# Patient Record
Sex: Female | Born: 1950 | Race: White | Hispanic: No | Smoking: Never smoker
Health system: Southern US, Community
[De-identification: ages and names within clinical notes are randomized; demographics above are authoritative.]

## PROBLEM LIST (undated history)

## (undated) ENCOUNTER — Emergency Department: Payer: Self-pay | Source: Ambulatory Visit | Admitting: Emergency Medicine

## (undated) DIAGNOSIS — E559 Vitamin D deficiency, unspecified: Secondary | ICD-10-CM

## (undated) DIAGNOSIS — F419 Anxiety disorder, unspecified: Secondary | ICD-10-CM

## (undated) DIAGNOSIS — G4730 Sleep apnea, unspecified: Secondary | ICD-10-CM

## (undated) DIAGNOSIS — S82892A Other fracture of left lower leg, initial encounter for closed fracture: Secondary | ICD-10-CM

## (undated) DIAGNOSIS — R7989 Other specified abnormal findings of blood chemistry: Secondary | ICD-10-CM

## (undated) DIAGNOSIS — G43909 Migraine, unspecified, not intractable, without status migrainosus: Secondary | ICD-10-CM

## (undated) DIAGNOSIS — Z8719 Personal history of other diseases of the digestive system: Secondary | ICD-10-CM

## (undated) DIAGNOSIS — I499 Cardiac arrhythmia, unspecified: Secondary | ICD-10-CM

## (undated) DIAGNOSIS — N390 Urinary tract infection, site not specified: Secondary | ICD-10-CM

## (undated) DIAGNOSIS — I10 Essential (primary) hypertension: Secondary | ICD-10-CM

## (undated) DIAGNOSIS — K760 Fatty (change of) liver, not elsewhere classified: Secondary | ICD-10-CM

## (undated) DIAGNOSIS — F329 Major depressive disorder, single episode, unspecified: Secondary | ICD-10-CM

## (undated) DIAGNOSIS — D649 Anemia, unspecified: Secondary | ICD-10-CM

## (undated) DIAGNOSIS — Z5189 Encounter for other specified aftercare: Secondary | ICD-10-CM

## (undated) DIAGNOSIS — I341 Nonrheumatic mitral (valve) prolapse: Secondary | ICD-10-CM

## (undated) DIAGNOSIS — J45909 Unspecified asthma, uncomplicated: Secondary | ICD-10-CM

## (undated) DIAGNOSIS — F32A Depression, unspecified: Secondary | ICD-10-CM

## (undated) DIAGNOSIS — R079 Chest pain, unspecified: Secondary | ICD-10-CM

## (undated) DIAGNOSIS — M1990 Unspecified osteoarthritis, unspecified site: Secondary | ICD-10-CM

## (undated) DIAGNOSIS — Z973 Presence of spectacles and contact lenses: Secondary | ICD-10-CM

## (undated) DIAGNOSIS — T8092XA Unspecified transfusion reaction, initial encounter: Secondary | ICD-10-CM

## (undated) DIAGNOSIS — M5126 Other intervertebral disc displacement, lumbar region: Secondary | ICD-10-CM

## (undated) DIAGNOSIS — R112 Nausea with vomiting, unspecified: Secondary | ICD-10-CM

## (undated) DIAGNOSIS — T7840XA Allergy, unspecified, initial encounter: Secondary | ICD-10-CM

## (undated) DIAGNOSIS — R002 Palpitations: Secondary | ICD-10-CM

## (undated) DIAGNOSIS — M2550 Pain in unspecified joint: Secondary | ICD-10-CM

## (undated) DIAGNOSIS — R0602 Shortness of breath: Secondary | ICD-10-CM

## (undated) DIAGNOSIS — J449 Chronic obstructive pulmonary disease, unspecified: Secondary | ICD-10-CM

## (undated) DIAGNOSIS — I671 Cerebral aneurysm, nonruptured: Secondary | ICD-10-CM

## (undated) DIAGNOSIS — Z9889 Other specified postprocedural states: Secondary | ICD-10-CM

## (undated) DIAGNOSIS — E538 Deficiency of other specified B group vitamins: Secondary | ICD-10-CM

## (undated) DIAGNOSIS — G4733 Obstructive sleep apnea (adult) (pediatric): Secondary | ICD-10-CM

## (undated) DIAGNOSIS — E785 Hyperlipidemia, unspecified: Secondary | ICD-10-CM

## (undated) DIAGNOSIS — R51 Headache: Secondary | ICD-10-CM

## (undated) DIAGNOSIS — R42 Dizziness and giddiness: Secondary | ICD-10-CM

## (undated) DIAGNOSIS — K219 Gastro-esophageal reflux disease without esophagitis: Secondary | ICD-10-CM

## (undated) DIAGNOSIS — K8020 Calculus of gallbladder without cholecystitis without obstruction: Secondary | ICD-10-CM

## (undated) DIAGNOSIS — I2510 Atherosclerotic heart disease of native coronary artery without angina pectoris: Secondary | ICD-10-CM

## (undated) DIAGNOSIS — M549 Dorsalgia, unspecified: Secondary | ICD-10-CM

## (undated) HISTORY — DX: Dorsalgia, unspecified: M54.9

## (undated) HISTORY — DX: Urinary tract infection, site not specified: N39.0

## (undated) HISTORY — DX: Gastro-esophageal reflux disease without esophagitis: K21.9

## (undated) HISTORY — DX: Unspecified osteoarthritis, unspecified site: M19.90

## (undated) HISTORY — DX: Chronic obstructive pulmonary disease, unspecified: J44.9

## (undated) HISTORY — DX: Anemia, unspecified: D64.9

## (undated) HISTORY — DX: Cerebral aneurysm, nonruptured: I67.1

## (undated) HISTORY — DX: Palpitations: R00.2

## (undated) HISTORY — DX: Anxiety disorder, unspecified: F41.9

## (undated) HISTORY — DX: Headache: R51

## (undated) HISTORY — DX: Allergy, unspecified, initial encounter: T78.40XA

## (undated) HISTORY — DX: Pain in unspecified joint: M25.50

## (undated) HISTORY — PX: JOINT REPLACEMENT: SHX530

## (undated) HISTORY — DX: Obstructive sleep apnea (adult) (pediatric): G47.33

## (undated) HISTORY — PX: TONSILLECTOMY AND ADENOIDECTOMY: SUR1326

## (undated) HISTORY — DX: Sleep apnea, unspecified: G47.30

## (undated) HISTORY — DX: Hyperlipidemia, unspecified: E78.5

## (undated) HISTORY — DX: Atherosclerotic heart disease of native coronary artery without angina pectoris: I25.10

## (undated) HISTORY — PX: TOTAL KNEE ARTHROPLASTY: SHX125

## (undated) HISTORY — DX: Essential (primary) hypertension: I10

## (undated) HISTORY — DX: Unspecified transfusion reaction, initial encounter: T80.92XA

## (undated) HISTORY — PX: POLYPECTOMY: SHX149

## (undated) HISTORY — DX: Nonrheumatic mitral (valve) prolapse: I34.1

## (undated) HISTORY — DX: Other specified abnormal findings of blood chemistry: R79.89

## (undated) HISTORY — DX: Encounter for other specified aftercare: Z51.89

## (undated) HISTORY — DX: Shortness of breath: R06.02

## (undated) HISTORY — DX: Unspecified asthma, uncomplicated: J45.909

## (undated) HISTORY — DX: Vitamin D deficiency, unspecified: E55.9

## (undated) HISTORY — DX: Depression, unspecified: F32.A

## (undated) HISTORY — DX: Deficiency of other specified B group vitamins: E53.8

## (undated) HISTORY — PX: CHOLECYSTECTOMY: SHX55

## (undated) HISTORY — DX: Chest pain, unspecified: R07.9

## (undated) HISTORY — DX: Migraine, unspecified, not intractable, without status migrainosus: G43.909

## (undated) HISTORY — PX: COLONOSCOPY: SHX174

---

## 1975-12-31 HISTORY — PX: APPENDECTOMY: SHX54

## 1976-05-30 HISTORY — PX: TUBAL LIGATION: SHX77

## 1978-12-30 HISTORY — PX: PAROTIDECTOMY: SUR1003

## 1998-04-04 ENCOUNTER — Other Ambulatory Visit: Admission: RE | Admit: 1998-04-04 | Discharge: 1998-04-04 | Payer: Self-pay | Admitting: *Deleted

## 1999-04-12 ENCOUNTER — Other Ambulatory Visit: Admission: RE | Admit: 1999-04-12 | Discharge: 1999-04-12 | Payer: Self-pay | Admitting: *Deleted

## 2000-04-23 ENCOUNTER — Other Ambulatory Visit: Admission: RE | Admit: 2000-04-23 | Discharge: 2000-04-23 | Payer: Self-pay | Admitting: *Deleted

## 2001-05-11 ENCOUNTER — Other Ambulatory Visit: Admission: RE | Admit: 2001-05-11 | Discharge: 2001-05-11 | Payer: Self-pay | Admitting: *Deleted

## 2002-07-12 ENCOUNTER — Other Ambulatory Visit: Admission: RE | Admit: 2002-07-12 | Discharge: 2002-07-12 | Payer: Self-pay | Admitting: *Deleted

## 2003-07-20 ENCOUNTER — Other Ambulatory Visit: Admission: RE | Admit: 2003-07-20 | Discharge: 2003-07-20 | Payer: Self-pay | Admitting: *Deleted

## 2004-08-06 ENCOUNTER — Other Ambulatory Visit: Admission: RE | Admit: 2004-08-06 | Discharge: 2004-08-06 | Payer: Self-pay | Admitting: *Deleted

## 2005-11-27 ENCOUNTER — Other Ambulatory Visit: Admission: RE | Admit: 2005-11-27 | Discharge: 2005-11-27 | Payer: Self-pay | Admitting: *Deleted

## 2007-10-07 ENCOUNTER — Other Ambulatory Visit: Admission: RE | Admit: 2007-10-07 | Discharge: 2007-10-07 | Payer: Self-pay | Admitting: *Deleted

## 2008-05-26 ENCOUNTER — Ambulatory Visit: Payer: Self-pay

## 2008-06-14 ENCOUNTER — Ambulatory Visit: Payer: Self-pay | Admitting: *Deleted

## 2008-06-22 ENCOUNTER — Ambulatory Visit: Payer: Self-pay | Admitting: Gynecology

## 2009-06-12 ENCOUNTER — Ambulatory Visit: Payer: Self-pay | Admitting: Specialist

## 2009-10-17 ENCOUNTER — Ambulatory Visit: Payer: Self-pay | Admitting: Gynecology

## 2009-11-22 ENCOUNTER — Ambulatory Visit: Payer: Self-pay | Admitting: General Practice

## 2010-02-09 ENCOUNTER — Ambulatory Visit: Payer: Self-pay | Admitting: Unknown Physician Specialty

## 2010-07-09 LAB — HM COLONOSCOPY

## 2010-11-07 ENCOUNTER — Ambulatory Visit: Payer: Self-pay | Admitting: Gynecology

## 2011-01-22 ENCOUNTER — Ambulatory Visit: Payer: Self-pay | Admitting: Urology

## 2011-04-26 ENCOUNTER — Ambulatory Visit: Payer: Self-pay | Admitting: Unknown Physician Specialty

## 2011-08-26 ENCOUNTER — Ambulatory Visit: Payer: Self-pay | Admitting: General Practice

## 2011-08-31 ENCOUNTER — Ambulatory Visit: Payer: Self-pay | Admitting: Internal Medicine

## 2011-09-19 ENCOUNTER — Ambulatory Visit: Payer: Self-pay | Admitting: Internal Medicine

## 2011-09-30 ENCOUNTER — Ambulatory Visit: Payer: Self-pay | Admitting: Internal Medicine

## 2011-09-30 HISTORY — PX: TOTAL KNEE ARTHROPLASTY: SHX125

## 2011-10-09 ENCOUNTER — Inpatient Hospital Stay: Payer: Self-pay | Admitting: General Practice

## 2011-10-25 ENCOUNTER — Encounter: Payer: Self-pay | Admitting: General Practice

## 2011-10-30 ENCOUNTER — Other Ambulatory Visit: Payer: Self-pay | Admitting: Unknown Physician Specialty

## 2011-10-31 ENCOUNTER — Encounter: Payer: Self-pay | Admitting: General Practice

## 2011-12-06 ENCOUNTER — Ambulatory Visit: Payer: Self-pay | Admitting: Unknown Physician Specialty

## 2011-12-18 ENCOUNTER — Other Ambulatory Visit: Payer: Self-pay | Admitting: Unknown Physician Specialty

## 2012-01-21 ENCOUNTER — Ambulatory Visit: Payer: Self-pay | Admitting: Internal Medicine

## 2012-01-31 ENCOUNTER — Ambulatory Visit: Payer: Self-pay | Admitting: Internal Medicine

## 2012-02-10 ENCOUNTER — Ambulatory Visit: Payer: Self-pay | Admitting: Gynecology

## 2012-02-10 LAB — HM MAMMOGRAPHY: HM Mammogram: NORMAL

## 2012-02-28 ENCOUNTER — Other Ambulatory Visit: Payer: Self-pay | Admitting: Internal Medicine

## 2012-02-28 LAB — BASIC METABOLIC PANEL
Anion Gap: 8 (ref 7–16)
BUN: 16 mg/dL (ref 7–18)
Calcium, Total: 9.4 mg/dL (ref 8.5–10.1)
Chloride: 101 mmol/L (ref 98–107)
Co2: 30 mmol/L (ref 21–32)
Creatinine: 0.7 mg/dL (ref 0.60–1.30)
EGFR (African American): >60
EGFR (Non-African Amer.): >60
Glucose: 103 mg/dL — ABNORMAL HIGH (ref 65–99)
Osmolality: 279 (ref 275–301)
Potassium: 4 mmol/L (ref 3.5–5.1)
Sodium: 139 mmol/L (ref 136–145)

## 2012-02-28 LAB — RENAL FUNCTION PANEL
Albumin: 3.6 g/dL (ref 3.4–5.0)
Phosphorus: 3.5 mg/dL (ref 2.5–4.9)

## 2012-06-15 ENCOUNTER — Emergency Department: Payer: Self-pay | Admitting: *Deleted

## 2012-06-15 LAB — COMPREHENSIVE METABOLIC PANEL
Albumin: 4 g/dL (ref 3.4–5.0)
Alkaline Phosphatase: 56 U/L (ref 50–136)
Anion Gap: 9 (ref 7–16)
BUN: 21 mg/dL — ABNORMAL HIGH (ref 7–18)
Bilirubin,Total: 0.8 mg/dL (ref 0.2–1.0)
Calcium, Total: 9.7 mg/dL (ref 8.5–10.1)
Chloride: 102 mmol/L (ref 98–107)
Co2: 27 mmol/L (ref 21–32)
Creatinine: 0.66 mg/dL (ref 0.60–1.30)
EGFR (African American): >60
EGFR (Non-African Amer.): >60
Glucose: 142 mg/dL — ABNORMAL HIGH (ref 65–99)
Osmolality: 281 (ref 275–301)
Potassium: 4.1 mmol/L (ref 3.5–5.1)
SGOT(AST): 38 U/L — ABNORMAL HIGH (ref 15–37)
SGPT (ALT): 40 U/L
Sodium: 138 mmol/L (ref 136–145)
Total Protein: 7.7 g/dL (ref 6.4–8.2)

## 2012-06-15 LAB — URINALYSIS, COMPLETE
Bilirubin,UR: NEGATIVE
Glucose,UR: NEGATIVE mg/dL (ref 0–75)
Ketone: NEGATIVE
Nitrite: POSITIVE
Ph: 6 (ref 4.5–8.0)
Protein: 30
RBC,UR: 15 /HPF (ref 0–5)
Specific Gravity: 1.021 (ref 1.003–1.030)
Squamous Epithelial: 1
WBC UR: 50 /HPF (ref 0–5)

## 2012-06-15 LAB — CBC
HCT: 36.2 % (ref 35.0–47.0)
HGB: 11.6 g/dL — ABNORMAL LOW (ref 12.0–16.0)
MCH: 29.2 pg (ref 26.0–34.0)
MCHC: 31.9 g/dL — ABNORMAL LOW (ref 32.0–36.0)
MCV: 92 fL (ref 80–100)
Platelet: 275 10*3/uL (ref 150–440)
RBC: 3.96 10*6/uL (ref 3.80–5.20)
RDW: 15.2 % — ABNORMAL HIGH (ref 11.5–14.5)
WBC: 9.1 10*3/uL (ref 3.6–11.0)

## 2012-06-15 LAB — HEMOGLOBIN A1C: Hemoglobin A1C: 7 % — ABNORMAL HIGH (ref 4.2–6.3)

## 2012-07-09 ENCOUNTER — Encounter: Payer: Self-pay | Admitting: Internal Medicine

## 2012-07-09 ENCOUNTER — Ambulatory Visit (INDEPENDENT_AMBULATORY_CARE_PROVIDER_SITE_OTHER): Payer: PRIVATE HEALTH INSURANCE | Admitting: Internal Medicine

## 2012-07-09 VITALS — BP 140/72 | HR 81 | Temp 98.1°F | Ht 65.0 in | Wt 222.2 lb

## 2012-07-09 DIAGNOSIS — E785 Hyperlipidemia, unspecified: Secondary | ICD-10-CM

## 2012-07-09 DIAGNOSIS — E6609 Other obesity due to excess calories: Secondary | ICD-10-CM | POA: Insufficient documentation

## 2012-07-09 DIAGNOSIS — I10 Essential (primary) hypertension: Secondary | ICD-10-CM

## 2012-07-09 DIAGNOSIS — J45909 Unspecified asthma, uncomplicated: Secondary | ICD-10-CM

## 2012-07-09 DIAGNOSIS — E119 Type 2 diabetes mellitus without complications: Secondary | ICD-10-CM

## 2012-07-09 DIAGNOSIS — E669 Obesity, unspecified: Secondary | ICD-10-CM

## 2012-07-09 LAB — HM PAP SMEAR

## 2012-07-09 NOTE — Assessment & Plan Note (Signed)
Blood sugars well-controlled per patient. Will continue current medications. Will get records from endocrinologist. Encouraged her to continue efforts at healthy diet and regular physical activity.

## 2012-07-09 NOTE — Assessment & Plan Note (Signed)
Symptomatically well-controlled. Will continue Advair and albuterol when necessary.

## 2012-07-09 NOTE — Progress Notes (Signed)
Subjective:    Patient ID: Vanessa Higgins, female    DOB: Jan 05, 1951, 61 y.o.   MRN: [id number]  HPI 61 year old female with history of hypertension, diabetes, and hyperlipidemia presents to establish care. She reports that she is generally feeling well. In regards to her diabetes, she reports typical fasting blood sugars are near 120. She never has blood sugars greater than 200. Her last hemoglobin A1c was 6.9%. She is followed by endocrinology. In regards to hypertension, she reports full compliance with her medications. She denies any headache, chest pain, palpitations.  She does have a history of asthma for which she is followed by pulmonary medicine. She reports that her asthma symptoms have been very well-controlled with the use of Advair. She has not had any recent flares.  Outpatient Encounter Prescriptions as of 07/09/2012  Medication Sig Dispense Refill  . aspirin 81 MG tablet Take 81 mg by mouth daily.      Marland Kitchen b complex vitamins tablet Take 1 tablet by mouth daily.      . Exenatide (BYDUREON) 2 MG SUSR Inject 2 mg into the skin once a week.      . ezetimibe-simvastatin (VYTORIN) 10-20 MG per tablet Take 1 tablet by mouth at bedtime.      . Fluticasone-Salmeterol (ADVAIR) 250-50 MCG/DOSE AEPB Inhale 1 puff into the lungs 2 (two) times daily.      Marland Kitchen loratadine (CLARITIN) 10 MG tablet Take 10 mg by mouth daily.      . metFORMIN (GLUCOPHAGE) 1000 MG tablet Take 1,000 mg by mouth 2 (two) times daily with a meal.      . montelukast (SINGULAIR) 10 MG tablet Take 10 mg by mouth daily.      . Multiple Vitamins-Minerals (CENTRUM SILVER PO) Take 1 tablet by mouth daily.      . Omega-3 Fatty Acids (FISH OIL) 1200 MG CAPS Take 1 capsule by mouth daily.      Marland Kitchen omeprazole (PRILOSEC) 20 MG capsule Take 20 mg by mouth daily.      Marland Kitchen telmisartan-hydrochlorothiazide (MICARDIS HCT) 80-12.5 MG per tablet Take 1 tablet by mouth daily.      Marland Kitchen venlafaxine XR (EFFEXOR-XR) 150 MG 24 hr capsule Take 150 mg by mouth  daily.      . Verapamil HCl CR 300 MG CP24 Take 1 capsule by mouth at bedtime.        Review of Systems  Constitutional: Negative for fever, chills, appetite change, fatigue and unexpected weight change.  HENT: Negative for ear pain, congestion, sore throat, trouble swallowing, neck pain, voice change and sinus pressure.   Eyes: Negative for visual disturbance.  Respiratory: Negative for cough, shortness of breath, wheezing and stridor.   Cardiovascular: Negative for chest pain, palpitations and leg swelling.  Gastrointestinal: Negative for nausea, vomiting, abdominal pain, diarrhea, constipation, blood in stool, abdominal distention and anal bleeding.  Genitourinary: Negative for dysuria and flank pain.  Musculoskeletal: Negative for myalgias, arthralgias and gait problem.  Skin: Negative for color change and rash.  Neurological: Negative for dizziness and headaches.  Hematological: Negative for adenopathy. Does not bruise/bleed easily.  Psychiatric/Behavioral: Negative for suicidal ideas, disturbed wake/sleep cycle and dysphoric mood. The patient is not nervous/anxious.    BP 140/72  Pulse 81  Temp 98.1 F (36.7 C) (Oral)  Ht 5\' 5"  (1.651 m)  Wt 222 lb 4 oz (100.812 kg)  BMI 36.98 kg/m2  SpO2 96%     Objective:   Physical Exam  Constitutional: She is oriented to person,  place, and time. She appears well-developed and well-nourished. No distress.  HENT:  Head: Normocephalic and atraumatic.  Right Ear: External ear normal.  Left Ear: External ear normal.  Nose: Nose normal.  Mouth/Throat: Oropharynx is clear and moist. No oropharyngeal exudate.  Eyes: Conjunctivae are normal. Pupils are equal, round, and reactive to light. Right eye exhibits no discharge. Left eye exhibits no discharge. No scleral icterus.  Neck: Normal range of motion. Neck supple. No tracheal deviation present. No thyromegaly present.  Cardiovascular: Normal rate, regular rhythm, normal heart sounds and  intact distal pulses.  Exam reveals no gallop and no friction rub.   No murmur heard. Pulmonary/Chest: Effort normal and breath sounds normal. No respiratory distress. She has no wheezes. She has no rales. She exhibits no tenderness.  Abdominal: Soft. Bowel sounds are normal. She exhibits no distension and no mass. There is no tenderness. There is no guarding.  Musculoskeletal: Normal range of motion. She exhibits no edema and no tenderness.  Lymphadenopathy:    She has no cervical adenopathy.  Neurological: She is alert and oriented to person, place, and time. No cranial nerve deficit. She exhibits normal muscle tone. Coordination normal.  Skin: Skin is warm and dry. No rash noted. She is not diaphoretic. No erythema. No pallor.  Psychiatric: She has a normal mood and affect. Her behavior is normal. Judgment and thought content normal.          Assessment & Plan:

## 2012-07-09 NOTE — Assessment & Plan Note (Signed)
Will get records on recent lipids performed by her endocrinologist.

## 2012-07-09 NOTE — Assessment & Plan Note (Signed)
BMI 36. Encourage patient to continue efforts at healthy diet and regular physical activity. She is currently working with a nutritionist. Followup here in 6 months.

## 2012-07-09 NOTE — Assessment & Plan Note (Signed)
Blood pressure well-controlled on current medications. Will continue current medicines. Will get records on recent blood work including renal function.

## 2012-10-28 ENCOUNTER — Other Ambulatory Visit: Payer: Self-pay | Admitting: Internal Medicine

## 2012-10-28 MED ORDER — VENLAFAXINE HCL ER 150 MG PO CP24: 150.0000 mg | ORAL_CAPSULE | Freq: Every day | ORAL | Status: DC

## 2012-10-28 MED ORDER — MONTELUKAST SODIUM 10 MG PO TABS: 10.0000 mg | ORAL_TABLET | Freq: Every day | ORAL | Status: DC

## 2012-10-28 NOTE — Telephone Encounter (Signed)
Refill request for montelukast 10 mg tab Qty: 90 Sig: take 1 tablet by mouth once a day   Venlafaxine hcl er 150 mg Qty 90 Sig: take 1 capsule by mouth once a day

## 2012-12-28 ENCOUNTER — Other Ambulatory Visit: Payer: Self-pay | Admitting: Internal Medicine

## 2012-12-28 LAB — LIPID PANEL
Cholesterol: 108 mg/dL (ref 0–200)
HDL Cholesterol: 44 mg/dL (ref 40–60)
Ldl Cholesterol, Calc: 47 mg/dL (ref 0–100)
Triglycerides: 85 mg/dL (ref 0–200)
VLDL Cholesterol, Calc: 17 mg/dL (ref 5–40)

## 2012-12-28 LAB — BASIC METABOLIC PANEL
Anion Gap: 9 (ref 7–16)
BUN: 16 mg/dL (ref 7–18)
Calcium, Total: 9.1 mg/dL (ref 8.5–10.1)
Chloride: 102 mmol/L (ref 98–107)
Co2: 27 mmol/L (ref 21–32)
Creatinine: 0.64 mg/dL (ref 0.60–1.30)
EGFR (African American): >60
EGFR (Non-African Amer.): >60
Glucose: 128 mg/dL — ABNORMAL HIGH (ref 65–99)
Osmolality: 279 (ref 275–301)
Potassium: 4.2 mmol/L (ref 3.5–5.1)
Sodium: 138 mmol/L (ref 136–145)

## 2013-02-02 LAB — HM MAMMOGRAPHY: HM Mammogram: NORMAL

## 2013-02-13 ENCOUNTER — Other Ambulatory Visit: Payer: Self-pay

## 2013-03-08 ENCOUNTER — Ambulatory Visit: Payer: Self-pay | Admitting: Gynecology

## 2013-03-08 LAB — HM MAMMOGRAPHY: HM Mammogram: NORMAL

## 2013-04-08 ENCOUNTER — Telehealth: Payer: Self-pay | Admitting: Internal Medicine

## 2013-04-08 NOTE — Telephone Encounter (Signed)
Dr. Tilman Neat patient.  Her A1c has risen slightly from 6.9 to 7.2   She is long overdue for appt to follow up on DM with Dr Dan Humphreys. Make appt.

## 2013-04-09 NOTE — Telephone Encounter (Signed)
Detailed message left on voicemail and advised patient to call office for an appointment.

## 2013-05-03 ENCOUNTER — Ambulatory Visit (INDEPENDENT_AMBULATORY_CARE_PROVIDER_SITE_OTHER): Payer: 59 | Admitting: Internal Medicine

## 2013-05-03 ENCOUNTER — Encounter: Payer: Self-pay | Admitting: Internal Medicine

## 2013-05-03 VITALS — BP 110/70 | HR 80 | Temp 98.3°F | Ht 63.5 in | Wt 222.0 lb

## 2013-05-03 DIAGNOSIS — E785 Hyperlipidemia, unspecified: Secondary | ICD-10-CM

## 2013-05-03 DIAGNOSIS — E669 Obesity, unspecified: Secondary | ICD-10-CM

## 2013-05-03 DIAGNOSIS — L237 Allergic contact dermatitis due to plants, except food: Secondary | ICD-10-CM

## 2013-05-03 DIAGNOSIS — Z0000 Encounter for general adult medical examination without abnormal findings: Secondary | ICD-10-CM

## 2013-05-03 DIAGNOSIS — I10 Essential (primary) hypertension: Secondary | ICD-10-CM

## 2013-05-03 DIAGNOSIS — L255 Unspecified contact dermatitis due to plants, except food: Secondary | ICD-10-CM

## 2013-05-03 DIAGNOSIS — E119 Type 2 diabetes mellitus without complications: Secondary | ICD-10-CM

## 2013-05-03 MED ORDER — ZOSTER VACCINE LIVE 19400 UNT/0.65ML ~~LOC~~ SOLR: 0.6500 mL | Freq: Once | SUBCUTANEOUS | Status: DC

## 2013-05-03 NOTE — Assessment & Plan Note (Signed)
Will check lipids and LFTs with labs. 

## 2013-05-03 NOTE — Assessment & Plan Note (Signed)
Exam is consistent with dermatitis secondary to poison oak exposure over her chest and face. We discussed potentially using oral prednisone, however because she has diabetes we'll hold off and continue topical hydrocortisone. Patient will call if symptoms are not improving.

## 2013-05-03 NOTE — Assessment & Plan Note (Signed)
Reviewed recent hemoglobin A1c which was 7.2%. Encourage continued efforts at healthy diet and regular physical activity. Continue current medication. Followup with endocrinology as scheduled.

## 2013-05-03 NOTE — Progress Notes (Signed)
Subjective:    Patient ID: Vanessa Higgins, female    DOB: [birth year], 62 y.o.   MRN: [id number]  HPI 62 year old female with history of diabetes, hypertension, hyperlipidemia, obesity, asthma presents for annual exam. She has been lost to followup since July 2013. She reports she is generally feeling well. She brings record on hemoglobin A1c from her endocrinologist which was 7.1% in March 2014. She reports compliance with her medication. She denies any low blood sugar or consistent blood sugars greater than 200.  She also brings record of recent mammogram report from March 2014 which was normal. She reports she recently underwent pelvic and Pap smear with her gynecologist which was normal. She is up-to-date on immunizations. She is up-to-date on colonoscopy. She is trying to follow a healthy diet and regular physical activity. She continues to work full-time.  She was recently working in her yard and was exposed to poison oak. She has a red, itchy rash over her anterior chest and face. She has been applying topical hydrocortisone with some improvement.  Outpatient Encounter Prescriptions as of 05/03/2013  Medication Sig Dispense Refill  . aspirin 81 MG tablet Take 81 mg by mouth daily.      Marland Kitchen b complex vitamins tablet Take 1 tablet by mouth daily.      . Exenatide (BYDUREON) 2 MG SUSR Inject 2 mg into the skin once a week.      . ezetimibe-simvastatin (VYTORIN) 10-20 MG per tablet Take 1 tablet by mouth at bedtime.      . Fluticasone-Salmeterol (ADVAIR) 250-50 MCG/DOSE AEPB Inhale 1 puff into the lungs 2 (two) times daily.      Marland Kitchen loratadine (CLARITIN) 10 MG tablet Take 10 mg by mouth daily.      . metFORMIN (GLUCOPHAGE) 1000 MG tablet Take 1,000 mg by mouth 2 (two) times daily with a meal.      . montelukast (SINGULAIR) 10 MG tablet Take 1 tablet (10 mg total) by mouth daily.  30 tablet  11  . Multiple Vitamins-Minerals (CENTRUM SILVER PO) Take 1 tablet by mouth daily.      Marland Kitchen omeprazole  (PRILOSEC) 20 MG capsule Take 20 mg by mouth daily.      Marland Kitchen telmisartan-hydrochlorothiazide (MICARDIS HCT) 80-12.5 MG per tablet Take 1 tablet by mouth daily.      Marland Kitchen venlafaxine XR (EFFEXOR-XR) 150 MG 24 hr capsule Take 1 capsule (150 mg total) by mouth daily.  30 capsule  6  . Verapamil HCl CR 300 MG CP24 Take 1 capsule by mouth at bedtime.      . Omega-3 Fatty Acids (FISH OIL) 1200 MG CAPS Take 1 capsule by mouth daily.      Marland Kitchen zoster vaccine live, PF, (ZOSTAVAX) 14782 UNT/0.65ML injection Inject 19,400 Units into the skin once.  1 each  0   No facility-administered encounter medications on file as of 05/03/2013.   BP 110/70  Pulse 80  Temp(Src) 98.3 F (36.8 C) (Oral)  Ht 5' 3.5" (1.613 m)  Wt 222 lb (100.699 kg)  BMI 38.7 kg/m2  SpO2 94%  Review of Systems  Constitutional: Negative for fever, chills, appetite change, fatigue and unexpected weight change.  HENT: Negative for ear pain, congestion, sore throat, trouble swallowing, neck pain, voice change and sinus pressure.   Eyes: Negative for visual disturbance.  Respiratory: Negative for cough, shortness of breath, wheezing and stridor.   Cardiovascular: Negative for chest pain, palpitations and leg swelling.  Gastrointestinal: Negative for nausea, vomiting, abdominal pain, diarrhea,  constipation, blood in stool, abdominal distention and anal bleeding.  Genitourinary: Negative for dysuria and flank pain.  Musculoskeletal: Negative for myalgias, arthralgias and gait problem.  Skin: Positive for color change and rash.  Neurological: Negative for dizziness and headaches.  Hematological: Negative for adenopathy. Does not bruise/bleed easily.  Psychiatric/Behavioral: Negative for suicidal ideas, sleep disturbance and dysphoric mood. The patient is not nervous/anxious.        Objective:   Physical Exam  Constitutional: She is oriented to person, place, and time. She appears well-developed and well-nourished. No distress.  HENT:  Head:  Normocephalic and atraumatic.  Right Ear: External ear normal.  Left Ear: External ear normal.  Nose: Nose normal.  Mouth/Throat: Oropharynx is clear and moist. No oropharyngeal exudate.  Eyes: Conjunctivae are normal. Pupils are equal, round, and reactive to light. Right eye exhibits no discharge. Left eye exhibits no discharge. No scleral icterus.  Neck: Normal range of motion. Neck supple. No tracheal deviation present. No thyromegaly present.  Cardiovascular: Normal rate, regular rhythm, normal heart sounds and intact distal pulses.  Exam reveals no gallop and no friction rub.   No murmur heard. Pulmonary/Chest: Effort normal and breath sounds normal. No accessory muscle usage. Not tachypneic. No respiratory distress. She has no decreased breath sounds. She has no wheezes. She has no rhonchi. She has no rales. She exhibits no tenderness.  Abdominal: Soft. Bowel sounds are normal. She exhibits no distension. There is no tenderness.  Musculoskeletal: Normal range of motion. She exhibits no edema and no tenderness.  Lymphadenopathy:    She has no cervical adenopathy.  Neurological: She is alert and oriented to person, place, and time. No cranial nerve deficit. She exhibits normal muscle tone. Coordination normal.  Skin: Skin is warm and dry. Rash noted. She is not diaphoretic. There is erythema (erythematous vesicular rash over face and anterior chest). No pallor.  Psychiatric: She has a normal mood and affect. Her behavior is normal. Judgment and thought content normal.          Assessment & Plan:

## 2013-05-03 NOTE — Assessment & Plan Note (Signed)
Wt Readings from Last 3 Encounters:  05/03/13 222 lb (100.699 kg)  07/09/12 222 lb 4 oz (100.812 kg)   Encouraged compliance with healthy diet and regular physical activity.

## 2013-05-03 NOTE — Assessment & Plan Note (Signed)
General medical exam normal today. Breast exam and pelvic exam deferred as recently completed by her OB/GYN. Will request records on Pap smear. Encouraged healthy diet and regular physical activity. Reviewed recent mammogram which was normal. Immunizations are up to date. Colonoscopy is up-to-date. Followup in 6 months or sooner as needed.

## 2013-05-03 NOTE — Assessment & Plan Note (Signed)
BP Readings from Last 3 Encounters:  05/03/13 110/70  07/09/12 140/72   Blood pressure well-controlled on current medication. Will check renal function with labs.

## 2013-05-20 ENCOUNTER — Other Ambulatory Visit: Payer: Self-pay | Admitting: *Deleted

## 2013-05-21 MED ORDER — EXENATIDE ER 2 MG ~~LOC~~ SUSR: 2.0000 mg | SUBCUTANEOUS | Status: DC

## 2013-05-21 MED ORDER — FLUTICASONE-SALMETEROL 250-50 MCG/DOSE IN AEPB: 1.0000 | INHALATION_SPRAY | Freq: Two times a day (BID) | RESPIRATORY_TRACT | Status: DC

## 2013-05-21 MED ORDER — OMEPRAZOLE 20 MG PO CPDR: 20.0000 mg | DELAYED_RELEASE_CAPSULE | Freq: Every day | ORAL | Status: DC

## 2013-05-21 NOTE — Telephone Encounter (Signed)
Eprescribed.

## 2013-05-21 NOTE — Telephone Encounter (Signed)
Recent script 

## 2013-05-21 NOTE — Addendum Note (Signed)
Addended by: Dennie Bible on: 05/21/2013 09:40 AM   Modules accepted: Orders

## 2013-05-31 ENCOUNTER — Other Ambulatory Visit: Payer: Self-pay | Admitting: *Deleted

## 2013-05-31 MED ORDER — VENLAFAXINE HCL ER 150 MG PO CP24: 150.0000 mg | ORAL_CAPSULE | Freq: Every day | ORAL | Status: DC

## 2013-06-28 ENCOUNTER — Other Ambulatory Visit: Payer: Self-pay | Admitting: Internal Medicine

## 2013-06-28 LAB — CBC WITH DIFFERENTIAL/PLATELET
Basophil #: 0 10*3/uL (ref 0.0–0.1)
Basophil %: 0.5 %
Eosinophil #: 0.6 10*3/uL (ref 0.0–0.7)
Eosinophil %: 7.8 %
HCT: 37.7 % (ref 35.0–47.0)
HGB: 12.2 g/dL (ref 12.0–16.0)
Lymphocyte #: 2.6 10*3/uL (ref 1.0–3.6)
Lymphocyte %: 35.3 %
MCH: 28.8 pg (ref 26.0–34.0)
MCHC: 32.3 g/dL (ref 32.0–36.0)
MCV: 89 fL (ref 80–100)
Monocyte #: 0.7 x10 3/mm (ref 0.2–0.9)
Monocyte %: 9 %
Neutrophil #: 3.5 10*3/uL (ref 1.4–6.5)
Neutrophil %: 47.4 %
Platelet: 342 10*3/uL (ref 150–440)
RBC: 4.23 10*6/uL (ref 3.80–5.20)
RDW: 15.4 % — ABNORMAL HIGH (ref 11.5–14.5)
WBC: 7.4 10*3/uL (ref 3.6–11.0)

## 2013-06-28 LAB — LIPID PANEL
Cholesterol: 102 mg/dL (ref 0–200)
HDL Cholesterol: 44 mg/dL (ref 40–60)
Ldl Cholesterol, Calc: 42 mg/dL (ref 0–100)
Triglycerides: 81 mg/dL (ref 0–200)
VLDL Cholesterol, Calc: 16 mg/dL (ref 5–40)

## 2013-06-28 LAB — HEMOGLOBIN A1C: Hemoglobin A1C: 7.2 % — ABNORMAL HIGH (ref 4.2–6.3)

## 2013-06-28 LAB — TSH: Thyroid Stimulating Horm: 1.59 u[IU]/mL

## 2013-06-29 ENCOUNTER — Telehealth: Payer: Self-pay | Admitting: Internal Medicine

## 2013-06-29 NOTE — Telephone Encounter (Signed)
Labs from [facility] show stable blood sugars with A1c 7.2%. I would recommend follow up in 3 months.

## 2013-06-30 NOTE — Telephone Encounter (Signed)
Left message to call back  

## 2013-06-30 NOTE — Telephone Encounter (Signed)
Patient informed and verbally agreed understanding. Has an appointment in November.

## 2013-07-22 ENCOUNTER — Encounter: Payer: Self-pay | Admitting: Internal Medicine

## 2013-08-02 ENCOUNTER — Other Ambulatory Visit: Payer: Self-pay | Admitting: *Deleted

## 2013-08-02 MED ORDER — FLUTICASONE-SALMETEROL 250-50 MCG/DOSE IN AEPB: 1.0000 | INHALATION_SPRAY | Freq: Two times a day (BID) | RESPIRATORY_TRACT | Status: DC

## 2013-08-02 MED ORDER — EZETIMIBE-SIMVASTATIN 10-20 MG PO TABS: 1.0000 | ORAL_TABLET | Freq: Every day | ORAL | Status: DC

## 2013-08-02 NOTE — Telephone Encounter (Signed)
Eprescribed.

## 2013-08-19 ENCOUNTER — Other Ambulatory Visit: Payer: Self-pay | Admitting: *Deleted

## 2013-08-19 MED ORDER — VENLAFAXINE HCL ER 150 MG PO CP24: 150.0000 mg | ORAL_CAPSULE | Freq: Every day | ORAL | Status: DC

## 2013-08-19 MED ORDER — METFORMIN HCL 1000 MG PO TABS: 1000.0000 mg | ORAL_TABLET | Freq: Two times a day (BID) | ORAL | Status: DC

## 2013-08-19 MED ORDER — FLUTICASONE-SALMETEROL 250-50 MCG/DOSE IN AEPB: 1.0000 | INHALATION_SPRAY | Freq: Two times a day (BID) | RESPIRATORY_TRACT | Status: DC

## 2013-08-19 NOTE — Telephone Encounter (Signed)
Eprescribed.

## 2013-09-22 ENCOUNTER — Other Ambulatory Visit: Payer: Self-pay | Admitting: *Deleted

## 2013-09-22 MED ORDER — MONTELUKAST SODIUM 10 MG PO TABS: 10.0000 mg | ORAL_TABLET | Freq: Every day | ORAL | Status: DC

## 2013-11-03 ENCOUNTER — Ambulatory Visit: Payer: 59 | Admitting: Internal Medicine

## 2013-11-04 ENCOUNTER — Other Ambulatory Visit: Payer: Self-pay

## 2013-11-09 ENCOUNTER — Ambulatory Visit: Payer: 59 | Admitting: Internal Medicine

## 2013-11-15 ENCOUNTER — Other Ambulatory Visit: Payer: Self-pay | Admitting: *Deleted

## 2013-11-15 MED ORDER — FLUTICASONE-SALMETEROL 250-50 MCG/DOSE IN AEPB: 1.0000 | INHALATION_SPRAY | Freq: Two times a day (BID) | RESPIRATORY_TRACT | Status: DC

## 2013-11-24 ENCOUNTER — Other Ambulatory Visit: Payer: Self-pay | Admitting: *Deleted

## 2013-11-24 MED ORDER — TELMISARTAN-HCTZ 80-12.5 MG PO TABS: 1.0000 | ORAL_TABLET | Freq: Every day | ORAL | Status: DC

## 2013-11-24 MED ORDER — VERAPAMIL HCL ER 300 MG PO CP24: 1.0000 | ORAL_CAPSULE | Freq: Every day | ORAL | Status: DC

## 2013-12-03 ENCOUNTER — Ambulatory Visit: Payer: 59 | Admitting: Internal Medicine

## 2013-12-16 ENCOUNTER — Ambulatory Visit: Payer: 59 | Admitting: Internal Medicine

## 2013-12-20 ENCOUNTER — Other Ambulatory Visit: Payer: Self-pay | Admitting: Internal Medicine

## 2013-12-27 ENCOUNTER — Other Ambulatory Visit: Payer: Self-pay | Admitting: Internal Medicine

## 2014-01-02 LAB — HM PAP SMEAR

## 2014-01-26 ENCOUNTER — Other Ambulatory Visit: Payer: Self-pay | Admitting: Internal Medicine

## 2014-02-08 ENCOUNTER — Ambulatory Visit: Payer: 59 | Admitting: Internal Medicine

## 2014-02-17 ENCOUNTER — Ambulatory Visit: Payer: 59 | Admitting: Internal Medicine

## 2014-02-21 ENCOUNTER — Other Ambulatory Visit: Payer: Self-pay | Admitting: Internal Medicine

## 2014-03-09 ENCOUNTER — Ambulatory Visit: Payer: Self-pay | Admitting: Gynecology

## 2014-03-17 ENCOUNTER — Ambulatory Visit: Payer: 59 | Admitting: Internal Medicine

## 2014-03-18 ENCOUNTER — Ambulatory Visit: Payer: 59 | Admitting: Internal Medicine

## 2014-03-24 ENCOUNTER — Other Ambulatory Visit: Payer: Self-pay | Admitting: Internal Medicine

## 2014-04-04 ENCOUNTER — Encounter (HOSPITAL_BASED_OUTPATIENT_CLINIC_OR_DEPARTMENT_OTHER): Payer: Self-pay | Admitting: *Deleted

## 2014-04-04 NOTE — Progress Notes (Signed)
Pt had ekg-5/14-will need istat-not driving

## 2014-04-08 ENCOUNTER — Encounter (HOSPITAL_BASED_OUTPATIENT_CLINIC_OR_DEPARTMENT_OTHER): Payer: 59 | Admitting: Anesthesiology

## 2014-04-08 ENCOUNTER — Ambulatory Visit (HOSPITAL_BASED_OUTPATIENT_CLINIC_OR_DEPARTMENT_OTHER): Payer: 59 | Admitting: Anesthesiology

## 2014-04-08 ENCOUNTER — Encounter (HOSPITAL_BASED_OUTPATIENT_CLINIC_OR_DEPARTMENT_OTHER)
Admission: RE | Disposition: A | Discharge status: Home / Self Care | Payer: Self-pay | Source: Ambulatory Visit | Attending: Orthopedic Surgery

## 2014-04-08 ENCOUNTER — Encounter (HOSPITAL_BASED_OUTPATIENT_CLINIC_OR_DEPARTMENT_OTHER): Payer: Self-pay | Admitting: Anesthesiology

## 2014-04-08 ENCOUNTER — Ambulatory Visit (HOSPITAL_BASED_OUTPATIENT_CLINIC_OR_DEPARTMENT_OTHER)
Admission: RE | Admit: 2014-04-08 | Discharge: 2014-04-08 | Disposition: A | Discharge status: Home / Self Care | Payer: 59 | Source: Ambulatory Visit | Attending: Orthopedic Surgery | Admitting: Orthopedic Surgery

## 2014-04-08 DIAGNOSIS — M129 Arthropathy, unspecified: Secondary | ICD-10-CM | POA: Insufficient documentation

## 2014-04-08 DIAGNOSIS — W19XXXA Unspecified fall, initial encounter: Secondary | ICD-10-CM | POA: Insufficient documentation

## 2014-04-08 DIAGNOSIS — Z7982 Long term (current) use of aspirin: Secondary | ICD-10-CM | POA: Insufficient documentation

## 2014-04-08 DIAGNOSIS — Y92009 Unspecified place in unspecified non-institutional (private) residence as the place of occurrence of the external cause: Secondary | ICD-10-CM | POA: Insufficient documentation

## 2014-04-08 DIAGNOSIS — Z96659 Presence of unspecified artificial knee joint: Secondary | ICD-10-CM | POA: Insufficient documentation

## 2014-04-08 DIAGNOSIS — I10 Essential (primary) hypertension: Secondary | ICD-10-CM | POA: Insufficient documentation

## 2014-04-08 DIAGNOSIS — S82853A Displaced trimalleolar fracture of unspecified lower leg, initial encounter for closed fracture: Secondary | ICD-10-CM | POA: Insufficient documentation

## 2014-04-08 DIAGNOSIS — K219 Gastro-esophageal reflux disease without esophagitis: Secondary | ICD-10-CM | POA: Insufficient documentation

## 2014-04-08 DIAGNOSIS — E119 Type 2 diabetes mellitus without complications: Secondary | ICD-10-CM | POA: Insufficient documentation

## 2014-04-08 DIAGNOSIS — S82892A Other fracture of left lower leg, initial encounter for closed fracture: Secondary | ICD-10-CM | POA: Diagnosis present

## 2014-04-08 DIAGNOSIS — G43909 Migraine, unspecified, not intractable, without status migrainosus: Secondary | ICD-10-CM | POA: Insufficient documentation

## 2014-04-08 DIAGNOSIS — J45909 Unspecified asthma, uncomplicated: Secondary | ICD-10-CM | POA: Insufficient documentation

## 2014-04-08 DIAGNOSIS — E785 Hyperlipidemia, unspecified: Secondary | ICD-10-CM | POA: Insufficient documentation

## 2014-04-08 DIAGNOSIS — M2629 Other anomalies of dental arch relationship: Secondary | ICD-10-CM | POA: Insufficient documentation

## 2014-04-08 HISTORY — DX: Other fracture of left lower leg, initial encounter for closed fracture: S82.892A

## 2014-04-08 HISTORY — PX: ORIF ANKLE FRACTURE: SHX5408

## 2014-04-08 HISTORY — DX: Presence of spectacles and contact lenses: Z97.3

## 2014-04-08 LAB — GLUCOSE, CAPILLARY: Glucose-Capillary: 147 mg/dL — ABNORMAL HIGH (ref 70–99)

## 2014-04-08 LAB — POCT I-STAT, CHEM 8
BUN: 22 mg/dL (ref 6–23)
Calcium, Ion: 1.24 mmol/L (ref 1.13–1.30)
Chloride: 104 meq/L (ref 96–112)
Creatinine, Ser: 0.7 mg/dL (ref 0.50–1.10)
Glucose, Bld: 121 mg/dL — ABNORMAL HIGH (ref 70–99)
HCT: 44 % (ref 36.0–46.0)
Hemoglobin: 15 g/dL (ref 12.0–15.0)
Potassium: 4.2 meq/L (ref 3.7–5.3)
Sodium: 143 meq/L (ref 137–147)
TCO2: 24 mmol/L (ref 0–100)

## 2014-04-08 SURGERY — OPEN REDUCTION INTERNAL FIXATION (ORIF) ANKLE FRACTURE
Anesthesia: General | Site: Ankle | Laterality: Left

## 2014-04-08 MED ORDER — SUCCINYLCHOLINE CHLORIDE 20 MG/ML IJ SOLN
INTRAMUSCULAR | Status: DC | PRN
  Administered 2014-04-08: 100 mg via INTRAVENOUS

## 2014-04-08 MED ORDER — MIDAZOLAM HCL 2 MG/2ML IJ SOLN
1.0000 mg | INTRAMUSCULAR | Status: DC | PRN
  Administered 2014-04-08: 2 mg via INTRAVENOUS

## 2014-04-08 MED ORDER — PROPOFOL 10 MG/ML IV BOLUS
INTRAVENOUS | Status: DC | PRN
  Administered 2014-04-08: 150 mg via INTRAVENOUS

## 2014-04-08 MED ORDER — HYDROMORPHONE HCL PF 1 MG/ML IJ SOLN
0.2500 mg | INTRAMUSCULAR | Status: DC | PRN
  Administered 2014-04-08: 0.5 mg via INTRAVENOUS

## 2014-04-08 MED ORDER — FENTANYL CITRATE 0.05 MG/ML IJ SOLN
50.0000 ug | INTRAMUSCULAR | Status: DC | PRN
  Administered 2014-04-08: 100 ug via INTRAVENOUS

## 2014-04-08 MED ORDER — CEFAZOLIN SODIUM-DEXTROSE 2-3 GM-% IV SOLR
INTRAVENOUS | Status: AC
  Filled 2014-04-08: qty 50

## 2014-04-08 MED ORDER — BUPIVACAINE-EPINEPHRINE PF 0.5-1:200000 % IJ SOLN
INTRAMUSCULAR | Status: DC | PRN
  Administered 2014-04-08: 20 mL via PERINEURAL

## 2014-04-08 MED ORDER — OXYCODONE HCL 5 MG PO TABS: 5.0000 mg | ORAL_TABLET | Freq: Once | ORAL | Status: DC | PRN

## 2014-04-08 MED ORDER — FENTANYL CITRATE 0.05 MG/ML IJ SOLN
INTRAMUSCULAR | Status: AC
  Filled 2014-04-08: qty 2

## 2014-04-08 MED ORDER — FENTANYL CITRATE 0.05 MG/ML IJ SOLN
INTRAMUSCULAR | Status: DC | PRN
  Administered 2014-04-08: 50 ug via INTRAVENOUS

## 2014-04-08 MED ORDER — ONDANSETRON HCL 4 MG/2ML IJ SOLN
INTRAMUSCULAR | Status: DC | PRN
  Administered 2014-04-08: 4 mg via INTRAVENOUS

## 2014-04-08 MED ORDER — OXYCODONE-ACETAMINOPHEN 5-325 MG PO TABS: 1.0000 | ORAL_TABLET | Freq: Four times a day (QID) | ORAL | Status: DC | PRN

## 2014-04-08 MED ORDER — SENNA-DOCUSATE SODIUM 8.6-50 MG PO TABS: 2.0000 | ORAL_TABLET | Freq: Every day | ORAL | Status: DC

## 2014-04-08 MED ORDER — ROPIVACAINE HCL 5 MG/ML IJ SOLN
INTRAMUSCULAR | Status: DC | PRN
  Administered 2014-04-08: 20 mL via PERINEURAL

## 2014-04-08 MED ORDER — EPHEDRINE SULFATE 50 MG/ML IJ SOLN
INTRAMUSCULAR | Status: DC | PRN
  Administered 2014-04-08: 15 mg via INTRAVENOUS
  Administered 2014-04-08: 10 mg via INTRAVENOUS

## 2014-04-08 MED ORDER — LACTATED RINGERS IV SOLN
INTRAVENOUS | Status: DC
  Administered 2014-04-08 (×2): via INTRAVENOUS

## 2014-04-08 MED ORDER — OXYCODONE HCL 5 MG/5ML PO SOLN: 5.0000 mg | Freq: Once | ORAL | Status: DC | PRN

## 2014-04-08 MED ORDER — MIDAZOLAM HCL 2 MG/2ML IJ SOLN
INTRAMUSCULAR | Status: AC
  Filled 2014-04-08: qty 2

## 2014-04-08 MED ORDER — HYDROMORPHONE HCL PF 1 MG/ML IJ SOLN
INTRAMUSCULAR | Status: AC
  Filled 2014-04-08: qty 1

## 2014-04-08 MED ORDER — ONDANSETRON HCL 4 MG/2ML IJ SOLN: 4.0000 mg | Freq: Once | INTRAMUSCULAR | Status: DC | PRN

## 2014-04-08 MED ORDER — DEXAMETHASONE SODIUM PHOSPHATE 4 MG/ML IJ SOLN
INTRAMUSCULAR | Status: DC | PRN
  Administered 2014-04-08: 4 mg via INTRAVENOUS

## 2014-04-08 MED ORDER — ONDANSETRON HCL 4 MG PO TABS: 4.0000 mg | ORAL_TABLET | Freq: Three times a day (TID) | ORAL | Status: DC | PRN

## 2014-04-08 SURGICAL SUPPLY — 81 items
APL SKNCLS STERI-STRIP NONHPOA (GAUZE/BANDAGES/DRESSINGS) ×1
BANDAGE ELASTIC 4 VELCRO ST LF (GAUZE/BANDAGES/DRESSINGS) ×2 IMPLANT
BANDAGE ELASTIC 6 VELCRO ST LF (GAUZE/BANDAGES/DRESSINGS) ×2 IMPLANT
BANDAGE ESMARK 6X9 LF (GAUZE/BANDAGES/DRESSINGS) ×1 IMPLANT
BENZOIN TINCTURE PRP APPL 2/3 (GAUZE/BANDAGES/DRESSINGS) ×1 IMPLANT
BIT DRILL SOLID 2.5X110 (BIT) ×1 IMPLANT
BIT DRILL SOLID 3.5X110 (BIT) ×1 IMPLANT
BLADE SURG 15 STRL LF DISP TIS (BLADE) ×3 IMPLANT
BLADE SURG 15 STRL SS (BLADE) ×4
BNDG CMPR 9X6 STRL LF SNTH (GAUZE/BANDAGES/DRESSINGS) ×1
BNDG COHESIVE 4X5 TAN STRL (GAUZE/BANDAGES/DRESSINGS) ×2 IMPLANT
BNDG ESMARK 6X9 LF (GAUZE/BANDAGES/DRESSINGS) ×2
CANISTER SUCT 1200ML W/VALVE (MISCELLANEOUS) ×2 IMPLANT
COVER TABLE BACK 60X90 (DRAPES) ×2 IMPLANT
CUFF TOURNIQUET SINGLE 34IN LL (TOURNIQUET CUFF) ×1 IMPLANT
DECANTER SPIKE VIAL GLASS SM (MISCELLANEOUS) IMPLANT
DRAPE C-ARM 42X72 X-RAY (DRAPES) IMPLANT
DRAPE C-ARMOR (DRAPES) IMPLANT
DRAPE EXTREMITY T 121X128X90 (DRAPE) ×2 IMPLANT
DRAPE INCISE IOBAN 66X45 STRL (DRAPES) ×2 IMPLANT
DRAPE OEC MINIVIEW 54X84 (DRAPES) IMPLANT
DRAPE U 20/CS (DRAPES) ×2 IMPLANT
DRAPE U-SHAPE 47X51 STRL (DRAPES) ×2 IMPLANT
DRSG PAD ABDOMINAL 8X10 ST (GAUZE/BANDAGES/DRESSINGS) ×2 IMPLANT
DURAPREP 26ML APPLICATOR (WOUND CARE) ×2 IMPLANT
ELECT REM PT RETURN 9FT ADLT (ELECTROSURGICAL) ×2
ELECTRODE REM PT RTRN 9FT ADLT (ELECTROSURGICAL) ×1 IMPLANT
GLOVE BIO SURGEON STRL SZ7.5 (GLOVE) ×1 IMPLANT
GLOVE BIO SURGEON STRL SZ8 (GLOVE) ×2 IMPLANT
GLOVE BIOGEL PI IND STRL 7.0 (GLOVE) IMPLANT
GLOVE BIOGEL PI IND STRL 8 (GLOVE) ×2 IMPLANT
GLOVE BIOGEL PI INDICATOR 7.0 (GLOVE) ×2
GLOVE BIOGEL PI INDICATOR 8 (GLOVE) ×2
GLOVE ECLIPSE 6.5 STRL STRAW (GLOVE) ×2 IMPLANT
GLOVE ORTHO TXT STRL SZ7.5 (GLOVE) ×2 IMPLANT
GOWN STRL REUS W/ TWL LRG LVL3 (GOWN DISPOSABLE) ×1 IMPLANT
GOWN STRL REUS W/ TWL XL LVL3 (GOWN DISPOSABLE) ×2 IMPLANT
GOWN STRL REUS W/TWL LRG LVL3 (GOWN DISPOSABLE) ×2
GOWN STRL REUS W/TWL XL LVL3 (GOWN DISPOSABLE) ×4
KIT 1/3 TUB PL 5H 61M (Orthopedic Implant) IMPLANT
NDL HYPO 25X1 1.5 SAFETY (NEEDLE) IMPLANT
NEEDLE HYPO 25X1 1.5 SAFETY (NEEDLE) IMPLANT
NS IRRIG 1000ML POUR BTL (IV SOLUTION) ×2 IMPLANT
PACK BASIN DAY SURGERY FS (CUSTOM PROCEDURE TRAY) ×2 IMPLANT
PAD CAST 4YDX4 CTTN HI CHSV (CAST SUPPLIES) ×2 IMPLANT
PADDING CAST ABS 6INX4YD NS (CAST SUPPLIES) ×1
PADDING CAST ABS COTTON 6X4 NS (CAST SUPPLIES) IMPLANT
PADDING CAST COTTON 4X4 STRL (CAST SUPPLIES) ×4
PENCIL BUTTON HOLSTER BLD 10FT (ELECTRODE) ×2 IMPLANT
PROS 1/3 TUB PL 5H 61M (Orthopedic Implant) ×2 IMPLANT
SCREW CANC FT ST SFS 4X14 (Screw) ×1 IMPLANT
SCREW CANC FT ST SFS 4X16 (Screw) ×1 IMPLANT
SCREW CORTEX 3.5 12MM (Screw) ×1 IMPLANT
SCREW CORTEX 3.5 14MM (Screw) ×1 IMPLANT
SCREW CORTEX 3.5 22MM (Screw) ×1 IMPLANT
SCREW LOCK CORT ST 3.5X12 (Screw) IMPLANT
SCREW LOCK CORT ST 3.5X14 (Screw) IMPLANT
SCREW LOCK CORT ST 3.5X22 (Screw) IMPLANT
SHEET MEDIUM DRAPE 40X70 STRL (DRAPES) IMPLANT
SLEEVE SCD COMPRESS KNEE MED (MISCELLANEOUS) ×2 IMPLANT
SPLINT FAST PLASTER 5X30 (CAST SUPPLIES) ×20
SPLINT PLASTER CAST FAST 5X30 (CAST SUPPLIES) IMPLANT
SPONGE GAUZE 4X4 12PLY (GAUZE/BANDAGES/DRESSINGS) ×2 IMPLANT
SPONGE LAP 4X18 X RAY DECT (DISPOSABLE) ×2 IMPLANT
STAPLER VISISTAT 35W (STAPLE) IMPLANT
STRIP CLOSURE SKIN 1/2X4 (GAUZE/BANDAGES/DRESSINGS) ×1 IMPLANT
SUCTION FRAZIER TIP 10 FR DISP (SUCTIONS) ×2 IMPLANT
SUT ETHILON 3 0 PS 1 (SUTURE) IMPLANT
SUT ETHILON 4 0 PS 2 18 (SUTURE) IMPLANT
SUT MNCRL AB 4-0 PS2 18 (SUTURE) ×1 IMPLANT
SUT VIC AB 0 CT1 27 (SUTURE)
SUT VIC AB 0 CT1 27XBRD ANBCTR (SUTURE) IMPLANT
SUT VIC AB 2-0 SH 18 (SUTURE) IMPLANT
SUT VIC AB 3-0 SH 27 (SUTURE)
SUT VIC AB 3-0 SH 27X BRD (SUTURE) IMPLANT
SUT VICRYL 3-0 CR8 SH (SUTURE) ×1 IMPLANT
SYR BULB 3OZ (MISCELLANEOUS) ×2 IMPLANT
SYR CONTROL 10ML LL (SYRINGE) IMPLANT
TUBE CONNECTING 20X1/4 (TUBING) ×2 IMPLANT
UNDERPAD 30X30 INCONTINENT (UNDERPADS AND DIAPERS) ×2 IMPLANT
YANKAUER SUCT BULB TIP NO VENT (SUCTIONS) ×2 IMPLANT

## 2014-04-08 NOTE — Anesthesia Postprocedure Evaluation (Signed)
  Anesthesia Post-op Note  Patient: Vanessa Higgins  Procedure(s) Performed: Procedure(s): OPEN REDUCTION INTERNAL FIXATION (ORIF) ANKLE FRACTURE LEFT ANKLE FRACTURE OPEN TREATMENT BIMALLEOLAR ANKLE INCLUDES INTERNAL FIXATION (Left)  Patient Location: PACU  Anesthesia Type:GA combined with regional for post-op pain  Level of Consciousness: awake, alert , oriented and patient cooperative  Airway and Oxygen Therapy: Patient Spontanous Breathing  Post-op Pain: none  Post-op Assessment: Post-op Vital signs reviewed, Patient's Cardiovascular Status Stable, Respiratory Function Stable, Patent Airway, No signs of Nausea or vomiting and Pain level controlled  Post-op Vital Signs: Reviewed and stable  Last Vitals:  Filed Vitals:   04/08/14 1615  BP: 152/68  Pulse: 89  Temp:   Resp: 18    Complications: No apparent anesthesia complications

## 2014-04-08 NOTE — Discharge Instructions (Signed)
Diet: As you were doing prior to hospitalization   Shower:  May shower but keep the wounds dry, use an occlusive plastic wrap, NO SOAKING IN TUB.  If the bandage gets wet, change with a clean dry gauze.  Dressing:  You may change your dressing 3-5 days after surgery.  Then change the dressing daily with sterile gauze dressing.    There are sticky tapes (steri-strips) on your wounds and all the stitches are absorbable.  Leave the steri-strips in place when changing your dressings, they will peel off with time, usually 2-3 weeks.  Activity:  Increase activity slowly as tolerated, but follow the weight bearing instructions below.  No lifting or driving for 6 weeks.  Weight Bearing:   NON WEIGHT BEARING.    To prevent constipation: you may use a stool softener such as -  Colace (over the counter) 100 mg by mouth twice a day  Drink plenty of fluids (prune juice may be helpful) and high fiber foods Miralax (over the counter) for constipation as needed.    Itching:  If you experience itching with your medications, try taking only a single pain pill, or even half a pain pill at a time.  You may take up to 10 pain pills per day, and you can also use benadryl over the counter for itching or also to help with sleep.   Precautions:  If you experience chest pain or shortness of breath - call 911 immediately for transfer to the hospital emergency department!!  If you develop a fever greater that 101 F, purulent drainage from wound, increased redness or drainage from wound, or calf pain -- Call the office at [phone]                                                Follow- Up Appointment:  Please call for an appointment to be seen in 2 weeks [place] - [phone]  Regional Anesthesia Blocks  1. Numbness or the inability to move the "blocked" extremity may last from 3-48 hours after placement. The length of time depends on the medication injected and your individual response to the medication. If  the numbness is not going away after 48 hours, call your surgeon.  2. The extremity that is blocked will need to be protected until the numbness is gone and the  Strength has returned. Because you cannot feel it, you will need to take extra care to avoid injury. Because it may be weak, you may have difficulty moving it or using it. You may not know what position it is in without looking at it while the block is in effect.  3. For blocks in the legs and feet, returning to weight bearing and walking needs to be done carefully. You will need to wait until the numbness is entirely gone and the strength has returned. You should be able to move your leg and foot normally before you try and bear weight or walk. You will need someone to be with you when you first try to ensure you do not fall and possibly risk injury.  4. Bruising and tenderness at the needle site are common side effects and will resolve in a few days.  5. Persistent numbness or new problems with movement should be communicated to the surgeon or the [place] [phone] [place] [phone]).  Post Anesthesia Home Care Instructions  Activity: Get plenty of rest for the remainder of the day. A responsible adult should stay with you for 24 hours following the procedure.  For the next 24 hours, DO NOT: -Drive a car -Paediatric nurse -Drink alcoholic beverages -Take any medication unless instructed by your physician -Make any legal decisions or sign important papers.  Meals: Start with liquid foods such as gelatin or soup. Progress to regular foods as tolerated. Avoid greasy, spicy, heavy foods. If nausea and/or vomiting occur, drink only clear liquids until the nausea and/or vomiting subsides. Call your physician if vomiting continues.  Special Instructions/Symptoms: Your throat may feel dry or sore from the anesthesia or the breathing tube placed in your throat during surgery. If this causes  discomfort, gargle with warm salt water. The discomfort should disappear within 24 hours.

## 2014-04-08 NOTE — Anesthesia Procedure Notes (Addendum)
Anesthesia Regional Block:  Popliteal block  Pre-Anesthetic Checklist: ,, timeout performed, Correct Patient, Correct Site, Correct Laterality, Correct Procedure, Correct Position, site marked, Risks and benefits discussed,  Surgical consent,  Pre-op evaluation,  At surgeon's request and post-op pain management  Laterality: Left and Lower  Prep: chloraprep       Needles:  Injection technique: Single-shot  Needle Type: Echogenic Needle     Needle Length: 9cm 9 cm Needle Gauge: 21 and 21 G    Additional Needles:  Procedures: ultrasound guided (picture in chart) Popliteal block Narrative:  Start time: 04/08/2014 11:28 AM End time: 04/08/2014 11:33 AM Injection made incrementally with aspirations every 5 mL.  Performed by: Personally  Anesthesiologist: Lorrene Reid, MD   Anesthesia Regional Block:  Adductor canal block  Pre-Anesthetic Checklist: ,, timeout performed, Correct Patient, Correct Site, Correct Laterality, Correct Procedure, Correct Position, site marked, Risks and benefits discussed,  Surgical consent,  Pre-op evaluation,  At surgeon's request and post-op pain management  Laterality: Left and Lower  Prep: chloraprep       Needles:  Injection technique: Single-shot  Needle Type: Echogenic Needle     Needle Length: 9cm 9 cm Needle Gauge: 21 and 21 G    Additional Needles:  Procedures: ultrasound guided (picture in chart) Adductor canal block Narrative:  Start time: 04/08/2014 11:35 AM End time: 04/08/2014 11:39 AM Injection made incrementally with aspirations every 5 mL.  Performed by: Personally  Anesthesiologist: Lorrene Reid, MD   Procedure Name: Intubation Date/Time: 04/08/2014 1:40 PM Performed by: Melynda Ripple D Pre-anesthesia Checklist: Patient identified, Emergency Drugs available, Suction available and Patient being monitored Patient Re-evaluated:Patient Re-evaluated prior to inductionOxygen Delivery Method: Circle System  Utilized Preoxygenation: Pre-oxygenation with 100% oxygen Intubation Type: IV induction Ventilation: Mask ventilation without difficulty Laryngoscope Size: 3 and Mac Grade View: Grade I Tube type: Oral Tube size: 7.0 mm Number of attempts: 1 Airway Equipment and Method: stylet and oral airway Placement Confirmation: ETT inserted through vocal cords under direct vision,  positive ETCO2 and breath sounds checked- equal and bilateral Secured at: 22 cm Tube secured with: Tape Dental Injury: Teeth and Oropharynx as per pre-operative assessment     Anesthesia Procedure Note

## 2014-04-08 NOTE — H&P (Signed)
PREOPERATIVE H&P  Chief Complaint: LEFT ANKLE FRACTURE BIMALLEOLAR  HPI: Vanessa Higgins is a 63 y.o. female who presents for preoperative history and physical with a diagnosis of LEFT ANKLE FRACTURE BIMALLEOLAR. Symptoms are rated as moderate to severe, and have been worsening.  This is significantly impairing activities of daily living.  She has elected for surgical management. The injury occurred April 4, she has moderate to severe pain, seen in urgent care and then the office. This is after a fall in the yard.  Past Medical History  Diagnosis Date  . Asthma   . Arthritis   . Diabetes mellitus   . Headache(784.0)   . GERD (gastroesophageal reflux disease)   . Allergy     hay fever  . Hypertension   . Hyperlipidemia   . Migraine   . Blood transfusion abn reaction or complication, no procedure mishap   . UTI (lower urinary tract infection)   . Wears glasses    Past Surgical History  Procedure Laterality Date  . Appendectomy  April 25, 1976  . Tonsillectomy and adenoidectomy      as a child  . Tubal ligation  05/1976  . 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 Parotidectomy  1980    left  . Colonoscopy     History   Social History  . Marital Status: Widowed    Spouse Name: N/A    Number of Children: N/A  . Years of Education: N/A   Social History Main Topics  . Smoking status: Never Smoker   . Smokeless tobacco: None  . Alcohol Use: No  . Drug Use: No  . Sexual Activity: None   Other Topics Concern  . None   Social History Narrative   Lives in [place] with daughter and twin grandchildren 9YO. Husband deceased 2008-04-25.      Work - [employer]   Diet - healthy   Exercise - gym 2 x per week   Family History  Problem Relation Age of Onset  . Heart disease Mother   . Sudden death Mother   . Cancer Mother 15    pancreatic  . Heart disease Father 51  . Sudden death Father   . Cancer Brother     colon  . Stroke Maternal Grandmother   . Heart disease Maternal  Grandfather   . Cancer Sister     brain   Allergies  Allergen Reactions  . Sulfa Antibiotics Hives and Shortness Of Breath   Prior to Admission medications   Medication Sig Start Date End Date Taking? Authorizing Provider  ramipril (ALTACE) 10 MG capsule Take 10 mg by mouth daily.   Yes Historical Provider, MD  aspirin 81 MG tablet Take 81 mg by mouth daily.    Historical Provider, MD  b complex vitamins tablet Take 1 tablet by mouth daily.    Historical Provider, MD  Exenatide ER (BYDUREON) 2 MG SUSR Inject 2 mg into the skin once a week. 05/20/13   Jackolyn Confer, MD  Fluticasone-Salmeterol (ADVAIR) 250-50 MCG/DOSE AEPB Inhale 1 puff into the lungs 2 (two) times daily. 11/15/13   Jackolyn Confer, MD  loratadine (CLARITIN) 10 MG tablet Take 10 mg by mouth daily.    Historical Provider, MD  metFORMIN (GLUCOPHAGE) 1000 MG tablet Take 1 tablet (1,000 mg total) by mouth 2 (two) times daily with a meal. 08/19/13   Jackolyn Confer, MD  montelukast (SINGULAIR) 10 MG tablet Take 10 mg by mouth at  bedtime. 09/22/13   Jackolyn Confer, MD  Multiple Vitamins-Minerals (CENTRUM SILVER PO) Take 1 tablet by mouth daily.    Historical Provider, MD  Omega-3 Fatty Acids (FISH OIL) 1200 MG CAPS Take 1 capsule by mouth daily.    Historical Provider, MD  omeprazole (PRILOSEC) 20 MG capsule Take 1 capsule (20 mg total) by mouth daily. 01/26/14   Jackolyn Confer, MD  venlafaxine XR (EFFEXOR-XR) 150 MG 24 hr capsule Take 1 capsule by mouth daily. 03/24/14   Jackolyn Confer, MD  Verapamil HCl CR 300 MG CP24 Take 1 capsule (300 mg total) by mouth at bedtime. 11/24/13   Jackolyn Confer, MD  VYTORIN 10-20 MG per tablet Take 1 tablet by mouth at bedtime. 03/24/14   Jackolyn Confer, MD  zoster vaccine live, PF, (ZOSTAVAX) 32549 UNT/0.65ML injection Inject 19,400 Units into the skin once. 05/03/13   Jackolyn Confer, MD     Positive ROS: All other systems have been reviewed and were otherwise negative with  the exception of those mentioned in the HPI and as above.  Physical Exam: General: Alert, no acute distress Cardiovascular: No pedal edema Respiratory: No cyanosis, no use of accessory musculature GI: No organomegaly, abdomen is soft and non-tender Skin: No lesions in the area of chief complaint Neurologic: Sensation intact distally Psychiatric: Patient is competent for consent with normal mood and affect Lymphatic: No axillary or cervical lymphadenopathy  MUSCULOSKELETAL: Left ankle has positive pain to palpation diffusely, along with some swelling and pain to palpation both medially and laterally.  Assessment: LEFT ANKLE FRACTURE BIMALLEOLAR  Plan: Plan for Procedure(s): OPEN REDUCTION INTERNAL FIXATION (ORIF) ANKLE FRACTURE LEFT ANKLE FRACTURE OPEN TREATMENT BIMALLEOLAR ANKLE INCLUDES INTERNAL FIXATION  The risks benefits and alternatives were discussed with the patient including but not limited to the risks of nonoperative treatment, versus surgical intervention including infection, bleeding, nerve injury, malunion, nonunion, the need for revision surgery, hardware prominence, hardware failure, the need for hardware removal, blood clots, cardiopulmonary complications, morbidity, mortality, among others, and they were willing to proceed.     Johnny Bridge, MD Cell [phone]   04/08/2014 7:22 AM

## 2014-04-08 NOTE — Anesthesia Preprocedure Evaluation (Addendum)
Anesthesia Evaluation  Patient identified by MRN, date of birth, ID band Patient awake    Reviewed: Allergy & Precautions, H&P , NPO status , Patient's Chart, lab work & pertinent test results  Airway Mallampati: I TM Distance: >3 FB     Dental  (+) Teeth Intact, Dental Advisory Given   Pulmonary  breath sounds clear to auscultation        Cardiovascular hypertension, Pt. on medications Rhythm:Regular     Neuro/Psych    GI/Hepatic GERD-  Medicated and Controlled,  Endo/Other  diabetes, Well Controlled, Type 2, Oral Hypoglycemic AgentsMorbid obesity  Renal/GU      Musculoskeletal   Abdominal   Peds  Hematology   Anesthesia Other Findings Large overbite  Reproductive/Obstetrics                          Anesthesia Physical Anesthesia Plan  ASA: III  Anesthesia Plan: General   Post-op Pain Management:    Induction: Intravenous  Airway Management Planned: LMA  Additional Equipment:   Intra-op Plan:   Post-operative Plan: Extubation in OR  Informed Consent: I have reviewed the patients History and Physical, chart, labs and discussed the procedure including the risks, benefits and alternatives for the proposed anesthesia with the patient or authorized representative who has indicated his/her understanding and acceptance.   Dental advisory given  Plan Discussed with: CRNA, Anesthesiologist and Surgeon  Anesthesia Plan Comments:         Anesthesia Quick Evaluation

## 2014-04-08 NOTE — Op Note (Signed)
04/08/2014  PATIENT:  Vanessa Higgins    PRE-OPERATIVE DIAGNOSIS:  LEFT ANKLE FRACTURE TRIMALLEOLAR  POST-OPERATIVE DIAGNOSIS:  Same  PROCEDURE:  Open reduction internal fixation left distal fibula with exploration and soft tissue repair of medial structures without fixation of posterior lip  SURGEON:  Johnny Bridge, MD  PHYSICIAN ASSISTANT: Joya Gaskins, OPA-C, present and scrubbed throughout the case, critical for completion in a timely fashion, and for retraction, instrumentation, and closure.  ANESTHESIA:   General  PREOPERATIVE INDICATIONS:  Vanessa Higgins is a  62 y.o. female with a diagnosis of LEFT ANKLE FRACTURE TRIMALLEOLAR who elected for surgical management to minimize the risk for malunion and nonunion and post-traumatic arthritis.    The risks benefits and alternatives were discussed with the patient preoperatively including but not limited to the risks of infection, bleeding, nerve injury, cardiopulmonary complications, the need for revision surgery, the need for hardware removal, among others, and the patient was willing to proceed.  OPERATIVE IMPLANTS: Synthes 1/3 tubular plate, with a single interfragmentary lag screw.  OPERATIVE PROCEDURE: The patient was brought to the operating room and placed in the supine position. All bony prominences were padded. General anesthesia was administered. The left lower extremity was prepped and draped in the usual sterile fashion. The leg was elevated and exsanguinated and the tourniquet was inflated. Time out was performed.   Incision was made over the distal fibula and the fracture was exposed and reduced anatomically with a clamp. A lag screw was placed. I then applied a 1/3 tubular plate and secured it proximally and distally non-locking screws. Bone quality was mediocre to poor. I used c-arm to confirm satisfactory reduction and fixation. I then went to the medial side, and based on the x-ray it looked like there was a very small  medial malleolar piece. I exposed the medial malleolus, and made a longitudinal incision through the superficial deltoid, and found a piece posteriorly, but it was too comminuted and too small to hold a screw. In fact suture fixation was the best form of fixation, so the periosteum and fragment was sutured back to the main segment of the medial bone, and diabetes live fluoroscopy to confirm satisfactory reduction on AP, oblique, and lateral views, as well as varus and valgus stress testing, and the syndesmosis was tested and was intact.  The wounds were irrigated, and closed with vicryl with routine closure for the skin. The wounds were injected with local anesthetic. Sterile gauze was applied followed by a posterior splint. She was awakened and returned to the PACU in stable and satisfactory condition. There were no complications.

## 2014-04-08 NOTE — Progress Notes (Signed)
Assisted Dr. Crews with left, ultrasound guided, popliteal/saphenous block. Side rails up, monitors on throughout procedure. See vital signs in flow sheet. Tolerated Procedure well. 

## 2014-04-08 NOTE — Transfer of Care (Signed)
Immediate Anesthesia Transfer of Care Note  Patient: Vanessa Higgins  Procedure(s) Performed: Procedure(s): OPEN REDUCTION INTERNAL FIXATION (ORIF) ANKLE FRACTURE LEFT ANKLE FRACTURE OPEN TREATMENT BIMALLEOLAR ANKLE INCLUDES INTERNAL FIXATION (Left)  Patient Location: PACU  Anesthesia Type:General and Regional  Level of Consciousness: awake, alert  and oriented  Airway & Oxygen Therapy: Patient Spontanous Breathing and Patient connected to face mask oxygen  Post-op Assessment: Report given to PACU RN and Post -op Vital signs reviewed and stable  Post vital signs: Reviewed and stable  Complications: No apparent anesthesia complications

## 2014-04-12 ENCOUNTER — Encounter (HOSPITAL_BASED_OUTPATIENT_CLINIC_OR_DEPARTMENT_OTHER): Payer: Self-pay | Admitting: Orthopedic Surgery

## 2014-04-12 NOTE — Addendum Note (Signed)
Addendum created 04/12/14 0753 by Tawni Millers, CRNA   Modules edited: Charges VN

## 2014-04-15 ENCOUNTER — Ambulatory Visit: Payer: 59 | Admitting: Internal Medicine

## 2014-05-09 ENCOUNTER — Ambulatory Visit: Payer: 59 | Admitting: Internal Medicine

## 2014-05-24 ENCOUNTER — Other Ambulatory Visit: Payer: Self-pay | Admitting: Internal Medicine

## 2014-05-25 NOTE — Telephone Encounter (Signed)
Appt sch 05/27/14

## 2014-05-27 ENCOUNTER — Encounter: Payer: Self-pay | Admitting: Internal Medicine

## 2014-05-27 ENCOUNTER — Ambulatory Visit (INDEPENDENT_AMBULATORY_CARE_PROVIDER_SITE_OTHER): Payer: 59 | Admitting: Internal Medicine

## 2014-05-27 VITALS — BP 120/72 | HR 87 | Temp 98.1°F | Ht 63.5 in | Wt 206.5 lb

## 2014-05-27 DIAGNOSIS — I10 Essential (primary) hypertension: Secondary | ICD-10-CM

## 2014-05-27 DIAGNOSIS — E119 Type 2 diabetes mellitus without complications: Secondary | ICD-10-CM

## 2014-05-27 DIAGNOSIS — E785 Hyperlipidemia, unspecified: Secondary | ICD-10-CM

## 2014-05-27 LAB — COMPREHENSIVE METABOLIC PANEL
ALT: 41 U/L — ABNORMAL HIGH (ref 0–35)
AST: 45 U/L — ABNORMAL HIGH (ref 0–37)
Albumin: 3.9 g/dL (ref 3.5–5.2)
Alkaline Phosphatase: 39 U/L (ref 39–117)
BUN: 17 mg/dL (ref 6–23)
CO2: 29 mEq/L (ref 19–32)
Calcium: 9.7 mg/dL (ref 8.4–10.5)
Chloride: 102 mEq/L (ref 96–112)
Creatinine, Ser: 0.7 mg/dL (ref 0.4–1.2)
GFR: 85.09 mL/min (ref >60.00)
Glucose, Bld: 108 mg/dL — ABNORMAL HIGH (ref 70–99)
Potassium: 4.4 mEq/L (ref 3.5–5.1)
Sodium: 139 mEq/L (ref 135–145)
Total Bilirubin: 0.7 mg/dL (ref 0.2–1.2)
Total Protein: 7.4 g/dL (ref 6.0–8.3)

## 2014-05-27 LAB — LIPID PANEL
Cholesterol: 178 mg/dL (ref 0–200)
HDL: 46.2 mg/dL (ref >39.00)
LDL Cholesterol: 105 mg/dL — ABNORMAL HIGH (ref 0–99)
Total CHOL/HDL Ratio: 4
Triglycerides: 136 mg/dL (ref 0.0–149.0)
VLDL: 27.2 mg/dL (ref 0.0–40.0)

## 2014-05-27 LAB — MICROALBUMIN / CREATININE URINE RATIO
Creatinine,U: 121.5 mg/dL
Microalb Creat Ratio: 3.2 mg/g (ref 0.0–30.0)
Microalb, Ur: 3.9 mg/dL — ABNORMAL HIGH (ref 0.0–1.9)

## 2014-05-27 LAB — HEMOGLOBIN A1C: Hgb A1c MFr Bld: 6.6 % — ABNORMAL HIGH (ref 4.6–6.5)

## 2014-05-27 MED ORDER — VERAPAMIL HCL ER 300 MG PO CP24: 300.0000 mg | ORAL_CAPSULE | Freq: Every day | ORAL | Status: DC

## 2014-05-27 MED ORDER — EZETIMIBE-SIMVASTATIN 10-20 MG PO TABS
1.0000 | ORAL_TABLET | Freq: Every day | ORAL | Status: DC
Start: 2014-05-27 — End: 2015-05-08

## 2014-05-27 MED ORDER — VENLAFAXINE HCL ER 150 MG PO CP24: 150.0000 mg | ORAL_CAPSULE | Freq: Every day | ORAL | Status: DC

## 2014-05-27 MED ORDER — RAMIPRIL 10 MG PO CAPS: 10.0000 mg | ORAL_CAPSULE | Freq: Every day | ORAL | Status: DC

## 2014-05-27 MED ORDER — MONTELUKAST SODIUM 10 MG PO TABS: 10.0000 mg | ORAL_TABLET | Freq: Every day | ORAL | Status: DC

## 2014-05-27 MED ORDER — OMEPRAZOLE 20 MG PO CPDR: 20.0000 mg | DELAYED_RELEASE_CAPSULE | Freq: Every day | ORAL | Status: DC

## 2014-05-27 MED ORDER — FLUTICASONE-SALMETEROL 250-50 MCG/DOSE IN AEPB: 1.0000 | INHALATION_SPRAY | Freq: Two times a day (BID) | RESPIRATORY_TRACT | Status: DC

## 2014-05-27 NOTE — Assessment & Plan Note (Signed)
Will check lipids and LFTs with labs today. Continue Vytorin.

## 2014-05-27 NOTE — Assessment & Plan Note (Signed)
  BP Readings from Last 3 Encounters:  05/27/14 120/72  04/08/14 146/76  04/08/14 146/76   BP well controlled on current medications. Will continue. Will check renal function with labs.

## 2014-05-27 NOTE — Progress Notes (Signed)
Pre visit review using our clinic review tool, if applicable. No additional management support is needed unless otherwise documented below in the visit note. 

## 2014-05-27 NOTE — Progress Notes (Signed)
Subjective:    Patient ID: Vanessa Higgins, female    DOB: [birth year], 63 y.o.   MRN: [id number]  HPI 63YO female presents for follow up.  Left ankle fracture - Occurred in 03/2014. S/p ORIF. Some pain with walking. Not taking anything for this. Limiting weight bearing 50%. Generally feels that things are improving.  DM - BG well controlled. Did not bring record of BG. Compliant with medications.   No new concerns today.   Review of Systems  Constitutional: Negative for fever, chills, appetite change, fatigue and unexpected weight change.  HENT: Negative for congestion, ear pain, sinus pressure, sore throat, trouble swallowing and voice change.   Eyes: Negative for visual disturbance.  Respiratory: Negative for cough, shortness of breath, wheezing and stridor.   Cardiovascular: Negative for chest pain, palpitations and leg swelling.  Gastrointestinal: Negative for nausea, vomiting, abdominal pain, diarrhea, constipation, blood in stool, abdominal distention and anal bleeding.  Genitourinary: Negative for dysuria and flank pain.  Musculoskeletal: Positive for arthralgias. Negative for gait problem, myalgias and neck pain.  Skin: Negative for color change and rash.  Neurological: Negative for dizziness and headaches.  Hematological: Negative for adenopathy. Does not bruise/bleed easily.  Psychiatric/Behavioral: Negative for suicidal ideas, sleep disturbance and dysphoric mood. The patient is not nervous/anxious.        Objective:    BP 120/72  Pulse 87  Temp(Src) 98.1 F (36.7 C) (Oral)  Ht 5' 3.5" (1.613 m)  Wt 206 lb 8 oz (93.668 kg)  BMI 36.00 kg/m2  SpO2 92% Physical Exam  Constitutional: She is oriented to person, place, and time. She appears well-developed and well-nourished. No distress.  HENT:  Head: Normocephalic and atraumatic.  Right Ear: External ear normal.  Left Ear: External ear normal.  Nose: Nose normal.  Mouth/Throat: Oropharynx is clear and moist. No  oropharyngeal exudate.  Eyes: Conjunctivae are normal. Pupils are equal, round, and reactive to light. Right eye exhibits no discharge. Left eye exhibits no discharge. No scleral icterus.  Neck: Normal range of motion. Neck supple. No tracheal deviation present. No thyromegaly present.  Cardiovascular: Normal rate, regular rhythm, normal heart sounds and intact distal pulses.  Exam reveals no gallop and no friction rub.   No murmur heard. Pulmonary/Chest: Effort normal and breath sounds normal. No accessory muscle usage. Not tachypneic. No respiratory distress. She has no decreased breath sounds. She has no wheezes. She has no rhonchi. She has no rales. She exhibits no tenderness.  Musculoskeletal: She exhibits no edema and no tenderness.       Left foot: She exhibits decreased range of motion (Left ankle in boot).  Lymphadenopathy:    She has no cervical adenopathy.  Neurological: She is alert and oriented to person, place, and time. No cranial nerve deficit. She exhibits normal muscle tone. Coordination normal.  Skin: Skin is warm and dry. No rash noted. She is not diaphoretic. No erythema. No pallor.  Psychiatric: She has a normal mood and affect. Her behavior is normal. Judgment and thought content normal.          Assessment & Plan:   Problem List Items Addressed This Visit     Unprioritized   Diabetes mellitus type 2, controlled - Primary     Will check A1c with labs today. Continue Metformin and Bydureon. Continue follow up as scheduled with Dr. Eddie Dibbles.    Relevant Medications      ramipril (ALTACE) capsule   Other Relevant Orders  Comprehensive metabolic panel      Hemoglobin A1c      Lipid panel      Microalbumin / creatinine urine ratio   Hyperlipidemia LDL goal < 70     Will check lipids and LFTs with labs today. Continue Vytorin.    Relevant Medications      ezetimibe-simvastatin (VYTORIN) 10-20 MG per tablet      Verapamil HCl CR 300 MG CP24      ramipril  (ALTACE) capsule   Hypertension       BP Readings from Last 3 Encounters:  05/27/14 120/72  04/08/14 146/76  04/08/14 146/76   BP well controlled on current medications. Will continue. Will check renal function with labs.    Relevant Medications      ezetimibe-simvastatin (VYTORIN) 10-20 MG per tablet      Verapamil HCl CR 300 MG CP24      ramipril (ALTACE) capsule       Return in about 6 months (around 11/27/2014) for Wellness Visit.

## 2014-05-27 NOTE — Assessment & Plan Note (Signed)
Will check A1c with labs today. Continue Metformin and Bydureon. Continue follow up as scheduled with Dr. Eddie Dibbles.

## 2014-05-28 ENCOUNTER — Telehealth: Payer: Self-pay | Admitting: Internal Medicine

## 2014-05-28 NOTE — Telephone Encounter (Signed)
Relevant patient education mailed to patient.  

## 2014-06-06 ENCOUNTER — Ambulatory Visit: Payer: 59

## 2014-06-14 ENCOUNTER — Ambulatory Visit: Payer: 59 | Attending: Orthopedic Surgery

## 2014-06-14 DIAGNOSIS — M25579 Pain in unspecified ankle and joints of unspecified foot: Secondary | ICD-10-CM | POA: Insufficient documentation

## 2014-06-14 DIAGNOSIS — M25676 Stiffness of unspecified foot, not elsewhere classified: Secondary | ICD-10-CM | POA: Insufficient documentation

## 2014-06-14 DIAGNOSIS — IMO0001 Reserved for inherently not codable concepts without codable children: Secondary | ICD-10-CM | POA: Insufficient documentation

## 2014-06-14 DIAGNOSIS — R262 Difficulty in walking, not elsewhere classified: Secondary | ICD-10-CM | POA: Insufficient documentation

## 2014-06-14 DIAGNOSIS — M25673 Stiffness of unspecified ankle, not elsewhere classified: Secondary | ICD-10-CM | POA: Insufficient documentation

## 2014-06-22 ENCOUNTER — Ambulatory Visit: Payer: 59 | Admitting: Physical Therapy

## 2014-06-27 ENCOUNTER — Ambulatory Visit: Payer: 59 | Admitting: Physical Therapy

## 2014-06-29 ENCOUNTER — Ambulatory Visit: Payer: 59 | Attending: Orthopedic Surgery | Admitting: Physical Therapy

## 2014-06-29 DIAGNOSIS — R262 Difficulty in walking, not elsewhere classified: Secondary | ICD-10-CM | POA: Insufficient documentation

## 2014-06-29 DIAGNOSIS — M25579 Pain in unspecified ankle and joints of unspecified foot: Secondary | ICD-10-CM | POA: Insufficient documentation

## 2014-06-29 DIAGNOSIS — IMO0001 Reserved for inherently not codable concepts without codable children: Secondary | ICD-10-CM | POA: Insufficient documentation

## 2014-06-29 DIAGNOSIS — M25676 Stiffness of unspecified foot, not elsewhere classified: Secondary | ICD-10-CM | POA: Insufficient documentation

## 2014-06-29 DIAGNOSIS — M25673 Stiffness of unspecified ankle, not elsewhere classified: Secondary | ICD-10-CM | POA: Insufficient documentation

## 2014-07-05 ENCOUNTER — Ambulatory Visit: Payer: 59 | Admitting: Physical Therapy

## 2014-07-07 ENCOUNTER — Encounter: Payer: 59 | Admitting: Physical Therapy

## 2014-07-12 ENCOUNTER — Ambulatory Visit: Payer: 59 | Admitting: Physical Therapy

## 2014-07-14 ENCOUNTER — Ambulatory Visit: Payer: 59

## 2014-08-15 ENCOUNTER — Telehealth: Payer: Self-pay | Admitting: *Deleted

## 2014-08-15 ENCOUNTER — Other Ambulatory Visit: Payer: Self-pay | Admitting: *Deleted

## 2014-08-15 DIAGNOSIS — R7402 Elevation of levels of lactic acid dehydrogenase (LDH): Secondary | ICD-10-CM

## 2014-08-15 DIAGNOSIS — R7401 Elevation of levels of liver transaminase levels: Secondary | ICD-10-CM

## 2014-08-15 DIAGNOSIS — R740 Nonspecific elevation of levels of transaminase and lactic acid dehydrogenase [LDH]: Principal | ICD-10-CM

## 2014-08-15 NOTE — Telephone Encounter (Signed)
CMP 790.4 

## 2014-08-15 NOTE — Telephone Encounter (Signed)
Pt is coming in tomorrow what labs and dx?  

## 2014-08-16 ENCOUNTER — Other Ambulatory Visit (INDEPENDENT_AMBULATORY_CARE_PROVIDER_SITE_OTHER): Payer: 59

## 2014-08-16 DIAGNOSIS — R740 Nonspecific elevation of levels of transaminase and lactic acid dehydrogenase [LDH]: Principal | ICD-10-CM

## 2014-08-16 DIAGNOSIS — R7401 Elevation of levels of liver transaminase levels: Secondary | ICD-10-CM

## 2014-08-16 DIAGNOSIS — R7402 Elevation of levels of lactic acid dehydrogenase (LDH): Secondary | ICD-10-CM

## 2014-08-17 ENCOUNTER — Other Ambulatory Visit: Payer: Self-pay | Admitting: Internal Medicine

## 2014-08-17 DIAGNOSIS — R7989 Other specified abnormal findings of blood chemistry: Secondary | ICD-10-CM

## 2014-08-17 DIAGNOSIS — R945 Abnormal results of liver function studies: Principal | ICD-10-CM

## 2014-08-17 LAB — COMPREHENSIVE METABOLIC PANEL
ALT: 37 U/L — ABNORMAL HIGH (ref 0–35)
AST: 38 U/L — ABNORMAL HIGH (ref 0–37)
Albumin: 4 g/dL (ref 3.5–5.2)
Alkaline Phosphatase: 53 U/L (ref 39–117)
BUN: 17 mg/dL (ref 6–23)
CO2: 24 mEq/L (ref 19–32)
Calcium: 9.8 mg/dL (ref 8.4–10.5)
Chloride: 100 mEq/L (ref 96–112)
Creatinine, Ser: 0.7 mg/dL (ref 0.4–1.2)
GFR: 90.74 mL/min (ref >60.00)
Glucose, Bld: 122 mg/dL — ABNORMAL HIGH (ref 70–99)
Potassium: 4.4 mEq/L (ref 3.5–5.1)
Sodium: 136 mEq/L (ref 135–145)
Total Bilirubin: 0.9 mg/dL (ref 0.2–1.2)
Total Protein: 8 g/dL (ref 6.0–8.3)

## 2014-08-24 ENCOUNTER — Encounter: Payer: Self-pay | Admitting: *Deleted

## 2014-08-24 ENCOUNTER — Ambulatory Visit: Payer: Self-pay | Admitting: Internal Medicine

## 2014-08-24 ENCOUNTER — Telehealth: Payer: Self-pay | Admitting: Internal Medicine

## 2014-08-24 DIAGNOSIS — K7581 Nonalcoholic steatohepatitis (NASH): Secondary | ICD-10-CM

## 2014-08-24 NOTE — Telephone Encounter (Signed)
Liver ultrasound shows diffuse fatty infiltration in the liver. I would recommend that we set up an evaluation with GI for further management. I will place order for referral.

## 2014-08-24 NOTE — Telephone Encounter (Signed)
Sent my chart with results. 

## 2014-08-26 NOTE — Telephone Encounter (Signed)
Mailed unread message to pt  

## 2014-09-19 ENCOUNTER — Encounter: Payer: Self-pay | Admitting: Internal Medicine

## 2014-10-05 ENCOUNTER — Ambulatory Visit: Payer: Self-pay | Admitting: Unknown Physician Specialty

## 2014-10-20 DIAGNOSIS — D509 Iron deficiency anemia, unspecified: Secondary | ICD-10-CM | POA: Insufficient documentation

## 2014-11-17 ENCOUNTER — Ambulatory Visit: Payer: Self-pay | Admitting: Unknown Physician Specialty

## 2014-11-17 LAB — HM COLONOSCOPY

## 2014-12-01 ENCOUNTER — Ambulatory Visit: Payer: 59 | Admitting: Internal Medicine

## 2014-12-02 ENCOUNTER — Ambulatory Visit (INDEPENDENT_AMBULATORY_CARE_PROVIDER_SITE_OTHER): Payer: 59 | Admitting: Internal Medicine

## 2014-12-02 ENCOUNTER — Encounter: Payer: Self-pay | Admitting: Internal Medicine

## 2014-12-02 VITALS — BP 114/75 | HR 82 | Temp 98.1°F | Ht 64.75 in | Wt 203.5 lb

## 2014-12-02 DIAGNOSIS — I10 Essential (primary) hypertension: Secondary | ICD-10-CM

## 2014-12-02 DIAGNOSIS — E785 Hyperlipidemia, unspecified: Secondary | ICD-10-CM

## 2014-12-02 DIAGNOSIS — E119 Type 2 diabetes mellitus without complications: Secondary | ICD-10-CM

## 2014-12-02 DIAGNOSIS — Z0000 Encounter for general adult medical examination without abnormal findings: Secondary | ICD-10-CM

## 2014-12-02 DIAGNOSIS — E559 Vitamin D deficiency, unspecified: Secondary | ICD-10-CM | POA: Insufficient documentation

## 2014-12-02 DIAGNOSIS — Z23 Encounter for immunization: Secondary | ICD-10-CM

## 2014-12-02 NOTE — Assessment & Plan Note (Signed)
Will check lipids with labs today.  

## 2014-12-02 NOTE — Patient Instructions (Signed)

## 2014-12-02 NOTE — Assessment & Plan Note (Signed)
Will check A1c with labs today. Follow up with Dr. Gabriel Carina as scheduled.

## 2014-12-02 NOTE — Progress Notes (Signed)
Pre visit review using our clinic review tool, if applicable. No additional management support is needed unless otherwise documented below in the visit note. 

## 2014-12-02 NOTE — Assessment & Plan Note (Signed)
General medical exam normal today. Breast exam and pelvic exam deferred as recently completed by her OB/GYN. Will request records on Pap smear. Encouraged healthy diet and regular physical activity. Mammogram ordered for 01/2015. Colonoscopy is up-to-date. Followup in 6 months or sooner as needed.

## 2014-12-02 NOTE — Progress Notes (Signed)
Subjective:    Patient ID: Vanessa Higgins, female    DOB: 1951-09-24, 63 y.o.   MRN: [id number]  HPI 63YO female presents for annual exam.  Wt Readings from Last 3 Encounters:  12/02/14 203 lb 8 oz (92.307 kg)  05/27/14 206 lb 8 oz (93.668 kg)  04/04/14 204 lb (92.534 kg)   Feeling well. No concerns today. Trying to follow healthier diet and stay active.  PAP and pelvic completed this year by GYN normal per pt. Mammogram due in 01/2015. Recent colonoscopy was normal.  Past medical, surgical, family and social history per today's encounter.  Review of Systems  Constitutional: Negative for fever, chills, appetite change, fatigue and unexpected weight change.  Eyes: Negative for visual disturbance.  Respiratory: Negative for shortness of breath.   Cardiovascular: Negative for chest pain and leg swelling.  Gastrointestinal: Negative for nausea, vomiting, abdominal pain, diarrhea and constipation.  Musculoskeletal: Negative for myalgias and arthralgias.  Skin: Negative for color change and rash.  Hematological: Negative for adenopathy. Does not bruise/bleed easily.  Psychiatric/Behavioral: Negative for sleep disturbance and dysphoric mood. The patient is not nervous/anxious.        Objective:    BP 114/75 mmHg  Pulse 82  Temp(Src) 98.1 F (36.7 C) (Oral)  Ht 5' 4.75" (1.645 m)  Wt 203 lb 8 oz (92.307 kg)  BMI 34.11 kg/m2  SpO2 92% Physical Exam  Constitutional: She is oriented to person, place, and time. She appears well-developed and well-nourished. No distress.  HENT:  Head: Normocephalic and atraumatic.  Right Ear: External ear normal.  Left Ear: External ear normal.  Nose: Nose normal.  Mouth/Throat: Oropharynx is clear and moist. No oropharyngeal exudate.  Eyes: Conjunctivae and EOM are normal. Pupils are equal, round, and reactive to light. Right eye exhibits no discharge.  Neck: Normal range of motion. Neck supple. No thyromegaly present.  Cardiovascular: Normal  rate, regular rhythm, normal heart sounds and intact distal pulses.  Exam reveals no gallop and no friction rub.   No murmur heard. Pulmonary/Chest: Effort normal. No respiratory distress. She has no wheezes. She has no rales.  Abdominal: Soft. Bowel sounds are normal. She exhibits no distension and no mass. There is no tenderness. There is no rebound and no guarding.  Musculoskeletal: Normal range of motion. She exhibits no edema or tenderness.  Lymphadenopathy:    She has no cervical adenopathy.  Neurological: She is alert and oriented to person, place, and time. No cranial nerve deficit. Coordination normal.  Skin: Skin is warm and dry. No rash noted. She is not diaphoretic. No erythema. No pallor.  Psychiatric: She has a normal mood and affect. Her behavior is normal. Judgment and thought content normal.          Assessment & Plan:   Problem List Items Addressed This Visit      Unprioritized   Diabetes mellitus type 2, controlled    Will check A1c with labs today. Follow up with Dr. Gabriel Carina as scheduled.    Relevant Orders      Comprehensive metabolic panel      Hemoglobin A1c   Hyperlipidemia    Will check lipids with labs today.    Relevant Orders      Lipid panel   Hypertension    BP Readings from Last 3 Encounters:  12/02/14 114/75  05/27/14 120/72  04/08/14 146/76   BP well controlled. Continue current medications.    Relevant Orders      Microalbumin / creatinine  urine ratio   Routine general medical examination at a health care facility - Primary    General medical exam normal today. Breast exam and pelvic exam deferred as recently completed by her OB/GYN. Will request records on Pap smear. Encouraged healthy diet and regular physical activity. Mammogram ordered for 01/2015. Colonoscopy is up-to-date. Followup in 6 months or sooner as needed.      Relevant Orders      MM Digital Screening   Vitamin D deficiency   Relevant Orders      Vit D  25 hydroxy (rtn  osteoporosis monitoring)    Other Visit Diagnoses    Need for vaccination with 13-polyvalent pneumococcal conjugate vaccine        Relevant Orders       Pneumococcal conjugate vaccine 13-valent (Completed)        Return in about 6 months (around 06/03/2015) for Recheck of Diabetes.

## 2014-12-02 NOTE — Assessment & Plan Note (Signed)
BP Readings from Last 3 Encounters:  12/02/14 114/75  05/27/14 120/72  04/08/14 146/76   BP well controlled. Continue current medications.

## 2014-12-03 LAB — MICROALBUMIN / CREATININE URINE RATIO
Creatinine, Urine: 129 mg/dL
Microalb Creat Ratio: 7.8 mg/g (ref 0.0–30.0)
Microalb, Ur: 1 mg/dL (ref <2.0)

## 2014-12-03 LAB — COMPREHENSIVE METABOLIC PANEL WITH GFR
ALT: 31 U/L (ref 0–35)
AST: 30 U/L (ref 0–37)
Albumin: 4.1 g/dL (ref 3.5–5.2)
Alkaline Phosphatase: 47 U/L (ref 39–117)
BUN: 15 mg/dL (ref 6–23)
CO2: 25 meq/L (ref 19–32)
Calcium: 9.2 mg/dL (ref 8.4–10.5)
Chloride: 100 meq/L (ref 96–112)
Creat: 0.57 mg/dL (ref 0.50–1.10)
Glucose, Bld: 89 mg/dL (ref 70–99)
Potassium: 5.1 meq/L (ref 3.5–5.3)
Sodium: 138 meq/L (ref 135–145)
Total Bilirubin: 0.7 mg/dL (ref 0.2–1.2)
Total Protein: 7.2 g/dL (ref 6.0–8.3)

## 2014-12-03 LAB — LIPID PANEL
Cholesterol: 122 mg/dL (ref 0–200)
HDL: 39 mg/dL — ABNORMAL LOW (ref >39)
LDL Cholesterol: 66 mg/dL (ref 0–99)
Total CHOL/HDL Ratio: 3.1 ratio
Triglycerides: 87 mg/dL (ref <150)
VLDL: 17 mg/dL (ref 0–40)

## 2014-12-03 LAB — VITAMIN D 25 HYDROXY (VIT D DEFICIENCY, FRACTURES): Vit D, 25-Hydroxy: 31 ng/mL (ref 30–100)

## 2014-12-03 LAB — HEMOGLOBIN A1C
Hgb A1c MFr Bld: 6.5 % — ABNORMAL HIGH (ref <5.7)
Mean Plasma Glucose: 140 mg/dL — ABNORMAL HIGH (ref <117)

## 2014-12-05 ENCOUNTER — Telehealth: Payer: Self-pay | Admitting: Internal Medicine

## 2014-12-05 NOTE — Telephone Encounter (Signed)
emmi emailed °

## 2014-12-22 ENCOUNTER — Encounter: Payer: Self-pay | Admitting: Internal Medicine

## 2015-01-04 ENCOUNTER — Ambulatory Visit: Payer: Self-pay | Admitting: General Practice

## 2015-01-05 LAB — HM PAP SMEAR: HM Pap smear: NORMAL

## 2015-01-06 LAB — HM DIABETES EYE EXAM

## 2015-01-17 ENCOUNTER — Ambulatory Visit: Payer: Self-pay | Admitting: Specialist

## 2015-01-17 LAB — D-DIMER(ARMC): D-Dimer: 429 ng/ml

## 2015-01-25 ENCOUNTER — Other Ambulatory Visit: Payer: Self-pay | Admitting: Internal Medicine

## 2015-01-27 ENCOUNTER — Encounter: Payer: Self-pay | Admitting: Internal Medicine

## 2015-03-29 ENCOUNTER — Ambulatory Visit: Payer: Self-pay | Admitting: Internal Medicine

## 2015-03-29 LAB — HM MAMMOGRAPHY

## 2015-04-18 ENCOUNTER — Encounter: Payer: Self-pay | Admitting: Internal Medicine

## 2015-04-24 LAB — SURGICAL PATHOLOGY

## 2015-04-29 ENCOUNTER — Emergency Department
Admission: EM | Admit: 2015-04-29 | Discharge: 2015-04-30 | Disposition: A | Discharge status: Home / Self Care | Payer: 59 | Attending: Emergency Medicine | Admitting: Emergency Medicine

## 2015-04-29 DIAGNOSIS — Z79899 Other long term (current) drug therapy: Secondary | ICD-10-CM | POA: Insufficient documentation

## 2015-04-29 DIAGNOSIS — Y998 Other external cause status: Secondary | ICD-10-CM | POA: Diagnosis not present

## 2015-04-29 DIAGNOSIS — W19XXXA Unspecified fall, initial encounter: Secondary | ICD-10-CM

## 2015-04-29 DIAGNOSIS — Y9289 Other specified places as the place of occurrence of the external cause: Secondary | ICD-10-CM | POA: Insufficient documentation

## 2015-04-29 DIAGNOSIS — W01198A Fall on same level from slipping, tripping and stumbling with subsequent striking against other object, initial encounter: Secondary | ICD-10-CM | POA: Diagnosis not present

## 2015-04-29 DIAGNOSIS — S0993XA Unspecified injury of face, initial encounter: Secondary | ICD-10-CM | POA: Diagnosis present

## 2015-04-29 DIAGNOSIS — S0083XA Contusion of other part of head, initial encounter: Secondary | ICD-10-CM | POA: Insufficient documentation

## 2015-04-29 DIAGNOSIS — Y9389 Activity, other specified: Secondary | ICD-10-CM | POA: Diagnosis not present

## 2015-04-29 DIAGNOSIS — Z7982 Long term (current) use of aspirin: Secondary | ICD-10-CM | POA: Diagnosis not present

## 2015-04-29 DIAGNOSIS — Z7951 Long term (current) use of inhaled steroids: Secondary | ICD-10-CM | POA: Insufficient documentation

## 2015-04-29 DIAGNOSIS — T148XXA Other injury of unspecified body region, initial encounter: Secondary | ICD-10-CM

## 2015-04-29 DIAGNOSIS — I10 Essential (primary) hypertension: Secondary | ICD-10-CM | POA: Insufficient documentation

## 2015-04-29 DIAGNOSIS — E119 Type 2 diabetes mellitus without complications: Secondary | ICD-10-CM | POA: Diagnosis not present

## 2015-04-30 MED ORDER — HYDROCODONE-ACETAMINOPHEN 5-325 MG PO TABS: 1.0000 | ORAL_TABLET | ORAL | Status: DC | PRN

## 2015-04-30 MED ORDER — ONDANSETRON HCL 4 MG PO TABS: 4.0000 mg | ORAL_TABLET | Freq: Every day | ORAL | Status: AC | PRN

## 2015-04-30 NOTE — ED Provider Notes (Signed)
[facility] Emergency Department Provider Note ?  ? ____________________________________________ ? Time seen: 0100 ? I have reviewed the triage vital signs and the nursing notes.  ________ HISTORY ? Chief Complaint Fall   HPI  Vanessa Higgins is a 64 y.o. female who presents status post mechanical fall approximately 8PM. Patient states she tripped along the sidewalk and struck her left cheek along the railing. Patient denies loss of consciousness. Rates pain 5 out of 10. Denies nausea, vomiting, dizziness. Denies jaw pain. Denies visual changes. ? ? ? Past Medical History  Diagnosis Date  . Asthma   . Arthritis   . Diabetes mellitus   . Headache(784.0)   . GERD (gastroesophageal reflux disease)   . Allergy     hay fever  . Hypertension   . Hyperlipidemia   . Migraine   . Blood transfusion abn reaction or complication, no procedure mishap   . UTI (lower urinary tract infection)   . Wears glasses   . Ankle fracture, left 04/08/2014    Patient Active Problem List   Diagnosis Date Noted  . Vitamin D deficiency 12/02/2014  . Ankle fracture, left 04/08/2014  . Routine general medical examination at a health care facility 05/03/2013  . Diabetes mellitus type 2, controlled 07/09/2012  . Hypertension 07/09/2012  . Hyperlipidemia 07/09/2012  . Asthma 07/09/2012  . Obesity 07/09/2012   ? Past Surgical History  Procedure Laterality Date  . Appendectomy  1977  . Tonsillectomy and adenoidectomy      as a child  . Tubal ligation  05/1976  . 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 Parotidectomy  1980    left  . Colonoscopy    . Orif ankle fracture Left 04/08/2014    Procedure: OPEN REDUCTION INTERNAL FIXATION (ORIF) ANKLE FRACTURE LEFT ANKLE FRACTURE OPEN TREATMENT BIMALLEOLAR ANKLE INCLUDES INTERNAL FIXATION;  Surgeon: Johnny Bridge, MD;  Location: [place];  Service: Orthopedics;  Laterality: Left;   ? Current Outpatient  Rx  Name  Route  Sig  Dispense  Refill  . ADVAIR DISKUS 250-50 MCG/DOSE AEPB      INHALE 1 PUFF INTO THE LUNGS 2 TIMES DAILY.   60 each   5   . Exenatide ER (BYDUREON) 2 MG SUSR   Subcutaneous   Inject 2 mg into the skin once a week.   1 each   6   . ezetimibe-simvastatin (VYTORIN) 10-20 MG per tablet   Oral   Take 1 tablet by mouth daily at 6 PM.   90 tablet   3   . montelukast (SINGULAIR) 10 MG tablet   Oral   Take 1 tablet (10 mg total) by mouth at bedtime.   90 tablet   3   . omeprazole (PRILOSEC) 20 MG capsule   Oral   Take 1 capsule (20 mg total) by mouth daily.   90 capsule   3     Appt 02/17/14   . ramipril (ALTACE) 10 MG capsule   Oral   Take 1 capsule (10 mg total) by mouth daily.   90 capsule   3   . venlafaxine XR (EFFEXOR-XR) 150 MG 24 hr capsule   Oral   Take 1 capsule (150 mg total) by mouth daily with breakfast.   90 capsule   3   . Verapamil HCl CR 300 MG CP24   Oral   Take 1 capsule (300 mg total) by mouth daily.   [place]  capsule   3   . aspirin 81 MG tablet   Oral   Take 81 mg by mouth daily.         Marland Kitchen b complex vitamins tablet   Oral   Take 1 tablet by mouth daily.         Marland Kitchen loratadine (CLARITIN) 10 MG tablet   Oral   Take 10 mg by mouth daily.         . metFORMIN (GLUCOPHAGE) 1000 MG tablet   Oral   Take 1 tablet (1,000 mg total) by mouth 2 (two) times daily with a meal. Patient taking differently: Take 1,000 mg by mouth 2 (two) times daily with a meal. Takes 2 500 mg tabs by mouth twice a day   60 tablet   5   . Multiple Vitamins-Minerals (CENTRUM SILVER PO)   Oral   Take 1 tablet by mouth daily.         . Omega-3 Fatty Acids (FISH OIL) 1200 MG CAPS   Oral   Take 1 capsule by mouth daily.          ? Allergies Sulfa antibiotics ? Family History  Problem Relation Age of Onset  . Heart disease Mother   . Sudden death Mother   . Cancer Mother 27    pancreatic  . Heart disease Father 45  . Sudden death  Father   . Cancer Brother     colon  . Stroke Maternal Grandmother   . Heart disease Maternal Grandfather   . Cancer Sister     brain   ? Social History History  Substance Use Topics  . Smoking status: Never Smoker   . Smokeless tobacco: Not on file  . Alcohol Use: No   ? Review of Systems  Constitutional: Negative for fever. Eyes: Negative for visual changes.  ENT: Positive for left cheek swelling and abrasion. Negative for dental injury or malocclusion. Cardiovascular: Negative for chest pain. Respiratory: Negative for shortness of breath. Gastrointestinal: Negative for abdominal pain, vomiting and diarrhea. Genitourinary: Negative for dysuria. Musculoskeletal: Negative for back pain. Skin: Negative for rash. Neurological: Negative for headaches, focal weakness or numbness.  10-point ROS otherwise negative.  _______________ PHYSICAL EXAM: ? VITAL SIGNS:   ED Triage Vitals  Enc Vitals Group     BP 04/30/15 0020 123/55 mmHg     Pulse Rate 04/30/15 0020 81     Resp --      Temp --      Temp src --      SpO2 04/30/15 0020 92 %     Weight 04/30/15 0020 207 lb (93.895 kg)     Height 04/30/15 0020 5\' 5"  (1.651 m)     Head Cir --      Peak Flow --      Pain Score 04/30/15 0022 2     Pain Loc --      Pain Edu? --      Excl. in [place]? --    ?  Constitutional: Alert and oriented. Well appearing and in no distress. Eyes: Conjunctivae are normal. PERRL. Normal extraocular movements. ENT left cheek hematoma with minor abrasions      Head: Normocephalic and atraumatic.      Nose: No congestion/rhinnorhea.      Mouth/Throat: Mucous membranes are moist. No dental injury or malocclusion.      Neck: No stridor. No cervical spine tenderness. Hematological/Lymphatic/Immunilogical: No cervical lymphadenopathy. Cardiovascular: Normal rate, regular rhythm. Normal and symmetric distal pulses  are present in all extremities. No murmurs, rubs, or gallops. Respiratory: Normal  respiratory effort without tachypnea nor retractions. Breath sounds are clear and equal bilaterally. No wheezes/rales/rhonchi. Gastrointestinal: Soft and nontender. No distention. No abdominal bruits. There is no CVA tenderness. Genitourinary: Deferred Musculoskeletal: Nontender with normal range of motion in all extremities. No joint effusions. Minor abrasion over left lateral clavicle. No tenderness or deformity noted.       Right lower leg:  No tenderness or edema.      Left lower leg:  No tenderness or edema. Neurologic:  Normal speech and language. No gross focal neurologic deficits are appreciated. Speech is normal. No gait instability. Skin:  Skin is warm, dry and intact. No rash noted. Psychiatric: Mood and affect are normal. Speech and behavior are normal. Patient exhibits appropriate insight and judgment.  ____ EKG  None   ___________ RADIOLOGY  CT maxillofacial per Dr. Jobe Igo: Left-sided facial soft tissue swelling; no acute fractures.  _____________ PROCEDURES ? Procedure(s) performed: None  Critical Care performed: None   ______________________________________________________ INITIAL IMPRESSION / ASSESSMENT AND PLAN / ED COURSE ? Pertinent labs & imaging results that were available during my care of the patient were reviewed by me and considered in my medical decision making (see chart for details).   64 year old female status post mechanical fall with left cheek hematoma and minor abrasions.No LOC; no focal neurologic abnormalities. Will obtain CT maxillofacial to evaluate for facial injury; will administer analgesics and reassess.  0225 Patient improved. Discussed with patient and daughter CT results and given strict return precautions. Both verbalize understanding and agree with plan.   ____________________________________________ FINAL CLINICAL IMPRESSION(S) / ED DIAGNOSES?  Final diagnoses:  Fall, initial encounter  Facial contusion, initial encounter   Abrasion        Paulette Blanch, MD 04/30/15 [phone]

## 2015-04-30 NOTE — Discharge Instructions (Signed)
Abrasion   1. TAKE PAIN & NAUSEA MEDICINES AS NEEDED (NORCO/ZOFRAN #20). 2. APPLY ICE TO AFFECTED AREA SEVERAL TIMES DAILY. 3. RETURN TO THE ER FOR WORSENING SYMPTOMS, PERSISTENT VOMITING, LETHARGY OR OTHER CONCERNS.  An abrasion is a cut or scrape of the skin. Abrasions do not extend through all layers of the skin and most heal within 10 days. It is important to care for your abrasion properly to prevent infection. CAUSES  Most abrasions are caused by falling on, or gliding across, the ground or other surface. When your skin rubs on something, the outer and inner layer of skin rubs off, causing an abrasion. DIAGNOSIS  Your caregiver will be able to diagnose an abrasion during a physical exam.  TREATMENT  Your treatment depends on how large and deep the abrasion is. Generally, your abrasion will be cleaned with water and a mild soap to remove any dirt or debris. An antibiotic ointment may be put over the abrasion to prevent an infection. A bandage (dressing) may be wrapped around the abrasion to keep it from getting dirty.  You may need a tetanus shot if:  You cannot remember when you had your last tetanus shot.  You have never had a tetanus shot.  The injury broke your skin. If you get a tetanus shot, your arm may swell, get red, and feel warm to the touch. This is common and not a problem. If you need a tetanus shot and you choose not to have one, there is a rare chance of getting tetanus. Sickness from tetanus can be serious.  HOME CARE INSTRUCTIONS   If a dressing was applied, change it at least once a day or as directed by your caregiver. If the bandage sticks, soak it off with warm water.   Wash the area with water and a mild soap to remove all the ointment 2 times a day. Rinse off the soap and pat the area dry with a clean towel.   Reapply any ointment as directed by your caregiver. This will help prevent infection and keep the bandage from sticking. Use gauze over the wound and  under the dressing to help keep the bandage from sticking.   Change your dressing right away if it becomes wet or dirty.   Only take over-the-counter or prescription medicines for pain, discomfort, or fever as directed by your caregiver.   Follow up with your caregiver within 24-48 hours for a wound check, or as directed. If you were not given a wound-check appointment, look closely at your abrasion for redness, swelling, or pus. These are signs of infection. SEEK IMMEDIATE MEDICAL CARE IF:   You have increasing pain in the wound.   You have redness, swelling, or tenderness around the wound.   You have pus coming from the wound.   You have a fever or persistent symptoms for more than 2-3 days.  You have a fever and your symptoms suddenly get worse.  You have a bad smell coming from the wound or dressing.  MAKE SURE YOU:   Understand these instructions.  Will watch your condition.  Will get help right away if you are not doing well or get worse. Document Released: 09/25/2005 Document Revised: 12/02/2012 Document Reviewed: 11/19/2011 [facility] Patient Information 2015 [place], [place]. This information is not intended to replace advice given to you by your health care provider. Make sure you discuss any questions you have with your health care provider.  Facial or Scalp Contusion  A facial or scalp  contusion is a deep bruise on the face or head. Contusions happen when an injury causes bleeding under the skin. Signs of bruising include pain, puffiness (swelling), and discolored skin. The contusion may turn blue, purple, or yellow. HOME CARE  Only take medicines as told by your doctor.  Put ice on the injured area.  Put ice in a plastic bag.  Place a towel between your skin and the bag.  Leave the ice on for 20 minutes, 2-3 times a day. GET HELP IF:  You have bite problems.  You have pain when chewing.  You are worried about your face not healing normally. GET HELP  RIGHT AWAY IF:   You have severe pain or a headache and medicine does not help.  You are very tired or confused, or your personality changes.  You throw up (vomit).  You have a nosebleed that will not stop.  You see two of everything (double vision) or have blurry vision.  You have fluid coming from your nose or ear.  You have problems walking or using your arms or legs. MAKE SURE YOU:   Understand these instructions.  Will watch your condition.  Will get help right away if you are not doing well or get worse. Document Released: 12/05/2011 Document Revised: 10/06/2013 Document Reviewed: 07/29/2013 [facility] Patient Information 2015 [place], [place]. This information is not intended to replace advice given to you by your health care provider. Make sure you discuss any questions you have with your health care provider.  Hematoma A hematoma is a collection of blood. The collection of blood can turn into a hard, painful lump under the skin. Your skin may turn blue or yellow if the hematoma is close to the surface of the skin. Most hematomas get better in a few days to weeks. Some hematomas are serious and need medical care. Hematomas can be very small or very big. HOME CARE  Apply ice to the injured area:  Put ice in a plastic bag.  Place a towel between your skin and the bag.  Leave the ice on for 20 minutes, 2-3 times a day for the first 1 to 2 days.  After the first 2 days, switch to using warm packs on the injured area.  Raise (elevate) the injured area to lessen pain and puffiness (swelling). You may also wrap the area with an elastic bandage. Make sure the bandage is not wrapped too tight.  If you have a painful hematoma on your leg or foot, you may use crutches for a couple days.  Only take medicines as told by your doctor. GET HELP RIGHT AWAY IF:   Your pain gets worse.  Your pain is not controlled with medicine.  You have a fever.  Your puffiness gets  worse.  Your skin turns more blue or yellow.  Your skin over the hematoma breaks or starts bleeding.  Your hematoma is in your chest or belly (abdomen) and you are short of breath, feel weak, or have a change in consciousness.  Your hematoma is on your scalp and you have a headache that gets worse or a change in alertness or consciousness. MAKE SURE YOU:   Understand these instructions.  Will watch your condition.  Will get help right away if you are not doing well or get worse. Document Released: 01/23/2005 Document Revised: 08/18/2013 Document Reviewed: 05/26/2013 [facility] Patient Information 2015 [place], [place]. This information is not intended to replace advice given to you by your health care provider. Make  sure you discuss any questions you have with your health care provider.

## 2015-04-30 NOTE — ED Notes (Addendum)
RN applied Ice Pack to left side of face.

## 2015-04-30 NOTE — ED Notes (Addendum)
Apologized and explained reasons for delay.

## 2015-05-08 ENCOUNTER — Ambulatory Visit (INDEPENDENT_AMBULATORY_CARE_PROVIDER_SITE_OTHER): Payer: 59 | Admitting: Nurse Practitioner

## 2015-05-08 ENCOUNTER — Encounter: Payer: Self-pay | Admitting: Nurse Practitioner

## 2015-05-08 VITALS — BP 110/78 | HR 77 | Temp 97.6°F | Resp 14 | Ht 64.75 in | Wt 215.1 lb

## 2015-05-08 DIAGNOSIS — R58 Hemorrhage, not elsewhere classified: Secondary | ICD-10-CM | POA: Diagnosis not present

## 2015-05-08 NOTE — Progress Notes (Signed)
   Subjective:    Patient ID: Vanessa Higgins, female    DOB: 15-Nov-1951, 64 y.o.   MRN: [id number]  HPI  [name] is a 64 yo female following up after an ER visit after a fall.  1) [facility] ER visit on 04/29/15.   No Loss Of Conciousness and fall was due to tripping Right knee > left knee pain.  1 small spot on face is still painful left cheek Pt feels she has improved Does not pain meds or nausea meds   CT maxillofacial found soft tissue swelling and no fractures.   Answered memory questions correctly 3/3   Review of Systems  Constitutional: Negative for fever, chills, diaphoresis and fatigue.  Respiratory: Negative for chest tightness, shortness of breath and wheezing.   Cardiovascular: Negative for chest pain, palpitations and leg swelling.  Gastrointestinal: Negative for nausea, vomiting and diarrhea.  Musculoskeletal: Positive for arthralgias.       Knee pain after fall  Skin: Positive for color change. Negative for rash.       Bruising still evident on face and knees  Neurological: Negative for dizziness, weakness, numbness and headaches.  Psychiatric/Behavioral: The patient is not nervous/anxious.        Objective:   Physical Exam  Constitutional: She is oriented to person, place, and time. She appears well-developed and well-nourished. No distress.  BP 110/78 mmHg  Pulse 77  Temp(Src) 97.6 F (36.4 C) (Oral)  Resp 14  Ht 5' 4.75" (1.645 m)  Wt 215 lb 1.9 oz (97.578 kg)  BMI 36.06 kg/m2  SpO2 96%   HENT:  Head: Normocephalic and atraumatic.  Right Ear: External ear normal.  Left Ear: External ear normal.  Cardiovascular: Normal rate, regular rhythm and normal heart sounds.  Exam reveals no gallop and no friction rub.   No murmur heard. Pulmonary/Chest: Effort normal and breath sounds normal. No respiratory distress. She has no wheezes. She has no rales. She exhibits no tenderness.  Neurological: She is alert and oriented to person, place, and time. No cranial  nerve deficit. She exhibits normal muscle tone. Coordination normal.  Skin: Skin is warm and dry. No rash noted. She is not diaphoretic.  Hematoma on left cheek, pt reports it has improved, yellow/brown ecchymosis, bilateral knees scabbed, ecchymosis yellow/brown,   Psychiatric: She has a normal mood and affect. Her behavior is normal. Judgment and thought content normal.      Assessment & Plan:

## 2015-05-08 NOTE — Progress Notes (Signed)
Pre visit review using our clinic review tool, if applicable. No additional management support is needed unless otherwise documented below in the visit note. 

## 2015-05-14 DIAGNOSIS — R58 Hemorrhage, not elsewhere classified: Secondary | ICD-10-CM | POA: Insufficient documentation

## 2015-05-14 NOTE — Assessment & Plan Note (Signed)
Ecchymosis and hematoma on left cheek after falling. Pt reports improvement and nothing to do at this time except heal. 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 FU prn worsening/failure to improve.

## 2015-06-07 ENCOUNTER — Ambulatory Visit (INDEPENDENT_AMBULATORY_CARE_PROVIDER_SITE_OTHER): Payer: 59 | Admitting: Internal Medicine

## 2015-06-07 ENCOUNTER — Encounter: Payer: Self-pay | Admitting: Internal Medicine

## 2015-06-07 VITALS — BP 127/75 | HR 72 | Temp 98.1°F | Ht 64.75 in | Wt 213.5 lb

## 2015-06-07 DIAGNOSIS — E785 Hyperlipidemia, unspecified: Secondary | ICD-10-CM | POA: Diagnosis not present

## 2015-06-07 DIAGNOSIS — I10 Essential (primary) hypertension: Secondary | ICD-10-CM | POA: Diagnosis not present

## 2015-06-07 DIAGNOSIS — E669 Obesity, unspecified: Secondary | ICD-10-CM | POA: Diagnosis not present

## 2015-06-07 DIAGNOSIS — E119 Type 2 diabetes mellitus without complications: Secondary | ICD-10-CM | POA: Diagnosis not present

## 2015-06-07 LAB — COMPREHENSIVE METABOLIC PANEL
ALT: 30 U/L (ref 0–35)
AST: 29 U/L (ref 0–37)
Albumin: 4.1 g/dL (ref 3.5–5.2)
Alkaline Phosphatase: 43 U/L (ref 39–117)
BUN: 14 mg/dL (ref 6–23)
CO2: 29 mEq/L (ref 19–32)
Calcium: 9.7 mg/dL (ref 8.4–10.5)
Chloride: 102 mEq/L (ref 96–112)
Creatinine, Ser: 0.66 mg/dL (ref 0.40–1.20)
GFR: 95.28 mL/min (ref >60.00)
Glucose, Bld: 119 mg/dL — ABNORMAL HIGH (ref 70–99)
Potassium: 4.6 mEq/L (ref 3.5–5.1)
Sodium: 137 mEq/L (ref 135–145)
Total Bilirubin: 0.8 mg/dL (ref 0.2–1.2)
Total Protein: 7.2 g/dL (ref 6.0–8.3)

## 2015-06-07 LAB — LIPID PANEL
Cholesterol: 136 mg/dL (ref 0–200)
HDL: 42.1 mg/dL (ref >39.00)
LDL Cholesterol: 74 mg/dL (ref 0–99)
NonHDL: 93.9
Total CHOL/HDL Ratio: 3
Triglycerides: 101 mg/dL (ref 0.0–149.0)
VLDL: 20.2 mg/dL (ref 0.0–40.0)

## 2015-06-07 LAB — MICROALBUMIN / CREATININE URINE RATIO
Creatinine,U: 84.4 mg/dL
Microalb Creat Ratio: 0.8 mg/g (ref 0.0–30.0)
Microalb, Ur: <0.7 mg/dL (ref 0.0–1.9)

## 2015-06-07 LAB — HEMOGLOBIN A1C: Hgb A1c MFr Bld: 6.6 % — ABNORMAL HIGH (ref 4.6–6.5)

## 2015-06-07 LAB — HM DIABETES FOOT EXAM: HM Diabetic Foot Exam: NORMAL

## 2015-06-07 NOTE — Assessment & Plan Note (Signed)
Wt Readings from Last 3 Encounters:  06/07/15 213 lb 8 oz (96.843 kg)  05/08/15 215 lb 1.9 oz (97.578 kg)  04/30/15 207 lb (93.895 kg)   Body mass index is 35.79 kg/(m^2). Encouraged healthy diet and exercise.

## 2015-06-07 NOTE — Progress Notes (Signed)
Subjective:    Patient ID: Vanessa Higgins, female    DOB: Aug 17, 1951, 64 y.o.   MRN: [id number]  HPI  64YO female presents for follow up.  DM - BG mostly 120-160. Compliant with medication. No missed doses.  HTN - Compliant with medications. No CP, HA, palpitations.  Seen by urology for recurrent UTI. Started on nightly antibiotic to prevent infections. No infections after starting.  Past medical, surgical, family and social history per today's encounter.  Review of Systems  Constitutional: Negative for fever, chills, appetite change, fatigue and unexpected weight change.  Eyes: Negative for visual disturbance.  Respiratory: Negative for shortness of breath.   Cardiovascular: Negative for chest pain and leg swelling.  Gastrointestinal: Negative for nausea, vomiting, abdominal pain, diarrhea and constipation.  Musculoskeletal: Negative for myalgias and arthralgias.  Skin: Negative for color change and rash.  Hematological: Negative for adenopathy. Does not bruise/bleed easily.  Psychiatric/Behavioral: Negative for suicidal ideas, sleep disturbance and dysphoric mood. The patient is not nervous/anxious.        Objective:    BP 127/75 mmHg  Pulse 72  Temp(Src) 98.1 F (36.7 C) (Oral)  Ht 5' 4.75" (1.645 m)  Wt 213 lb 8 oz (96.843 kg)  BMI 35.79 kg/m2  SpO2 92% Physical Exam  Constitutional: She is oriented to person, place, and time. She appears well-developed and well-nourished. No distress.  HENT:  Head: Normocephalic and atraumatic.  Right Ear: External ear normal.  Left Ear: External ear normal.  Nose: Nose normal.  Mouth/Throat: Oropharynx is clear and moist. No oropharyngeal exudate.  Eyes: Conjunctivae are normal. Pupils are equal, round, and reactive to light. Right eye exhibits no discharge. Left eye exhibits no discharge. No scleral icterus.  Neck: Normal range of motion. Neck supple. No tracheal deviation present. No thyromegaly present.  Cardiovascular:  Normal rate, regular rhythm, normal heart sounds and intact distal pulses.  Exam reveals no gallop and no friction rub.   No murmur heard. Pulmonary/Chest: Effort normal and breath sounds normal. No respiratory distress. She has no wheezes. She has no rales. She exhibits no tenderness.  Musculoskeletal: Normal range of motion. She exhibits no edema or tenderness.  Lymphadenopathy:    She has no cervical adenopathy.  Neurological: She is alert and oriented to person, place, and time. No cranial nerve deficit. She exhibits normal muscle tone. Coordination normal.  Skin: Skin is warm and dry. No rash noted. She is not diaphoretic. No erythema. No pallor.  Psychiatric: She has a normal mood and affect. Her behavior is normal. Judgment and thought content normal.          Assessment & Plan:   Problem List Items Addressed This Visit      Unprioritized   Diabetes mellitus type 2, controlled - Primary    Will check A1c with labs. Continue Metformin, Invokana, and Bydureon.      Relevant Medications   canagliflozin (INVOKANA) 300 MG TABS tablet   Other Relevant Orders   Comprehensive metabolic panel   Hemoglobin A1c   Lipid panel   Microalbumin / creatinine urine ratio   Hyperlipidemia    Will check lipids and LFTs with labs. Continue Atorvastatin.      Hypertension    BP Readings from Last 3 Encounters:  06/07/15 127/75  05/08/15 110/78  04/30/15 128/70   BP well controlled. Continue current medications. Renal function with labs.      Obesity    Wt Readings from Last 3 Encounters:  06/07/15 213 lb  8 oz (96.843 kg)  05/08/15 215 lb 1.9 oz (97.578 kg)  04/30/15 207 lb (93.895 kg)   Body mass index is 35.79 kg/(m^2). Encouraged healthy diet and exercise.      Relevant Medications   canagliflozin (INVOKANA) 300 MG TABS tablet       Return in about 6 months (around 12/07/2015) for Wellness Visit.

## 2015-06-07 NOTE — Assessment & Plan Note (Signed)
Will check A1c with labs. Continue Metformin, Invokana, and Bydureon.

## 2015-06-07 NOTE — Progress Notes (Signed)
Pre visit review using our clinic review tool, if applicable. No additional management support is needed unless otherwise documented below in the visit note. 

## 2015-06-07 NOTE — Assessment & Plan Note (Signed)
Will check lipids and LFTs with labs. Continue Atorvastatin. 

## 2015-06-07 NOTE — Patient Instructions (Signed)
Labs today.   Follow up in 3 months.  

## 2015-06-07 NOTE — Assessment & Plan Note (Signed)
BP Readings from Last 3 Encounters:  06/07/15 127/75  05/08/15 110/78  04/30/15 128/70   BP well controlled. Continue current medications. Renal function with labs.

## 2015-06-08 ENCOUNTER — Other Ambulatory Visit: Payer: Self-pay | Admitting: Internal Medicine

## 2015-06-22 ENCOUNTER — Other Ambulatory Visit: Payer: Self-pay | Admitting: Internal Medicine

## 2015-07-13 ENCOUNTER — Other Ambulatory Visit: Payer: Self-pay | Admitting: Internal Medicine

## 2015-08-14 ENCOUNTER — Other Ambulatory Visit: Payer: Self-pay | Admitting: Internal Medicine

## 2015-10-16 ENCOUNTER — Ambulatory Visit: Payer: Self-pay | Admitting: Physician Assistant

## 2015-10-16 ENCOUNTER — Other Ambulatory Visit: Payer: Self-pay | Admitting: Physician Assistant

## 2015-10-16 VITALS — BP 130/80 | Temp 98.1°F

## 2015-10-16 DIAGNOSIS — R300 Dysuria: Secondary | ICD-10-CM

## 2015-10-16 LAB — POCT URINALYSIS DIPSTICK
Bilirubin, UA: NEGATIVE
Nitrite, UA: POSITIVE
Spec Grav, UA: 1.025
Urobilinogen, UA: 0.2
pH, UA: 5.5

## 2015-10-16 MED ORDER — PHENAZOPYRIDINE HCL 200 MG PO TABS: 200.0000 mg | ORAL_TABLET | Freq: Three times a day (TID) | ORAL | Status: DC | PRN

## 2015-10-16 MED ORDER — CIPROFLOXACIN HCL 500 MG PO TABS: 500.0000 mg | ORAL_TABLET | Freq: Two times a day (BID) | ORAL | Status: DC

## 2015-10-16 MED ORDER — CIPROFLOXACIN-DEXAMETHASONE 0.3-0.1 % OT SUSP: 4.0000 [drp] | Freq: Two times a day (BID) | OTIC | Status: DC

## 2015-10-16 NOTE — Progress Notes (Signed)
  Dysuria Subjective:    Patient ID: Vanessa Higgins, female    DOB: 05-19-1951, 64 y.o.   MRN: [id number]  HPI Patient c/o 3 days of dysuria, frequency, and urgency.  Denies Flank pain, fever, or vaginal discharge.Marland Kitchen No palliative measure for compliant.    Review of Systems Hypertension,Diabetes, hyperlipidema, and obesity.     Objective:   Physical Exam   Dip UA reveal 1+leukocytes and positive nitrate.      Assessment & Plan:  UTI. Cipro and Pyridium as dircted.  Follow up in 10 days.

## 2015-10-26 ENCOUNTER — Telehealth: Payer: Self-pay

## 2015-11-27 ENCOUNTER — Telehealth: Payer: Self-pay

## 2015-12-06 ENCOUNTER — Ambulatory Visit (INDEPENDENT_AMBULATORY_CARE_PROVIDER_SITE_OTHER): Payer: 59 | Admitting: Internal Medicine

## 2015-12-06 ENCOUNTER — Encounter: Payer: Self-pay | Admitting: Internal Medicine

## 2015-12-06 VITALS — BP 115/50 | HR 76 | Temp 99.2°F | Ht 64.75 in | Wt 215.5 lb

## 2015-12-06 DIAGNOSIS — E669 Obesity, unspecified: Secondary | ICD-10-CM | POA: Diagnosis not present

## 2015-12-06 DIAGNOSIS — I10 Essential (primary) hypertension: Secondary | ICD-10-CM | POA: Diagnosis not present

## 2015-12-06 DIAGNOSIS — E119 Type 2 diabetes mellitus without complications: Secondary | ICD-10-CM | POA: Diagnosis not present

## 2015-12-06 DIAGNOSIS — Z0000 Encounter for general adult medical examination without abnormal findings: Secondary | ICD-10-CM

## 2015-12-06 LAB — CBC WITH DIFFERENTIAL/PLATELET
Basophils Absolute: 0 10*3/uL (ref 0.0–0.1)
Basophils Relative: 0.6 % (ref 0.0–3.0)
Eosinophils Absolute: 0.5 10*3/uL (ref 0.0–0.7)
Eosinophils Relative: 7.2 % — ABNORMAL HIGH (ref 0.0–5.0)
HCT: 40.6 % (ref 36.0–46.0)
Hemoglobin: 13.1 g/dL (ref 12.0–15.0)
Lymphocytes Relative: 33.9 % (ref 12.0–46.0)
Lymphs Abs: 2.2 10*3/uL (ref 0.7–4.0)
MCHC: 32.2 g/dL (ref 30.0–36.0)
MCV: 88.6 fl (ref 78.0–100.0)
Monocytes Absolute: 0.5 10*3/uL (ref 0.1–1.0)
Monocytes Relative: 7.9 % (ref 3.0–12.0)
Neutro Abs: 3.3 10*3/uL (ref 1.4–7.7)
Neutrophils Relative %: 50.4 % (ref 43.0–77.0)
Platelets: 329 10*3/uL (ref 150.0–400.0)
RBC: 4.58 Mil/uL (ref 3.87–5.11)
RDW: 15.5 % (ref 11.5–15.5)
WBC: 6.6 10*3/uL (ref 4.0–10.5)

## 2015-12-06 LAB — LIPID PANEL
Cholesterol: 163 mg/dL (ref 0–200)
HDL: 41.5 mg/dL (ref >39.00)
LDL Cholesterol: 96 mg/dL (ref 0–99)
NonHDL: 121.69
Total CHOL/HDL Ratio: 4
Triglycerides: 128 mg/dL (ref 0.0–149.0)
VLDL: 25.6 mg/dL (ref 0.0–40.0)

## 2015-12-06 LAB — COMPREHENSIVE METABOLIC PANEL
ALT: 38 U/L — ABNORMAL HIGH (ref 0–35)
AST: 40 U/L — ABNORMAL HIGH (ref 0–37)
Albumin: 4.1 g/dL (ref 3.5–5.2)
Alkaline Phosphatase: 42 U/L (ref 39–117)
BUN: 17 mg/dL (ref 6–23)
CO2: 27 mEq/L (ref 19–32)
Calcium: 9.6 mg/dL (ref 8.4–10.5)
Chloride: 102 mEq/L (ref 96–112)
Creatinine, Ser: 0.62 mg/dL (ref 0.40–1.20)
GFR: 102.25 mL/min (ref >60.00)
Glucose, Bld: 108 mg/dL — ABNORMAL HIGH (ref 70–99)
Potassium: 4.4 mEq/L (ref 3.5–5.1)
Sodium: 139 mEq/L (ref 135–145)
Total Bilirubin: 1.2 mg/dL (ref 0.2–1.2)
Total Protein: 7.2 g/dL (ref 6.0–8.3)

## 2015-12-06 LAB — MICROALBUMIN / CREATININE URINE RATIO
Creatinine,U: 104.2 mg/dL
Microalb Creat Ratio: 0.8 mg/g (ref 0.0–30.0)
Microalb, Ur: 0.8 mg/dL (ref 0.0–1.9)

## 2015-12-06 LAB — TSH: TSH: 0.65 u[IU]/mL (ref 0.35–4.50)

## 2015-12-06 LAB — VITAMIN D 25 HYDROXY (VIT D DEFICIENCY, FRACTURES): VITD: 25.4 ng/mL — ABNORMAL LOW (ref 30.00–100.00)

## 2015-12-06 LAB — HEMOGLOBIN A1C: Hgb A1c MFr Bld: 7 % — ABNORMAL HIGH (ref 4.6–6.5)

## 2015-12-06 LAB — HM MAMMOGRAPHY: HM Mammogram: NORMAL

## 2015-12-06 NOTE — Patient Instructions (Signed)
Health Maintenance, Female Adopting a healthy lifestyle and getting preventive care can go a long way to promote health and wellness. Talk with your health care provider about what schedule of regular examinations is right for you. This is a good chance for you to check in with your provider about disease prevention and staying healthy. In between checkups, there are plenty of things you can do on your own. Experts have done a lot of research about which lifestyle changes and preventive measures are most likely to keep you healthy. Ask your health care provider for more information. WEIGHT AND DIET  Eat a healthy diet  Be sure to include plenty of vegetables, fruits, low-fat dairy products, and lean protein.  Do not eat a lot of foods high in solid fats, added sugars, or salt.  Get regular exercise. This is one of the most important things you can do for your health.  Most adults should exercise for at least 150 minutes each week. The exercise should increase your heart rate and make you sweat (moderate-intensity exercise).  Most adults should also do strengthening exercises at least twice a week. This is in addition to the moderate-intensity exercise.  Maintain a healthy weight  Body mass index (BMI) is a measurement that can be used to identify possible weight problems. It estimates body fat based on height and weight. Your health care provider can help determine your BMI and help you achieve or maintain a healthy weight.  For females 20 years of age and older:   A BMI below 18.5 is considered underweight.  A BMI of 18.5 to 24.9 is normal.  A BMI of 25 to 29.9 is considered overweight.  A BMI of 30 and above is considered obese.  Watch levels of cholesterol and blood lipids  You should start having your blood tested for lipids and cholesterol at 66 years of age, then have this test every 5 years.  You may need to have your cholesterol levels checked more often if:  Your lipid  or cholesterol levels are high.  You are older than 66 years of age.  You are at high risk for heart disease.  CANCER SCREENING   Lung Cancer  Lung cancer screening is recommended for adults 55-80 years old who are at high risk for lung cancer because of a history of smoking.  A yearly low-dose CT scan of the lungs is recommended for people who:  Currently smoke.  Have quit within the past 15 years.  Have at least a 30-pack-year history of smoking. A pack year is smoking an average of one pack of cigarettes a day for 1 year.  Yearly screening should continue until it has been 15 years since you quit.  Yearly screening should stop if you develop a health problem that would prevent you from having lung cancer treatment.  Breast Cancer  Practice breast self-awareness. This means understanding how your breasts normally appear and feel.  It also means doing regular breast self-exams. Let your health care provider know about any changes, no matter how small.  If you are in your 20s or 30s, you should have a clinical breast exam (CBE) by a health care provider every 1-3 years as part of a regular health exam.  If you are 40 or older, have a CBE every year. Also consider having a breast X-ray (mammogram) every year.  If you have a family history of breast cancer, talk to your health care provider about genetic screening.  If you   are at high risk for breast cancer, talk to your health care provider about having an MRI and a mammogram every year.  Breast cancer gene (BRCA) assessment is recommended for women who have family members with BRCA-related cancers. BRCA-related cancers include:  Breast.  Ovarian.  Tubal.  Peritoneal cancers.  Results of the assessment will determine the need for genetic counseling and BRCA1 and BRCA2 testing. Cervical Cancer Your health care provider may recommend that you be screened regularly for cancer of the pelvic organs (ovaries, uterus, and  vagina). This screening involves a pelvic examination, including checking for microscopic changes to the surface of your cervix (Pap test). You may be encouraged to have this screening done every 3 years, beginning at age 21.  For women ages 30-65, health care providers may recommend pelvic exams and Pap testing every 3 years, or they may recommend the Pap and pelvic exam, combined with testing for human papilloma virus (HPV), every 5 years. Some types of HPV increase your risk of cervical cancer. Testing for HPV may also be done on women of any age with unclear Pap test results.  Other health care providers may not recommend any screening for nonpregnant women who are considered low risk for pelvic cancer and who do not have symptoms. Ask your health care provider if a screening pelvic exam is right for you.  If you have had past treatment for cervical cancer or a condition that could lead to cancer, you need Pap tests and screening for cancer for at least 20 years after your treatment. If Pap tests have been discontinued, your risk factors (such as having a new sexual partner) need to be reassessed to determine if screening should resume. Some women have medical problems that increase the chance of getting cervical cancer. In these cases, your health care provider may recommend more frequent screening and Pap tests. Colorectal Cancer  This type of cancer can be detected and often prevented.  Routine colorectal cancer screening usually begins at 66 years of age and continues through 66 years of age.  Your health care provider may recommend screening at an earlier age if you have risk factors for colon cancer.  Your health care provider may also recommend using home test kits to check for hidden blood in the stool.  A small camera at the end of a tube can be used to examine your colon directly (sigmoidoscopy or colonoscopy). This is done to check for the earliest forms of colorectal  cancer.  Routine screening usually begins at age 50.  Direct examination of the colon should be repeated every 5-10 years through 66 years of age. However, you may need to be screened more often if early forms of precancerous polyps or small growths are found. Skin Cancer  Check your skin from head to toe regularly.  Tell your health care provider about any new moles or changes in moles, especially if there is a change in a mole's shape or color.  Also tell your health care provider if you have a mole that is larger than the size of a pencil eraser.  Always use sunscreen. Apply sunscreen liberally and repeatedly throughout the day.  Protect yourself by wearing long sleeves, pants, a wide-brimmed hat, and sunglasses whenever you are outside. HEART DISEASE, DIABETES, AND HIGH BLOOD PRESSURE   High blood pressure causes heart disease and increases the risk of stroke. High blood pressure is more likely to develop in:  People who have blood pressure in the high end   of the normal range (130-139/85-89 mm Hg).  People who are overweight or obese.  People who are African American.  If you are 38-23 years of age, have your blood pressure checked every 3-5 years. If you are 61 years of age or older, have your blood pressure checked every year. You should have your blood pressure measured twice--once when you are at a hospital or clinic, and once when you are not at a hospital or clinic. Record the average of the two measurements. To check your blood pressure when you are not at a hospital or clinic, you can use:  An automated blood pressure machine at a pharmacy.  A home blood pressure monitor.  If you are between 45 years and 39 years old, ask your health care provider if you should take aspirin to prevent strokes.  Have regular diabetes screenings. This involves taking a blood sample to check your fasting blood sugar level.  If you are at a normal weight and have a low risk for diabetes,  have this test once every three years after 66 years of age.  If you are overweight and have a high risk for diabetes, consider being tested at a younger age or more often. PREVENTING INFECTION  Hepatitis B  If you have a higher risk for hepatitis B, you should be screened for this virus. You are considered at high risk for hepatitis B if:  You were born in a country where hepatitis B is common. Ask your health care provider which countries are considered high risk.  Your parents were born in a high-risk country, and you have not been immunized against hepatitis B (hepatitis B vaccine).  You have HIV or AIDS.  You use needles to inject street drugs.  You live with someone who has hepatitis B.  You have had sex with someone who has hepatitis B.  You get hemodialysis treatment.  You take certain medicines for conditions, including cancer, organ transplantation, and autoimmune conditions. Hepatitis C  Blood testing is recommended for:  Everyone born from [birth year] through 1967.  Anyone with known risk factors for hepatitis C. Sexually transmitted infections (STIs)  You should be screened for sexually transmitted infections (STIs) including gonorrhea and chlamydia if:  You are sexually active and are younger than 66 years of age.  You are older than 66 years of age and your health care provider tells you that you are at risk for this type of infection.  Your sexual activity has changed since you were last screened and you are at an increased risk for chlamydia or gonorrhea. Ask your health care provider if you are at risk.  If you do not have HIV, but are at risk, it may be recommended that you take a prescription medicine daily to prevent HIV infection. This is called pre-exposure prophylaxis (PrEP). You are considered at risk if:  You are sexually active and do not regularly use condoms or know the HIV status of your partner(s).  You take drugs by injection.  You are sexually  active with a partner who has HIV. Talk with your health care provider about whether you are at high risk of being infected with HIV. If you choose to begin PrEP, you should first be tested for HIV. You should then be tested every 3 months for as long as you are taking PrEP.  PREGNANCY   If you are premenopausal and you may become pregnant, ask your health care provider about preconception counseling.  If you may  become pregnant, take 400 to 800 micrograms (mcg) of folic acid every day.  If you want to prevent pregnancy, talk to your health care provider about birth control (contraception). OSTEOPOROSIS AND MENOPAUSE   Osteoporosis is a disease in which the bones lose minerals and strength with aging. This can result in serious bone fractures. Your risk for osteoporosis can be identified using a bone density scan.  If you are 61 years of age or older, or if you are at risk for osteoporosis and fractures, ask your health care provider if you should be screened.  Ask your health care provider whether you should take a calcium or vitamin D supplement to lower your risk for osteoporosis.  Menopause may have certain physical symptoms and risks.  Hormone replacement therapy may reduce some of these symptoms and risks. Talk to your health care provider about whether hormone replacement therapy is right for you.  HOME CARE INSTRUCTIONS   Schedule regular health, dental, and eye exams.  Stay current with your immunizations.   Do not use any tobacco products including cigarettes, chewing tobacco, or electronic cigarettes.  If you are pregnant, do not drink alcohol.  If you are breastfeeding, limit how much and how often you drink alcohol.  Limit alcohol intake to no more than 1 drink per day for nonpregnant women. One drink equals 12 ounces of beer, 5 ounces of wine, or 1 ounces of hard liquor.  Do not use street drugs.  Do not share needles.  Ask your health care provider for help if  you need support or information about quitting drugs.  Tell your health care provider if you often feel depressed.  Tell your health care provider if you have ever been abused or do not feel safe at home.   This information is not intended to replace advice given to you by your health care provider. Make sure you discuss any questions you have with your health care provider.   Document Released: 07/01/2011 Document Revised: 01/06/2015 Document Reviewed: 11/17/2013 Elsevier Interactive Patient Education [employer].

## 2015-12-06 NOTE — Progress Notes (Signed)
Subjective:    Patient ID: Vanessa Higgins, female    DOB: 15-Mar-1951, 64 y.o.   MRN: [id number]  HPI  64YO female presents for physical.  Feeling well. Seen by Dr. Eddie Dibbles. Changed to Trulicity. BG have been well controlled. No concerns today.    Wt Readings from Last 3 Encounters:  12/06/15 215 lb 8 oz (97.75 kg)  06/07/15 213 lb 8 oz (96.843 kg)  05/08/15 215 lb 1.9 oz (97.578 kg)   BP Readings from Last 3 Encounters:  12/06/15 115/50  10/16/15 130/80  06/07/15 127/75    Past Medical History  Diagnosis Date  . Asthma   . Arthritis   . Diabetes mellitus   . Headache(784.0)   . GERD (gastroesophageal reflux disease)   . Allergy     hay fever  . Hypertension   . Hyperlipidemia   . Migraine   . Blood transfusion abn reaction or complication, no procedure mishap   . UTI (lower urinary tract infection)   . Wears glasses   . Ankle fracture, left 04/08/2014   Family History  Problem Relation Age of Onset  . Heart disease Mother   . Sudden death Mother   . Cancer Mother 52    pancreatic  . Heart disease Father 16  . Sudden death Father   . Cancer Brother     colon  . Stroke Maternal Grandmother   . Heart disease Maternal Grandfather   . Cancer Sister     brain   Past Surgical History  Procedure Laterality Date  . Appendectomy  04-26-1976  . Tonsillectomy and adenoidectomy      as a child  . Tubal ligation  05/1976  . 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 Parotidectomy  1980    left  . Colonoscopy    . Orif ankle fracture Left 04/08/2014    Procedure: OPEN REDUCTION INTERNAL FIXATION (ORIF) ANKLE FRACTURE LEFT ANKLE FRACTURE OPEN TREATMENT BIMALLEOLAR ANKLE INCLUDES INTERNAL FIXATION;  Surgeon: Johnny Bridge, MD;  Location: [facility];  Service: Orthopedics;  Laterality: Left;   Social History   Social History  . Marital Status: Widowed    Spouse Name: N/A  . Number of Children: N/A  . Years of Education: N/A   Social History Main  Topics  . Smoking status: Never Smoker   . Smokeless tobacco: None  . Alcohol Use: No  . Drug Use: No  . Sexual Activity: Not Asked   Other Topics Concern  . None   Social History Narrative   Lives in [place] with daughter and twin grandchildren 9YO. Husband deceased Apr 26, 2008.      Work - [employer]   Diet - healthy   Exercise - gym 2 x per week    Review of Systems  Constitutional: Negative for fever, chills, appetite change, fatigue and unexpected weight change.  Eyes: Negative for visual disturbance.  Respiratory: Negative for shortness of breath.   Cardiovascular: Negative for chest pain and leg swelling.  Gastrointestinal: Negative for nausea, vomiting, abdominal pain, diarrhea and constipation.  Genitourinary: Negative for dysuria, urgency, hematuria and dyspareunia.  Musculoskeletal: Negative for myalgias and arthralgias.  Skin: Negative for color change and rash.  Neurological: Negative for weakness.  Hematological: Negative for adenopathy. Does not bruise/bleed easily.  Psychiatric/Behavioral: Negative for sleep disturbance and dysphoric mood. The patient is not nervous/anxious.        Objective:    BP 115/50 mmHg  Pulse 76  Temp(Src) 99.2 F (37.3 C) (Oral)  Ht 5' 4.75" (1.645 m)  Wt 215 lb 8 oz (97.75 kg)  BMI 36.12 kg/m2  SpO2 93% Physical Exam  Constitutional: She is oriented to person, place, and time. She appears well-developed and well-nourished. No distress.  HENT:  Head: Normocephalic and atraumatic.  Right Ear: External ear normal.  Left Ear: External ear normal.  Nose: Nose normal.  Mouth/Throat: Oropharynx is clear and moist. No oropharyngeal exudate.  Eyes: Conjunctivae and EOM are normal. Pupils are equal, round, and reactive to light. Right eye exhibits no discharge.  Neck: Normal range of motion. Neck supple. No thyromegaly present.  Cardiovascular: Normal rate, regular rhythm, normal heart sounds and intact distal pulses.  Exam reveals no  gallop and no friction rub.   No murmur heard. Pulmonary/Chest: Effort normal. No respiratory distress. She has no wheezes. She has no rales.  Abdominal: Soft. Bowel sounds are normal. She exhibits no distension and no mass. There is no tenderness. There is no rebound and no guarding.  Musculoskeletal: Normal range of motion. She exhibits no edema or tenderness.  Lymphadenopathy:    She has no cervical adenopathy.  Neurological: She is alert and oriented to person, place, and time. No cranial nerve deficit. Coordination normal.  Skin: Skin is warm and dry. No rash noted. She is not diaphoretic. No erythema. No pallor.  Psychiatric: She has a normal mood and affect. Her behavior is normal. Judgment and thought content normal.          Assessment & Plan:   Problem List Items Addressed This Visit      Unprioritized   Diabetes mellitus type 2, controlled ([place])    Reviewed notes from Dr. Eddie Dibbles. A1c with labs today.      Relevant Medications   Dulaglutide (TRULICITY) 1.5 0000000 SOPN   Other Relevant Orders   Comprehensive metabolic panel   Lipid panel   Hemoglobin A1c   Ambulatory referral to diabetic education   Hypertension    BP Readings from Last 3 Encounters:  12/06/15 115/50  10/16/15 130/80  06/07/15 127/75   BP well controlled. Renal function with labs. Continue current medication.      Obesity    Wt Readings from Last 3 Encounters:  12/06/15 215 lb 8 oz (97.75 kg)  06/07/15 213 lb 8 oz (96.843 kg)  05/08/15 215 lb 1.9 oz (97.578 kg)   Body mass index is 36.12 kg/(m^2). The patient is asked to make an attempt to improve diet and exercise patterns to aid in medical management of this problem. Referral placed to [facility].      Relevant Medications   Dulaglutide (TRULICITY) 1.5 0000000 SOPN   Routine general medical examination at a health care facility - Primary    General medical exam normal today. Breast and pelvic exam deferred as completed by OB.  Mammogram UTD and reviewed. Colonoscopy UTD. Immunizations UTD. Labs today. Encouraged healthy diet and exercise. Referral placed to [facility] for nutrition counseling.      Relevant Orders   CBC with Differential/Platelet   VITAMIN D 25 Hydroxy (Vit-D Deficiency, Fractures)   Microalbumin / creatinine urine ratio   TSH       Return in about 6 months (around 06/05/2016) for Recheck of Diabetes.

## 2015-12-06 NOTE — Assessment & Plan Note (Signed)
General medical exam normal today. Breast and pelvic exam deferred as completed by OB. Mammogram UTD and reviewed. Colonoscopy UTD. Immunizations UTD. Labs today. Encouraged healthy diet and exercise. Referral placed to [facility] for nutrition counseling.

## 2015-12-06 NOTE — Progress Notes (Signed)
Pre visit review using our clinic review tool, if applicable. No additional management support is needed unless otherwise documented below in the visit note. 

## 2015-12-06 NOTE — Assessment & Plan Note (Signed)
BP Readings from Last 3 Encounters:  12/06/15 115/50  10/16/15 130/80  06/07/15 127/75   BP well controlled. Renal function with labs. Continue current medication.

## 2015-12-06 NOTE — Assessment & Plan Note (Signed)
Reviewed notes from Dr. Eddie Dibbles. A1c with labs today.

## 2015-12-06 NOTE — Assessment & Plan Note (Signed)
Wt Readings from Last 3 Encounters:  12/06/15 215 lb 8 oz (97.75 kg)  06/07/15 213 lb 8 oz (96.843 kg)  05/08/15 215 lb 1.9 oz (97.578 kg)   Body mass index is 36.12 kg/(m^2). The patient is asked to make an attempt to improve diet and exercise patterns to aid in medical management of this problem. Referral placed to [facility].

## 2016-01-09 ENCOUNTER — Other Ambulatory Visit: Payer: 59

## 2016-01-09 ENCOUNTER — Other Ambulatory Visit: Payer: Self-pay

## 2016-01-09 NOTE — Patient Outreach (Signed)
[facility]) Care Management  01/09/2016  CHARNELL BRIGHAM [birth year] [id number]  Called patient at home to encourage her to schedule with [facility]- she was not available and her grandson wanted me to contact her on her cell phone.   Gentry Fitz, RN, BA, [place], [place] Direct Dial:  [phone]  Fax:  [phone] E-mail: [email or web address] [address], [place], [place]  [address]

## 2016-01-09 NOTE — Patient Outreach (Signed)
[facility]) Care Management  01/09/2016  NINFA SEARCH August 16, 1951 [id number]   I have left a message with Ludwika to call [facility] to schedule a follow up visit. She has not been returning [facility] calls.    Gentry Fitz, RN, BA, [place], [place] Direct Dial:  [phone]  Fax:  [phone] E-mail: [email or web address] [address], [place], [place]  [address]

## 2016-01-17 ENCOUNTER — Telehealth: Payer: Self-pay | Admitting: Internal Medicine

## 2016-01-17 ENCOUNTER — Other Ambulatory Visit: Payer: Self-pay | Admitting: Pharmacist

## 2016-01-17 NOTE — Telephone Encounter (Signed)
I would recommend that we repeat CMP in 4 weeks for elevated LFTs.

## 2016-01-17 NOTE — Patient Instructions (Signed)

## 2016-01-17 NOTE — Telephone Encounter (Signed)
LMOM to call office back about specific labs she wants done

## 2016-01-17 NOTE — Telephone Encounter (Signed)
Pt needs repeat lab orders, Pt states she was told that she need to repeat in a few weeks. Please advise./tvw

## 2016-01-17 NOTE — Telephone Encounter (Signed)
Pt stated she needs lab orders for

## 2016-01-17 NOTE — Telephone Encounter (Signed)
Pt called needing lab work done. Need orders please and thank you. Call pt @ [phone].

## 2016-01-17 NOTE — Patient Outreach (Signed)
[facility]) Care Management  [place]   01/17/2016  Vanessa Higgins 1951/09/01 [id number]  Subjective:  Ms. Motamedi is a 65 year old female here today for an initial diabetes Link To Wellness visit with the clinical pharmacist. She is currently not able to exercise as she is experiencing pain in the right knee after a full day of work. She is scheduled for an evaluation of her knee in two weeks. She is up to date on her eye and dental exams. Her main concern today was that her A1c had increased from 6.6% in June to 7.0% in December of 2016.  I discussed the basics of carbohydrate counting and provided her a book to review for further details.  Objective:  A1c = 7% (12/06/15)  BP = 124/68 mmHg.  Weight = 213 lbs.  TC: 163 mg/dl. TG: 128 mg/dl. HDL: 41.5 mg/dl. LDL: 96 mg/dl. (12/06/15)  Current Medications: Current Outpatient Prescriptions  Medication Sig Dispense Refill  . ADVAIR DISKUS 250-50 MCG/DOSE AEPB INHALE 1 PUFF INTO THE LUNGS 2 TIMES DAILY. 60 each 5  . aspirin 81 MG tablet Take 81 mg by mouth daily.    Marland Kitchen atorvastatin (LIPITOR) 10 MG tablet Take 10 mg by mouth every evening.     Marland Kitchen b complex vitamins tablet Take 1 tablet by mouth daily.    . canagliflozin (INVOKANA) 300 MG TABS tablet Take 300 mg by mouth daily before breakfast.    . Dulaglutide (TRULICITY) 1.5 0000000 SOPN Inject into the skin.    Marland Kitchen glucose blood (BAYER CONTOUR NEXT TEST) test strip Use 2 (two) times daily.    Marland Kitchen loratadine (CLARITIN) 10 MG tablet Take 10 mg by mouth daily.    . metFORMIN (GLUCOPHAGE) 1000 MG tablet Take 1 tablet (1,000 mg total) by mouth 2 (two) times daily with a meal. (Patient taking differently: Take 1,000 mg by mouth 2 (two) times daily with a meal. Takes 2 500 mg tabs by mouth twice a day) 60 tablet 5  . montelukast (SINGULAIR) 10 MG tablet TAKE 1 TABLET (10 MG TOTAL) BY MOUTH AT BEDTIME. 90 tablet 3  . Multiple Vitamins-Minerals (CENTRUM SILVER PO) Take 1 tablet by  mouth daily.    . Omega-3 Fatty Acids (FISH OIL) 1200 MG CAPS Take 1 capsule by mouth daily.    Marland Kitchen omeprazole (PRILOSEC) 20 MG capsule TAKE 1 CAPSULE BY MOUTH DAILY. 90 capsule 2  . ondansetron (ZOFRAN) 4 MG tablet Take 1 tablet (4 mg total) by mouth daily as needed for nausea or vomiting. 20 tablet 1  . ramipril (ALTACE) 10 MG capsule TAKE 1 CAPSULE (10 MG TOTAL) BY MOUTH DAILY. 90 capsule 3  . venlafaxine XR (EFFEXOR-XR) 150 MG 24 hr capsule TAKE 1 CAPSULE BY MOUTH DAILY WITH BREAKFAST. 90 capsule 3  . Verapamil HCl CR 300 MG CP24 TAKE 1 CAPSULE BY MOUTH DAILY. 90 capsule 3  . phenazopyridine (PYRIDIUM) 200 MG tablet Take 1 tablet (200 mg total) by mouth 3 (three) times daily as needed for pain. (Patient not taking: Reported on 01/17/2016) 10 tablet 0   No current facility-administered medications for this visit.    Functional Status: In your present state of health, do you have any difficulty performing the following activities: 01/17/2016  Hearing? N  Vision? N  Difficulty concentrating or making decisions? N  Walking or climbing stairs? Y  Dressing or bathing? N  Doing errands, shopping? N    Fall/Depression Screening: PHQ 2/9 Scores 01/17/2016 12/02/2014  PHQ - 2 Score 0 0    THN CM Care Plan Problem One        Most Recent Value   Care Plan Problem One  Maintain A1c at goal    Role Documenting the Problem One  Clinical Pharmacist   Care Plan for Problem One  Active   THN Long Term Goal (31-90 days)  Patient will maintain A1c below 7% over the next 90 days,  evidenced by physician or patient report   [place] Term Goal Start Date  01/17/16   Interventions for Problem One Long Term Goal  Continue to attend physician and pharmacy appointments. Continue current medication regimen and adherence. Continue to watch serving/portion sizes.      Assessment: 1. Diabetes: at goal of less than 7%. Patient was started on Trulicity back in July of 2016. Not able to tolerate Victoza due to GI  side effects. Currently pays $25 for Invokana and 0000000 for Trulicity at the [facility]; no     concerns at this time. Ms. Stefanowicz will review the information provided on carbohydrate counting. 2. Hypertension: at goal of less than 140/90 mmHg. 3. Cholesterol: total cholesterol at goal of <200 mg/dl. Triglycerides at goal of <150 mg/dl. LDL at goal of <100 mg/dl. HDL not at goal of >50 mg/dl. 4. Depression screening completed. 5. Quality of Life screening completed.  Plan: 1. Ms. Vago will see her endocrinologist, Dr. Eddie Dibbles, in March 2017. 2. 3 month follow up Link To Wellness visit scheduled for 04/17/16 with pharmacist.   Cleopatra Cedar. Dicky Doe, PharmD [facility] [phone]

## 2016-01-17 NOTE — Telephone Encounter (Signed)
Called and notified pt of Dr.Walker's message and pt verbalized understanding./tvw

## 2016-01-19 ENCOUNTER — Encounter: Payer: Self-pay | Admitting: Pharmacist

## 2016-01-30 DIAGNOSIS — M1711 Unilateral primary osteoarthritis, right knee: Secondary | ICD-10-CM | POA: Diagnosis not present

## 2016-01-30 DIAGNOSIS — M25561 Pain in right knee: Secondary | ICD-10-CM | POA: Diagnosis not present

## 2016-02-13 DIAGNOSIS — M1711 Unilateral primary osteoarthritis, right knee: Secondary | ICD-10-CM | POA: Diagnosis not present

## 2016-02-20 DIAGNOSIS — M1711 Unilateral primary osteoarthritis, right knee: Secondary | ICD-10-CM | POA: Diagnosis not present

## 2016-02-28 DIAGNOSIS — Z0000 Encounter for general adult medical examination without abnormal findings: Secondary | ICD-10-CM | POA: Diagnosis not present

## 2016-02-28 DIAGNOSIS — N3020 Other chronic cystitis without hematuria: Secondary | ICD-10-CM | POA: Diagnosis not present

## 2016-02-29 DIAGNOSIS — M1711 Unilateral primary osteoarthritis, right knee: Secondary | ICD-10-CM | POA: Diagnosis not present

## 2016-03-07 ENCOUNTER — Other Ambulatory Visit: Payer: Self-pay | Admitting: Internal Medicine

## 2016-03-07 DIAGNOSIS — I10 Essential (primary) hypertension: Secondary | ICD-10-CM | POA: Diagnosis not present

## 2016-03-07 DIAGNOSIS — E785 Hyperlipidemia, unspecified: Secondary | ICD-10-CM | POA: Diagnosis not present

## 2016-03-07 DIAGNOSIS — E119 Type 2 diabetes mellitus without complications: Secondary | ICD-10-CM | POA: Diagnosis not present

## 2016-03-21 ENCOUNTER — Telehealth: Payer: Self-pay | Admitting: Internal Medicine

## 2016-03-21 DIAGNOSIS — E119 Type 2 diabetes mellitus without complications: Secondary | ICD-10-CM

## 2016-03-21 NOTE — Telephone Encounter (Signed)
Pt is scheduled °

## 2016-03-21 NOTE — Telephone Encounter (Signed)
Last Labs were 12/72016, please advise and order. thanks

## 2016-03-21 NOTE — Telephone Encounter (Signed)
Orders placed.

## 2016-03-21 NOTE — Telephone Encounter (Signed)
Pt called to have A1c lab work and all other lab work needed. Need orders please and thank yiu. Call pt @ [phone] or work [phone].

## 2016-03-21 NOTE — Telephone Encounter (Signed)
Please advise patient, and schedule. thanks

## 2016-03-27 ENCOUNTER — Other Ambulatory Visit: Payer: Self-pay

## 2016-03-28 ENCOUNTER — Other Ambulatory Visit: Payer: Self-pay | Admitting: Internal Medicine

## 2016-04-03 ENCOUNTER — Other Ambulatory Visit (INDEPENDENT_AMBULATORY_CARE_PROVIDER_SITE_OTHER): Payer: 59

## 2016-04-03 DIAGNOSIS — E119 Type 2 diabetes mellitus without complications: Secondary | ICD-10-CM

## 2016-04-03 LAB — COMPREHENSIVE METABOLIC PANEL
ALT: 38 U/L — ABNORMAL HIGH (ref 0–35)
AST: 36 U/L (ref 0–37)
Albumin: 4.2 g/dL (ref 3.5–5.2)
Alkaline Phosphatase: 42 U/L (ref 39–117)
BUN: 16 mg/dL (ref 6–23)
CO2: 30 mEq/L (ref 19–32)
Calcium: 9.6 mg/dL (ref 8.4–10.5)
Chloride: 99 mEq/L (ref 96–112)
Creatinine, Ser: 0.7 mg/dL (ref 0.40–1.20)
GFR: 88.8 mL/min (ref >60.00)
Glucose, Bld: 142 mg/dL — ABNORMAL HIGH (ref 70–99)
Potassium: 4.4 mEq/L (ref 3.5–5.1)
Sodium: 137 mEq/L (ref 135–145)
Total Bilirubin: 1.3 mg/dL — ABNORMAL HIGH (ref 0.2–1.2)
Total Protein: 7.5 g/dL (ref 6.0–8.3)

## 2016-04-03 LAB — HEMOGLOBIN A1C: Hgb A1c MFr Bld: 7.4 % — ABNORMAL HIGH (ref 4.6–6.5)

## 2016-04-08 ENCOUNTER — Other Ambulatory Visit: Payer: Self-pay | Admitting: Internal Medicine

## 2016-04-08 ENCOUNTER — Telehealth: Payer: Self-pay | Admitting: Internal Medicine

## 2016-04-08 DIAGNOSIS — Z1231 Encounter for screening mammogram for malignant neoplasm of breast: Secondary | ICD-10-CM

## 2016-04-08 NOTE — Telephone Encounter (Signed)
Pt called to get lab results from 04/05. Call pt @ [phone] this number until 4pm. Thank you!

## 2016-04-08 NOTE — Telephone Encounter (Signed)
Ok

## 2016-04-08 NOTE — Telephone Encounter (Signed)
Spoke with patient and reviewed lab results.  Verbalized understanding and confirmed follow up appt.

## 2016-04-17 ENCOUNTER — Other Ambulatory Visit: Payer: Self-pay | Admitting: Internal Medicine

## 2016-04-17 ENCOUNTER — Ambulatory Visit
Admission: RE | Admit: 2016-04-17 | Discharge: 2016-04-17 | Disposition: A | Discharge status: Home / Self Care | Payer: 59 | Source: Ambulatory Visit | Attending: Internal Medicine | Admitting: Internal Medicine

## 2016-04-17 ENCOUNTER — Other Ambulatory Visit: Payer: Self-pay | Admitting: Pharmacist

## 2016-04-17 DIAGNOSIS — Z1231 Encounter for screening mammogram for malignant neoplasm of breast: Secondary | ICD-10-CM | POA: Diagnosis not present

## 2016-04-18 NOTE — Patient Outreach (Signed)
[facility]) Care Management  [place]   04/18/2016  Vanessa Higgins [birth year] [id number]  Subjective:  Vanessa Higgins is a 65 year old female here today for a follow up Link To Wellness visit with the pharmacist. She complains of pain in the right knee which is prohibiting her from exercising. She works 30 hours per week, which contributes to her knee pain, as she is mostly on her feet. She has a dilated eye exam scheduled for next week and is current with her dental exam. Her A1c has increased to 7.4% over the last 3 months. Vanessa Higgins admits to experiencing more stress than usual over the last several months. However, she feels confident and motivated to lower her A1c. She saw Dr. Eddie Dibbles in March and has a follow up diabetes appointment in June. She saw Dr. Gilford Rile (primary care) 12/06/15.  Objective:  Filed Vitals:   04/17/16 1057  Height: 1.651 m (5\' 5" )  Weight: 215 lb (97.523 kg)   Filed Vitals:   04/17/16 1057  BP: 108/65  Pulse: 65   Lab Values A1c = 7.4% (04/03/16) Cholesterol: TC = 163 mg/dl, TG = 128 mg/dl, LDL = 96 mg/dl, HDL = 41.5mg /dl (12/06/15)  Encounter Medications: Outpatient Encounter Prescriptions as of 04/17/2016  Medication Sig Note  . ADVAIR DISKUS 250-50 MCG/DOSE AEPB INHALE 1 PUFF INTO THE LUNGS 2 TIMES DAILY.   Marland Kitchen aspirin 81 MG tablet Take 81 mg by mouth daily.   Marland Kitchen atorvastatin (LIPITOR) 10 MG tablet Take 10 mg by mouth every evening.    Marland Kitchen b complex vitamins tablet Take 1 tablet by mouth daily.   . canagliflozin (INVOKANA) 300 MG TABS tablet Take 300 mg by mouth daily before breakfast.   . Dulaglutide (TRULICITY) 1.5 0000000 SOPN Inject into the skin. 12/06/2015: Received from: [facility]  . glucose blood (BAYER CONTOUR NEXT TEST) test strip Use 2 (two) times daily. 12/06/2015: Received from: [facility]  . loratadine (CLARITIN) 10 MG tablet Take 10 mg by mouth daily.   . metFORMIN (GLUCOPHAGE) 500 MG  tablet Take 1,000 mg by mouth 2 (two) times daily with a meal.   . montelukast (SINGULAIR) 10 MG tablet TAKE 1 TABLET (10 MG TOTAL) BY MOUTH AT BEDTIME.   Marland Kitchen Omega-3 Fatty Acids (FISH OIL) 1200 MG CAPS Take 1 capsule by mouth daily.   Marland Kitchen omeprazole (PRILOSEC) 20 MG capsule TAKE 1 CAPSULE BY MOUTH DAILY.   . ramipril (ALTACE) 10 MG capsule TAKE 1 CAPSULE (10 MG TOTAL) BY MOUTH DAILY.   Marland Kitchen venlafaxine XR (EFFEXOR-XR) 150 MG 24 hr capsule TAKE 1 CAPSULE BY MOUTH DAILY WITH BREAKFAST.   Marland Kitchen Verapamil HCl CR 300 MG CP24 TAKE 1 CAPSULE BY MOUTH DAILY.   . metFORMIN (GLUCOPHAGE) 1000 MG tablet Take 1 tablet (1,000 mg total) by mouth 2 (two) times daily with a meal. (Patient not taking: Reported on 04/17/2016)   . Multiple Vitamins-Minerals (CENTRUM SILVER PO) Take 1 tablet by mouth daily. Reported on 04/17/2016   . ondansetron (ZOFRAN) 4 MG tablet Take 1 tablet (4 mg total) by mouth daily as needed for nausea or vomiting. (Patient not taking: Reported on 04/17/2016)   . phenazopyridine (PYRIDIUM) 200 MG tablet Take 1 tablet (200 mg total) by mouth 3 (three) times daily as needed for pain. (Patient not taking: Reported on 01/17/2016)    No facility-administered encounter medications on file as of 04/17/2016.    Functional Status: In your present state of  health, do you have any difficulty performing the following activities: 04/17/2016 01/17/2016  Hearing? N N  Vision? N N  Difficulty concentrating or making decisions? N N  Walking or climbing stairs? Y Y  Dressing or bathing? N N  Doing errands, shopping? N N    Fall/Depression Screening: PHQ 2/9 Scores 04/17/2016 01/17/2016 12/02/2014  PHQ - 2 Score 0 0 0    THN CM Care Plan Problem One        Most Recent Value   Care Plan Problem One  Maintain A1c at goal of less than 7%   Role Documenting the Problem One  Clinical Pharmacist   Care Plan for Problem One  Active   THN Long Term Goal (31-90 days)  Patient will maintain A1c below 7% over the next 90  days,  evidenced by physician or patient report [Patient did not meet this goal on return visit 04-17-16]   THN Long Term Goal Start Date  01/17/16   Interventions for Problem One Long Term Goal  Continue to attend physician and pharmacy appointments. Continue current medication regimen and adherence. Continue to watch serving/portion sizes.      Assessment: Diabetes: not at goal of less than 7%. Patient is more aware of her diet and portion sizes. Hypertension: at goal of less than 140/90 mmHg Cholesterol: HDL not at goal of greater than 50 mg/dl. All other values at goal. Exercise: patient is not able to exercise due to her increasing knee pain. Discussed the potential for trying water aerobics or a stationary bike.    Plan: Diabetes: Vanessa Higgins will see Dr. Eddie Dibbles in June for a follow up.  She will consider using the stationary bike or trying water aerobics. Vanessa Higgins will follow up in 3 months with the Link To Wellness pharmacist. Will reassess blood glucose values and A1c if available.     Seylah Wernert K. Dicky Doe, PharmD [facility] [phone]

## 2016-04-24 DIAGNOSIS — E119 Type 2 diabetes mellitus without complications: Secondary | ICD-10-CM | POA: Diagnosis not present

## 2016-05-20 ENCOUNTER — Other Ambulatory Visit: Payer: Self-pay | Admitting: Internal Medicine

## 2016-06-05 ENCOUNTER — Ambulatory Visit: Payer: Self-pay

## 2016-06-05 ENCOUNTER — Ambulatory Visit (INDEPENDENT_AMBULATORY_CARE_PROVIDER_SITE_OTHER): Payer: 59 | Admitting: Internal Medicine

## 2016-06-05 ENCOUNTER — Encounter: Payer: Self-pay | Admitting: Internal Medicine

## 2016-06-05 VITALS — BP 116/48 | HR 78 | Ht 65.0 in | Wt 218.0 lb

## 2016-06-05 DIAGNOSIS — J45909 Unspecified asthma, uncomplicated: Secondary | ICD-10-CM

## 2016-06-05 DIAGNOSIS — I499 Cardiac arrhythmia, unspecified: Secondary | ICD-10-CM | POA: Diagnosis not present

## 2016-06-05 DIAGNOSIS — I10 Essential (primary) hypertension: Secondary | ICD-10-CM | POA: Diagnosis not present

## 2016-06-05 DIAGNOSIS — R05 Cough: Secondary | ICD-10-CM

## 2016-06-05 DIAGNOSIS — E669 Obesity, unspecified: Secondary | ICD-10-CM

## 2016-06-05 DIAGNOSIS — E119 Type 2 diabetes mellitus without complications: Secondary | ICD-10-CM

## 2016-06-05 DIAGNOSIS — R059 Cough, unspecified: Secondary | ICD-10-CM

## 2016-06-05 NOTE — Addendum Note (Signed)
Addended by: Bevelyn Ngo on: 06/05/2016 09:52 AM   Modules accepted: Orders

## 2016-06-05 NOTE — Patient Instructions (Signed)
Chest xray today to evaluate cough.  Follow up in 4 weeks and sooner as needed.

## 2016-06-05 NOTE — Progress Notes (Signed)
Subjective:    Patient ID: Vanessa Higgins, female    DOB: June 19, 1951, 65 y.o.   MRN: [id number]  HPI  65YO female presents for follow up.  Cough - Started about 2 months ago. No fever, chills. No dyspnea. Sometimes wakes her up at night.  Generally non-productive. Non-smoker.   DM - Continues on Invokana and Metformin. BG mostly near 140-160.   Lab Results  Component Value Date   HGBA1C 7.4* 04/03/2016   Asthma - Takes Advair daily and Albuterol only rarely. Denies dyspnea, wheezing. Cough as noted above.  Notes occasional irregular heart beats. No chest pain. Occurs at rest.  Wt Readings from Last 3 Encounters:  06/05/16 218 lb (98.884 kg)  04/17/16 215 lb (97.523 kg)  01/17/16 213 lb (96.616 kg)   BP Readings from Last 3 Encounters:  06/05/16 116/48  04/17/16 108/65  01/17/16 124/68    Past Medical History  Diagnosis Date  . Asthma   . Arthritis   . Diabetes mellitus   . Headache(784.0)   . GERD (gastroesophageal reflux disease)   . Allergy     hay fever  . Hypertension   . Hyperlipidemia   . Migraine   . Blood transfusion abn reaction or complication, no procedure mishap   . UTI (lower urinary tract infection)   . Wears glasses   . Ankle fracture, left 04/08/2014   Family History  Problem Relation Age of Onset  . Heart disease Mother   . Sudden death Mother   . Cancer Mother 58    pancreatic  . Heart disease Father 59  . Sudden death Father   . Cancer Brother     colon  . Stroke Maternal Grandmother   . Heart disease Maternal Grandfather   . Cancer Sister     brain  . Breast cancer Neg Hx    Past Surgical History  Procedure Laterality Date  . Appendectomy  04/22/1976  . Tonsillectomy and adenoidectomy      as a child  . Tubal ligation  05/1976  . 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 Parotidectomy  1980    left  . Colonoscopy    . Orif ankle fracture Left 04/08/2014    Procedure: OPEN REDUCTION INTERNAL FIXATION (ORIF) ANKLE FRACTURE LEFT  ANKLE FRACTURE OPEN TREATMENT BIMALLEOLAR ANKLE INCLUDES INTERNAL FIXATION;  Surgeon: Johnny Bridge, MD;  Location: [facility];  Service: Orthopedics;  Laterality: Left;   Social History   Social History  . Marital Status: Widowed    Spouse Name: N/A  . Number of Children: N/A  . Years of Education: N/A   Social History Main Topics  . Smoking status: Never Smoker   . Smokeless tobacco: None  . Alcohol Use: No  . Drug Use: No  . Sexual Activity: Not Asked   Other Topics Concern  . None   Social History Narrative   Lives in [place] with daughter and twin grandchildren 9YO. Husband deceased 22-Apr-2008.      Work - [employer]   Diet - healthy   Exercise - gym 2 x per week    Review of Systems  Constitutional: Negative for fever, chills, appetite change, fatigue and unexpected weight change.  Eyes: Negative for visual disturbance.  Respiratory: Positive for cough. Negative for chest tightness, shortness of breath and wheezing.   Cardiovascular: Negative for chest pain, palpitations and leg swelling.  Gastrointestinal: Negative for nausea, vomiting, abdominal pain, diarrhea and constipation.  Musculoskeletal: Negative for arthralgias.  Skin: Negative for color change and rash.  Hematological: Negative for adenopathy. Does not bruise/bleed easily.  Psychiatric/Behavioral: Negative for dysphoric mood. The patient is not nervous/anxious.        Objective:    BP 116/48 mmHg  Pulse 78  Ht 5\' 5"  (1.651 m)  Wt 218 lb (98.884 kg)  BMI 36.28 kg/m2  SpO2 95% Physical Exam  Constitutional: She is oriented to person, place, and time. She appears well-developed and well-nourished. No distress.  HENT:  Head: Normocephalic and atraumatic.  Right Ear: External ear normal.  Left Ear: External ear normal.  Nose: Nose normal.  Mouth/Throat: Oropharynx is clear and moist. No oropharyngeal exudate.  Eyes: Conjunctivae are normal. Pupils are equal, round, and reactive to  light. Right eye exhibits no discharge. Left eye exhibits no discharge. No scleral icterus.  Neck: Normal range of motion. Neck supple. No tracheal deviation present. No thyromegaly present.  Cardiovascular: Normal rate, regular rhythm, normal heart sounds and intact distal pulses.   Extrasystoles are present. Exam reveals no gallop and no friction rub.   No murmur heard. Pulmonary/Chest: Effort normal and breath sounds normal. No respiratory distress. She has no wheezes. She has no rales. She exhibits no tenderness.  Musculoskeletal: Normal range of motion. She exhibits no edema or tenderness.  Lymphadenopathy:    She has no cervical adenopathy.  Neurological: She is alert and oriented to person, place, and time. No cranial nerve deficit. She exhibits normal muscle tone. Coordination normal.  Skin: Skin is warm and dry. No rash noted. She is not diaphoretic. No erythema. No pallor.  Psychiatric: She has a normal mood and affect. Her behavior is normal. Judgment and thought content normal.          Assessment & Plan:   Problem List Items Addressed This Visit      Unprioritized   Asthma    Symptoms have generally been well controlled however last 2 months having more cough. Will get CXR today. Continue Advair and Albuterol prn.      Relevant Medications   Albuterol Sulfate 108 (90 Base) MCG/ACT AEPB   Cough    Two months of cough. Exam is normal today. Discussed potential causes including PND related to allergies. Will get CXR evaluation. Continue Claritin, Flonase. Follow up in 4 weeks and sooner if symptoms not improving.      Relevant Orders   DG Chest 2 View   Diabetes mellitus type 2, controlled ([place]) - Primary    BG well controlled. Followed by Dr. Eddie Dibbles. Will continue current medications.      Hypertension    BP Readings from Last 3 Encounters:  06/05/16 116/48  04/17/16 108/65  01/17/16 124/68   BP well controlled. Renal function recently normal. Continue current  medications.      Irregular heart rate    Intermittent irregular heart rate and extrasystoles on exam. Will get EKG tomorrow (EKG system not working today). Follow up after EKG.      Relevant Orders   EKG 12-Lead   Obesity    Wt Readings from Last 3 Encounters:  06/05/16 218 lb (98.884 kg)  04/17/16 215 lb (97.523 kg)  01/17/16 213 lb (96.616 kg)   Encouraged healthy diet and exercise.          Return in about 4 weeks (around 07/03/2016) for Recheck.  Ronette Deter, MD Internal Medicine [facility]

## 2016-06-05 NOTE — Addendum Note (Signed)
Addended by: Frutoso Chase A on: 06/05/2016 11:45 AM   Modules accepted: Orders

## 2016-06-05 NOTE — Assessment & Plan Note (Signed)
BG well controlled. Followed by Dr. Eddie Dibbles. Will continue current medications.

## 2016-06-05 NOTE — Assessment & Plan Note (Signed)
Intermittent irregular heart rate and extrasystoles on exam. Will get EKG tomorrow (EKG system not working today). Follow up after EKG.

## 2016-06-05 NOTE — Assessment & Plan Note (Signed)
BP Readings from Last 3 Encounters:  06/05/16 116/48  04/17/16 108/65  01/17/16 124/68   BP well controlled. Renal function recently normal. Continue current medications.

## 2016-06-05 NOTE — Assessment & Plan Note (Signed)
Symptoms have generally been well controlled however last 2 months having more cough. Will get CXR today. Continue Advair and Albuterol prn.

## 2016-06-05 NOTE — Assessment & Plan Note (Signed)
Wt Readings from Last 3 Encounters:  06/05/16 218 lb (98.884 kg)  04/17/16 215 lb (97.523 kg)  01/17/16 213 lb (96.616 kg)   Encouraged healthy diet and exercise.

## 2016-06-05 NOTE — Progress Notes (Signed)
Pre visit review using our clinic review tool, if applicable. No additional management support is needed unless otherwise documented below in the visit note. 

## 2016-06-05 NOTE — Assessment & Plan Note (Signed)
Two months of cough. Exam is normal today. Discussed potential causes including PND related to allergies. Will get CXR evaluation. Continue Claritin, Flonase. Follow up in 4 weeks and sooner if symptoms not improving.

## 2016-06-13 ENCOUNTER — Ambulatory Visit: Payer: 59

## 2016-06-13 ENCOUNTER — Ambulatory Visit (INDEPENDENT_AMBULATORY_CARE_PROVIDER_SITE_OTHER): Payer: 59

## 2016-06-13 DIAGNOSIS — I499 Cardiac arrhythmia, unspecified: Secondary | ICD-10-CM

## 2016-06-13 DIAGNOSIS — R05 Cough: Secondary | ICD-10-CM

## 2016-06-13 NOTE — Progress Notes (Signed)
Pt came in today for ekg due to technical difficulties at her visit last week.  Dr. Gilford Rile approved EKG for nurse visit.

## 2016-06-17 ENCOUNTER — Other Ambulatory Visit: Payer: Self-pay | Admitting: Internal Medicine

## 2016-06-18 ENCOUNTER — Telehealth: Payer: Self-pay

## 2016-06-18 NOTE — Telephone Encounter (Signed)
Left message to return call for results

## 2016-06-19 NOTE — Telephone Encounter (Signed)
Pt called returning your call.   Call pt @ [phone]. Thank you!

## 2016-06-19 NOTE — Telephone Encounter (Signed)
Patient was informed of results.  Patient understood and had no questions, comments, or concerns at this time.

## 2016-06-20 ENCOUNTER — Other Ambulatory Visit: Payer: Self-pay | Admitting: Family Medicine

## 2016-06-20 DIAGNOSIS — I493 Ventricular premature depolarization: Secondary | ICD-10-CM

## 2016-07-17 ENCOUNTER — Ambulatory Visit: Payer: Self-pay | Admitting: Pharmacist

## 2016-07-17 ENCOUNTER — Other Ambulatory Visit: Payer: Self-pay | Admitting: Internal Medicine

## 2016-07-24 ENCOUNTER — Ambulatory Visit: Payer: 59 | Admitting: Internal Medicine

## 2016-07-25 ENCOUNTER — Encounter: Payer: Self-pay | Admitting: Internal Medicine

## 2016-07-25 ENCOUNTER — Ambulatory Visit (INDEPENDENT_AMBULATORY_CARE_PROVIDER_SITE_OTHER): Payer: 59 | Admitting: Internal Medicine

## 2016-07-25 VITALS — BP 116/64 | HR 78 | Temp 98.0°F | Resp 16 | Wt 219.0 lb

## 2016-07-25 DIAGNOSIS — I10 Essential (primary) hypertension: Secondary | ICD-10-CM | POA: Diagnosis not present

## 2016-07-25 DIAGNOSIS — R059 Cough, unspecified: Secondary | ICD-10-CM

## 2016-07-25 DIAGNOSIS — R05 Cough: Secondary | ICD-10-CM | POA: Diagnosis not present

## 2016-07-25 MED ORDER — LOSARTAN POTASSIUM 25 MG PO TABS
25.0000 mg | ORAL_TABLET | Freq: Every day | ORAL | 1 refills | Status: DC
Start: 2016-07-25 — End: 2017-01-17

## 2016-07-25 NOTE — Progress Notes (Signed)
Subjective:    Patient ID: Vanessa Higgins, female    DOB: [birth year], 65 y.o.   MRN: [id number]  HPI  65YO female presents for follow up.  Recently seen for cough.CXR showed left lower lobe scaring which was stable from 04-14-15.   Really no improvement in cough. Notes that cough is worse when drinking cold fluids. Cough is dry and sporadic. Has not followed up with Pulmonary No dyspnea. No chest pain. No fever,chills.   Wt Readings from Last 3 Encounters:  07/25/16 219 lb (99.3 kg)  06/05/16 218 lb (98.9 kg)  04/17/16 215 lb (97.5 kg)   BP Readings from Last 3 Encounters:  07/25/16 116/64  06/05/16 (!) 116/48  04/17/16 108/65    Past Medical History:  Diagnosis Date  . Allergy    hay fever  . Ankle fracture, left 04/08/2014  . Arthritis   . Asthma   . Blood transfusion abn reaction or complication, no procedure mishap   . Diabetes mellitus   . GERD (gastroesophageal reflux disease)   . Headache(784.0)   . Hyperlipidemia   . Hypertension   . Migraine   . UTI (lower urinary tract infection)   . Wears glasses    Family History  Problem Relation Age of Onset  . Heart disease Mother   . Sudden death Mother   . Cancer Mother 53    pancreatic  . Heart disease Father 20  . Sudden death Father   . Cancer Brother     colon  . Stroke Maternal Grandmother   . Heart disease Maternal Grandfather   . Cancer Sister     brain  . Breast cancer Neg Hx    Past Surgical History:  Procedure Laterality Date  . APPENDECTOMY  1977  . COLONOSCOPY    . ORIF ANKLE FRACTURE Left 04/08/2014   Procedure: OPEN REDUCTION INTERNAL FIXATION (ORIF) ANKLE FRACTURE LEFT ANKLE FRACTURE OPEN TREATMENT BIMALLEOLAR ANKLE INCLUDES INTERNAL FIXATION;  Surgeon: Johnny Bridge, MD;  Location: [facility];  Service: Orthopedics;  Laterality: Left;  . PAROTIDECTOMY  1980   left  . TONSILLECTOMY AND ADENOIDECTOMY     as a child  . 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 TUBAL  LIGATION  05/1976   Social History   Social History  . Marital status: Widowed    Spouse name: N/A  . Number of children: N/A  . Years of education: N/A   Social History Main Topics  . Smoking status: Never Smoker  . Smokeless tobacco: Never Used  . Alcohol use No  . Drug use: No  . Sexual activity: Not Asked   Other Topics Concern  . None   Social History Narrative   Lives in [place] with daughter and twin grandchildren 9YO. Husband deceased 13-Apr-2008.      Work - [employer]   Diet - healthy   Exercise - gym 2 x per week    Review of Systems  Constitutional: Negative for appetite change, chills, fatigue, fever and unexpected weight change.  HENT: Negative for congestion, postnasal drip, rhinorrhea, sinus pressure, sneezing, sore throat, trouble swallowing and voice change.   Eyes: Negative for visual disturbance.  Respiratory: Positive for cough. Negative for apnea, chest tightness, shortness of breath and wheezing.   Cardiovascular: Negative for chest pain and leg swelling.  Gastrointestinal: Negative for abdominal pain.  Skin: Negative for color change and rash.  Hematological: Negative for adenopathy. Does not bruise/bleed easily.  Psychiatric/Behavioral: Negative  for dysphoric mood. The patient is not nervous/anxious.        Objective:    BP 116/64 (BP Location: Left Arm, Patient Position: Sitting, Cuff Size: Large)   Pulse 78   Temp 98 F (36.7 C) (Oral)   Resp 16   Wt 219 lb (99.3 kg)   SpO2 93%   BMI 36.44 kg/m  Physical Exam  Constitutional: She is oriented to person, place, and time. She appears well-developed and well-nourished. No distress.  HENT:  Head: Normocephalic and atraumatic.  Right Ear: External ear normal.  Left Ear: External ear normal.  Nose: Nose normal.  Mouth/Throat: Oropharynx is clear and moist. No oropharyngeal exudate.  Eyes: Conjunctivae are normal. Pupils are equal, round, and reactive to light. Right eye exhibits no  discharge. Left eye exhibits no discharge. No scleral icterus.  Neck: Normal range of motion. Neck supple. No tracheal deviation present. No thyromegaly present.  Cardiovascular: Normal rate, regular rhythm, normal heart sounds and intact distal pulses.  Exam reveals no gallop and no friction rub.   No murmur heard. Pulmonary/Chest: Effort normal and breath sounds normal. No accessory muscle usage. No respiratory distress. She has no decreased breath sounds. She has no wheezes. She has no rhonchi. She has no rales. She exhibits no tenderness.  Musculoskeletal: Normal range of motion. She exhibits no edema or tenderness.  Lymphadenopathy:    She has no cervical adenopathy.  Neurological: She is alert and oriented to person, place, and time. No cranial nerve deficit. She exhibits normal muscle tone. Coordination normal.  Skin: Skin is warm and dry. No rash noted. She is not diaphoretic. No erythema. No pallor.  Psychiatric: She has a normal mood and affect. Her behavior is normal. Judgment and thought content normal.          Assessment & Plan:   Problem List Items Addressed This Visit      Unprioritized   Cough - Primary    No change in cough symptoms. CXR from 05/2016 showed no acute process. Will try change in Ramipril to Losartan to see if this may be cause of cough. Will set up follow up with pulmonary. Follow up here in 2-4 weeks.      Relevant Orders   Ambulatory referral to Pulmonology   Basic Metabolic Panel (BMET)   Hypertension    BP Readings from Last 3 Encounters:  07/25/16 116/64  06/05/16 (!) 116/48  04/17/16 108/65   BP well controlled. Will however change Ramipril to Losartan, to see if this may be cause of her cough. Follow up in 2 weeks.      Relevant Medications   losartan (COZAAR) 25 MG tablet    Other Visit Diagnoses   None.      Return in about 4 weeks (around 08/22/2016) for New Patient.  Ronette Deter, MD [facility]

## 2016-07-25 NOTE — Patient Instructions (Signed)
Stop Ramipril.  Start Losartan 25mg  daily.  Return to have potassium checked in 2 weeks.  We will set up evaluation with Dr. Raul Del.

## 2016-07-25 NOTE — Assessment & Plan Note (Signed)
No change in cough symptoms. CXR from 05/2016 showed no acute process. Will try change in Ramipril to Losartan to see if this may be cause of cough. Will set up follow up with pulmonary. Follow up here in 2-4 weeks.

## 2016-07-25 NOTE — Assessment & Plan Note (Signed)
BP Readings from Last 3 Encounters:  07/25/16 116/64  06/05/16 (!) 116/48  04/17/16 108/65   BP well controlled. Will however change Ramipril to Losartan, to see if this may be cause of her cough. Follow up in 2 weeks.

## 2016-08-06 ENCOUNTER — Ambulatory Visit: Payer: 59 | Admitting: Cardiology

## 2016-08-08 ENCOUNTER — Other Ambulatory Visit (INDEPENDENT_AMBULATORY_CARE_PROVIDER_SITE_OTHER): Payer: 59

## 2016-08-08 DIAGNOSIS — R059 Cough, unspecified: Secondary | ICD-10-CM

## 2016-08-08 DIAGNOSIS — R05 Cough: Secondary | ICD-10-CM

## 2016-08-09 LAB — BASIC METABOLIC PANEL
BUN: 16 mg/dL (ref 6–23)
CO2: 29 mEq/L (ref 19–32)
Calcium: 9.6 mg/dL (ref 8.4–10.5)
Chloride: 98 mEq/L (ref 96–112)
Creatinine, Ser: 0.78 mg/dL (ref 0.40–1.20)
GFR: 78.29 mL/min (ref >60.00)
Glucose, Bld: 120 mg/dL — ABNORMAL HIGH (ref 70–99)
Potassium: 4.7 mEq/L (ref 3.5–5.1)
Sodium: 135 mEq/L (ref 135–145)

## 2016-08-12 ENCOUNTER — Telehealth: Payer: Self-pay | Admitting: Family

## 2016-08-12 DIAGNOSIS — E875 Hyperkalemia: Secondary | ICD-10-CM

## 2016-08-12 NOTE — Telephone Encounter (Signed)
Left message for patient to return call back.  

## 2016-08-12 NOTE — Telephone Encounter (Signed)
Please call patient-  Since started on losartan, potassium appears to be increasing however it is still WNL.   Can we have her come in another time for BMP to ensure that it is not getting too high on this medication?   Lab appointment for Wednesday or Thursday is preferable.   BMP pended.

## 2016-08-14 ENCOUNTER — Ambulatory Visit: Payer: Self-pay | Admitting: Pharmacist

## 2016-08-14 NOTE — Telephone Encounter (Signed)
Patient was informed of results.  Patient understood and no questions, comments, or concerns at this time. Patient has scheduled lab appointment [phone]

## 2016-08-15 ENCOUNTER — Other Ambulatory Visit (INDEPENDENT_AMBULATORY_CARE_PROVIDER_SITE_OTHER): Payer: 59

## 2016-08-15 DIAGNOSIS — E875 Hyperkalemia: Secondary | ICD-10-CM | POA: Diagnosis not present

## 2016-08-15 LAB — BASIC METABOLIC PANEL
BUN: 15 mg/dL (ref 6–23)
CO2: 27 mEq/L (ref 19–32)
Calcium: 10 mg/dL (ref 8.4–10.5)
Chloride: 101 mEq/L (ref 96–112)
Creatinine, Ser: 0.98 mg/dL (ref 0.40–1.20)
GFR: 60.16 mL/min (ref >60.00)
Glucose, Bld: 172 mg/dL — ABNORMAL HIGH (ref 70–99)
Potassium: 4.7 mEq/L (ref 3.5–5.1)
Sodium: 138 mEq/L (ref 135–145)

## 2016-08-16 ENCOUNTER — Other Ambulatory Visit: Payer: Self-pay

## 2016-08-16 MED ORDER — VENLAFAXINE HCL ER 150 MG PO CP24: ORAL_CAPSULE | ORAL | 0 refills | Status: DC

## 2016-08-22 ENCOUNTER — Ambulatory Visit: Payer: 59 | Admitting: Family

## 2016-08-24 NOTE — Progress Notes (Signed)
Cardiology Office Note   Date:  08/28/2016   ID:  Vanessa Higgins, Vanessa Higgins May 09, 1951, MRN [id number]  Referring Doctor:  Mable Paris, FNP   Cardiologist:   Wende Bushy, MD   Reason for consultation:  Chief Complaint  Patient presents with  . Other    Irregular heart rate. Meds reviewed verbally with pt.      History of Present Illness: COSMA POLLINO is a 65 y.o. female who presents for Evaluation of irregular heartbeats. Back in June 2017, after a routine visit with PCP, pulse was noted to be irregular. Subsequent EKG (personally reviewed by me) showed sinus rhythm with frequent PVCs. This prompted cardiology evaluation.  Per patient, she has symptoms of irregular heartbeats or palpitations for probably over a year now. They're not very frequent and not bothersome at all. Symptoms mainly in the chest, nonradiating, mild in intensity, randomly occurring, not particularly related to exertion. No loss of consciousness. Next  Patient also reports shortness of breath with walking especially on an incline. At baseline, her mobility is limited due to hip and knee pains. She has not noted any progression of the shortness of breath. It is mild in intensity, occurs with exertion and immediately resolved with rest. She had also has a diagnosis of asthma. She has not really needed her albuterol when this happens.  Patient denies PND, orthopnea, edema, claudication. No abdominal pain, nausea, vomiting diaphoresis.   ROS:  Please see the history of present illness. Aside from mentioned under HPI, all other systems are reviewed and negative.     Past Medical History:  Diagnosis Date  . Allergy    hay fever  . Ankle fracture, left 04/08/2014  . Arthritis   . Asthma   . Blood transfusion abn reaction or complication, no procedure mishap   . Diabetes mellitus   . GERD (gastroesophageal reflux disease)   . Headache(784.0)   . Hyperlipidemia   . Hypertension   . Migraine   . UTI (lower  urinary tract infection)   . Wears glasses     Past Surgical History:  Procedure Laterality Date  . APPENDECTOMY  1977  . COLONOSCOPY    . ORIF ANKLE FRACTURE Left 04/08/2014   Procedure: OPEN REDUCTION INTERNAL FIXATION (ORIF) ANKLE FRACTURE LEFT ANKLE FRACTURE OPEN TREATMENT BIMALLEOLAR ANKLE INCLUDES INTERNAL FIXATION;  Surgeon: Johnny Bridge, MD;  Location: [facility];  Service: Orthopedics;  Laterality: Left;  . PAROTIDECTOMY  1980   left  . TONSILLECTOMY AND ADENOIDECTOMY     as a child  . 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 TUBAL LIGATION  05/1976     reports that she has never smoked. She has never used smokeless tobacco. She reports that she does not drink alcohol or use drugs.   family history includes Cancer in her brother and sister; Cancer (age of onset: 19) in her mother; Heart disease in her maternal grandfather; Heart disease (age of onset: 46) in her father; Stroke in her maternal grandmother; Sudden death in her father and mother. Father died suddenly at age 79  Outpatient Medications Prior to Visit  Medication Sig Dispense Refill  . ADVAIR DISKUS 250-50 MCG/DOSE AEPB INHALE 1 PUFF INTO THE LUNGS 2 TIMES DAILY. 60 each 6  . Albuterol Sulfate 108 (90 Base) MCG/ACT AEPB     . aspirin 81 MG tablet Take 81 mg by mouth daily.    Marland Kitchen atorvastatin (LIPITOR) 10 MG tablet Take 10 mg  by mouth every evening.     Marland Kitchen b complex vitamins tablet Take 1 tablet by mouth daily.    . canagliflozin (INVOKANA) 300 MG TABS tablet Take 300 mg by mouth daily before breakfast.    . glucose blood (BAYER CONTOUR NEXT TEST) test strip Use 2 (two) times daily.    Marland Kitchen loratadine (CLARITIN) 10 MG tablet Take 10 mg by mouth daily.    Marland Kitchen losartan (COZAAR) 25 MG tablet Take 1 tablet (25 mg total) by mouth daily. 90 tablet 1  . metFORMIN (GLUCOPHAGE) 1000 MG tablet Take 1 tablet (1,000 mg total) by mouth 2 (two) times daily with a meal. 60 tablet 5  . montelukast (SINGULAIR) 10 MG  tablet TAKE 1 TABLET BY MOUTH AT BEDTIME. 90 tablet 3  . Multiple Vitamins-Minerals (CENTRUM SILVER PO) Take 1 tablet by mouth daily. Reported on 04/17/2016    . Omega-3 Fatty Acids (FISH OIL) 1200 MG CAPS Take 1 capsule by mouth daily.    Marland Kitchen omeprazole (PRILOSEC) 20 MG capsule TAKE 1 CAPSULE BY MOUTH DAILY. 90 capsule 2  . venlafaxine XR (EFFEXOR-XR) 150 MG 24 hr capsule TAKE 1 CAPSULE BY MOUTH DAILY WITH BREAKFAST. Keep appointment on 8/24 for more rf 30 capsule 0  . Verapamil HCl CR 300 MG CP24 TAKE 1 CAPSULE BY MOUTH ONCE DAILY 90 capsule 3   No facility-administered medications prior to visit.      Allergies: Sulfa antibiotics    PHYSICAL EXAM: VS:  BP 112/64 (BP Location: Right Arm, Patient Position: Sitting, Cuff Size: Large)   Pulse 83   Ht 5\' 5"  (1.651 m)   Wt 215 lb (97.5 kg)   BMI 35.78 kg/m  , Body mass index is 35.78 kg/m. Wt Readings from Last 3 Encounters:  08/28/16 215 lb (97.5 kg)  07/25/16 219 lb (99.3 kg)  06/05/16 218 lb (98.9 kg)    GENERAL:  well developed, well nourished, obese, not in acute distress HEENT: normocephalic, pink conjunctivae, anicteric sclerae, no xanthelasma, normal dentition, oropharynx clear NECK:  no neck vein engorgement, JVP normal, no hepatojugular reflux, carotid upstroke brisk and symmetric, no bruit, no thyromegaly, no lymphadenopathy LUNGS:  good respiratory effort, clear to auscultation bilaterally CV:  PMI not displaced, no thrills, no lifts, S1 and S2 within normal limits, no palpable S3 or S4, no murmurs, no rubs, no gallops ABD:  Soft, nontender, nondistended, normoactive bowel sounds, no abdominal aortic bruit, no hepatomegaly, no splenomegaly MS: nontender back, no kyphosis, no scoliosis, no joint deformities EXT:  2+ DP/PT pulses, no edema, no varicosities, no cyanosis, no clubbing SKIN: warm, nondiaphoretic, normal turgor, no ulcers NEUROPSYCH: alert, oriented to person, place, and time, sensory/motor grossly intact, normal  mood, appropriate affect  Recent Labs: 12/06/2015: Hemoglobin 13.1; Platelets 329.0; TSH 0.65 04/03/2016: ALT 38 08/15/2016: BUN 15; Creatinine, Ser 0.98; Potassium 4.7; Sodium 138   Lipid Panel    Component Value Date/Time   CHOL 163 12/06/2015 0918   CHOL 102 06/28/2013 0711   TRIG 128.0 12/06/2015 0918   TRIG 81 06/28/2013 0711   HDL 41.50 12/06/2015 0918   HDL 44 06/28/2013 0711   CHOLHDL 4 12/06/2015 0918   VLDL 25.6 12/06/2015 0918   VLDL 16 06/28/2013 0711   LDLCALC 96 12/06/2015 0918   LDLCALC 42 06/28/2013 0711     Other studies Reviewed:  EKG:  The ekg from 08/28/2016 was personally reviewed by me and it revealed sinus rhythm, 83 BPM. Poor R-wave progression. Low voltages.  Additional studies/ records  that were reviewed personally reviewed by me today include: None available   ASSESSMENT AND PLAN: PVCs Palpitations Recommend to start off with a 24-hour Holter monitor to determine ectopy burdened. Recommend echocardiogram as well.  Shortness of breath Risk factors for CAD include diabetes, hypertension, hyperlipidemia, age, postmenopausal state Recommend to rule out ischemia with a pharmacologic nuclear stress test. Patient unable to walk on the treadmill for a long time.  Hypertension BP is well controlled. Continue monitoring BP. Continue current medical therapy and lifestyle changes.  Hyperlipidemia Patient on statin, being followed by PCP. Ideally, LDL goal is less than 70 due to diabetes.   Current medicines are reviewed at length with the patient today.  The patient does not have concerns regarding medicines.  Labs/ tests ordered today include:  Orders Placed This Encounter  Procedures  . NM Myocar Multi W/Spect W/Wall Motion / EF  . Holter monitor - 24 hour  . EKG 12-Lead  . ECHOCARDIOGRAM COMPLETE    I had a lengthy and detailed discussion with the patient regarding diagnoses, prognosis, diagnostic options, treatment options, and side effects  of medications.   I counseled the patient on importance of lifestyle modification including heart healthy diet, regular physical activity once cardiac workup is done.   Disposition:   FU with undersigned after tests    Signed, Wende Bushy, MD  08/28/2016 9:30 AM    [facility]  This note was generated in part with voice recognition software and I apologize for any typographical errors that were not detected and corrected.

## 2016-08-27 DIAGNOSIS — R05 Cough: Secondary | ICD-10-CM | POA: Diagnosis not present

## 2016-08-27 DIAGNOSIS — J4520 Mild intermittent asthma, uncomplicated: Secondary | ICD-10-CM | POA: Diagnosis not present

## 2016-08-28 ENCOUNTER — Encounter: Payer: Self-pay | Admitting: Cardiology

## 2016-08-28 ENCOUNTER — Ambulatory Visit (INDEPENDENT_AMBULATORY_CARE_PROVIDER_SITE_OTHER): Payer: 59 | Admitting: Cardiology

## 2016-08-28 ENCOUNTER — Other Ambulatory Visit: Payer: Self-pay | Admitting: Pharmacist

## 2016-08-28 VITALS — BP 112/64 | HR 83 | Ht 65.0 in | Wt 215.0 lb

## 2016-08-28 DIAGNOSIS — R0602 Shortness of breath: Secondary | ICD-10-CM

## 2016-08-28 DIAGNOSIS — R002 Palpitations: Secondary | ICD-10-CM | POA: Diagnosis not present

## 2016-08-28 DIAGNOSIS — I499 Cardiac arrhythmia, unspecified: Secondary | ICD-10-CM

## 2016-08-28 DIAGNOSIS — I10 Essential (primary) hypertension: Secondary | ICD-10-CM

## 2016-08-28 DIAGNOSIS — I493 Ventricular premature depolarization: Secondary | ICD-10-CM

## 2016-08-28 DIAGNOSIS — E785 Hyperlipidemia, unspecified: Secondary | ICD-10-CM

## 2016-08-28 NOTE — Patient Instructions (Addendum)
Testing/Procedures: Your physician has recommended that you wear a holter monitor. Holter monitors are medical devices that record the heart's electrical activity. Doctors most often use these monitors to diagnose arrhythmias. Arrhythmias are problems with the speed or rhythm of the heartbeat. The monitor is a small, portable device. You can wear one while you do your normal daily activities. This is usually used to diagnose what is causing palpitations/syncope (passing out).  Your physician has requested that you have an echocardiogram. Echocardiography is a painless test that uses sound waves to create images of your heart. It provides your doctor with information about the size and shape of your heart and how well your heart's chambers and valves are working. This procedure takes approximately one hour. There are no restrictions for this procedure.  [place]  Your caregiver has ordered a Stress Test with nuclear imaging. The purpose of this test is to evaluate the blood supply to your heart muscle. This procedure is referred to as a "Non-Invasive Stress Test." This is because other than having an IV started in your vein, nothing is inserted or "invades" your body. Cardiac stress tests are done to find areas of poor blood flow to the heart by determining the extent of coronary artery disease (CAD). Some patients exercise on a treadmill, which naturally increases the blood flow to your heart, while others who are  unable to walk on a treadmill due to physical limitations have a pharmacologic/chemical stress agent called Lexiscan . This medicine will mimic walking on a treadmill by temporarily increasing your coronary blood flow.   Please note: these test may take anywhere between 2-4 hours to complete  PLEASE REPORT TO [place] AT THE FIRST DESK WILL DIRECT YOU WHERE TO GO  Date of Procedure:__Friday September 06, 2016 at 79:30AM_  Arrival Time for  Procedure:__Arrive at 07:15AM to register______  Instructions regarding medication:   __X__ : Hold diabetes medication morning of procedure    PLEASE NOTIFY THE OFFICE AT LEAST 24 HOURS IN ADVANCE IF YOU ARE UNABLE TO KEEP YOUR APPOINTMENT.  [phone] AND  PLEASE NOTIFY NUCLEAR MEDICINE AT [facility] AT LEAST 24 HOURS IN ADVANCE IF YOU ARE UNABLE TO KEEP YOUR APPOINTMENT. [phone]  How to prepare for your Myoview test:  1. Do not eat or drink after midnight 2. No caffeine for 24 hours prior to test 3. No smoking 24 hours prior to test. 4. Your medication may be taken with water.  If your doctor stopped a medication because of this test, do not take that medication. 5. Ladies, please do not wear dresses.  Skirts or pants are appropriate. Please wear a short sleeve shirt. 6. No perfume, cologne or lotion. 7. Wear comfortable walking shoes. No heels!    Follow-Up: Your physician recommends that you schedule a follow-up appointment after testing with Dr. Yvone Neu.  It was a pleasure seeing you today here in the office. Please do not hesitate to give [name] a call back if you have any further questions. [place], BSN     Holter Monitoring A Holter monitor is a small device that is used to detect abnormal heart rhythms. It clips to your clothing and is connected by wires to flat, sticky disks (electrodes) that attach to your chest. It is worn continuously for 24-48 hours. HOME CARE INSTRUCTIONS  Wear your Holter monitor at all times, even while exercising and sleeping, for as long as directed by your health care provider.  Make sure that the Holter monitor is safely clipped to your clothing or close to your body as recommended by your health care provider.  Do not get the monitor or wires wet.  Do not put body lotion or moisturizer on your chest.  Keep your skin clean.  Keep a diary of your daily activities, such as walking and doing chores. If you feel that your  heartbeat is abnormal or that your heart is fluttering or skipping a beat:  Record what you are doing when it happens.  Record what time of day the symptoms occur.  Return your Holter monitor as directed by your health care provider.  Keep all follow-up visits as directed by your health care provider. This is important. SEEK IMMEDIATE MEDICAL CARE IF:  You feel lightheaded or you faint.  You have trouble breathing.  You feel pain in your chest, upper arm, or jaw.  You feel sick to your stomach and your skin is pale, cool, or damp.  You heartbeat feels unusual or abnormal.   This information is not intended to replace advice given to you by your health care provider. Make sure you discuss any questions you have with your health care provider.   Document Released: 09/13/2004 Document Revised: 01/06/2015 Document Reviewed: 07/25/2014 Elsevier Interactive Patient Education 2016 [employer].   Echocardiogram An echocardiogram, or echocardiography, uses sound waves (ultrasound) to produce an image of your heart. The echocardiogram is simple, painless, obtained within a short period of time, and offers valuable information to your health care provider. The images from an echocardiogram can provide information such as:  Evidence of coronary artery disease (CAD).  Heart size.  Heart muscle function.  Heart valve function.  Aneurysm detection.  Evidence of a past heart attack.  Fluid buildup around the heart.  Heart muscle thickening.  Assess heart valve function. LET [facility] CARE PROVIDER KNOW ABOUT:  Any allergies you have.  All medicines you are taking, including vitamins, herbs, eye drops, creams, and over-the-counter medicines.  Previous problems you or members of your family have had with the use of anesthetics.  Any blood disorders you have.  Previous surgeries you have had.  Medical conditions you have.  Possibility of pregnancy, if this  applies. BEFORE THE PROCEDURE  No special preparation is needed. Eat and drink normally.  PROCEDURE   In order to produce an image of your heart, gel will be applied to your chest and a wand-like tool (transducer) will be moved over your chest. The gel will help transmit the sound waves from the transducer. The sound waves will harmlessly bounce off your heart to allow the heart images to be captured in real-time motion. These images will then be recorded.  You may need an IV to receive a medicine that improves the quality of the pictures. AFTER THE PROCEDURE You may return to your normal schedule including diet, activities, and medicines, unless your health care provider tells you otherwise.   This information is not intended to replace advice given to you by your health care provider. Make sure you discuss any questions you have with your health care provider.   Document Released: 12/13/2000 Document Revised: 01/06/2015 Document Reviewed: 08/23/2013 Elsevier Interactive Patient Education 2016 [place].    Pharmacologic Stress Electrocardiogram A pharmacologic stress electrocardiogram is a heart (cardiac) test that uses nuclear imaging to evaluate the blood supply to your heart. This test may also be called a pharmacologic stress electrocardiography. Pharmacologic means that a medicine is used  to increase your heart rate and blood pressure.  This stress test is done to find areas of poor blood flow to the heart by determining the extent of coronary artery disease (CAD). Some people exercise on a treadmill, which naturally increases the blood flow to the heart. For those people unable to exercise on a treadmill, a medicine is used. This medicine stimulates your heart and will cause your heart to beat harder and more quickly, as if you were exercising.  Pharmacologic stress tests can help determine:  The adequacy of blood flow to your heart during increased levels of activity in order to  clear you for discharge home.  The extent of coronary artery blockage caused by CAD.  Your prognosis if you have suffered a heart attack.  The effectiveness of cardiac procedures done, such as an angioplasty, which can increase the circulation in your coronary arteries.  Causes of chest pain or pressure. LET [facility] CARE PROVIDER KNOW ABOUT:  Any allergies you have.  All medicines you are taking, including vitamins, herbs, eye drops, creams, and over-the-counter medicines.  Previous problems you or members of your family have had with the use of anesthetics.  Any blood disorders you have.  Previous surgeries you have had.  Medical conditions you have.  Possibility of pregnancy, if this applies.  If you are currently breastfeeding. RISKS AND COMPLICATIONS Generally, this is a safe procedure. However, as with any procedure, complications can occur. Possible complications include:  You develop pain or pressure in the following areas:  Chest.  Jaw or neck.  Between your shoulder blades.  Radiating down your left arm.  Headache.  Dizziness or light-headedness.  Shortness of breath.  Increased or irregular heartbeat.  Low blood pressure.  Nausea or vomiting.  Flushing.  Redness going up the arm and slight pain during injection of medicine.  Heart attack (rare). BEFORE THE PROCEDURE   Avoid all forms of caffeine for 24 hours before your test or as directed by your health care provider. This includes coffee, tea (even decaffeinated tea), caffeinated sodas, chocolate, cocoa, and certain pain medicines.  Follow your health care provider's instructions regarding eating and drinking before the test.  Take your medicines as directed at regular times with water unless instructed otherwise. Exceptions may include:  If you have diabetes, ask how you are to take your insulin or pills. It is common to adjust insulin dosing the morning of the test.  If you are  taking beta-blocker medicines, it is important to talk to your health care provider about these medicines well before the date of your test. Taking beta-blocker medicines may interfere with the test. In some cases, these medicines need to be changed or stopped 24 hours or more before the test.  If you wear a nitroglycerin patch, it may need to be removed prior to the test. Ask your health care provider if the patch should be removed before the test.  If you use an inhaler for any breathing condition, bring it with you to the test.  If you are an outpatient, bring a snack so you can eat right after the stress phase of the test.  Do not smoke for 4 hours prior to the test or as directed by your health care provider.  Do not apply lotions, powders, creams, or oils on your chest prior to the test.  Wear comfortable shoes and clothing. Let your health care provider know if you were unable to complete or follow the preparations for your  test. PROCEDURE   Multiple patches (electrodes) will be put on your chest. If needed, small areas of your chest may be shaved to get better contact with the electrodes. Once the electrodes are attached to your body, multiple wires will be attached to the electrodes, and your heart rate will be monitored.  An IV access will be started. A nuclear trace (isotope) is given. The isotope may be given intravenously, or it may be swallowed. Nuclear refers to several types of radioactive isotopes, and the nuclear isotope lights up the arteries so that the nuclear images are clear. The isotope is absorbed by your body. This results in low radiation exposure.  A resting nuclear image is taken to show how your heart functions at rest.  A medicine is given through the IV access.  A second scan is done about 1 hour after the medicine injection and determines how your heart functions under stress.  During this stress phase, you will be connected to an electrocardiogram machine.  Your blood pressure and oxygen levels will be monitored. AFTER THE PROCEDURE   Your heart rate and blood pressure will be monitored after the test.  You may return to your normal schedule, including diet,activities, and medicines, unless your health care provider tells you otherwise.   This information is not intended to replace advice given to you by your health care provider. Make sure you discuss any questions you have with your health care provider.   Document Released: 05/04/2009 Document Revised: 12/21/2013 Document Reviewed: 08/23/2013 Elsevier Interactive Patient Education [employer].

## 2016-08-28 NOTE — Patient Outreach (Signed)
[facility]) Care Management  [place]   08/28/2016  Vanessa Higgins [birth year] [id number]  Subjective:  Vanessa Higgins is here today for her 3 month Link To Wellness visit. She continues to have pain in her right knee, although the pain has improved since receiving the Synvisc injections. She is currently working about 30 hours per week and is on her feet a lot during the day caring for her grandchildren. She has been able to exercise 2-3 times per week for up to 30 minutes per session. She is current with her dental and dilated eye exams. She is scheduled for a follow up visit with Dr. Eddie Dibbles 09/05/16. She was recently changed from ramipril to losartan due to cough.  Objective:  Vitals:   08/28/16 1414  BP: 118/66  Weight: 215 lb (97.5 kg)  Height: 1.651 m (5\' 5" )   A1c = 7.4% 04/03/16 Lipid Panel: TC = 163 mg/dl; TG = 128 mg/dl; LDL = 96 mg/dl; HDL = 41.5 mg/dl 12/06/15   Encounter Medications: Outpatient Encounter Prescriptions as of 08/28/2016  Medication Sig Note  . ADVAIR DISKUS 250-50 MCG/DOSE AEPB INHALE 1 PUFF INTO THE LUNGS 2 TIMES DAILY.   Marland Kitchen Albuterol Sulfate 108 (90 Base) MCG/ACT AEPB  06/05/2016: Received from: [facility]  . aspirin 81 MG tablet Take 81 mg by mouth daily.   Marland Kitchen atorvastatin (LIPITOR) 10 MG tablet Take 10 mg by mouth every evening.    Marland Kitchen b complex vitamins tablet Take 1 tablet by mouth daily.   . canagliflozin (INVOKANA) 300 MG TABS tablet Take 300 mg by mouth daily before breakfast.   . Dulaglutide (TRULICITY) 1.5 0000000 SOPN Inject 1.5 mg into the skin once a week.   Marland Kitchen glucose blood (BAYER CONTOUR NEXT TEST) test strip Use 2 (two) times daily. 12/06/2015: Received from: [facility]  . loratadine (CLARITIN) 10 MG tablet Take 10 mg by mouth daily.   Marland Kitchen losartan (COZAAR) 25 MG tablet Take 1 tablet (25 mg total) by mouth daily.   . metFORMIN (GLUCOPHAGE) 1000 MG tablet Take 1 tablet (1,000 mg total) by mouth 2  (two) times daily with a meal.   . montelukast (SINGULAIR) 10 MG tablet TAKE 1 TABLET BY MOUTH AT BEDTIME.   . Multiple Vitamins-Minerals (CENTRUM SILVER PO) Take 1 tablet by mouth daily. Reported on 04/17/2016   . Omega-3 Fatty Acids (FISH OIL) 1200 MG CAPS Take 1 capsule by mouth daily.   Marland Kitchen omeprazole (PRILOSEC) 20 MG capsule TAKE 1 CAPSULE BY MOUTH DAILY.   Marland Kitchen venlafaxine XR (EFFEXOR-XR) 150 MG 24 hr capsule TAKE 1 CAPSULE BY MOUTH DAILY WITH BREAKFAST. Keep appointment on 8/24 for more rf   . Verapamil HCl CR 300 MG CP24 TAKE 1 CAPSULE BY MOUTH ONCE DAILY    No facility-administered encounter medications on file as of 08/28/2016.     Functional Status: In your present state of health, do you have any difficulty performing the following activities: 08/28/2016 08/28/2016  Hearing? - N  Vision? - N  Difficulty concentrating or making decisions? - N  Walking or climbing stairs? Y Y  Dressing or bathing? - N  Doing errands, shopping? - N  Some recent data might be hidden    Fall/Depression Screening: PHQ 2/9 Scores 08/28/2016 06/05/2016 04/17/2016 01/17/2016 12/02/2014  PHQ - 2 Score 0 0 0 0 0     [facility] CM Care Plan Problem One   Flowsheet Row Most Recent Value  Care Plan  Problem One  Maintain A1c at goal of less than 7%  Role Documenting the Problem One  Clinical Pharmacist  Care Plan for Problem One  Active  [facility] Long Term Goal (31-90 days)  Patient will maintain A1c below 7% over the next 90 days,  evidenced by physician or patient report  [place] Term Goal Start Date  08/28/16  Interventions for Problem One Long Term Goal  Continue exercising as tolerated 3 days per week. Continue to attend physician and pharmacy appointments. Continue current medication regimen and adherence. Continue to watch serving/portion sizes.      Assessment: 1. Diabetes: not at goal of less than 7%. Patient is watching her diet and following the diabetic diet.  2. Hypertension: at goal of less than 140/90  mmHg. Recently switched to losartan from ramipril due to cough. 3. Cholesterol: HDL not at goal of greater than 50 mg/dl. All other values within goal. 4. Exercise: not able to attend water aerobics due to schedule. She is now able to exercise post receiving the Synvisc injections.   Plan: Vanessa Higgins is scheduled for a follow up visit with Dr. Eddie Dibbles 09/05/16 and with NP, Arnett 09/19/16.  A1c and lipid panel are due in September. Patient will follow up in 3 months with the pharmacist for her Link To Wellness visit.   Vanessa Higgins K. Dicky Doe, PharmD [place] [facility] [phone]

## 2016-09-05 ENCOUNTER — Telehealth: Payer: Self-pay | Admitting: Cardiology

## 2016-09-05 ENCOUNTER — Telehealth: Payer: Self-pay | Admitting: *Deleted

## 2016-09-05 NOTE — Telephone Encounter (Signed)
Reviewed lexiscan instructions w/pt who verbalized understanding.

## 2016-09-05 NOTE — Telephone Encounter (Signed)
Patient questioned if she should have a blood work drawn, due to her blood pressure issues. Pt contact [phone]

## 2016-09-05 NOTE — Telephone Encounter (Signed)
Please advise 

## 2016-09-06 ENCOUNTER — Ambulatory Visit
Admission: RE | Admit: 2016-09-06 | Discharge: 2016-09-06 | Disposition: A | Discharge status: Home / Self Care | Payer: 59 | Source: Ambulatory Visit | Attending: Cardiology | Admitting: Cardiology

## 2016-09-06 DIAGNOSIS — I493 Ventricular premature depolarization: Secondary | ICD-10-CM | POA: Insufficient documentation

## 2016-09-06 DIAGNOSIS — R002 Palpitations: Secondary | ICD-10-CM | POA: Diagnosis not present

## 2016-09-06 DIAGNOSIS — R0602 Shortness of breath: Secondary | ICD-10-CM | POA: Insufficient documentation

## 2016-09-06 LAB — NM MYOCAR MULTI W/SPECT W/WALL MOTION / EF
LV dias vol: 101 mL (ref 46–106)
LV sys vol: 49 mL
Peak HR: 86 {beats}/min
Percent HR: 56 %
Rest HR: 70 {beats}/min
SDS: 0
SRS: 5
SSS: 0
TID: 1

## 2016-09-06 MED ORDER — TECHNETIUM TC 99M TETROFOSMIN IV KIT
33.0000 | PACK | Freq: Once | INTRAVENOUS | Status: AC | PRN
  Administered 2016-09-06: 31.45 via INTRAVENOUS

## 2016-09-06 MED ORDER — REGADENOSON 0.4 MG/5ML IV SOLN
0.4000 mg | Freq: Once | INTRAVENOUS | Status: AC
  Administered 2016-09-06: 0.4 mg via INTRAVENOUS

## 2016-09-06 MED ORDER — TECHNETIUM TC 99M TETROFOSMIN IV KIT
13.0000 | PACK | Freq: Once | INTRAVENOUS | Status: AC | PRN
  Administered 2016-09-06: 12.77 via INTRAVENOUS

## 2016-09-06 NOTE — Telephone Encounter (Signed)
Vanessa Higgins,  Would you call patient -  Her BP was well controlled at her last visit with Dr Farrel Conners ( cardiologist).  I am confused what blood work would she need?  Are her BP's running high at home?  If yes, she needs to have an appointment - and let her cardiologist know as well.   Please emphasize, if she is having any SOB, chest pain with elevated BP's she needs to go to the ED.

## 2016-09-06 NOTE — Telephone Encounter (Signed)
Left message for patient to call back  

## 2016-09-10 NOTE — Telephone Encounter (Signed)
Left message for patient to return call back.  

## 2016-09-13 ENCOUNTER — Other Ambulatory Visit: Payer: Self-pay | Admitting: Family

## 2016-09-13 NOTE — Telephone Encounter (Signed)
Left message for patient to return call back. I will be sending out MYchart message to patient.

## 2016-09-19 ENCOUNTER — Encounter: Payer: Self-pay | Admitting: Family

## 2016-09-19 ENCOUNTER — Ambulatory Visit (INDEPENDENT_AMBULATORY_CARE_PROVIDER_SITE_OTHER): Payer: 59

## 2016-09-19 ENCOUNTER — Ambulatory Visit (INDEPENDENT_AMBULATORY_CARE_PROVIDER_SITE_OTHER): Payer: 59 | Admitting: Family

## 2016-09-19 ENCOUNTER — Other Ambulatory Visit: Payer: Self-pay

## 2016-09-19 VITALS — BP 118/68 | HR 78 | Temp 97.9°F | Ht 65.0 in | Wt 217.2 lb

## 2016-09-19 DIAGNOSIS — R002 Palpitations: Secondary | ICD-10-CM | POA: Diagnosis not present

## 2016-09-19 DIAGNOSIS — I10 Essential (primary) hypertension: Secondary | ICD-10-CM | POA: Diagnosis not present

## 2016-09-19 DIAGNOSIS — Z7189 Other specified counseling: Secondary | ICD-10-CM | POA: Diagnosis not present

## 2016-09-19 DIAGNOSIS — E119 Type 2 diabetes mellitus without complications: Secondary | ICD-10-CM

## 2016-09-19 DIAGNOSIS — R0602 Shortness of breath: Secondary | ICD-10-CM | POA: Diagnosis not present

## 2016-09-19 DIAGNOSIS — I493 Ventricular premature depolarization: Secondary | ICD-10-CM

## 2016-09-19 DIAGNOSIS — J45909 Unspecified asthma, uncomplicated: Secondary | ICD-10-CM

## 2016-09-19 DIAGNOSIS — Z7689 Persons encountering health services in other specified circumstances: Secondary | ICD-10-CM | POA: Insufficient documentation

## 2016-09-19 DIAGNOSIS — I499 Cardiac arrhythmia, unspecified: Secondary | ICD-10-CM

## 2016-09-19 LAB — LIPID PANEL
Cholesterol: 134 mg/dL (ref 0–200)
HDL: 44 mg/dL (ref >39.00)
LDL Cholesterol: 69 mg/dL (ref 0–99)
NonHDL: 89.5
Total CHOL/HDL Ratio: 3
Triglycerides: 102 mg/dL (ref 0.0–149.0)
VLDL: 20.4 mg/dL (ref 0.0–40.0)

## 2016-09-19 LAB — COMPREHENSIVE METABOLIC PANEL
ALT: 42 U/L — ABNORMAL HIGH (ref 0–35)
AST: 40 U/L — ABNORMAL HIGH (ref 0–37)
Albumin: 4.2 g/dL (ref 3.5–5.2)
Alkaline Phosphatase: 41 U/L (ref 39–117)
BUN: 14 mg/dL (ref 6–23)
CO2: 30 mEq/L (ref 19–32)
Calcium: 9.3 mg/dL (ref 8.4–10.5)
Chloride: 102 mEq/L (ref 96–112)
Creatinine, Ser: 0.65 mg/dL (ref 0.40–1.20)
GFR: 96.59 mL/min (ref >60.00)
Glucose, Bld: 126 mg/dL — ABNORMAL HIGH (ref 70–99)
Potassium: 4.2 mEq/L (ref 3.5–5.1)
Sodium: 139 mEq/L (ref 135–145)
Total Bilirubin: 1 mg/dL (ref 0.2–1.2)
Total Protein: 7.7 g/dL (ref 6.0–8.3)

## 2016-09-19 LAB — CBC WITH DIFFERENTIAL/PLATELET
Basophils Absolute: 0 10*3/uL (ref 0.0–0.1)
Basophils Relative: 0.7 % (ref 0.0–3.0)
Eosinophils Absolute: 0.3 10*3/uL (ref 0.0–0.7)
Eosinophils Relative: 5.6 % — ABNORMAL HIGH (ref 0.0–5.0)
HCT: 38.4 % (ref 36.0–46.0)
Hemoglobin: 12.5 g/dL (ref 12.0–15.0)
Lymphocytes Relative: 36.2 % (ref 12.0–46.0)
Lymphs Abs: 2.1 10*3/uL (ref 0.7–4.0)
MCHC: 32.6 g/dL (ref 30.0–36.0)
MCV: 85.1 fl (ref 78.0–100.0)
Monocytes Absolute: 0.4 10*3/uL (ref 0.1–1.0)
Monocytes Relative: 7.2 % (ref 3.0–12.0)
Neutro Abs: 2.9 10*3/uL (ref 1.4–7.7)
Neutrophils Relative %: 50.3 % (ref 43.0–77.0)
Platelets: 333 10*3/uL (ref 150.0–400.0)
RBC: 4.51 Mil/uL (ref 3.87–5.11)
RDW: 16.9 % — ABNORMAL HIGH (ref 11.5–15.5)
WBC: 5.8 10*3/uL (ref 4.0–10.5)

## 2016-09-19 LAB — MICROALBUMIN / CREATININE URINE RATIO
Creatinine,U: 68.1 mg/dL
Microalb Creat Ratio: 1 mg/g (ref 0.0–30.0)
Microalb, Ur: 0.7 mg/dL (ref 0.0–1.9)

## 2016-09-19 LAB — TSH: TSH: 0.98 u[IU]/mL (ref 0.35–4.50)

## 2016-09-19 LAB — HEMOGLOBIN A1C: Hgb A1c MFr Bld: 7.6 % — ABNORMAL HIGH (ref 4.6–6.5)

## 2016-09-19 LAB — VITAMIN D 25 HYDROXY (VIT D DEFICIENCY, FRACTURES): VITD: 47.82 ng/mL (ref 30.00–100.00)

## 2016-09-19 LAB — ECHOCARDIOGRAM COMPLETE
Height: 65 in
Weight: 3475.2 oz

## 2016-09-19 NOTE — Assessment & Plan Note (Signed)
Intermittent. Patient follows Dr. Farrel Conners cardiology. Normal stress test. She is pending a Holter monitor.

## 2016-09-19 NOTE — Progress Notes (Signed)
Subjective:    Patient ID: Vanessa Higgins, female    DOB: 10-21-1951, 65 y.o.   MRN: [id number]  CC: Vanessa Higgins is a 65 y.o. female who presents today for follow up.   HPI: She is here for follow-up and establish care.  Depression- Has been on Effexor for 10 years which good control. Not tearful. Sleeping well. No anxiety.  HTN- On Cozaar. Checking BP at hospital and staying around 120/70. Denies exertional chest pain or pressure, numbness or tingling radiating to left arm or jaw, palpitations, dizziness, frequent headaches, changes in vision, or shortness of breath.   Migraine- very rarely. Started on verapamil many years ago for prophylaxis.  DM- On metformin, invokana, and trulity. Follow[facility] with Dr[facility] Eddie Dib[facility]. However we managed here as well. Due for endocrine 10/2016. Last A1C 7.4%. Fasting BS in the morning average 150-170. Afternoon BS 120-130. Knowledgeable about DM diet, has seen educator in the past.   Asthma- Controlled. 'Turned loose' from Dr. Raul Del.  Irregular heartbeat- occasional feels palpitations. Starting Holter monitor this afternoon. Denies exertional chest pain or pressure, numbness or tingling radiating to left arm or jaw, dizziness, frequent headaches, changes in vision, or shortness of breath.    GYN retired.    Pap done in 2016, normal per patient. Unable to see results in Epic.   HISTORY:  Past Medical History:  Diagnosis Date  . Allergy    hay fever  . Ankle fracture, left 04/08/2014  . Arthritis   . Asthma   . Blood transfusion abn reaction or complication, no procedure mishap   . Diabetes mellitus   . GERD (gastroesophageal reflux disease)   . Headache(784.0)   . Hyperlipidemia   . Hypertension   . Migraine   . UTI (lower urinary tract infection)   . Wears glasses    Past Surgical History:  Procedure Laterality Date  . APPENDECTOMY  1977  . COLONOSCOPY    . ORIF ANKLE FRACTURE Left 04/08/2014   Procedure: OPEN REDUCTION INTERNAL  FIXATION (ORIF) ANKLE FRACTURE LEFT ANKLE FRACTURE OPEN TREATMENT BIMALLEOLAR ANKLE INCLUDES INTERNAL FIXATION;  Surgeon: Johnny Bridge, MD;  Location: [facility];  Service: Orthopedics;  Laterality: Left;  . PAROTIDECTOMY  1980   left  . TONSILLECTOMY AND ADENOIDECTOMY     as a child  . 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 TUBAL LIGATION  05/1976   Family History  Problem Relation Age of Onset  . Sudden death Mother   . Cancer Mother 64    pancreatic  . Heart disease Father 67  . Sudden death Father   . Cancer Brother     colon  . Stroke Maternal Grandmother   . Heart disease Maternal Grandfather   . Cancer Sister     brain  . Breast cancer Neg Hx     Allergies: Sulfa antibiotics and Ramipril Current Outpatient Prescriptions on File Prior to Visit  Medication Sig Dispense Refill  . ADVAIR DISKUS 250-50 MCG/DOSE AEPB INHALE 1 PUFF INTO THE LUNGS 2 TIMES DAILY. 60 each 6  . Albuterol Sulfate 108 (90 Base) MCG/ACT AEPB     . aspirin 81 MG tablet Take 81 mg by mouth daily.    Marland Kitchen atorvastatin (LIPITOR) 10 MG tablet Take 10 mg by mouth every evening.     Marland Kitchen b complex vitamins tablet Take 1 tablet by mouth daily.    . canagliflozin (INVOKANA) 300 MG TABS tablet Take 300 mg by  mouth daily before breakfast.    . Dulaglutide (TRULICITY) 1.5 0000000 SOPN Inject 1.5 mg into the skin once a week.    Marland Kitchen glucose blood (BAYER CONTOUR NEXT TEST) test strip Use 2 (two) times daily.    Marland Kitchen loratadine (CLARITIN) 10 MG tablet Take 10 mg by mouth daily.    Marland Kitchen losartan (COZAAR) 25 MG tablet Take 1 tablet (25 mg total) by mouth daily. 90 tablet 1  . metFORMIN (GLUCOPHAGE) 1000 MG tablet Take 1 tablet (1,000 mg total) by mouth 2 (two) times daily with a meal. 60 tablet 5  . montelukast (SINGULAIR) 10 MG tablet TAKE 1 TABLET BY MOUTH AT BEDTIME. 90 tablet 3  . Multiple Vitamins-Minerals (CENTRUM SILVER PO) Take 1 tablet by mouth daily. Reported on 04/17/2016    . Omega-3 Fatty Acids  (FISH OIL) 1200 MG CAPS Take 1 capsule by mouth daily.    Marland Kitchen omeprazole (PRILOSEC) 20 MG capsule TAKE 1 CAPSULE BY MOUTH DAILY. 90 capsule 2  . venlafaxine XR (EFFEXOR-XR) 150 MG 24 hr capsule TAKE 1 CAPSULE BY MOUTH DAILY WITH BREAKFAST. Keep appointment on 8/24 for more rf 30 capsule 0  . Verapamil HCl CR 300 MG CP24 TAKE 1 CAPSULE BY MOUTH ONCE DAILY 90 capsule 3   No current facility-administered medications on file prior to visit.     Social History  Substance Use Topics  . Smoking status: Never Smoker  . Smokeless tobacco: Never Used  . Alcohol use No    Review of Systems  Constitutional: Negative for chills and fever.  Respiratory: Negative for cough.   Cardiovascular: Positive for palpitations (occasional). Negative for chest pain.  Gastrointestinal: Negative for nausea and vomiting.      Objective:    BP 118/68   Pulse 78   Temp 97.9 F (36.6 C) (Oral)   Ht 5\' 5"  (1.651 m)   Wt 217 lb 3.2 oz (98.5 kg)   SpO2 94%   BMI 36.14 kg/m  BP Readings from Last 3 Encounters:  09/19/16 118/68  08/28/16 118/66  08/28/16 112/64   Wt Readings from Last 3 Encounters:  09/19/16 217 lb 3.2 oz (98.5 kg)  08/28/16 215 lb (97.5 kg)  08/28/16 215 lb (97.5 kg)    Physical Exam  Constitutional: She appears well-developed and well-nourished.  Eyes: Conjunctivae are normal.  Cardiovascular: Normal rate, regular rhythm, normal heart sounds and normal pulses.   Pulmonary/Chest: Effort normal and breath sounds normal. She has no wheezes. She has no rhonchi. She has no rales.  Neurological: She is alert.  Skin: Skin is warm and dry.  Psychiatric: She has a normal mood and affect. Her speech is normal and behavior is normal. Thought content normal.  Vitals reviewed.      Assessment & Plan:   Problem List Items Addressed This Visit      Cardiovascular and Mediastinum   Hypertension    Stable. Well-controlled on current regimen. Pending CMP.      Relevant Orders    Comprehensive metabolic panel     Respiratory   Asthma    Stable. Well-controlled on current regimen.        Endocrine   Diabetes mellitus type 2, controlled ([place])    Pending A1c. Patient follows with endocrinology as well.        Other   Irregular heart rate    Intermittent. Patient follows Dr. Farrel Conners cardiology. Normal stress test. She is pending a Holter monitor.      Encounter to establish care -  Primary    Review of past medical, social, family history. Order screening labs and DEXA today the patient will return for CPE a couple of months. Since patient's GYN has retired, patient will resume women's care with me going forward.      Relevant Orders   DG Bone Density   CBC with Differential/Platelet   Comprehensive metabolic panel   Hemoglobin A1c   Lipid panel   Microalbumin / creatinine urine ratio   TSH   VITAMIN D 25 Hydroxy (Vit-D Deficiency, Fractures)   Hepatitis C antibody    Other Visit Diagnoses    Palpitations           I am having Ms. Stowell maintain her Multiple Vitamins-Minerals (CENTRUM SILVER PO), aspirin, Fish Oil, b complex vitamins, loratadine, metFORMIN, atorvastatin, canagliflozin, glucose blood, omeprazole, ADVAIR DISKUS, Albuterol Sulfate, Verapamil HCl CR, montelukast, losartan, venlafaxine XR, and Dulaglutide.   No orders of the defined types were placed in this encounter.   Return precautions given.   Risks, benefits, and alternatives of the medications and treatment plan prescribed today were discussed, and patient expressed understanding.   Education regarding symptom management and diagnosis given to patient on AVS.  Continue to follow with Mable Paris, FNP for routine health maintenance.   [name] and I agreed with plan.   Mable Paris, FNP

## 2016-09-19 NOTE — Assessment & Plan Note (Signed)
Review of past medical, social, family history. Order screening labs and DEXA today the patient will return for CPE a couple of months. Since patient's GYN has retired, patient will resume women's care with me going forward.

## 2016-09-19 NOTE — Progress Notes (Signed)
Pre visit review using our clinic review tool, if applicable. No additional management support is needed unless otherwise documented below in the visit note. 

## 2016-09-19 NOTE — Assessment & Plan Note (Signed)
Stable. Well-controlled on current regimen. Pending CMP.

## 2016-09-19 NOTE — Assessment & Plan Note (Signed)
Pending A1c. Patient follows with endocrinology as well.

## 2016-09-19 NOTE — Assessment & Plan Note (Signed)
Stable. Well-controlled on current regimen. 

## 2016-09-20 ENCOUNTER — Other Ambulatory Visit: Payer: Self-pay | Admitting: Family

## 2016-09-20 DIAGNOSIS — R748 Abnormal levels of other serum enzymes: Secondary | ICD-10-CM

## 2016-09-20 LAB — HEPATITIS C ANTIBODY: HCV Ab: NEGATIVE

## 2016-09-24 ENCOUNTER — Other Ambulatory Visit: Payer: Self-pay | Admitting: *Deleted

## 2016-09-24 MED ORDER — VENLAFAXINE HCL ER 150 MG PO CP24: ORAL_CAPSULE | ORAL | 3 refills | Status: DC

## 2016-09-24 NOTE — Progress Notes (Signed)
Cardiology Office Note   Date:  10/02/2016   ID:  Vanessa Higgins, DOB 07-Aug-1951, MRN [id number]  Referring Doctor:  Mable Paris, FNP   Cardiologist:   Wende Bushy, MD   Reason for consultation:  Chief Complaint  Patient presents with  . Follow-up    no cp, sob or swelling. no other complaints.      History of Present Illness: Vanessa Higgins is a 65 y.o. female who presents for Follow up after testing   Per patient, she has symptoms of irregular heartbeats or palpitations for probably over a year now. They're not very frequent and not bothersome at all. Symptoms mainly in the chest, nonradiating, mild in intensity, randomly occurring, not particularly related to exertion. No loss of consciousness. Her palpitations have remained about the same as before. She is otherwise not bothered by the fluttering sensation in her chest.  When asked about snoring, she is aware and has been told by relatives that she makes a lot of noise when asleep and is likely snoring heavily. She also admits to daytime fatigue and possible sleepiness during the day.  Patient denies PND, orthopnea, edema, claudication. No abdominal pain, nausea, vomiting diaphoresis.   ROS:  Please see the history of present illness. Aside from mentioned under HPI, all other systems are reviewed and negative.     Past Medical History:  Diagnosis Date  . Allergy    hay fever  . Ankle fracture, left 04/08/2014  . Arthritis   . Asthma   . Blood transfusion abn reaction or complication, no procedure mishap   . Diabetes mellitus   . GERD (gastroesophageal reflux disease)   . Headache(784.0)   . Hyperlipidemia   . Hypertension   . Migraine   . UTI (lower urinary tract infection)   . Wears glasses     Past Surgical History:  Procedure Laterality Date  . APPENDECTOMY  1977  . COLONOSCOPY    . ORIF ANKLE FRACTURE Left 04/08/2014   Procedure: OPEN REDUCTION INTERNAL FIXATION (ORIF) ANKLE FRACTURE LEFT ANKLE  FRACTURE OPEN TREATMENT BIMALLEOLAR ANKLE INCLUDES INTERNAL FIXATION;  Surgeon: Johnny Bridge, MD;  Location: [facility];  Service: Orthopedics;  Laterality: Left;  . PAROTIDECTOMY  1980   left  . TONSILLECTOMY AND ADENOIDECTOMY     as a child  . 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 TUBAL LIGATION  05/1976     reports that she has never smoked. She has never used smokeless tobacco. She reports that she does not drink alcohol or use drugs.   family history includes Cancer in her brother and sister; Cancer (age of onset: 65) in her mother; Heart disease in her maternal grandfather; Heart disease (age of onset: 34) in her father; Stroke in her maternal grandmother; Sudden death in her father and mother. Father died suddenly at age 17  Outpatient Medications Prior to Visit  Medication Sig Dispense Refill  . ADVAIR DISKUS 250-50 MCG/DOSE AEPB INHALE 1 PUFF INTO THE LUNGS 2 TIMES DAILY. 60 each 6  . Albuterol Sulfate 108 (90 Base) MCG/ACT AEPB     . aspirin 81 MG tablet Take 81 mg by mouth daily.    Marland Kitchen atorvastatin (LIPITOR) 10 MG tablet Take 10 mg by mouth every evening.     Marland Kitchen b complex vitamins tablet Take 1 tablet by mouth daily.    . canagliflozin (INVOKANA) 300 MG TABS tablet Take 300 mg by mouth daily before breakfast.    .  Dulaglutide (TRULICITY) 1.5 0000000 SOPN Inject 1.5 mg into the skin once a week.    Marland Kitchen glucose blood (BAYER CONTOUR NEXT TEST) test strip Use 2 (two) times daily.    Marland Kitchen loratadine (CLARITIN) 10 MG tablet Take 10 mg by mouth daily.    Marland Kitchen losartan (COZAAR) 25 MG tablet Take 1 tablet (25 mg total) by mouth daily. 90 tablet 1  . metFORMIN (GLUCOPHAGE) 1000 MG tablet Take 1 tablet (1,000 mg total) by mouth 2 (two) times daily with a meal. 60 tablet 5  . montelukast (SINGULAIR) 10 MG tablet TAKE 1 TABLET BY MOUTH AT BEDTIME. 90 tablet 3  . Multiple Vitamins-Minerals (CENTRUM SILVER PO) Take 1 tablet by mouth daily. Reported on 04/17/2016    . Omega-3  Fatty Acids (FISH OIL) 1200 MG CAPS Take 1 capsule by mouth daily.    Marland Kitchen omeprazole (PRILOSEC) 20 MG capsule TAKE 1 CAPSULE BY MOUTH DAILY. 90 capsule 2  . venlafaxine XR (EFFEXOR-XR) 150 MG 24 hr capsule TAKE 1 CAPSULE BY MOUTH DAILY WITH BREAKFAST. 90 capsule 3  . Verapamil HCl CR 300 MG CP24 TAKE 1 CAPSULE BY MOUTH ONCE DAILY 90 capsule 3   No facility-administered medications prior to visit.      Allergies: Sulfa antibiotics and Ramipril    PHYSICAL EXAM: VS:  BP 124/80 (BP Location: Left Arm, Patient Position: Sitting, Cuff Size: Normal)   Pulse 78   Ht 5\' 5"  (1.651 m)   Wt 218 lb 1.9 oz (98.9 kg)   BMI 36.30 kg/m  , Body mass index is 36.3 kg/m. Wt Readings from Last 3 Encounters:  10/02/16 218 lb 1.9 oz (98.9 kg)  09/19/16 217 lb 3.2 oz (98.5 kg)  08/28/16 215 lb (97.5 kg)    GENERAL:  well developed, well nourished, obese, not in acute distress HEENT: normocephalic, pink conjunctivae, anicteric sclerae, no xanthelasma, normal dentition, oropharynx clear NECK:  no neck vein engorgement, JVP normal, no hepatojugular reflux, carotid upstroke brisk and symmetric, no bruit, no thyromegaly, no lymphadenopathy LUNGS:  good respiratory effort, clear to auscultation bilaterally CV:  PMI not displaced, no thrills, no lifts, S1 and S2 within normal limits, no palpable S3 or S4, no murmurs, no rubs, no gallops ABD:  Soft, nontender, nondistended, normoactive bowel sounds, no abdominal aortic bruit, no hepatomegaly, no splenomegaly MS: nontender back, no kyphosis, no scoliosis, no joint deformities EXT:  2+ DP/PT pulses, no edema, no varicosities, no cyanosis, no clubbing SKIN: warm, nondiaphoretic, normal turgor, no ulcers NEUROPSYCH: alert, oriented to person, place, and time, sensory/motor grossly intact, normal mood, appropriate affect  Recent Labs: 09/19/2016: ALT 42; BUN 14; Creatinine, Ser 0.65; Hemoglobin 12.5; Platelets 333.0; Potassium 4.2; Sodium 139; TSH 0.98   Lipid  Panel    Component Value Date/Time   CHOL 134 09/19/2016 1023   CHOL 102 06/28/2013 0711   TRIG 102.0 09/19/2016 1023   TRIG 81 06/28/2013 0711   HDL 44.00 09/19/2016 1023   HDL 44 06/28/2013 0711   CHOLHDL 3 09/19/2016 1023   VLDL 20.4 09/19/2016 1023   VLDL 16 06/28/2013 0711   LDLCALC 69 09/19/2016 1023   LDLCALC 42 06/28/2013 0711     Other studies Reviewed:  EKG:  The ekg from 08/28/2016 was personally reviewed by me and it revealed sinus rhythm, 83 BPM. Poor R-wave progression. Low voltages.  Additional studies/ records that were reviewed personally reviewed by me today include:  Echo 09/19/2016: Left ventricle: The cavity size was normal. Systolic function was  normal. The estimated ejection fraction was in the range of 60%   to 65%. Wall motion was normal; there were no regional wall   motion abnormalities. Left ventricular diastolic function   parameters were normal. - Aortic root: The aortic root was mildly dilated 3.4 cm - Left atrium: The atrium was normal in size. - Right ventricle: Systolic function was normal. - Pulmonary arteries: Systolic pressure was within the normal   range.  Nuclear stress 09/06/2016: Pharmacological myocardial perfusion imaging study with no significant  ischemia Normal wall motion, EF estimated at 55% No EKG changes concerning for ischemia at peak stress or in recovery. Low risk scan  Holter 09/19/2016: 24 hour holter monitor  Overall rhythm - sinus, 65-111, ave 82 bpm  Supraventricular ectopy: 10 PACs, 1 couplet, one 5beat atrial run at 132bpm  Ventricular ectopy 11% of total beats: 12264 PVCs ,5 ventricular couplets, 1010 in bigeminy  ASSESSMENT AND PLAN: PVCsAnd PACs noted on Holter No ischemia on stress as. LVEF is within normal limits. Since patient is not truly bothered by the palpitations, she elects to continue same medications as before and monitor for symptoms. She was advised to Dr. PCP and also to her  pulmonary doctor about possibly proceeding with a sleep study to evaluate for sleep apnea.   Shortness of breath Risk factors for CAD include diabetes, hypertension, hyperlipidemia, age, postmenopausal state No evidence of ischemia in study. Likely her clinically significant CAD is low based on this stress test. Findings discussed with patient. Patient reassured.  Hypertension BP is well controlled. Continue monitoring BP. Continue current medical therapy and lifestyle changes.  Hyperlipidemia Patient on statin, being followed by PCP. Ideally, LDL goal is less than 70 due to diabetes.   Current medicines are reviewed at length with the patient today.  The patient does not have concerns regarding medicines.  Labs/ tests ordered today include:  No orders of the defined types were placed in this encounter.   I had a lengthy and detailed discussion with the patient regarding diagnoses, prognosis, diagnostic options, treatment options, and side effects of medications.   I counseled the patient on importance of lifestyle modification including heart healthy diet, regular physical activity    Disposition:   FU with undersigned in 6 months  Signed, Wende Bushy, MD  10/02/2016 2:17 PM    [place]  This note was generated in part with voice recognition software and I apologize for any typographical errors that were not detected and corrected.

## 2016-09-24 NOTE — Telephone Encounter (Signed)
Refilled 08/16/16. Pt last seen 09/19/16. Please advise?

## 2016-09-24 NOTE — Telephone Encounter (Signed)
Patient has requested a medication refill for venlafaxine XR  Pharmacy [facility] Pt requested lab results  She also requested to know why an ultrasound was ordered, this is a order that she can see in her MyChart  Pt contact [phone]

## 2016-09-27 ENCOUNTER — Ambulatory Visit: Payer: 59

## 2016-10-01 ENCOUNTER — Ambulatory Visit: Payer: 59 | Admitting: Cardiology

## 2016-10-02 ENCOUNTER — Other Ambulatory Visit (INDEPENDENT_AMBULATORY_CARE_PROVIDER_SITE_OTHER): Payer: 59

## 2016-10-02 ENCOUNTER — Encounter: Payer: Self-pay | Admitting: Cardiology

## 2016-10-02 ENCOUNTER — Ambulatory Visit (INDEPENDENT_AMBULATORY_CARE_PROVIDER_SITE_OTHER): Payer: 59 | Admitting: Cardiology

## 2016-10-02 VITALS — BP 124/80 | HR 78 | Ht 65.0 in | Wt 218.1 lb

## 2016-10-02 DIAGNOSIS — I10 Essential (primary) hypertension: Secondary | ICD-10-CM

## 2016-10-02 DIAGNOSIS — I493 Ventricular premature depolarization: Secondary | ICD-10-CM | POA: Diagnosis not present

## 2016-10-02 DIAGNOSIS — R748 Abnormal levels of other serum enzymes: Secondary | ICD-10-CM

## 2016-10-02 DIAGNOSIS — R002 Palpitations: Secondary | ICD-10-CM

## 2016-10-02 DIAGNOSIS — I491 Atrial premature depolarization: Secondary | ICD-10-CM | POA: Diagnosis not present

## 2016-10-02 DIAGNOSIS — E785 Hyperlipidemia, unspecified: Secondary | ICD-10-CM

## 2016-10-02 NOTE — Patient Instructions (Signed)
Please check with your pulmonary physician or primary care physician regarding possible sleep study testing.   Follow-Up: Your physician wants you to follow-up in: 6 months with Dr. Yvone Neu. You will receive a reminder letter in the mail two months in advance. If you don't receive a letter, please call our office to schedule the follow-up appointment.    It was a pleasure seeing you today here in the office. Please do not hesitate to give [name] a call back if you have any further questions. [place], BSN

## 2016-10-03 LAB — ACUTE HEP PANEL AND HEP B SURFACE AB
HCV Ab: NEGATIVE
Hep A IgM: NONREACTIVE
Hep B C IgM: NONREACTIVE
Hep B S Ab: POSITIVE — AB
Hepatitis B Surface Ag: NEGATIVE

## 2016-10-03 LAB — IRON,TIBC AND FERRITIN PANEL
%SAT: 15 % (ref 11–50)
Ferritin: 12 ng/mL — ABNORMAL LOW (ref 20–288)
Iron: 62 ug/dL (ref 45–160)
TIBC: 426 ug/dL (ref 250–450)

## 2016-10-03 LAB — ANA W/REFLEX IF POSITIVE: Anti Nuclear Antibody(ANA): NEGATIVE

## 2016-10-04 LAB — MITOCHONDRIAL ANTIBODIES: Mitochondrial M2 Ab, IgG: <=20 Units (ref <=20.0)

## 2016-10-04 LAB — CERULOPLASMIN: Ceruloplasmin: 31 mg/dL (ref 18–53)

## 2016-10-07 ENCOUNTER — Telehealth: Payer: Self-pay | Admitting: Family

## 2016-10-07 DIAGNOSIS — R0683 Snoring: Secondary | ICD-10-CM

## 2016-10-07 LAB — ANTI-SMOOTH MUSCLE ANTIBODY, IGG: Smooth Muscle Ab: <20 U (ref <20)

## 2016-10-07 NOTE — Telephone Encounter (Signed)
Please call patient and let her know I ordered a sleep study at rec of her cardiologist due to her snoring, fatigue.

## 2016-10-08 ENCOUNTER — Ambulatory Visit
Admission: RE | Admit: 2016-10-08 | Discharge: 2016-10-08 | Disposition: A | Discharge status: Home / Self Care | Payer: 59 | Source: Ambulatory Visit | Attending: Cardiology | Admitting: Cardiology

## 2016-10-08 DIAGNOSIS — I493 Ventricular premature depolarization: Secondary | ICD-10-CM | POA: Insufficient documentation

## 2016-10-08 DIAGNOSIS — R0602 Shortness of breath: Secondary | ICD-10-CM | POA: Diagnosis not present

## 2016-10-08 DIAGNOSIS — R002 Palpitations: Secondary | ICD-10-CM | POA: Diagnosis not present

## 2016-10-08 NOTE — Telephone Encounter (Signed)
Left message for patient to return call back.  

## 2016-10-17 DIAGNOSIS — E785 Hyperlipidemia, unspecified: Secondary | ICD-10-CM | POA: Diagnosis not present

## 2016-10-17 DIAGNOSIS — E1165 Type 2 diabetes mellitus with hyperglycemia: Secondary | ICD-10-CM | POA: Diagnosis not present

## 2016-10-21 ENCOUNTER — Other Ambulatory Visit: Payer: Self-pay | Admitting: *Deleted

## 2016-10-21 MED ORDER — OMEPRAZOLE 20 MG PO CPDR: 20.0000 mg | DELAYED_RELEASE_CAPSULE | Freq: Every day | ORAL | 3 refills | Status: DC

## 2016-10-25 ENCOUNTER — Telehealth: Payer: 59 | Admitting: Family

## 2016-10-25 DIAGNOSIS — N390 Urinary tract infection, site not specified: Secondary | ICD-10-CM | POA: Diagnosis not present

## 2016-10-25 MED ORDER — NITROFURANTOIN MONOHYD MACRO 100 MG PO CAPS: 100.0000 mg | ORAL_CAPSULE | Freq: Two times a day (BID) | ORAL | 0 refills | Status: DC

## 2016-10-25 NOTE — Progress Notes (Signed)
We are sorry that you are not feeling well.  Here is how we plan to help!  Based on what you shared with me it looks like you most likely have a simple urinary tract infection.  A UTI (Urinary Tract Infection) is a bacterial infection of the bladder.  Most cases of urinary tract infections are simple to treat but a key part of your care is to encourage you to drink plenty of fluids and watch your symptoms carefully.  I have prescribed MacroBid 100 mg twice a day for 5 days. (CBC/CMP/GFR/Cr/BUN is within acceptable limits regarding geri pt with this med and benefits outweigh risk for short-term tx given other pt allergies) Your symptoms should gradually improve. Call us if the burning in your urine worsens, you develop worsening fever, back pain or pelvic pain or if your symptoms do not resolve after completing the antibiotic.  Urinary tract infections can be prevented by drinking plenty of water to keep your body hydrated.  Also be sure when you wipe, wipe from front to back and don't hold it in!  If possible, empty your bladder every 4 hours.  Your e-visit answers were reviewed by a board certified advanced clinical practitioner to complete your personal care plan.  Depending on the condition, your plan could have included both over the counter or prescription medications.  If there is a problem please reply  once you have received a response from your provider.  Your safety is important to [name].  If you have drug allergies check your prescription carefully.    You can use MyChart to ask questions about today's visit, request a non-urgent call back, or ask for a work or school excuse for 24 hours related to this e-Visit. If it has been greater than 24 hours you will need to follow up with your provider, or enter a new e-Visit to address those concerns.   You will get an e-mail in the next two days asking about your experience.  I hope that your e-visit has been valuable and will speed your  recovery. Thank you for using e-visits.

## 2016-10-29 ENCOUNTER — Ambulatory Visit: Payer: 59

## 2016-10-31 DIAGNOSIS — M1711 Unilateral primary osteoarthritis, right knee: Secondary | ICD-10-CM | POA: Diagnosis not present

## 2016-11-07 DIAGNOSIS — M1711 Unilateral primary osteoarthritis, right knee: Secondary | ICD-10-CM | POA: Diagnosis not present

## 2016-11-14 DIAGNOSIS — M1711 Unilateral primary osteoarthritis, right knee: Secondary | ICD-10-CM | POA: Diagnosis not present

## 2016-11-29 ENCOUNTER — Telehealth: Payer: 59 | Admitting: Family

## 2016-11-29 DIAGNOSIS — R300 Dysuria: Secondary | ICD-10-CM | POA: Diagnosis not present

## 2016-11-29 MED ORDER — PHENAZOPYRIDINE HCL 100 MG PO TABS: 100.0000 mg | ORAL_TABLET | Freq: Three times a day (TID) | ORAL | 0 refills | Status: DC

## 2016-11-29 NOTE — Progress Notes (Signed)
We are sorry that you are not feeling well.  Here is how we plan to help!  Based on what you shared with me it looks like you most likely have a simple urinary tract infection.  A UTI (Urinary Tract Infection) is a bacterial infection of the bladder.  Most cases of urinary tract infections are simple to treat but a key part of your care is to encourage you to drink plenty of fluids and watch your symptoms carefully.  I have prescribed Pyridium 100mg  by mouth 3 times daily after meals (6 day supply). As per your request, this should help with your discomfort and should carry you through to your physical exam on Wednesday where they can do further testing and perhaps a curine culture as well. I agree with you on this; giving you more antibiotics at this time may not be ideal until we have more information from testing face-to-face.  Your symptoms should gradually improve. Call us if the burning in your urine worsens, you develop worsening fever, back pain or pelvic pain or if your symptoms do not resolve after completing the antibiotic.   Urinary tract infections can be prevented by drinking plenty of water to keep your body hydrated.  Also be sure when you wipe, wipe from front to back and don't hold it in!  If possible, empty your bladder every 4 hours.   Your e-visit answers were reviewed by a board certified advanced clinical practitioner to complete your personal care plan.  Depending on the condition, your plan could have included both over the counter or prescription medications.  If there is a problem please reply  once you have received a response from your provider.  Your safety is important to [name].  If you have drug allergies check your prescription carefully.    You can use MyChart to ask questions about today's visit, request a non-urgent call back, or ask for a work or school excuse for 24 hours related to this e-Visit. If it has been greater than 24 hours you will need to follow up with  your provider, or enter a new e-Visit to address those concerns.   You will get an e-mail in the next two days asking about your experience.  I hope that your e-visit has been valuable and will speed your recovery. Thank you for using e-visits.

## 2016-12-03 ENCOUNTER — Ambulatory Visit
Admission: RE | Admit: 2016-12-03 | Discharge: 2016-12-03 | Disposition: A | Discharge status: Home / Self Care | Payer: 59 | Source: Ambulatory Visit | Attending: Family | Admitting: Family

## 2016-12-03 ENCOUNTER — Encounter: Payer: Self-pay | Admitting: Family

## 2016-12-03 DIAGNOSIS — M85852 Other specified disorders of bone density and structure, left thigh: Secondary | ICD-10-CM | POA: Insufficient documentation

## 2016-12-03 DIAGNOSIS — Z7689 Persons encountering health services in other specified circumstances: Secondary | ICD-10-CM | POA: Diagnosis not present

## 2016-12-03 DIAGNOSIS — R748 Abnormal levels of other serum enzymes: Secondary | ICD-10-CM | POA: Insufficient documentation

## 2016-12-03 NOTE — Progress Notes (Signed)
Subjective:    Patient ID: Vanessa Higgins, female    DOB: [birth year], 65 y.o.   MRN: [id number]  CC: Vanessa Higgins is a 65 y.o. female who presents today for physical exam.    HPI: DM- A1c increased. Follows with Dr. Eddie Dibbles who recently added victoza and BS are much better.   HTN- Coimpliant with medication. Denies exertional chest pain or pressure, numbness or tingling radiating to left arm or jaw, palpitations, dizziness, frequent headaches, changes in vision, or shortness of breath. Averages 140-150/ 90.    Elevated liver enzymes- would like like to defer RUQ [name] at this time.     Colorectal Cancer Screening: UTD 2015 Dr Vira Agar. Repeat in 5 years due to surgical pathology and polpys. See report.  Breast Cancer Screening: Mammogram UTD Cervical Cancer Screening: UTD 2016, normal. No records of this. Done at [facility] in [place]. Had yearly pap's had been done regularly; no abnormal or h/o GYN cancer. Would like stop pap smears going forward.  Bone Health screening/DEXA for 65+: Osteopenia 12/12017 Lung Cancer Screening: Doesn't have 30 year pack year history and age > 34 years.  Immunizations       Tetanus - UTD        Pneumococcal - Candidate for pneumomax 23  Labs: Done Exercise: Gets regular exercise.  Alcohol use: None Smoking/tobacco use: Nonsmoker.  Wears seat belt: Yes.  HISTORY:  Past Medical History:  Diagnosis Date  . Allergy    hay fever  . Ankle fracture, left 04/08/2014  . Arthritis   . Asthma   . Blood transfusion abn reaction or complication, no procedure mishap   . Diabetes mellitus   . GERD (gastroesophageal reflux disease)   . Headache(784.0)   . Hyperlipidemia   . Hypertension   . Migraine   . UTI (lower urinary tract infection)   . Wears glasses     Past Surgical History:  Procedure Laterality Date  . APPENDECTOMY  1977  . COLONOSCOPY    . ORIF ANKLE FRACTURE Left 04/08/2014   Procedure: OPEN REDUCTION INTERNAL FIXATION (ORIF) ANKLE  FRACTURE LEFT ANKLE FRACTURE OPEN TREATMENT BIMALLEOLAR ANKLE INCLUDES INTERNAL FIXATION;  Surgeon: Johnny Bridge, MD;  Location: [facility];  Service: Orthopedics;  Laterality: Left;  . PAROTIDECTOMY  1980   left  . TONSILLECTOMY AND ADENOIDECTOMY     as a child  . 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 TUBAL LIGATION  05/1976   Family History  Problem Relation Age of Onset  . Sudden death Mother   . Cancer Mother 68    pancreatic  . Heart disease Father 41  . Sudden death Father   . Cancer Brother     colon  . Stroke Maternal Grandmother   . Heart disease Maternal Grandfather   . Cancer Sister     brain  . Breast cancer Neg Hx       ALLERGIES: Sulfa antibiotics and Ramipril  Current Outpatient Prescriptions on File Prior to Visit  Medication Sig Dispense Refill  . ADVAIR DISKUS 250-50 MCG/DOSE AEPB INHALE 1 PUFF INTO THE LUNGS 2 TIMES DAILY. 60 each 6  . Albuterol Sulfate 108 (90 Base) MCG/ACT AEPB Take 2 puffs by mouth as needed.     Marland Kitchen aspirin 81 MG tablet Take 81 mg by mouth daily.    Marland Kitchen atorvastatin (LIPITOR) 10 MG tablet Take 10 mg by mouth every evening.     Marland Kitchen b complex vitamins  tablet Take 1 tablet by mouth daily.    . canagliflozin (INVOKANA) 300 MG TABS tablet Take 300 mg by mouth daily before breakfast.    . glucose blood (BAYER CONTOUR NEXT TEST) test strip Use 2 (two) times daily.    Marland Kitchen loratadine (CLARITIN) 10 MG tablet Take 10 mg by mouth daily.    Marland Kitchen losartan (COZAAR) 25 MG tablet Take 1 tablet (25 mg total) by mouth daily. 90 tablet 1  . metFORMIN (GLUCOPHAGE) 1000 MG tablet Take 1 tablet (1,000 mg total) by mouth 2 (two) times daily with a meal. 60 tablet 5  . montelukast (SINGULAIR) 10 MG tablet TAKE 1 TABLET BY MOUTH AT BEDTIME. 90 tablet 3  . Multiple Vitamins-Minerals (CENTRUM SILVER PO) Take 1 tablet by mouth daily. Reported on 04/17/2016    . Omega-3 Fatty Acids (FISH OIL) 1200 MG CAPS Take 1 capsule by mouth daily.    Marland Kitchen omeprazole  (PRILOSEC) 20 MG capsule Take 1 capsule (20 mg total) by mouth daily. 90 capsule 3  . venlafaxine XR (EFFEXOR-XR) 150 MG 24 hr capsule TAKE 1 CAPSULE BY MOUTH DAILY WITH BREAKFAST. 90 capsule 3  . Verapamil HCl CR 300 MG CP24 TAKE 1 CAPSULE BY MOUTH ONCE DAILY 90 capsule 3   No current facility-administered medications on file prior to visit.     Social History  Substance Use Topics  . Smoking status: Never Smoker  . Smokeless tobacco: Never Used  . Alcohol use No    Review of Systems  Constitutional: Negative for chills, fever and unexpected weight change.  HENT: Negative for congestion.   Respiratory: Negative for cough.   Cardiovascular: Negative for chest pain, palpitations and leg swelling.  Gastrointestinal: Negative for nausea and vomiting.  Musculoskeletal: Negative for arthralgias and myalgias.  Skin: Negative for rash.  Neurological: Negative for headaches.  Hematological: Negative for adenopathy.  Psychiatric/Behavioral: Negative for confusion.      Objective:    BP (!) 150/80   Pulse 81   Temp 98.3 F (36.8 C) (Oral)   Ht 5\' 5"  (1.651 m)   Wt 211 lb 6.4 oz (95.9 kg)   SpO2 93%   BMI 35.18 kg/m   BP Readings from Last 3 Encounters:  12/04/16 (!) 150/80  10/02/16 124/80  09/19/16 118/68   Wt Readings from Last 3 Encounters:  12/04/16 209 lb (94.8 kg)  12/04/16 211 lb 6.4 oz (95.9 kg)  10/02/16 218 lb 1.9 oz (98.9 kg)    Physical Exam  Constitutional: She appears well-developed and well-nourished.  Eyes: Conjunctivae are normal.  Neck: No thyroid mass and no thyromegaly present.  Cardiovascular: Normal rate, regular rhythm, normal heart sounds and normal pulses.   Pulmonary/Chest: Effort normal and breath sounds normal. She has no wheezes. She has no rhonchi. She has no rales. Right breast exhibits no inverted nipple, no mass, no nipple discharge, no skin change and no tenderness. Left breast exhibits no inverted nipple, no mass, no nipple discharge, no  skin change and no tenderness. Breasts are symmetrical.  CBE performed.   Lymphadenopathy:       Head (right side): No submental, no submandibular, no tonsillar, no preauricular, no posterior auricular and no occipital adenopathy present.       Head (left side): No submental, no submandibular, no tonsillar, no preauricular, no posterior auricular and no occipital adenopathy present.    She has no cervical adenopathy.       Right cervical: No superficial cervical, no deep cervical and no posterior  cervical adenopathy present.      Left cervical: No superficial cervical, no deep cervical and no posterior cervical adenopathy present.    She has no axillary adenopathy.  Neurological: She is alert.  Skin: Skin is warm and dry.  Psychiatric: She has a normal mood and affect. Her speech is normal and behavior is normal. Thought content normal.  Vitals reviewed.      Assessment & Plan:   Problem List Items Addressed This Visit      Cardiovascular and Mediastinum   Hypertension    Slightly elevated today. Patient will keep log and notify if persistently elevated.         Endocrine   Diabetes mellitus type 2, controlled ([facility])    Pending a1c in one month. Suspect improvement after Dr Eddie Dibbles started victoza.       Relevant Medications   Liraglutide (VICTOZA [place])   Other Relevant Orders   Hemoglobin A1c     Other   Hyperlipidemia    On lipitor. LDL < 70. Continue.      Routine general medical examination at a health care facility - Primary    Up-to-date on colonoscopy, mammogram, Pap smear. She would like to stop doing Pap smears; she's had a history of regular Pap smears which have been normal and no history of GYN cancer. This is appropriate. We discussed her DEXA scan which showed osteopenia and jointly decided to treat with lifestyle modifications. Nonsmoker. Given pneumococcal vaccine today. Encouraged exercise.      Relevant Medications   pneumococcal 23 valent vaccine (PNEUMOVAX  23) 25 MCG/0.5ML injection   Other Relevant Orders   Hemoglobin A1c   POCT Urinalysis Dipstick (Completed)   Elevated liver enzymes    No focal infiltrate seen in 2015. Declines repeat RUQ [name]. Discussed fatty liver lifestyle modifications including low trans fat diet, maintaining a healthy weight. Emphasized importance of keeping cholesterol, BP, and A1c at goal. Will continue to follow.        Relevant Orders   Comprehensive metabolic panel       I have discontinued Ms. Kren's Dulaglutide and nitrofurantoin (macrocrystal-monohydrate). I am also having her start on pneumococcal 23 valent vaccine. Additionally, I am having her maintain her Multiple Vitamins-Minerals (CENTRUM SILVER PO), aspirin, Fish Oil, b complex vitamins, loratadine, metFORMIN, atorvastatin, canagliflozin, glucose blood, ADVAIR DISKUS, Albuterol Sulfate, Verapamil HCl CR, montelukast, losartan, venlafaxine XR, omeprazole, and Liraglutide (VICTOZA [place]).   Meds ordered this encounter  Medications  . pneumococcal 23 valent vaccine (PNEUMOVAX 23) 25 MCG/0.5ML injection    Sig: Inject 0.5 mLs into the muscle once.    Dispense:  0.5 mL    Refill:  0    Order Specific Question:   Supervising Provider    Answer:   Deborra Medina L [[id number]]  . Liraglutide (VICTOZA [place])    Sig: Inject 1.8 mg into the skin at bedtime.     Return precautions given.   Risks, benefits, and alternatives of the medications and treatment plan prescribed today were discussed, and patient expressed understanding.   Education regarding symptom management and diagnosis given to patient on AVS.   Continue to follow with Mable Paris, FNP for routine health maintenance.   [name] and I agreed with plan.   Mable Paris, FNP

## 2016-12-04 ENCOUNTER — Other Ambulatory Visit: Payer: Self-pay | Admitting: Family

## 2016-12-04 ENCOUNTER — Encounter: Payer: Self-pay | Admitting: Family

## 2016-12-04 ENCOUNTER — Ambulatory Visit: Payer: 59 | Admitting: Pharmacist

## 2016-12-04 ENCOUNTER — Other Ambulatory Visit: Payer: Self-pay | Admitting: Pharmacist

## 2016-12-04 ENCOUNTER — Ambulatory Visit (INDEPENDENT_AMBULATORY_CARE_PROVIDER_SITE_OTHER): Payer: 59 | Admitting: Family

## 2016-12-04 VITALS — BP 150/80 | HR 81 | Temp 98.3°F | Ht 65.0 in | Wt 211.4 lb

## 2016-12-04 DIAGNOSIS — Z23 Encounter for immunization: Secondary | ICD-10-CM

## 2016-12-04 DIAGNOSIS — Z0000 Encounter for general adult medical examination without abnormal findings: Secondary | ICD-10-CM

## 2016-12-04 DIAGNOSIS — E119 Type 2 diabetes mellitus without complications: Secondary | ICD-10-CM | POA: Diagnosis not present

## 2016-12-04 DIAGNOSIS — N3001 Acute cystitis with hematuria: Secondary | ICD-10-CM

## 2016-12-04 DIAGNOSIS — I10 Essential (primary) hypertension: Secondary | ICD-10-CM

## 2016-12-04 DIAGNOSIS — E785 Hyperlipidemia, unspecified: Secondary | ICD-10-CM | POA: Diagnosis not present

## 2016-12-04 DIAGNOSIS — R748 Abnormal levels of other serum enzymes: Secondary | ICD-10-CM

## 2016-12-04 LAB — POCT URINALYSIS DIPSTICK
Bilirubin, UA: NEGATIVE
Glucose, UA: 100
Ketones, UA: NEGATIVE
Nitrite, UA: POSITIVE
Protein, UA: NEGATIVE
Spec Grav, UA: <=1.005
Urobilinogen, UA: 0.2
pH, UA: 5.5

## 2016-12-04 MED ORDER — PNEUMOCOCCAL VAC POLYVALENT 25 MCG/0.5ML IJ INJ: 0.5000 mL | INJECTION | Freq: Once | INTRAMUSCULAR | 0 refills | Status: AC

## 2016-12-04 MED ORDER — CIPROFLOXACIN HCL 250 MG PO TABS: 250.0000 mg | ORAL_TABLET | Freq: Two times a day (BID) | ORAL | 0 refills | Status: DC

## 2016-12-04 NOTE — Patient Instructions (Signed)
BP log; call if values > 140/90.  Health Maintenance, Female Introduction Adopting a healthy lifestyle and getting preventive care can go a long way to promote health and wellness. Talk with your health care provider about what schedule of regular examinations is right for you. This is a good chance for you to check in with your provider about disease prevention and staying healthy. In between checkups, there are plenty of things you can do on your own. Experts have done a lot of research about which lifestyle changes and preventive measures are most likely to keep you healthy. Ask your health care provider for more information. Weight and diet Eat a healthy diet  Be sure to include plenty of vegetables, fruits, low-fat dairy products, and lean protein.  Do not eat a lot of foods high in solid fats, added sugars, or salt.  Get regular exercise. This is one of the most important things you can do for your health.  Most adults should exercise for at least 150 minutes each week. The exercise should increase your heart rate and make you sweat (moderate-intensity exercise).  Most adults should also do strengthening exercises at least twice a week. This is in addition to the moderate-intensity exercise. Maintain a healthy weight  Body mass index (BMI) is a measurement that can be used to identify possible weight problems. It estimates body fat based on height and weight. Your health care provider can help determine your BMI and help you achieve or maintain a healthy weight.  For females 73 years of age and older:  A BMI below 18.5 is considered underweight.  A BMI of 18.5 to 24.9 is normal.  A BMI of 25 to 29.9 is considered overweight.  A BMI of 30 and above is considered obese. Watch levels of cholesterol and blood lipids  You should start having your blood tested for lipids and cholesterol at 67 years of age, then have this test every 5 years.  You may need to have your cholesterol  levels checked more often if:  Your lipid or cholesterol levels are high.  You are older than 67 years of age.  You are at high risk for heart disease. Cancer screening Lung Cancer  Lung cancer screening is recommended for adults 8-21 years old who are at high risk for lung cancer because of a history of smoking.  A yearly low-dose CT scan of the lungs is recommended for people who:  Currently smoke.  Have quit within the past 15 years.  Have at least a 30-pack-year history of smoking. A pack year is smoking an average of one pack of cigarettes a day for 1 year.  Yearly screening should continue until it has been 15 years since you quit.  Yearly screening should stop if you develop a health problem that would prevent you from having lung cancer treatment. Breast Cancer  Practice breast self-awareness. This means understanding how your breasts normally appear and feel.  It also means doing regular breast self-exams. Let your health care provider know about any changes, no matter how small.  If you are in your 20s or 30s, you should have a clinical breast exam (CBE) by a health care provider every 1-3 years as part of a regular health exam.  If you are 69 or older, have a CBE every year. Also consider having a breast X-ray (mammogram) every year.  If you have a family history of breast cancer, talk to your health care provider about genetic screening.  If  you are at high risk for breast cancer, talk to your health care provider about having an MRI and a mammogram every year.  Breast cancer gene (BRCA) assessment is recommended for women who have family members with BRCA-related cancers. BRCA-related cancers include:  Breast.  Ovarian.  Tubal.  Peritoneal cancers.  Results of the assessment will determine the need for genetic counseling and BRCA1 and BRCA2 testing. Cervical Cancer  Your health care provider may recommend that you be screened regularly for cancer of the  pelvic organs (ovaries, uterus, and vagina). This screening involves a pelvic examination, including checking for microscopic changes to the surface of your cervix (Pap test). You may be encouraged to have this screening done every 3 years, beginning at age 23.  For women ages 13-65, health care providers may recommend pelvic exams and Pap testing every 3 years, or they may recommend the Pap and pelvic exam, combined with testing for human papilloma virus (HPV), every 5 years. Some types of HPV increase your risk of cervical cancer. Testing for HPV may also be done on women of any age with unclear Pap test results.  Other health care providers may not recommend any screening for nonpregnant women who are considered low risk for pelvic cancer and who do not have symptoms. Ask your health care provider if a screening pelvic exam is right for you.  If you have had past treatment for cervical cancer or a condition that could lead to cancer, you need Pap tests and screening for cancer for at least 20 years after your treatment. If Pap tests have been discontinued, your risk factors (such as having a new sexual partner) need to be reassessed to determine if screening should resume. Some women have medical problems that increase the chance of getting cervical cancer. In these cases, your health care provider may recommend more frequent screening and Pap tests. Colorectal Cancer  This type of cancer can be detected and often prevented.  Routine colorectal cancer screening usually begins at 67 years of age and continues through 67 years of age.  Your health care provider may recommend screening at an earlier age if you have risk factors for colon cancer.  Your health care provider may also recommend using home test kits to check for hidden blood in the stool.  A small camera at the end of a tube can be used to examine your colon directly (sigmoidoscopy or colonoscopy). This is done to check for the earliest  forms of colorectal cancer.  Routine screening usually begins at age 74.  Direct examination of the colon should be repeated every 5-10 years through 67 years of age. However, you may need to be screened more often if early forms of precancerous polyps or small growths are found. Skin Cancer  Check your skin from head to toe regularly.  Tell your health care provider about any new moles or changes in moles, especially if there is a change in a mole's shape or color.  Also tell your health care provider if you have a mole that is larger than the size of a pencil eraser.  Always use sunscreen. Apply sunscreen liberally and repeatedly throughout the day.  Protect yourself by wearing long sleeves, pants, a wide-brimmed hat, and sunglasses whenever you are outside. Heart disease, diabetes, and high blood pressure  High blood pressure causes heart disease and increases the risk of stroke. High blood pressure is more likely to develop in:  People who have blood pressure in the high  end of the normal range (130-139/85-89 mm Hg).  People who are overweight or obese.  People who are African American.  If you are 46-81 years of age, have your blood pressure checked every 3-5 years. If you are 35 years of age or older, have your blood pressure checked every year. You should have your blood pressure measured twice-once when you are at a hospital or clinic, and once when you are not at a hospital or clinic. Record the average of the two measurements. To check your blood pressure when you are not at a hospital or clinic, you can use:  An automated blood pressure machine at a pharmacy.  A home blood pressure monitor.  If you are between 33 years and 58 years old, ask your health care provider if you should take aspirin to prevent strokes.  Have regular diabetes screenings. This involves taking a blood sample to check your fasting blood sugar level.  If you are at a normal weight and have a low  risk for diabetes, have this test once every three years after 67 years of age.  If you are overweight and have a high risk for diabetes, consider being tested at a younger age or more often. Preventing infection Hepatitis B  If you have a higher risk for hepatitis B, you should be screened for this virus. You are considered at high risk for hepatitis B if:  You were born in a country where hepatitis B is common. Ask your health care provider which countries are considered high risk.  Your parents were born in a high-risk country, and you have not been immunized against hepatitis B (hepatitis B vaccine).  You have HIV or AIDS.  You use needles to inject street drugs.  You live with someone who has hepatitis B.  You have had sex with someone who has hepatitis B.  You get hemodialysis treatment.  You take certain medicines for conditions, including cancer, organ transplantation, and autoimmune conditions. Hepatitis C  Blood testing is recommended for:  Everyone born from [birth year] through 1967.  Anyone with known risk factors for hepatitis C. Sexually transmitted infections (STIs)  You should be screened for sexually transmitted infections (STIs) including gonorrhea and chlamydia if:  You are sexually active and are younger than 67 years of age.  You are older than 67 years of age and your health care provider tells you that you are at risk for this type of infection.  Your sexual activity has changed since you were last screened and you are at an increased risk for chlamydia or gonorrhea. Ask your health care provider if you are at risk.  If you do not have HIV, but are at risk, it may be recommended that you take a prescription medicine daily to prevent HIV infection. This is called pre-exposure prophylaxis (PrEP). You are considered at risk if:  You are sexually active and do not regularly use condoms or know the HIV status of your partner(s).  You take drugs by  injection.  You are sexually active with a partner who has HIV. Talk with your health care provider about whether you are at high risk of being infected with HIV. If you choose to begin PrEP, you should first be tested for HIV. You should then be tested every 3 months for as long as you are taking PrEP. Pregnancy  If you are premenopausal and you may become pregnant, ask your health care provider about preconception counseling.  If you may become pregnant,  take 400 to 800 micrograms (mcg) of folic acid every day.  If you want to prevent pregnancy, talk to your health care provider about birth control (contraception). Osteoporosis and menopause  Osteoporosis is a disease in which the bones lose minerals and strength with aging. This can result in serious bone fractures. Your risk for osteoporosis can be identified using a bone density scan.  If you are 46 years of age or older, or if you are at risk for osteoporosis and fractures, ask your health care provider if you should be screened.  Ask your health care provider whether you should take a calcium or vitamin D supplement to lower your risk for osteoporosis.  Menopause may have certain physical symptoms and risks.  Hormone replacement therapy may reduce some of these symptoms and risks. Talk to your health care provider about whether hormone replacement therapy is right for you. Follow these instructions at home:  Schedule regular health, dental, and eye exams.  Stay current with your immunizations.  Do not use any tobacco products including cigarettes, chewing tobacco, or electronic cigarettes.  If you are pregnant, do not drink alcohol.  If you are breastfeeding, limit how much and how often you drink alcohol.  Limit alcohol intake to no more than 1 drink per day for nonpregnant women. One drink equals 12 ounces of beer, 5 ounces of wine, or 1 ounces of hard liquor.  Do not use street drugs.  Do not share needles.  Ask  your health care provider for help if you need support or information about quitting drugs.  Tell your health care provider if you often feel depressed.  Tell your health care provider if you have ever been abused or do not feel safe at home. This information is not intended to replace advice given to you by your health care provider. Make sure you discuss any questions you have with your health care provider. Document Released: 07/01/2011 Document Revised: 05/23/2016 Document Reviewed: 09/19/2015  2017 [employer]vier

## 2016-12-04 NOTE — Assessment & Plan Note (Addendum)
On lipitor. LDL < 70. Continue.

## 2016-12-04 NOTE — Assessment & Plan Note (Signed)
Up-to-date on colonoscopy, mammogram, Pap smear. She would like to stop doing Pap smears; she's had a history of regular Pap smears which have been normal and no history of GYN cancer. This is appropriate. We discussed her DEXA scan which showed osteopenia and jointly decided to treat with lifestyle modifications. Nonsmoker. Given pneumococcal vaccine today. Encouraged exercise.

## 2016-12-04 NOTE — Assessment & Plan Note (Signed)
Slightly elevated today. Patient will keep log and notify if persistently elevated.

## 2016-12-04 NOTE — Assessment & Plan Note (Signed)
Pending a1c in one month. Suspect improvement after Dr Eddie Dibbles started victoza.

## 2016-12-04 NOTE — Patient Outreach (Signed)
[facility]) Care Management  [place]   12/04/2016  Vanessa Higgins 08-19-1951 [id number]  Subjective:  Patient presents today for 3 month diabetes follow-up as part of the employer-sponsored Link To Wellness program. Current diabetes regimen includes Victoza, and metformin. Patient continues on daily aspirin, ARB and a statin. Recently diagnosed with a UTI. Continues to have burning and pressure. The Invokana was stopped on Monday due to UTI symptoms. Patient completed her physical exam this morning. She is current with her dental and vision exams.  She recently received the third Synvisc injection on 11/14/16 to the right knee and states that she has been able to start exercising again.  Objective:  A1c = 7.6% (09/19/16) TC = 134 mg/dl; TG = 102 mg/dl; HDL = 44 mg/dl; LDL = 69 mg/dl (09/19/16)  Vitals:   12/04/16 1106  BP: (!) 150/80  Weight: 209 lb (94.8 kg)  Height: 1.638 m (5' 4.5")    Encounter Medications: Outpatient Encounter Prescriptions as of 12/04/2016  Medication Sig Note  . ADVAIR DISKUS 250-50 MCG/DOSE AEPB INHALE 1 PUFF INTO THE LUNGS 2 TIMES DAILY.   Marland Kitchen Albuterol Sulfate 108 (90 Base) MCG/ACT AEPB Take 2 puffs by mouth as needed.  06/05/2016: Received from: [place]  . aspirin 81 MG tablet Take 81 mg by mouth daily.   Marland Kitchen atorvastatin (LIPITOR) 10 MG tablet Take 10 mg by mouth every evening.    Marland Kitchen b complex vitamins tablet Take 1 tablet by mouth daily.   . ciprofloxacin (CIPRO) 250 MG tablet Take 1 tablet (250 mg total) by mouth 2 (two) times daily.   Marland Kitchen glucose blood (BAYER CONTOUR NEXT TEST) test strip Use 2 (two) times daily. 12/06/2015: Received from: [place]  . Liraglutide (VICTOZA [place]) Inject 1.8 mg into the skin at bedtime.    Marland Kitchen loratadine (CLARITIN) 10 MG tablet Take 10 mg by mouth daily.   Marland Kitchen losartan (COZAAR) 25 MG tablet Take 1 tablet (25 mg total) by mouth daily.   . metFORMIN (GLUCOPHAGE) 1000 MG  tablet Take 1 tablet (1,000 mg total) by mouth 2 (two) times daily with a meal.   . montelukast (SINGULAIR) 10 MG tablet TAKE 1 TABLET BY MOUTH AT BEDTIME.   . Multiple Vitamins-Minerals (CENTRUM SILVER PO) Take 1 tablet by mouth daily. Reported on 04/17/2016   . Omega-3 Fatty Acids (FISH OIL) 1200 MG CAPS Take 1 capsule by mouth daily.   Marland Kitchen omeprazole (PRILOSEC) 20 MG capsule Take 1 capsule (20 mg total) by mouth daily.   . pneumococcal 23 valent vaccine (PNEUMOVAX 23) 25 MCG/0.5ML injection Inject 0.5 mLs into the muscle once.   . venlafaxine XR (EFFEXOR-XR) 150 MG 24 hr capsule TAKE 1 CAPSULE BY MOUTH DAILY WITH BREAKFAST.   Marland Kitchen Verapamil HCl CR 300 MG CP24 TAKE 1 CAPSULE BY MOUTH ONCE DAILY   . canagliflozin (INVOKANA) 300 MG TABS tablet Take 300 mg by mouth daily before breakfast. 12/04/2016: Temporarily holding due to recent UTI  . [DISCONTINUED] phenazopyridine (PYRIDIUM) 100 MG tablet Take 1 tablet (100 mg total) by mouth 3 (three) times daily after meals. (Patient not taking: Reported on 12/04/2016)    No facility-administered encounter medications on file as of 12/04/2016.     Functional Status: In your present state of health, do you have any difficulty performing the following activities: 12/04/2016 08/28/2016  Hearing? N -  Vision? N -  Difficulty concentrating or making decisions? N -  Walking or climbing stairs?  Y Y  Dressing or bathing? N -  Doing errands, shopping? N -  Some recent data might be hidden    Fall/Depression Screening: PHQ 2/9 Scores 12/04/2016 09/19/2016 08/28/2016 06/05/2016 04/17/2016 01/17/2016 12/02/2014  PHQ - 2 Score 0 0 0 0 0 0 0   THN CM Care Plan Problem One   Flowsheet Row Most Recent Value  Care Plan Problem One  Maintain A1c at goal of less than 7%  Role Documenting the Problem One  Clinical Pharmacist  Care Plan for Problem One  Active  THN Long Term Goal (31-90 days)  Patient will maintain A1c below 7% over the next 90 days,  evidenced by physician or  patient report  [place] Goal Start Date  08/28/16  Interventions for Problem One Long Term Goal  Continue exercising as tolerated 3 days per week. Continue to attend physician and pharmacy appointments. Continue current medication regimen and adherence. Continue to watch serving/portion sizes.      Assessment: Diabetes: A1c not within goal of less than 7%. Patient was started back on Victoza; Trulicity was discontinued. She is doing well with this treatment. Fasting blood sugar values are typically less than 150 mg/dl. She stopped the Invokana this week due to continued UTI symptoms. Patient will discuss with Dr. Eddie Dibbles. Patient is already enrolled with the [facility] employer-sponsored program. Hypertension: Not within goal of less than 140/90 mmHg. Cholesterol: HDL not within goal of > 50 mg/dl; all other values within goal. Exercise: Able to exercise some post 3rd Synvisc injection. Walking 3 times per week, 15 minutes per session. States right knee ultimately needs to be replaced; not ready for the surgery at this time.    Plan: Follow-up appointment with Dr. Eddie Dibbles 01/16/17. Follow up Link To Wellness visit 03/12/16 unless active in the [place] [facility]. Dicky Doe, PharmD [facility] [phone]

## 2016-12-04 NOTE — Progress Notes (Signed)
Pre visit review using our clinic review tool, if applicable. No additional management support is needed unless otherwise documented below in the visit note. 

## 2016-12-04 NOTE — Assessment & Plan Note (Signed)
No focal infiltrate seen in 2015. Declines repeat RUQ [name]. Discussed fatty liver lifestyle modifications including low trans fat diet, maintaining a healthy weight. Emphasized importance of keeping cholesterol, BP, and A1c at goal. Will continue to follow.

## 2016-12-04 NOTE — Addendum Note (Signed)
Addended by: Frutoso Chase A on: 12/04/2016 01:04 PM   Modules accepted: Orders

## 2016-12-06 LAB — CULTURE, URINE COMPREHENSIVE: Colony Count: >=100000

## 2016-12-08 ENCOUNTER — Other Ambulatory Visit: Payer: Self-pay | Admitting: Family

## 2016-12-20 ENCOUNTER — Other Ambulatory Visit: Payer: Self-pay

## 2016-12-20 ENCOUNTER — Encounter: Payer: Self-pay | Admitting: Family

## 2016-12-20 MED ORDER — FLUTICASONE-SALMETEROL 250-50 MCG/DOSE IN AEPB: INHALATION_SPRAY | RESPIRATORY_TRACT | 6 refills | Status: DC

## 2016-12-20 NOTE — Telephone Encounter (Signed)
Medication has been refilled.

## 2016-12-31 ENCOUNTER — Other Ambulatory Visit: Payer: 59

## 2016-12-31 ENCOUNTER — Telehealth: Payer: Self-pay | Admitting: Family

## 2016-12-31 DIAGNOSIS — R300 Dysuria: Secondary | ICD-10-CM | POA: Diagnosis not present

## 2016-12-31 NOTE — Telephone Encounter (Signed)
Spoke with patient states she is having dysuria , pressure .  Per Mable Paris, FNP,verbal orders to do urine and urine culture.  Appointment scheduled for lab.

## 2016-12-31 NOTE — Telephone Encounter (Signed)
lmtc need symptoms

## 2016-12-31 NOTE — Telephone Encounter (Signed)
Pt called and stated that she has another UTI, was advised by M. Arnett the last time that she can leave a urine sample. Please advise, thank you!  Call pt @ [phone]

## 2017-01-03 LAB — URINE CULTURE

## 2017-01-03 MED ORDER — AMOXICILLIN-POT CLAVULANATE 875-125 MG PO TABS: 1.0000 | ORAL_TABLET | Freq: Two times a day (BID) | ORAL | 0 refills | Status: AC

## 2017-01-03 MED ORDER — AMOXICILLIN-POT CLAVULANATE ER 1000-62.5 MG PO TB12: 2.0000 | ORAL_TABLET | Freq: Two times a day (BID) | ORAL | 0 refills | Status: DC

## 2017-01-03 NOTE — Telephone Encounter (Signed)
Patient was informed of results.  Patient understood and no questions, comments, or concerns at this time.  

## 2017-01-03 NOTE — Telephone Encounter (Signed)
Call pt-  She has uti Sent in augmentin based on urine culture  Let uis know if not better   Encourage probiotics.

## 2017-01-08 ENCOUNTER — Other Ambulatory Visit (INDEPENDENT_AMBULATORY_CARE_PROVIDER_SITE_OTHER): Payer: 59

## 2017-01-08 DIAGNOSIS — Z0000 Encounter for general adult medical examination without abnormal findings: Secondary | ICD-10-CM | POA: Diagnosis not present

## 2017-01-08 DIAGNOSIS — R748 Abnormal levels of other serum enzymes: Secondary | ICD-10-CM | POA: Diagnosis not present

## 2017-01-08 DIAGNOSIS — E119 Type 2 diabetes mellitus without complications: Secondary | ICD-10-CM

## 2017-01-08 LAB — COMPREHENSIVE METABOLIC PANEL
ALT: 39 U/L — ABNORMAL HIGH (ref 0–35)
AST: 49 U/L — ABNORMAL HIGH (ref 0–37)
Albumin: 4.3 g/dL (ref 3.5–5.2)
Alkaline Phosphatase: 40 U/L (ref 39–117)
BUN: 13 mg/dL (ref 6–23)
CO2: 28 mEq/L (ref 19–32)
Calcium: 9.6 mg/dL (ref 8.4–10.5)
Chloride: 101 mEq/L (ref 96–112)
Creatinine, Ser: 0.55 mg/dL (ref 0.40–1.20)
GFR: 117.02 mL/min (ref >60.00)
Glucose, Bld: 108 mg/dL — ABNORMAL HIGH (ref 70–99)
Potassium: 4 mEq/L (ref 3.5–5.1)
Sodium: 140 mEq/L (ref 135–145)
Total Bilirubin: 1 mg/dL (ref 0.2–1.2)
Total Protein: 7.6 g/dL (ref 6.0–8.3)

## 2017-01-08 LAB — HEMOGLOBIN A1C: Hgb A1c MFr Bld: 6.6 % — ABNORMAL HIGH (ref 4.6–6.5)

## 2017-01-17 ENCOUNTER — Other Ambulatory Visit: Payer: Self-pay

## 2017-01-17 MED ORDER — LOSARTAN POTASSIUM 25 MG PO TABS: 25.0000 mg | ORAL_TABLET | Freq: Every day | ORAL | 1 refills | Status: DC

## 2017-01-17 NOTE — Telephone Encounter (Signed)
Medication has been refilled.

## 2017-02-13 DIAGNOSIS — E669 Obesity, unspecified: Secondary | ICD-10-CM | POA: Diagnosis not present

## 2017-02-13 DIAGNOSIS — I10 Essential (primary) hypertension: Secondary | ICD-10-CM | POA: Diagnosis not present

## 2017-02-13 DIAGNOSIS — E785 Hyperlipidemia, unspecified: Secondary | ICD-10-CM | POA: Diagnosis not present

## 2017-02-13 DIAGNOSIS — E119 Type 2 diabetes mellitus without complications: Secondary | ICD-10-CM | POA: Diagnosis not present

## 2017-02-26 DIAGNOSIS — F458 Other somatoform disorders: Secondary | ICD-10-CM | POA: Diagnosis not present

## 2017-03-07 ENCOUNTER — Other Ambulatory Visit: Payer: Self-pay | Admitting: Family

## 2017-03-07 DIAGNOSIS — Z1231 Encounter for screening mammogram for malignant neoplasm of breast: Secondary | ICD-10-CM

## 2017-03-12 ENCOUNTER — Other Ambulatory Visit: Payer: Self-pay | Admitting: Pharmacist

## 2017-03-14 NOTE — Patient Outreach (Signed)
Vanessa Higgins missed her appointment scheduled for 03-12-17. Her cell phone was damaged and recently replaced. She called today to reschedule the missed appointment; new appointment is scheduled for 03/19/17 at 10 am.  Vanessa Higgins, PharmD [place] [facility] [phone]

## 2017-03-19 ENCOUNTER — Ambulatory Visit: Payer: Self-pay | Admitting: Pharmacist

## 2017-03-22 ENCOUNTER — Emergency Department: Payer: 59

## 2017-03-22 ENCOUNTER — Emergency Department
Admission: EM | Admit: 2017-03-22 | Discharge: 2017-03-22 | Disposition: A | Discharge status: Home / Self Care | Payer: 59 | Attending: Emergency Medicine | Admitting: Emergency Medicine

## 2017-03-22 DIAGNOSIS — I10 Essential (primary) hypertension: Secondary | ICD-10-CM | POA: Diagnosis not present

## 2017-03-22 DIAGNOSIS — Z7984 Long term (current) use of oral hypoglycemic drugs: Secondary | ICD-10-CM | POA: Insufficient documentation

## 2017-03-22 DIAGNOSIS — Z79899 Other long term (current) drug therapy: Secondary | ICD-10-CM | POA: Insufficient documentation

## 2017-03-22 DIAGNOSIS — E119 Type 2 diabetes mellitus without complications: Secondary | ICD-10-CM | POA: Diagnosis not present

## 2017-03-22 DIAGNOSIS — R42 Dizziness and giddiness: Secondary | ICD-10-CM | POA: Diagnosis not present

## 2017-03-22 DIAGNOSIS — J45909 Unspecified asthma, uncomplicated: Secondary | ICD-10-CM | POA: Diagnosis not present

## 2017-03-22 LAB — BASIC METABOLIC PANEL
Anion gap: 10 (ref 5–15)
BUN: 11 mg/dL (ref 6–20)
CO2: 26 mmol/L (ref 22–32)
Calcium: 9.3 mg/dL (ref 8.9–10.3)
Chloride: 100 mmol/L — ABNORMAL LOW (ref 101–111)
Creatinine, Ser: 0.7 mg/dL (ref 0.44–1.00)
GFR calc Af Amer: >60 mL/min (ref >60)
GFR calc non Af Amer: >60 mL/min (ref >60)
Glucose, Bld: 215 mg/dL — ABNORMAL HIGH (ref 65–99)
Potassium: 3.9 mmol/L (ref 3.5–5.1)
Sodium: 136 mmol/L (ref 135–145)

## 2017-03-22 LAB — CBC
HCT: 38.5 % (ref 35.0–47.0)
Hemoglobin: 12.6 g/dL (ref 12.0–16.0)
MCH: 28.4 pg (ref 26.0–34.0)
MCHC: 32.8 g/dL (ref 32.0–36.0)
MCV: 86.6 fL (ref 80.0–100.0)
Platelets: 304 10*3/uL (ref 150–440)
RBC: 4.45 MIL/uL (ref 3.80–5.20)
RDW: 17.9 % — ABNORMAL HIGH (ref 11.5–14.5)
WBC: 6.6 10*3/uL (ref 3.6–11.0)

## 2017-03-22 LAB — URINALYSIS, COMPLETE (UACMP) WITH MICROSCOPIC
Bacteria, UA: NONE SEEN
Bilirubin Urine: NEGATIVE
Glucose, UA: 50 mg/dL — AB
Hgb urine dipstick: NEGATIVE
Ketones, ur: NEGATIVE mg/dL
Leukocytes, UA: NEGATIVE
Nitrite: NEGATIVE
Protein, ur: NEGATIVE mg/dL
Specific Gravity, Urine: 1.014 (ref 1.005–1.030)
pH: 6 (ref 5.0–8.0)

## 2017-03-22 LAB — TROPONIN I: Troponin I: <0.03 ng/mL (ref <0.03)

## 2017-03-22 MED ORDER — MECLIZINE HCL 25 MG PO TABS: 25.0000 mg | ORAL_TABLET | Freq: Three times a day (TID) | ORAL | 0 refills | Status: DC | PRN

## 2017-03-22 MED ORDER — SODIUM CHLORIDE 0.9 % IV BOLUS (SEPSIS)
1000.0000 mL | Freq: Once | INTRAVENOUS | Status: AC
  Administered 2017-03-22: 1000 mL via INTRAVENOUS

## 2017-03-22 MED ORDER — ONDANSETRON HCL 4 MG/2ML IJ SOLN
4.0000 mg | Freq: Once | INTRAMUSCULAR | Status: AC
Start: 2017-03-22 — End: 2017-03-22
  Administered 2017-03-22: 4 mg via INTRAVENOUS
  Filled 2017-03-22: qty 2

## 2017-03-22 MED ORDER — DIAZEPAM 5 MG PO TABS
5.0000 mg | ORAL_TABLET | Freq: Once | ORAL | Status: AC
  Administered 2017-03-22: 5 mg via ORAL
  Filled 2017-03-22: qty 1

## 2017-03-22 MED ORDER — DIAZEPAM 5 MG PO TABS: 5.0000 mg | ORAL_TABLET | Freq: Three times a day (TID) | ORAL | 0 refills | Status: DC | PRN

## 2017-03-22 MED ORDER — MECLIZINE HCL 25 MG PO TABS
25.0000 mg | ORAL_TABLET | Freq: Once | ORAL | Status: AC
  Administered 2017-03-22: 25 mg via ORAL
  Filled 2017-03-22: qty 1

## 2017-03-22 NOTE — ED Provider Notes (Signed)
[facility] Emergency Department Provider Note  ____________________________________________  Time seen: Approximately 7:55 AM  I have reviewed the triage vital signs and the nursing notes.   HISTORY  Chief Complaint Dizziness   HPI Vanessa Higgins is a 66 y.o. female with a history of diabetes, hypertension, hyperlipidemia who presents for evaluation of vertigo. Patient was in her usual state of health yesterday evening when she went to bed. This morning when she got up she noticed a sensation of room spinning. She became diaphoretic and very nauseous. Had several episodes of nonbloody nonbilious emesis and continues to dry heave. She reports that the vertigo resolves when she stays still. She denies personal or family history of stroke, she is not a smoker, no personal history of arrhythmias. Patient denies facial droop, slurred speech, difficulty finding words, dysarthria, dysphasia, diplopia, unilateral weakness or numbness, palpitations, chest pain, shortness of breath, abdominal pain, fever or chills, headache, diarrhea. The patient does not have a history of vertigo.  Past Medical History:  Diagnosis Date  . Allergy    hay fever  . Ankle fracture, left 04/08/2014  . Arthritis   . Asthma   . Blood transfusion abn reaction or complication, no procedure mishap   . Diabetes mellitus   . GERD (gastroesophageal reflux disease)   . Headache(784.0)   . Hyperlipidemia   . Hypertension   . Migraine   . UTI (lower urinary tract infection)   . Wears glasses     Patient Active Problem List   Diagnosis Date Noted  . Elevated liver enzymes 12/03/2016  . Cough 06/05/2016  . Irregular heart rate 06/05/2016  . Routine general medical examination at a health care facility 05/03/2013  . Diabetes mellitus type 2, controlled ([place]) 07/09/2012  . Hypertension 07/09/2012  . Hyperlipidemia 07/09/2012  . Asthma 07/09/2012  . Obesity 07/09/2012    Past Surgical  History:  Procedure Laterality Date  . APPENDECTOMY  1977  . COLONOSCOPY    . ORIF ANKLE FRACTURE Left 04/08/2014   Procedure: OPEN REDUCTION INTERNAL FIXATION (ORIF) ANKLE FRACTURE LEFT ANKLE FRACTURE OPEN TREATMENT BIMALLEOLAR ANKLE INCLUDES INTERNAL FIXATION;  Surgeon: Johnny Bridge, MD;  Location: [facility];  Service: Orthopedics;  Laterality: Left;  . PAROTIDECTOMY  1980   left  . TONSILLECTOMY AND ADENOIDECTOMY     as a child  . 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 TUBAL LIGATION  05/1976    Prior to Admission medications   Medication Sig Start Date End Date Taking? Authorizing Provider  Albuterol Sulfate 108 (90 Base) MCG/ACT AEPB Take 2 puffs by mouth as needed.     Historical Provider, MD  aspirin 81 MG tablet Take 81 mg by mouth daily.    Historical Provider, MD  atorvastatin (LIPITOR) 10 MG tablet Take 10 mg by mouth every evening.     Historical Provider, MD  b complex vitamins tablet Take 1 tablet by mouth daily.    Historical Provider, MD  canagliflozin (INVOKANA) 300 MG TABS tablet Take 300 mg by mouth daily before breakfast.    Historical Provider, MD  diazepam (VALIUM) 5 MG tablet Take 1 tablet (5 mg total) by mouth every 8 (eight) hours as needed (vertigo). 03/22/17 03/22/18  Rudene Re, MD  Fluticasone-Salmeterol (ADVAIR DISKUS) 250-50 MCG/DOSE AEPB INHALE 1 PUFF INTO THE LUNGS 2 TIMES DAILY. 12/20/16   Burnard Hawthorne, FNP  glucose blood (BAYER CONTOUR NEXT TEST) test strip Use 2 (two) times daily.  05/17/15   Historical Provider, MD  Liraglutide (VICTOZA [place]) Inject 1.8 mg into the skin at bedtime.     Elease Etienne, MD  loratadine (CLARITIN) 10 MG tablet Take 10 mg by mouth daily.    Historical Provider, MD  losartan (COZAAR) 25 MG tablet Take 1 tablet (25 mg total) by mouth daily. 01/17/17   Burnard Hawthorne, FNP  meclizine (ANTIVERT) 25 MG tablet Take 1 tablet (25 mg total) by mouth 3 (three) times daily as needed for dizziness. 03/22/17    Rudene Re, MD  metFORMIN (GLUCOPHAGE) 1000 MG tablet Take 1 tablet (1,000 mg total) by mouth 2 (two) times daily with a meal. 08/19/13   Jackolyn Confer, MD  montelukast (SINGULAIR) 10 MG tablet TAKE 1 TABLET BY MOUTH AT BEDTIME. 06/18/16   Jackolyn Confer, MD  Multiple Vitamins-Minerals (CENTRUM SILVER PO) Take 1 tablet by mouth daily. Reported on 04/17/2016    Historical Provider, MD  Omega-3 Fatty Acids (FISH OIL) 1200 MG CAPS Take 1 capsule by mouth daily.    Historical Provider, MD  omeprazole (PRILOSEC) 20 MG capsule Take 1 capsule (20 mg total) by mouth daily. 10/21/16   Burnard Hawthorne, FNP  venlafaxine XR (EFFEXOR-XR) 150 MG 24 hr capsule TAKE 1 CAPSULE BY MOUTH DAILY WITH BREAKFAST. 09/24/16   Burnard Hawthorne, FNP  Verapamil HCl CR 300 MG CP24 TAKE 1 CAPSULE BY MOUTH ONCE DAILY 06/18/16   Jackolyn Confer, MD    Allergies Sulfa antibiotics and Ramipril  Family History  Problem Relation Age of Onset  . Sudden death Mother   . Cancer Mother 54    pancreatic  . Heart disease Father 68  . Sudden death Father   . Cancer Brother     colon  . Stroke Maternal Grandmother   . Heart disease Maternal Grandfather   . Cancer Sister     brain  . Breast cancer Neg Hx     Social History Social History  Substance Use Topics  . Smoking status: Never Smoker  . Smokeless tobacco: Never Used  . Alcohol use No    Review of Systems  Constitutional: Negative for fever. Eyes: Negative for visual changes. ENT: Negative for sore throat. Neck: No neck pain  Cardiovascular: Negative for chest pain. Respiratory: Negative for shortness of breath. Gastrointestinal: Negative for abdominal pain, diarrhea. + N/V Genitourinary: Negative for dysuria. Musculoskeletal: Negative for back pain. Skin: Negative for rash. Neurological: Negative for headaches, weakness or numbness. + vertigo Psych: No SI or HI  ____________________________________________   PHYSICAL EXAM:  VITAL  SIGNS: ED Triage Vitals [03/22/17 0738]  Enc Vitals Group     BP (!) 154/71     Pulse Rate 70     Resp 12     Temp      Temp src      SpO2 97 %     Weight      Height      Head Circumference      Peak Flow      Pain Score      Pain Loc      Pain Edu?      Excl. in [place]?     Constitutional: Alert and oriented, laying in bed with eyes closed.  HEENT:      Head: Normocephalic and atraumatic.         Eyes: Conjunctivae are normal. Sclera is non-icteric. EOMI. PERRL      Mouth/Throat: Mucous membranes are moist.  Neck: Supple with no signs of meningismus. Cardiovascular: Regular rate and rhythm. No murmurs, gallops, or rubs. 2+ symmetrical distal pulses are present in all extremities. No JVD. Respiratory: Normal respiratory effort. Lungs are clear to auscultation bilaterally. No wheezes, crackles, or rhonchi.  Gastrointestinal: Soft, non tender, and non distended with positive bowel sounds. No rebound or guarding. Genitourinary: No CVA tenderness. Musculoskeletal: Nontender with normal range of motion in all extremities. No edema, cyanosis, or erythema of extremities. Neurologic: Normal speech and language. A & O x3, PERRL, unilateral horizontal fatigable nystagmus at left end gaze, CN II-XII intact, motor testing reveals good tone and bulk throughout. There is no evidence of pronator drift or dysmetria. Muscle strength is 5/5 throughout. Deep tendon reflexes are 2+ throughout with downgoing toes. Sensory examination is intact. Gait deferred. Skin: Skin is warm, dry and intact. No rash noted. Psychiatric: Mood and affect are normal. Speech and behavior are normal.  ____________________________________________   LABS (all labs ordered are listed, but only abnormal results are displayed)  Labs Reviewed  CBC - Abnormal; Notable for the following:       Result Value   RDW 17.9 (*)    All other components within normal limits  BASIC METABOLIC PANEL - Abnormal; Notable for the  following:    Chloride 100 (*)    Glucose, Bld 215 (*)    All other components within normal limits  URINALYSIS, COMPLETE (UACMP) WITH MICROSCOPIC - Abnormal; Notable for the following:    Color, Urine YELLOW (*)    APPearance CLEAR (*)    Glucose, UA 50 (*)    Squamous Epithelial / LPF 0-5 (*)    All other components within normal limits  TROPONIN I   ____________________________________________  EKG  ED ECG REPORT I, Rudene Re, the attending physician, personally viewed and interpreted this ECG.  Normal sinus rhythm, rate of 60, normal intervals, normal axis, no ST elevations or depressions, T-wave inversions on anterior leads. No significant changes when compared to EKG from August 2017 __________________________________________  RADIOLOGY  Head CT: negative  MRI: Negative  ____________________________________________   PROCEDURES  Procedure(s) performed: None Procedures Critical Care performed:  None ____________________________________________   INITIAL IMPRESSION / ASSESSMENT AND PLAN / ED COURSE   66 y.o. female with a history of diabetes, hypertension, hyperlipidemia who presents for evaluation of sudden onset of vertigo which resolves at rest associated with nausea and vomiting. Patient is neurologically intact, normal vital signs, she has unilateral horizontal fatigable nystagmus to left end gaze only. We'll give Zofran, meclizine, and Valium for symptom relief. We'll give IV fluids. We'll check basic blood work. Based on patient's age and comorbidities will send patient for CT head to rule out acute stroke  Clinical Course as of Mar 22 1201  Sat Mar 22, 2017  1157 Patient returned to baseline no longer having vertigo after receiving meclizine and by mouth Valium. CT head and MRI were no acute findings. Patient is going be discharged home at this time. I discussed signs and symptoms of stroke with the family recommended that she returns if these develop.  Also discussed with her and her daughter that Valium is a sedating medication and she should be monitored after taking it.  [CV]    Clinical Course User Index [CV] Rudene Re, MD    Pertinent labs & imaging results that were available during my care of the patient were reviewed by me and considered in my medical decision making (see chart for details).  ____________________________________________   FINAL CLINICAL IMPRESSION(S) / ED DIAGNOSES  Final diagnoses:  Vertigo      NEW MEDICATIONS STARTED DURING THIS VISIT:  New Prescriptions   DIAZEPAM (VALIUM) 5 MG TABLET    Take 1 tablet (5 mg total) by mouth every 8 (eight) hours as needed (vertigo).   MECLIZINE (ANTIVERT) 25 MG TABLET    Take 1 tablet (25 mg total) by mouth 3 (three) times daily as needed for dizziness.     Note:  This document was prepared using Dragon voice recognition software and may include unintentional dictation errors.    Rudene Re, MD 03/22/17 1202

## 2017-03-22 NOTE — ED Notes (Signed)
Pt transferred to CT.

## 2017-03-22 NOTE — ED Triage Notes (Signed)
Patient to ER for c/o dizziness with vomiting since waking at 0615.

## 2017-03-22 NOTE — ED Notes (Signed)
Pt able to ambulate without difficulty. Denies dizziness, nausea, or pain.

## 2017-03-22 NOTE — ED Notes (Signed)
Pt transported to MRI 

## 2017-03-26 ENCOUNTER — Ambulatory Visit (INDEPENDENT_AMBULATORY_CARE_PROVIDER_SITE_OTHER): Payer: 59 | Admitting: Family

## 2017-03-26 ENCOUNTER — Encounter: Payer: Self-pay | Admitting: Family

## 2017-03-26 DIAGNOSIS — R42 Dizziness and giddiness: Secondary | ICD-10-CM | POA: Diagnosis not present

## 2017-03-26 NOTE — Assessment & Plan Note (Signed)
Pleased to see resolved at this time. Reviewed ED course with patient. Discussed vestibular rehab if vertigo recurs. Advised to use meclizine as needed ( versus scheduled) and to be very cautious with valium. Patient will let me know if episode recurs.

## 2017-03-26 NOTE — Patient Instructions (Signed)
pleausure seeing you.   Glad you are doing so much better.   Let me know if vertigo recurs and try to wean off the meclizine and get to a point of using as needed.  Be cautious with valium as can be sedating

## 2017-03-26 NOTE — Progress Notes (Signed)
Subjective:    Patient ID: Vanessa Higgins, female    DOB: 12/19/1951, 66 y.o.   MRN: [id number]  CC: Vanessa Higgins is a 66 y.o. female who presents today for follow up.   HPI: ED 3/24 vertigo. Was diaphoretic and nauseous at that time. She also had episodes of nonbloody nonbilious emesis. No facial droop, slurred speech, chest pain or history of vertigo. EKG showed normal sinus rhythm. She was given Zofran, meclizine, and Valium. Negative troponin.   I reviewed CT head and MRI which showed no acute findings. Atherosclerotic changes noted ( patient is on lipitor).   Feeling better today. Meclizine is helping. Describes episode of 'room spinning.' when got up one monring from bed one week ago. Very nausea. NO syncope, dizziness, chest pain, changes in hearing, ear pressure, tinnitus. Given epley's maneuvers in ED.         HISTORY:  Past Medical History:  Diagnosis Date  . Allergy    hay fever  . Ankle fracture, left 04/08/2014  . Arthritis   . Asthma   . Blood transfusion abn reaction or complication, no procedure mishap   . Diabetes mellitus   . GERD (gastroesophageal reflux disease)   . Headache(784.0)   . Hyperlipidemia   . Hypertension   . Migraine   . UTI (lower urinary tract infection)   . Wears glasses    Past Surgical History:  Procedure Laterality Date  . APPENDECTOMY  1977  . COLONOSCOPY    . ORIF ANKLE FRACTURE Left 04/08/2014   Procedure: OPEN REDUCTION INTERNAL FIXATION (ORIF) ANKLE FRACTURE LEFT ANKLE FRACTURE OPEN TREATMENT BIMALLEOLAR ANKLE INCLUDES INTERNAL FIXATION;  Surgeon: Johnny Bridge, MD;  Location: [facility];  Service: Orthopedics;  Laterality: Left;  . PAROTIDECTOMY  1980   left  . TONSILLECTOMY AND ADENOIDECTOMY     as a child  . 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 TUBAL LIGATION  05/1976   Family History  Problem Relation Age of Onset  . Sudden death Mother   . Cancer Mother 88    pancreatic  . Heart disease  Father 73  . Sudden death Father   . Cancer Brother     colon  . Stroke Maternal Grandmother   . Heart disease Maternal Grandfather   . Cancer Sister     brain  . Breast cancer Neg Hx     Allergies: Sulfa antibiotics and Ramipril Current Outpatient Prescriptions on File Prior to Visit  Medication Sig Dispense Refill  . Albuterol Sulfate 108 (90 Base) MCG/ACT AEPB Take 2 puffs by mouth as needed.     Marland Kitchen aspirin 81 MG tablet Take 81 mg by mouth daily.    Marland Kitchen atorvastatin (LIPITOR) 10 MG tablet Take 10 mg by mouth every evening.     Marland Kitchen b complex vitamins tablet Take 1 tablet by mouth daily.    . diazepam (VALIUM) 5 MG tablet Take 1 tablet (5 mg total) by mouth every 8 (eight) hours as needed (vertigo). 15 tablet 0  . Fluticasone-Salmeterol (ADVAIR DISKUS) 250-50 MCG/DOSE AEPB INHALE 1 PUFF INTO THE LUNGS 2 TIMES DAILY. 60 each 6  . glucose blood (BAYER CONTOUR NEXT TEST) test strip Use 2 (two) times daily.    . Liraglutide (VICTOZA [place]) Inject 1.8 mg into the skin at bedtime.     Marland Kitchen loratadine (CLARITIN) 10 MG tablet Take 10 mg by mouth daily.    Marland Kitchen losartan (COZAAR) 25 MG tablet Take 1  tablet (25 mg total) by mouth daily. 90 tablet 1  . meclizine (ANTIVERT) 25 MG tablet Take 1 tablet (25 mg total) by mouth 3 (three) times daily as needed for dizziness. 30 tablet 0  . metFORMIN (GLUCOPHAGE) 1000 MG tablet Take 1 tablet (1,000 mg total) by mouth 2 (two) times daily with a meal. 60 tablet 5  . montelukast (SINGULAIR) 10 MG tablet TAKE 1 TABLET BY MOUTH AT BEDTIME. 90 tablet 3  . Multiple Vitamins-Minerals (CENTRUM SILVER PO) Take 1 tablet by mouth daily. Reported on 04/17/2016    . Omega-3 Fatty Acids (FISH OIL) 1200 MG CAPS Take 1 capsule by mouth daily.    Marland Kitchen omeprazole (PRILOSEC) 20 MG capsule Take 1 capsule (20 mg total) by mouth daily. 90 capsule 3  . venlafaxine XR (EFFEXOR-XR) 150 MG 24 hr capsule TAKE 1 CAPSULE BY MOUTH DAILY WITH BREAKFAST. 90 capsule 3  . Verapamil HCl CR 300 MG CP24 TAKE  1 CAPSULE BY MOUTH ONCE DAILY 90 capsule 3   No current facility-administered medications on file prior to visit.     Social History  Substance Use Topics  . Smoking status: Never Smoker  . Smokeless tobacco: Never Used  . Alcohol use No    Review of Systems  Constitutional: Negative for chills and fever.  Respiratory: Negative for cough.   Cardiovascular: Negative for chest pain and palpitations.  Gastrointestinal: Negative for nausea and vomiting.      Objective:    BP 138/64   Pulse 71   Temp 98 F (36.7 C) (Oral)   Ht 5' 4.25" (1.632 m)   Wt 202 lb 3.2 oz (91.7 kg)   SpO2 94%   BMI 34.44 kg/m  BP Readings from Last 3 Encounters:  03/26/17 138/64  03/22/17 (!) 160/88  12/04/16 (!) 150/80   Wt Readings from Last 3 Encounters:  03/26/17 202 lb 3.2 oz (91.7 kg)  03/22/17 209 lb (94.8 kg)  12/04/16 209 lb (94.8 kg)    Physical Exam  Constitutional: She appears well-developed and well-nourished.  Eyes: Conjunctivae are normal.  Cardiovascular: Normal rate, regular rhythm, normal heart sounds and normal pulses.   Pulmonary/Chest: Effort normal and breath sounds normal. She has no wheezes. She has no rhonchi. She has no rales.  Neurological: She is alert.  Skin: Skin is warm and dry.  Psychiatric: She has a normal mood and affect. Her speech is normal and behavior is normal. Thought content normal.  Vitals reviewed.      Assessment & Plan:   Problem List Items Addressed This Visit      Other   Vertigo    Pleased to see resolved at this time. Reviewed ED course with patient. Discussed vestibular rehab if vertigo recurs. Advised to use meclizine as needed ( versus scheduled) and to be very cautious with valium. Patient will let me know if episode recurs.           I have discontinued Ms. Redler's canagliflozin. I am also having her maintain her Multiple Vitamins-Minerals (CENTRUM SILVER PO), aspirin, Fish Oil, b complex vitamins, loratadine, metFORMIN,  atorvastatin, glucose blood, Albuterol Sulfate, Verapamil HCl CR, montelukast, venlafaxine XR, omeprazole, Liraglutide (VICTOZA [place]), Fluticasone-Salmeterol, losartan, diazepam, and meclizine.   No orders of the defined types were placed in this encounter.   Return precautions given.   Risks, benefits, and alternatives of the medications and treatment plan prescribed today were discussed, and patient expressed understanding.   Education regarding symptom management and diagnosis given to patient  on AVS.  Continue to follow with Mable Paris, FNP for routine health maintenance.   [name] and I agreed with plan.   Mable Paris, FNP

## 2017-04-06 DIAGNOSIS — G4730 Sleep apnea, unspecified: Secondary | ICD-10-CM

## 2017-04-06 HISTORY — DX: Sleep apnea, unspecified: G47.30

## 2017-04-09 ENCOUNTER — Ambulatory Visit: Payer: 59 | Attending: Internal Medicine

## 2017-04-09 DIAGNOSIS — G4733 Obstructive sleep apnea (adult) (pediatric): Secondary | ICD-10-CM | POA: Diagnosis not present

## 2017-04-09 DIAGNOSIS — G4730 Sleep apnea, unspecified: Secondary | ICD-10-CM | POA: Diagnosis not present

## 2017-04-09 DIAGNOSIS — G4761 Periodic limb movement disorder: Secondary | ICD-10-CM | POA: Diagnosis not present

## 2017-04-16 ENCOUNTER — Other Ambulatory Visit: Payer: Self-pay | Admitting: Pharmacist

## 2017-04-16 ENCOUNTER — Encounter: Payer: Self-pay | Admitting: Pharmacist

## 2017-04-16 NOTE — Patient Outreach (Signed)
[facility]) Care Management  [place]   04/16/2017  ALLICE GARRO Nov 15, 1951 [id number]  Subjective:  Patient presents today for 3 month diabetes follow-up as part of the employer-sponsored [employer] [facility] program. Current diabetes regimen includes Victoza and metformin. Patient continues on daily aspirin, ARB and a statin. She is current with her dental and vision exams. Patient states she was recently diagnosed with sleep apnea and will be fitted for a CPAP. Recently seen in the emergency department for Vertigo, no current issues.  Objective:  A1c = 6.5% (02/13/17) TC = 134 mg/dl; TG = 102 mg/dl; HDL = 44 mg/dl; LDL = 69 mg/dl (09/19/16)  Vitals:   04/16/17 1006  BP: 110/64  Pulse: 76  Weight: 205 lb (93 kg)  Height: 1.651 m ('5\' 5"'$ )   Encounter Medications: Outpatient Encounter Prescriptions as of 04/16/2017  Medication Sig Note  . Albuterol Sulfate 108 (90 Base) MCG/ACT AEPB Take 2 puffs by mouth as needed.  06/05/2016: Received from: [place]  . aspirin 81 MG tablet Take 81 mg by mouth daily.   Marland Kitchen atorvastatin (LIPITOR) 10 MG tablet Take 10 mg by mouth every evening.    Marland Kitchen b complex vitamins tablet Take 1 tablet by mouth daily.   . Fluticasone-Salmeterol (ADVAIR DISKUS) 250-50 MCG/DOSE AEPB INHALE 1 PUFF INTO THE LUNGS 2 TIMES DAILY.   Marland Kitchen glucose blood (BAYER CONTOUR NEXT TEST) test strip Use 2 (two) times daily. 12/06/2015: Received from: [place]  . Liraglutide (VICTOZA [place]) Inject 1.8 mg into the skin at bedtime.    Marland Kitchen loratadine (CLARITIN) 10 MG tablet Take 10 mg by mouth daily.   Marland Kitchen losartan (COZAAR) 25 MG tablet Take 1 tablet (25 mg total) by mouth daily.   . metFORMIN (GLUCOPHAGE) 1000 MG tablet Take 1 tablet (1,000 mg total) by mouth 2 (two) times daily with a meal.   . montelukast (SINGULAIR) 10 MG tablet TAKE 1 TABLET BY MOUTH AT BEDTIME.   Marland Kitchen Omega-3 Fatty Acids (FISH OIL) 1200 MG CAPS Take 1 capsule by mouth  daily.   Marland Kitchen omeprazole (PRILOSEC) 20 MG capsule Take 1 capsule (20 mg total) by mouth daily.   Marland Kitchen venlafaxine XR (EFFEXOR-XR) 150 MG 24 hr capsule TAKE 1 CAPSULE BY MOUTH DAILY WITH BREAKFAST.   Marland Kitchen Verapamil HCl CR 300 MG CP24 TAKE 1 CAPSULE BY MOUTH ONCE DAILY   . diazepam (VALIUM) 5 MG tablet Take 1 tablet (5 mg total) by mouth every 8 (eight) hours as needed (vertigo). (Patient not taking: Reported on 04/16/2017)   . meclizine (ANTIVERT) 25 MG tablet Take 1 tablet (25 mg total) by mouth 3 (three) times daily as needed for dizziness. (Patient not taking: Reported on 04/16/2017)   . Multiple Vitamins-Minerals (CENTRUM SILVER PO) Take 1 tablet by mouth daily. Reported on 04/17/2016    No facility-administered encounter medications on file as of 04/16/2017.     Functional Status: In your present state of health, do you have any difficulty performing the following activities: 04/16/2017 12/04/2016  Hearing? N N  Vision? N N  Difficulty concentrating or making decisions? N N  Walking or climbing stairs? Y Y  Dressing or bathing? N N  Doing errands, shopping? N N  Some recent data might be hidden    Fall/Depression Screening: Fall Risk  04/16/2017 12/04/2016 09/19/2016  Falls in the past year? No Yes No  Number falls in past yr: - 2 or more -  Injury with Fall? -  No -   PHQ 2/9 Scores 04/16/2017 12/04/2016 09/19/2016 08/28/2016 06/05/2016 04/17/2016 01/17/2016  PHQ - 2 Score 0 0 0 0 0 0 0    [facility] CM Care Plan Problem One     Most Recent Value  Care Plan Problem One  Maintain A1c at goal of less than 7%  Role Documenting the Problem One  Clinical Pharmacist  Care Plan for Problem One  Active  [facility] Long Term Goal (31-90 days)  Patient will maintain A1c below 7% over the next 90 days,  evidenced by physician or patient report  [place] Goal Start Date  04/16/17  [facility] Long Term Goal Met Date  02/13/17 [A1c goal of less than 7% met]  Interventions for Problem One Long Term Goal  Continue exercising as  tolerated 3 days per week. Continue to attend physician and pharmacy appointments. Continue current medication regimen and adherence. Continue to watch serving/portion sizes.       Assessment: Diabetes: A1c within goal of less than 7%. Checks blood sugar twice daily. Fasting blood sugar values are typically less than 150 mg/dl. Invokana was discontinued due to continued UTI symptoms and concern for amputation risk. Jardiance was added to use as needed for hyperglycemia; she has not had to use this medication. Patient has lost about 16 pounds over the last few month. She is watching her portion sizes and limiting carbohydrates. Hypertension: Within goal of less than 140/90 mmHg. Patient states recent cardiac workup was negative except for an occasional "irregular heart rate". She is currently on losartan and verapamil (migraine prophylaxis). Heart rate 76 bpm, regular rate and rhythm today. Cholesterol: HDL not within goal of > 50 mg/dl; all other values within goal. Exercise: Able to exercise some post 3rd Synvisc injection last year. However, she is not currently exercising due to schedule. Encouraged patient to start back with the walking when she is able to.  Plan: Follow-up phone call from pharmacist scheduled for July to check status of [facility] program. May start second enrollment. Annual eye exam scheduled for 04/2017 Will schedule 6 month LTW visit once [facility] status determined in July.  Clementine Soulliere K. Dicky Doe, PharmD [facility] [phone]

## 2017-04-17 ENCOUNTER — Telehealth: Payer: Self-pay | Admitting: Family

## 2017-04-17 DIAGNOSIS — G4733 Obstructive sleep apnea (adult) (pediatric): Secondary | ICD-10-CM

## 2017-04-17 NOTE — Telephone Encounter (Signed)
Pt called requesting sleep study results. Please advise, thank you!  Call pt @ [phone]

## 2017-04-18 NOTE — Telephone Encounter (Signed)
Results have been printed and placed in blue folder for interpretation

## 2017-04-21 DIAGNOSIS — G4733 Obstructive sleep apnea (adult) (pediatric): Secondary | ICD-10-CM | POA: Insufficient documentation

## 2017-04-21 NOTE — Telephone Encounter (Signed)
[place]  Call pt-  Sleep study shows OSA.   Advise to have sleep study with cpap titration- I have placed referral for this,     Melissa,  see referral for cipap titration. Let me know if I need to order another way

## 2017-04-21 NOTE — Telephone Encounter (Signed)
Left message for patient to return call back.  

## 2017-04-22 NOTE — Telephone Encounter (Signed)
Left message for patient to return call back.  

## 2017-04-22 NOTE — Telephone Encounter (Signed)
Patient advised of results and verbalized understanding.  

## 2017-04-24 ENCOUNTER — Ambulatory Visit
Admission: RE | Admit: 2017-04-24 | Discharge: 2017-04-24 | Disposition: A | Discharge status: Home / Self Care | Payer: 59 | Source: Ambulatory Visit | Attending: Family | Admitting: Family

## 2017-04-24 DIAGNOSIS — Z1231 Encounter for screening mammogram for malignant neoplasm of breast: Secondary | ICD-10-CM | POA: Diagnosis not present

## 2017-05-08 DIAGNOSIS — E119 Type 2 diabetes mellitus without complications: Secondary | ICD-10-CM | POA: Diagnosis not present

## 2017-05-08 LAB — HM DIABETES EYE EXAM

## 2017-05-14 ENCOUNTER — Ambulatory Visit: Payer: 59 | Attending: Neurology

## 2017-05-14 DIAGNOSIS — G4733 Obstructive sleep apnea (adult) (pediatric): Secondary | ICD-10-CM | POA: Diagnosis not present

## 2017-06-02 ENCOUNTER — Telehealth: Payer: Self-pay | Admitting: Family

## 2017-06-02 NOTE — Telephone Encounter (Signed)
[facility] correct?

## 2017-06-02 NOTE — Telephone Encounter (Signed)
I think that is right.   Do you have phone number perhaps for her? Or does Melissa have ?

## 2017-06-02 NOTE — Telephone Encounter (Signed)
Pt called and stated that she was fitted for her cpap machine 2 weeks ago and hasn't heard anything back and they advise her to call her pcp, to find out where to go. Please advise, thank you!  Call pt @ [phone]

## 2017-06-03 NOTE — Telephone Encounter (Signed)
Patient was notified to call [facility] and if they have not received the order from sleep study then to have them re fax over rx.

## 2017-06-10 DIAGNOSIS — G4733 Obstructive sleep apnea (adult) (pediatric): Secondary | ICD-10-CM | POA: Diagnosis not present

## 2017-06-11 ENCOUNTER — Ambulatory Visit: Payer: 59 | Admitting: Internal Medicine

## 2017-07-03 ENCOUNTER — Other Ambulatory Visit: Payer: Self-pay | Admitting: Family

## 2017-07-03 MED ORDER — MONTELUKAST SODIUM 10 MG PO TABS: 10.0000 mg | ORAL_TABLET | Freq: Every day | ORAL | 3 refills | Status: DC

## 2017-07-10 DIAGNOSIS — G4733 Obstructive sleep apnea (adult) (pediatric): Secondary | ICD-10-CM | POA: Diagnosis not present

## 2017-07-16 ENCOUNTER — Ambulatory Visit: Payer: Self-pay | Admitting: Pharmacist

## 2017-07-30 ENCOUNTER — Encounter: Payer: Self-pay | Admitting: Internal Medicine

## 2017-07-30 ENCOUNTER — Ambulatory Visit (INDEPENDENT_AMBULATORY_CARE_PROVIDER_SITE_OTHER): Payer: 59 | Admitting: Internal Medicine

## 2017-07-30 VITALS — BP 120/68 | HR 75 | Ht 64.5 in | Wt 204.5 lb

## 2017-07-30 DIAGNOSIS — I10 Essential (primary) hypertension: Secondary | ICD-10-CM

## 2017-07-30 DIAGNOSIS — I493 Ventricular premature depolarization: Secondary | ICD-10-CM | POA: Diagnosis not present

## 2017-07-30 NOTE — Progress Notes (Signed)
Follow-up Outpatient Visit Date: 07/30/2017  Primary Care Provider: Burnard Hawthorne, FNP [address] [place] [address]  Chief Complaint: palpitations  HPI:  Vanessa Higgins is a 66 y.o. year-old female with history of frequent PVCs, hypertension, hyperlipidemia, diabetes mellitus, asthma, obstructive sleep apnea, and GERD, who presents for follow-up of PVCs. She is. 6 seen in our office by Dr. Yvone Neu, most recently 09/2016. Previous workup revealed frequent PVCs (11% PVC burden), most of which were asymptomatic. Echo and stress test last year showed preserved LVEF without evidence of ischemia. Today, Vanessa Higgins reports feeling well. She has not had any chest pain, shortness of breath, palpitations, lightheadedness, or edema. She underwent a sleep study after her last visit and started using CPAP about 6 weeks ago. She has not noticed much improvement in her fatigue since beginning CPAP. She notes that her home blood pressures are typically in the 130s over 70s. She remains on verapamil, which she has taken for years to prophylax against migraine headaches. She had one episode of vertigo this spring for which was treated with diazepam and meclizine. This has not recurred.  --------------------------------------------------------------------------------------------------  Cardiovascular History & Procedures: Cardiovascular Problems:  Frequent PVCs  Risk Factors:  Hypertension, hyperlipidemia, diabetes mellitus, obesity, and age greater than 23  Cath/PCI:  None  CV Surgery:  None  EP Procedures and Devices:  Holter monitor (09/19/16): Predominantly sinus rhythm with rare PACs and frequent PVCs (11% burden). Single 5 beat atrial run.  Non-Invasive Evaluation(s):  TTE (09/19/16): Normal LV size with an EF of 60-65%. Normal wall motion and diastolic function. Normal RV size and function. No significant valvular abnormalities.  Pharmacologic MPI (09/06/16): Low risk study  without ischemia. LVEF 55%.  Recent CV Pertinent Labs: Lab Results  Component Value Date   CHOL 134 09/19/2016   CHOL 102 06/28/2013   HDL 44.00 09/19/2016   HDL 44 06/28/2013   LDLCALC 69 09/19/2016   LDLCALC 42 06/28/2013   TRIG 102.0 09/19/2016   TRIG 81 06/28/2013   CHOLHDL 3 09/19/2016   K 3.9 03/22/2017   K 4.2 12/28/2012   BUN 11 03/22/2017   BUN 16 12/28/2012   CREATININE 0.70 03/22/2017   CREATININE 0.57 12/02/2014    Past medical and surgical history were reviewed and updated in EPIC.  Current Meds  Medication Sig  . Albuterol Sulfate 108 (90 Base) MCG/ACT AEPB Take 2 puffs by mouth as needed.   Marland Kitchen aspirin 81 MG tablet Take 81 mg by mouth daily.  Marland Kitchen atorvastatin (LIPITOR) 10 MG tablet Take 10 mg by mouth every evening.   Marland Kitchen b complex vitamins tablet Take 1 tablet by mouth daily.  . diazepam (VALIUM) 5 MG tablet Take 1 tablet (5 mg total) by mouth every 8 (eight) hours as needed (vertigo).  . Fluticasone-Salmeterol (ADVAIR DISKUS) 250-50 MCG/DOSE AEPB INHALE 1 PUFF INTO THE LUNGS 2 TIMES DAILY.  Marland Kitchen glucose blood (BAYER CONTOUR NEXT TEST) test strip Use 2 (two) times daily.  . Liraglutide (VICTOZA [place]) Inject 1.8 mg into the skin at bedtime.   . liraglutide (VICTOZA) 18 MG/3ML SOPN INJECT 0.3 MLS SUBCUTANEOUSLY ONCE DAILY AT BEDTIME  . loratadine (CLARITIN) 10 MG tablet Take 10 mg by mouth daily.  Marland Kitchen losartan (COZAAR) 25 MG tablet TAKE 1 TABLET BY MOUTH DAILY  . meclizine (ANTIVERT) 25 MG tablet Take 1 tablet (25 mg total) by mouth 3 (three) times daily as needed for dizziness.  . metFORMIN (GLUCOPHAGE) 1000 MG tablet Take 1 tablet (  1,000 mg total) by mouth 2 (two) times daily with a meal.  . montelukast (SINGULAIR) 10 MG tablet Take 1 tablet (10 mg total) by mouth at bedtime.  . Multiple Vitamins-Minerals (CENTRUM SILVER PO) Take 1 tablet by mouth daily. Reported on 04/17/2016  . Omega-3 Fatty Acids (FISH OIL) 1200 MG CAPS Take 1 capsule by mouth daily.  Marland Kitchen omeprazole  (PRILOSEC) 20 MG capsule Take 1 capsule (20 mg total) by mouth daily.  Marland Kitchen venlafaxine XR (EFFEXOR-XR) 150 MG 24 hr capsule TAKE 1 CAPSULE BY MOUTH DAILY WITH BREAKFAST.  Marland Kitchen Verapamil HCl CR 300 MG CP24 TAKE 1 CAPSULE BY MOUTH ONCE DAILY    Allergies: Sulfa antibiotics and Ramipril  Social History   Social History  . Marital status: Widowed    Spouse name: N/A  . Number of children: N/A  . Years of education: N/A   Occupational History  . Not on file.   Social History Main Topics  . Smoking status: Never Smoker  . Smokeless tobacco: Never Used  . Alcohol use No  . Drug use: No  . Sexual activity: Not on file   Other Topics Concern  . Not on file   Social History Narrative   Lives in [place] with daughter and twin grandchildren 9YO. Husband deceased 2008-04-17.      Work - [employer]   Diet - healthy   Exercise - gym 2 x per week    Family History  Problem Relation Age of Onset  . Sudden death Mother   . Cancer Mother 62       pancreatic  . Heart disease Father 5  . Sudden death Father   . Cancer Brother        colon  . Stroke Maternal Grandmother   . Heart disease Maternal Grandfather   . Cancer Sister        brain  . Breast cancer Neg Hx     Review of Systems: A 12-system review of systems was performed and was negative except as noted in the HPI.  --------------------------------------------------------------------------------------------------  Physical Exam: BP 120/68 (BP Location: Left Arm, Patient Position: Sitting, Cuff Size: Normal)   Pulse 75   Ht 5' 4.5" (1.638 m)   Wt 204 lb 8 oz (92.8 kg)   BMI 34.56 kg/m   General:  Obese woman, seated comfortably in the exam room. HEENT: No conjunctival pallor or scleral icterus. Moist mucous membranes.  OP clear. Neck: Supple without lymphadenopathy, thyromegaly, JVD, or HJR. No carotid bruit. Lungs: Normal work of breathing. Clear to auscultation bilaterally without wheezes or crackles. Heart: Regular  rate and rhythm without murmurs, rubs, or gallops. Non-displaced PMI. Abd: Bowel sounds present. Soft, NT/ND without hepatosplenomegaly Ext: No lower extremity edema. Radial, PT, and DP pulses are 2+ bilaterally. Skin: Warm and dry without rash.  EKG:  Normal sinus rhythm without significant abnormalities.  Lab Results  Component Value Date   WBC 6.6 03/22/2017   HGB 12.6 03/22/2017   HCT 38.5 03/22/2017   MCV 86.6 03/22/2017   PLT 304 03/22/2017    Lab Results  Component Value Date   NA 136 03/22/2017   K 3.9 03/22/2017   CL 100 (L) 03/22/2017   CO2 26 03/22/2017   BUN 11 03/22/2017   CREATININE 0.70 03/22/2017   GLUCOSE 215 (H) 03/22/2017   ALT 39 (H) 01/08/2017    Lab Results  Component Value Date   CHOL 134 09/19/2016   HDL 44.00 09/19/2016   LDLCALC 69 09/19/2016  TRIG 102.0 09/19/2016   CHOLHDL 3 09/19/2016    --------------------------------------------------------------------------------------------------  ASSESSMENT AND PLAN: Frequent PVCs Vanessa Higgins has been asymptomatic. EKG today demonstrates sinus rhythm without significant ectopy. Prior workup, including TTE and stress test were unrevealing. She is currently on verapamil for management of hypertension and migraine headaches. We discussed potential for switching her to a beta blocker. However, given lack of symptoms and use of verapamil for migraine headaches, will not make any medication changes today.  Hypertension Blood pressure is normal today. No changes at this time. I encouraged Vanessa Higgins to continue wearing CPAP.  Follow-up: Return to clinic in 6 months.  [name], MD 07/30/2017 11:22 AM

## 2017-07-30 NOTE — Patient Instructions (Signed)

## 2017-08-01 ENCOUNTER — Encounter: Payer: Self-pay | Admitting: Internal Medicine

## 2017-08-01 DIAGNOSIS — I493 Ventricular premature depolarization: Secondary | ICD-10-CM | POA: Insufficient documentation

## 2017-08-08 ENCOUNTER — Telehealth: Payer: Self-pay | Admitting: Family

## 2017-08-08 NOTE — Telephone Encounter (Signed)
Refill request for Verapamil, last seen [id number], last filled [id number].  Please advise.

## 2017-08-08 NOTE — Telephone Encounter (Signed)
Pt called needing a refill for Verapamil HCl CR 300 MG CP24.   Pharmacy is [facility], [address]  Call pt @ [phone]. Thank you!

## 2017-08-10 DIAGNOSIS — G4733 Obstructive sleep apnea (adult) (pediatric): Secondary | ICD-10-CM | POA: Diagnosis not present

## 2017-08-11 ENCOUNTER — Other Ambulatory Visit: Payer: Self-pay

## 2017-08-11 MED ORDER — VERAPAMIL HCL ER 300 MG PO CP24: 1.0000 | ORAL_CAPSULE | Freq: Every day | ORAL | 3 refills | Status: DC

## 2017-08-11 NOTE — Telephone Encounter (Signed)
Gave you paper rx as epic will not let me send as in future encounter  Please let pt know

## 2017-08-11 NOTE — Telephone Encounter (Signed)
Medication has faxed to [facility].

## 2017-08-11 NOTE — Telephone Encounter (Signed)
Medication has been refilled.

## 2017-08-13 DIAGNOSIS — E119 Type 2 diabetes mellitus without complications: Secondary | ICD-10-CM | POA: Diagnosis not present

## 2017-08-14 DIAGNOSIS — E785 Hyperlipidemia, unspecified: Secondary | ICD-10-CM | POA: Diagnosis not present

## 2017-08-14 DIAGNOSIS — E669 Obesity, unspecified: Secondary | ICD-10-CM | POA: Diagnosis not present

## 2017-08-14 DIAGNOSIS — E119 Type 2 diabetes mellitus without complications: Secondary | ICD-10-CM | POA: Diagnosis not present

## 2017-08-14 DIAGNOSIS — I10 Essential (primary) hypertension: Secondary | ICD-10-CM | POA: Diagnosis not present

## 2017-09-08 ENCOUNTER — Ambulatory Visit: Payer: 59 | Admitting: Family

## 2017-09-08 VITALS — BP 120/80 | HR 78 | Temp 98.7°F

## 2017-09-08 DIAGNOSIS — N3090 Cystitis, unspecified without hematuria: Secondary | ICD-10-CM

## 2017-09-08 DIAGNOSIS — R300 Dysuria: Secondary | ICD-10-CM

## 2017-09-08 LAB — POCT URINALYSIS DIPSTICK
Bilirubin, UA: NEGATIVE
Blood, UA: NEGATIVE
Ketones, UA: NEGATIVE
Leukocytes, UA: NEGATIVE
Nitrite, UA: POSITIVE
Protein, UA: NEGATIVE
Spec Grav, UA: 1.025 (ref 1.010–1.025)
Urobilinogen, UA: 0.2 E.U./dL
pH, UA: 5.5 (ref 5.0–8.0)

## 2017-09-08 MED ORDER — CIPROFLOXACIN HCL 250 MG PO TABS: 250.0000 mg | ORAL_TABLET | Freq: Two times a day (BID) | ORAL | 0 refills | Status: DC

## 2017-09-08 NOTE — Progress Notes (Signed)
S/: 1 week history of dysuria ,frequency ,urgency not responding to hydrate, was preceded by a few days of loose stools, denies fever nausea,vomiting or toxic symptoms; blood sugars around 120 fasting O/: alert, pleasant, NAD, nontoxic heart regular sinus rhythm, lungs are clear, abdomen: Suprapubic tenderness  urinalysis positive for nitrites  A/: UTI  P/: Rx Cipro 250 mg 1 twice a day 3 days ,hydration encouraged, follow-up when necessary not improving

## 2017-09-10 DIAGNOSIS — G4733 Obstructive sleep apnea (adult) (pediatric): Secondary | ICD-10-CM | POA: Diagnosis not present

## 2017-09-15 ENCOUNTER — Other Ambulatory Visit: Payer: Self-pay | Admitting: Family

## 2017-09-19 DIAGNOSIS — G4733 Obstructive sleep apnea (adult) (pediatric): Secondary | ICD-10-CM | POA: Diagnosis not present

## 2017-10-03 ENCOUNTER — Other Ambulatory Visit: Payer: Self-pay | Admitting: Family

## 2017-10-15 ENCOUNTER — Ambulatory Visit (INDEPENDENT_AMBULATORY_CARE_PROVIDER_SITE_OTHER): Payer: 59 | Admitting: Family

## 2017-10-15 ENCOUNTER — Encounter: Payer: Self-pay | Admitting: Pharmacist

## 2017-10-15 ENCOUNTER — Other Ambulatory Visit: Payer: Self-pay | Admitting: Pharmacist

## 2017-10-15 ENCOUNTER — Encounter: Payer: Self-pay | Admitting: Family

## 2017-10-15 VITALS — BP 112/74 | HR 74 | Temp 98.0°F | Wt 201.2 lb

## 2017-10-15 DIAGNOSIS — I10 Essential (primary) hypertension: Secondary | ICD-10-CM

## 2017-10-15 DIAGNOSIS — G4733 Obstructive sleep apnea (adult) (pediatric): Secondary | ICD-10-CM

## 2017-10-15 DIAGNOSIS — K219 Gastro-esophageal reflux disease without esophagitis: Secondary | ICD-10-CM | POA: Diagnosis not present

## 2017-10-15 DIAGNOSIS — K760 Fatty (change of) liver, not elsewhere classified: Secondary | ICD-10-CM

## 2017-10-15 DIAGNOSIS — R42 Dizziness and giddiness: Secondary | ICD-10-CM

## 2017-10-15 DIAGNOSIS — E119 Type 2 diabetes mellitus without complications: Secondary | ICD-10-CM

## 2017-10-15 LAB — COMPREHENSIVE METABOLIC PANEL
ALT: 29 U/L (ref 0–35)
AST: 37 U/L (ref 0–37)
Albumin: 4.3 g/dL (ref 3.5–5.2)
Alkaline Phosphatase: 43 U/L (ref 39–117)
BUN: 14 mg/dL (ref 6–23)
CO2: 27 mEq/L (ref 19–32)
Calcium: 10 mg/dL (ref 8.4–10.5)
Chloride: 102 mEq/L (ref 96–112)
Creatinine, Ser: 0.55 mg/dL (ref 0.40–1.20)
GFR: 116.76 mL/min (ref >60.00)
Glucose, Bld: 108 mg/dL — ABNORMAL HIGH (ref 70–99)
Potassium: 4.3 mEq/L (ref 3.5–5.1)
Sodium: 138 mEq/L (ref 135–145)
Total Bilirubin: 1.1 mg/dL (ref 0.2–1.2)
Total Protein: 7.6 g/dL (ref 6.0–8.3)

## 2017-10-15 LAB — LIPID PANEL
Cholesterol: 116 mg/dL (ref 0–200)
HDL: 43.9 mg/dL (ref >39.00)
LDL Cholesterol: 52 mg/dL (ref 0–99)
NonHDL: 71.84
Total CHOL/HDL Ratio: 3
Triglycerides: 99 mg/dL (ref 0.0–149.0)
VLDL: 19.8 mg/dL (ref 0.0–40.0)

## 2017-10-15 NOTE — Assessment & Plan Note (Signed)
Chronic. Stable on PPI. Discussed long-term risk of PPI. Education provided. patient will trial of Zantac

## 2017-10-15 NOTE — Progress Notes (Signed)
Subjective:    Patient ID: Vanessa Higgins, female    DOB: 04/19/1951, 66 y.o.   MRN: [id number]  CC: Vanessa Higgins is a 66 y.o. female who presents today for follow up.   HPI: OSA- compliant with cipap  HTN- compliant with medication. No recent palpitations.  Denies exertional chest pain or pressure, numbness or tingling radiating to left arm or jaw, palpitations, dizziness, frequent headaches, changes in vision, or shortness of breath.   Hasn't had any 'spells' like did in spring with vertigo. Had 'spell' when rolling over to get OOB this weekend which resolved with meclizine.  No n, v, vision changes.   Migraines- none recently. On verapamil.   Depression - on effexor. No thoughts of hurting herself or anyone else.   GERD- controlled on prilosec. Last egd 2015 showed gastritis.     DM 2- Dr Eddie Dibbles PVCs- Dr End- on verapamil for HTN and migraine. Echo 2017 , holter 2017   HISTORY:  Past Medical History:  Diagnosis Date  . Allergy    hay fever  . Ankle fracture, left 04/08/2014  . Arthritis   . Asthma   . Blood transfusion abn reaction or complication, no procedure mishap   . Diabetes mellitus   . GERD (gastroesophageal reflux disease)   . Headache(784.0)   . Hyperlipidemia   . Hypertension   . Migraine   . Obstructive sleep apnea    CPAP  . Sleep apnea 04/06/2017  . UTI (lower urinary tract infection)   . Wears glasses    Past Surgical History:  Procedure Laterality Date  . APPENDECTOMY  1977  . COLONOSCOPY    . ORIF ANKLE FRACTURE Left 04/08/2014   Procedure: OPEN REDUCTION INTERNAL FIXATION (ORIF) ANKLE FRACTURE LEFT ANKLE FRACTURE OPEN TREATMENT BIMALLEOLAR ANKLE INCLUDES INTERNAL FIXATION;  Surgeon: Johnny Bridge, MD;  Location: [facility];  Service: Orthopedics;  Laterality: Left;  . PAROTIDECTOMY  1980   left  . TONSILLECTOMY AND ADENOIDECTOMY     as a child  . 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 TUBAL LIGATION  05/1976   Family  History  Problem Relation Age of Onset  . Sudden death Mother   . Cancer Mother 62       pancreatic  . Heart disease Father 30  . Sudden death Father   . Cancer Brother        colon  . Stroke Maternal Grandmother   . Heart disease Maternal Grandfather   . Cancer Sister        brain  . Breast cancer Neg Hx     Allergies: Sulfa antibiotics and Ramipril Current Outpatient Prescriptions on File Prior to Visit  Medication Sig Dispense Refill  . Albuterol Sulfate 108 (90 Base) MCG/ACT AEPB Take 2 puffs by mouth as needed.     Marland Kitchen aspirin 81 MG tablet Take 81 mg by mouth daily.    Marland Kitchen atorvastatin (LIPITOR) 10 MG tablet Take 10 mg by mouth every evening.     Marland Kitchen b complex vitamins tablet Take 1 tablet by mouth daily.    . diazepam (VALIUM) 5 MG tablet Take 1 tablet (5 mg total) by mouth every 8 (eight) hours as needed (vertigo). (Patient not taking: Reported on 10/15/2017) 15 tablet 0  . Fluticasone-Salmeterol (ADVAIR DISKUS) 250-50 MCG/DOSE AEPB INHALE 1 PUFF INTO THE LUNGS 2 TIMES DAILY. 60 each 6  . glucose blood (BAYER CONTOUR NEXT TEST) test strip Use 2 (two) times  daily.    Marland Kitchen JARDIANCE 10 MG TABS tablet Take 10 mg by mouth daily.   5  . liraglutide (VICTOZA) 18 MG/3ML SOPN 1.8 mg daily    . loratadine (CLARITIN) 10 MG tablet Take 10 mg by mouth daily.    Marland Kitchen losartan (COZAAR) 25 MG tablet TAKE 1 TABLET BY MOUTH DAILY 90 tablet 1  . meclizine (ANTIVERT) 25 MG tablet Take 1 tablet (25 mg total) by mouth 3 (three) times daily as needed for dizziness. (Patient taking differently: Take 25 mg by mouth 3 (three) times daily as needed for dizziness. Has only used this once since first episode) 30 tablet 0  . metFORMIN (GLUCOPHAGE) 1000 MG tablet Take 1 tablet (1,000 mg total) by mouth 2 (two) times daily with a meal. 60 tablet 5  . montelukast (SINGULAIR) 10 MG tablet Take 1 tablet (10 mg total) by mouth at bedtime. 90 tablet 3  . Multiple Vitamins-Minerals (CENTRUM SILVER PO) Take 1 tablet by mouth  daily. Reported on 04/17/2016    . Omega-3 Fatty Acids (FISH OIL) 1200 MG CAPS Take 1 capsule by mouth daily.    Marland Kitchen omeprazole (PRILOSEC) 20 MG capsule TAKE 1 CAPSULE (20 MG TOTAL) BY MOUTH DAILY. 90 capsule 3  . venlafaxine XR (EFFEXOR-XR) 150 MG 24 hr capsule TAKE 1 CAPSULE BY MOUTH DAILY WITH BREAKFAST. 90 capsule 0  . Verapamil HCl CR 300 MG CP24 Take 1 capsule (300 mg total) by mouth daily. 90 capsule 3   No current facility-administered medications on file prior to visit.     Social History  Substance Use Topics  . Smoking status: Never Smoker  . Smokeless tobacco: Never Used  . Alcohol use No    Review of Systems  Constitutional: Negative for chills and fever.  Respiratory: Negative for cough.   Cardiovascular: Negative for chest pain and palpitations.  Gastrointestinal: Negative for nausea and vomiting.  Neurological: Negative for dizziness and headaches.      Objective:    BP 112/74 (BP Location: Left Arm, Patient Position: Sitting, Cuff Size: Large)   Pulse 74   Temp 98 F (36.7 C) (Oral)   Wt 201 lb 4 oz (91.3 kg)   SpO2 94%   BMI 34.01 kg/m  BP Readings from Last 3 Encounters:  10/15/17 112/74  09/08/17 120/80  07/30/17 120/68   Wt Readings from Last 3 Encounters:  10/15/17 201 lb (91.2 kg)  10/15/17 201 lb 4 oz (91.3 kg)  07/30/17 204 lb 8 oz (92.8 kg)    Physical Exam  Constitutional: She appears well-developed and well-nourished.  Eyes: Conjunctivae are normal.  Cardiovascular: Normal rate, regular rhythm, normal heart sounds and normal pulses.   Pulmonary/Chest: Effort normal and breath sounds normal. She has no wheezes. She has no rhonchi. She has no rales.  Neurological: She is alert.  Skin: Skin is warm and dry.  Psychiatric: She has a normal mood and affect. Her speech is normal and behavior is normal. Thought content normal.  Vitals reviewed.      Assessment & Plan:   Problem List Items Addressed This Visit      Cardiovascular and  Mediastinum   Hypertension    Well-controlled. Continue current regimen        Respiratory   OSA (obstructive sleep apnea)    compliant.        Digestive   Fatty liver disease, nonalcoholic - Primary    Pending liver enzymes. In the context of diabetes, patient and I jointly agreed  consult with GI for further surveillance.      Relevant Orders   Comprehensive metabolic panel (Completed)   Lipid panel (Completed)   Ambulatory referral to Gastroenterology   GERD (gastroesophageal reflux disease)    Chronic. Stable on PPI. Discussed long-term risk of PPI. Education provided. patient will trial of Zantac        Endocrine   Diabetes mellitus type 2, controlled ([place])    Follows with endocrine.        Other   Vertigo    No severe episodes like she had in spring.  Controlled on prn meclizine. Patient will let me know symptom worsens, new symptoms develop, or becomes more frequent.          I have discontinued Ms. Boettner's Liraglutide (VICTOZA Lovilia) and ciprofloxacin. I am also having her maintain her Multiple Vitamins-Minerals (CENTRUM SILVER PO), aspirin, Fish Oil, b complex vitamins, loratadine, metFORMIN, atorvastatin, glucose blood, Albuterol Sulfate, Fluticasone-Salmeterol, diazepam, meclizine, losartan, montelukast, liraglutide, Verapamil HCl CR, JARDIANCE, venlafaxine XR, and omeprazole.   No orders of the defined types were placed in this encounter.   Return precautions given.   Risks, benefits, and alternatives of the medications and treatment plan prescribed today were discussed, and patient expressed understanding.   Education regarding symptom management and diagnosis given to patient on AVS.  Continue to follow with Burnard Hawthorne, FNP for routine health maintenance.   [name] and I agreed with plan.   Mable Paris, FNP

## 2017-10-15 NOTE — Assessment & Plan Note (Signed)
Pending liver enzymes. In the context of diabetes, patient and I jointly agreed consult with GI for further surveillance.

## 2017-10-15 NOTE — Assessment & Plan Note (Signed)
compliant

## 2017-10-15 NOTE — Assessment & Plan Note (Signed)
Well-controlled.  Continue current regimen. 

## 2017-10-15 NOTE — Assessment & Plan Note (Signed)
Follows with endocrine

## 2017-10-15 NOTE — Patient Instructions (Addendum)
Long term use beyond 3 months of proton pump inhibitors , also called PPI's, is associated with malabsorption of vitamins, chronic kidney disease, fracture risk, and diarrheal illnesses. PPI's include Nexium, Prilosec, Protonix, Dexilant, and Prevacid.   I generally recommend trying to control acid reflux with lifestyle modifications including avoiding trigger foods, not eating 2 hours prior to bedtime. You may use histamine 2 blockers daily to twice daily ( this is Zantac, Pepcid) and then when symptoms flare, start back on PPI for short course.    Labs today  Pleasure seeing you!   Nonalcoholic Fatty Liver Disease Diet Introduction Nonalcoholic fatty liver disease is a condition that causes fat to accumulate in and around the liver. The disease makes it harder for the liver to work the way that it should. Following a healthy diet can help to keep nonalcoholic fatty liver disease under control. It can also help to prevent or improve conditions that are associated with the disease, such as heart disease, diabetes, high blood pressure, and abnormal cholesterol levels. Along with regular exercise, this diet:  Promotes weight loss.  Helps to control blood sugar levels.  Helps to improve the way that the body uses insulin. What do I need to know about this diet?  Use the glycemic index (GI) to plan your meals. The index tells you how quickly a food will raise your blood sugar. Choose low-GI foods. These foods take a longer time to raise blood sugar.  Keep track of how many calories you take in. Eating the right amount of calories will help you to achieve a healthy weight.  You may want to follow a Mediterranean diet. This diet includes a lot of vegetables, lean meats or fish, whole grains, fruits, and healthy oils and fats. What foods can I eat? Grains  Whole grains, such as whole-wheat or whole-grain breads, crackers, tortillas, cereals, and pasta. Stone-ground whole wheat. Pumpernickel  bread. Unsweetened oatmeal. Bulgur. Barley. Quinoa. Brown or wild rice. Corn or whole-wheat flour tortillas. Vegetables  Lettuce. Spinach. Peas. Beets. Cauliflower. Cabbage. Broccoli. Carrots. Tomatoes. Squash. Eggplant. Herbs. Peppers. Onions. Cucumbers. Brussels sprouts. Yams and sweet potatoes. Beans. Lentils. Fruits  Bananas. Apples. Oranges. Grapes. Papaya. Mango. Pomegranate. Kiwi. Grapefruit. Cherries. Meats and Other Protein Sources  Seafood and shellfish. Lean meats. Poultry. Tofu. Dairy  Low-fat or fat-free dairy products, such as yogurt, cottage cheese, and cheese. Beverages  Water. Sugar-free drinks. Tea. Coffee. Low-fat or skim milk. Milk alternatives, such as soy or almond milk. Real fruit juice. Condiments  Mustard. Relish. Low-fat, low-sugar ketchup and barbecue sauce. Low-fat or fat-free mayonnaise. Sweets and Desserts  Sugar-free sweets. Fats and Oils  Avocado. Canola or olive oil. Nuts and nut butters. Seeds. The items listed above may not be a complete list of recommended foods or beverages. Contact your dietitian for more options.  What foods are not recommended? Palm oil and coconut oil. Processed foods. Fried foods. Sweetened drinks, such as sweet tea, milkshakes, snow cones, iced sweet drinks, and sodas. Alcohol. Sweets. Foods that contain a lot of salt or sodium. The items listed above may not be a complete list of foods and beverages to avoid. Contact your dietitian for more information.  This information is not intended to replace advice given to you by your health care provider. Make sure you discuss any questions you have with your health care provider. Document Released: 05/02/2015 Document Revised: 05/23/2016 Document Reviewed: 01/10/2015  2017 Elsevier  Fatty Liver Introduction  Fatty liver, also called hepatic steatosis or steatohepatitis,  is a condition in which too much fat has built up in your liver cells. The liver removes harmful substances from  your bloodstream. It produces fluids your body needs. It also helps your body use and store energy from the food you eat. In many cases, fatty liver does not cause symptoms or problems. It is often diagnosed when tests are being done for other reasons. However, over time, fatty liver can cause inflammation that may lead to more serious liver problems, such as scarring of the liver (cirrhosis). What are the causes? Causes of fatty liver may include:  Drinking too much alcohol.  Poor nutrition.  Obesity.  Cushing syndrome.  Diabetes.  Hyperlipidemia.  Pregnancy.  Certain drugs.  Poisons.  Some viral infections. What increases the risk? You may be more likely to develop fatty liver if you:  Abuse alcohol.  Are pregnant.  Are overweight.  Have diabetes.  Have hepatitis.  Have a high triglyceride level. What are the signs or symptoms? Fatty liver often does not cause any symptoms. In cases where symptoms develop, they can include:  Fatigue.  Weakness.  Weight loss.  Confusion.  Abdominal pain.  Yellowing of your skin and the white parts of your eyes (jaundice).  Nausea and vomiting. How is this diagnosed? Fatty liver may be diagnosed by:  Physical exam and medical history.  Blood tests.  Imaging tests, such as an ultrasound, CT scan, or MRI.  Liver biopsy. A small sample of liver tissue is removed using a needle. The sample is then looked at under a microscope. How is this treated? Fatty liver is often caused by other health conditions. Treatment for fatty liver may involve medicines and lifestyle changes to manage conditions such as:  Alcoholism.  High cholesterol.  Diabetes.  Being overweight or obese. Follow these instructions at home:  Eat a healthy diet as directed by your health care provider.  Exercise regularly. This can help you lose weight and control your cholesterol and diabetes. Talk to your health care provider about an exercise  plan and which activities are best for you.  Do not drink alcohol.  Take medicines only as directed by your health care provider. Contact a health care provider if: You have difficulty controlling your:  Blood sugar.  Cholesterol.  Alcohol consumption. Get help right away if:  You have abdominal pain.  You have jaundice.  You have nausea and vomiting. This information is not intended to replace advice given to you by your health care provider. Make sure you discuss any questions you have with your health care provider.

## 2017-10-15 NOTE — Assessment & Plan Note (Addendum)
No severe episodes like she had in spring.  Controlled on prn meclizine. Patient will let me know symptom worsens, new symptoms develop, or becomes more frequent.

## 2017-10-16 NOTE — Patient Outreach (Addendum)
[facility]) Care Management  [place]   10/16/2017  Vanessa Higgins 1951-09-17 [id number]  Subjective: Patient presents today for a 6 month diabetes follow-up as part of the employer-sponsored [employer] program. Current diabetes regimen includes Victoza and metformin. Patient continues on daily aspirin, ARB and a statin. She is current with her dental and vision exams.   Objective:  A1c = 6.9% (08/13/17) TC = 116 mg/dl; TG = 99 mg/dl; HDL = 44 mg/dl; LDL = 52 mg/dl (10/15/17)  Vitals:   10/15/17 1127  BP: 114/72  Weight: 201 lb (91.2 kg)  Height: 1.626 m ('5\' 4"'$ )    Encounter Medications: Outpatient Encounter Prescriptions as of 10/15/2017  Medication Sig Note  . Albuterol Sulfate 108 (90 Base) MCG/ACT AEPB Take 2 puffs by mouth as needed.  06/05/2016: Received from: [place]  . aspirin 81 MG tablet Take 81 mg by mouth daily.   Marland Kitchen atorvastatin (LIPITOR) 10 MG tablet Take 10 mg by mouth every evening.    Marland Kitchen b complex vitamins tablet Take 1 tablet by mouth daily.   . Fluticasone-Salmeterol (ADVAIR DISKUS) 250-50 MCG/DOSE AEPB INHALE 1 PUFF INTO THE LUNGS 2 TIMES DAILY.   Marland Kitchen glucose blood (BAYER CONTOUR NEXT TEST) test strip Use 2 (two) times daily. 12/06/2015: Received from: [place]  . ibuprofen (ADVIL,MOTRIN) 800 MG tablet Take 800 mg by mouth every 8 (eight) hours as needed. For knee pain   . JARDIANCE 10 MG TABS tablet Take 10 mg by mouth daily.    Marland Kitchen liraglutide (VICTOZA) 18 MG/3ML SOPN 1.8 mg daily 10/15/2017: Using 1.'8mg'$  daily  . loratadine (CLARITIN) 10 MG tablet Take 10 mg by mouth daily.   Marland Kitchen losartan (COZAAR) 25 MG tablet TAKE 1 TABLET BY MOUTH DAILY   . meclizine (ANTIVERT) 25 MG tablet Take 1 tablet (25 mg total) by mouth 3 (three) times daily as needed for dizziness. (Patient taking differently: Take 25 mg by mouth 3 (three) times daily as needed for dizziness. Has only used this once since first  episode)   . metFORMIN (GLUCOPHAGE) 1000 MG tablet Take 1 tablet (1,000 mg total) by mouth 2 (two) times daily with a meal.   . montelukast (SINGULAIR) 10 MG tablet Take 1 tablet (10 mg total) by mouth at bedtime.   . Multiple Vitamins-Minerals (CENTRUM SILVER PO) Take 1 tablet by mouth daily. Reported on 04/17/2016   . Omega-3 Fatty Acids (FISH OIL) 1200 MG CAPS Take 1 capsule by mouth daily.   Marland Kitchen omeprazole (PRILOSEC) 20 MG capsule TAKE 1 CAPSULE (20 MG TOTAL) BY MOUTH DAILY.   Marland Kitchen venlafaxine XR (EFFEXOR-XR) 150 MG 24 hr capsule TAKE 1 CAPSULE BY MOUTH DAILY WITH BREAKFAST.   Marland Kitchen Verapamil HCl CR 300 MG CP24 Take 1 capsule (300 mg total) by mouth daily.   . diazepam (VALIUM) 5 MG tablet Take 1 tablet (5 mg total) by mouth every 8 (eight) hours as needed (vertigo). (Patient not taking: Reported on 10/15/2017)    No facility-administered encounter medications on file as of 10/15/2017.     Functional Status: In your present state of health, do you have any difficulty performing the following activities: 10/15/2017 04/16/2017  Hearing? N N  Vision? N N  Difficulty concentrating or making decisions? N N  Walking or climbing stairs? Y Y  Dressing or bathing? N N  Doing errands, shopping? N N  Some recent data might be hidden    Fall/Depression Screening: Fall Risk  10/15/2017 04/16/2017 12/04/2016  Falls in the past year? No No Yes  Number falls in past yr: - - 2 or more  Comment - - -  Injury with Fall? - - No  Comment - - -   PHQ 2/9 Scores 10/15/2017 04/16/2017 12/04/2016 09/19/2016 08/28/2016 06/05/2016 04/17/2016  PHQ - 2 Score 0 0 0 0 0 0 0    [facility] CM Care Plan Problem One     Most Recent Value  Care Plan Problem One  Maintain A1c at goal of less than 7%  Role Documenting the Problem One  Clinical Pharmacist  Care Plan for Problem One  Active  [facility] Long Term Goal   Patient will maintain A1c below 7% over the next 90 days,  evidenced by physician or patient report  [place] Term Goal Start  Date  10/15/17  [facility] Long Term Goal Met Date  08/13/17 [A1c goal of less than 7% was met on 08/13/17]  Interventions for Problem One Long Term Goal  Continue exercising as tolerated 3 days per week. Continue to attend physician and pharmacy appointments. Continue current medication regimen and adherence. Continue to watch serving/portion sizes.       Assessment: Diabetes: A1c within goal of less than 7%. Checks blood sugar twice daily. Fasting blood sugar values are typically less than 150 mg/dl. Invokana was discontinued due to continued UTI symptoms and concern for amputation risk. Jardiance was added to use as needed for hyperglycemia. She is watching her portion sizes and limiting carbohydrates. Hypertension: Within goal of less than 140/90 mmHg. She is currently on losartan and verapamil (migraine prophylaxis).  Cholesterol:HDL not within goal of >50 mg/dl; all other values within goal. Slight improvement from previous panel. Exercise: Not currently able to exercise due to knee pain. Going for an assessment in November and will discuss knee surgery. Encouraged patient to start back with the walking when she is able to.  Plan: Follow up phone call scheduled for 12/2017. 6 month LTW visit scheduled for 03/2018.  Lorin Hauck K. Dicky Doe, PharmD [place] [facility] [phone]

## 2017-10-22 ENCOUNTER — Ambulatory Visit: Payer: 59 | Admitting: Pharmacist

## 2017-11-04 DIAGNOSIS — M25561 Pain in right knee: Secondary | ICD-10-CM | POA: Diagnosis not present

## 2017-11-04 DIAGNOSIS — M1711 Unilateral primary osteoarthritis, right knee: Secondary | ICD-10-CM | POA: Diagnosis not present

## 2017-11-27 DIAGNOSIS — M1711 Unilateral primary osteoarthritis, right knee: Secondary | ICD-10-CM | POA: Diagnosis not present

## 2017-12-04 ENCOUNTER — Encounter: Payer: Self-pay | Admitting: Physician Assistant

## 2017-12-04 ENCOUNTER — Ambulatory Visit: Payer: Self-pay | Admitting: Physician Assistant

## 2017-12-04 VITALS — BP 130/70 | HR 70 | Temp 97.9°F

## 2017-12-04 DIAGNOSIS — R300 Dysuria: Secondary | ICD-10-CM

## 2017-12-04 LAB — POCT URINALYSIS DIPSTICK
Bilirubin, UA: NEGATIVE
Glucose, UA: NEGATIVE
Nitrite, UA: NEGATIVE
Spec Grav, UA: >=1.03 — AB (ref 1.010–1.025)
Urobilinogen, UA: 0.2 E.U./dL
pH, UA: 5.5 (ref 5.0–8.0)

## 2017-12-04 MED ORDER — PHENAZOPYRIDINE HCL 200 MG PO TABS: 200.0000 mg | ORAL_TABLET | Freq: Three times a day (TID) | ORAL | 0 refills | Status: DC | PRN

## 2017-12-04 MED ORDER — NITROFURANTOIN MONOHYD MACRO 100 MG PO CAPS: 100.0000 mg | ORAL_CAPSULE | Freq: Two times a day (BID) | ORAL | 0 refills | Status: DC

## 2017-12-04 NOTE — Progress Notes (Signed)
   Subjective: UTI     Patient ID: Vanessa Higgins, female    DOB: 1951/12/06, 66 y.o.   MRN: [id number]  HPI    Review of Systems     Objective:   Physical Exam        Assessment & Plan:

## 2017-12-04 NOTE — Progress Notes (Signed)
   Subjective: Dysuria     Patient ID: YOLANI VO, female    DOB: 1951-12-21, 66 y.o.   MRN: [id number]  HPI Patient complain of urinary frequency, urgency, and dysuria for 2 days. Patient denies fever, flank pain, vaginal discharge.   Review of Systems Diabetes and hypertension    Objective:   Physical Exam: Deferred Dipped UA showed positive leukocytes.       Assessment & Plan: UTI   Patient given discharge Instructions. Patient prescription for Macrobid and Pyridium. Patient denies any urinary test after 10 days.

## 2017-12-05 DIAGNOSIS — M1711 Unilateral primary osteoarthritis, right knee: Secondary | ICD-10-CM | POA: Diagnosis not present

## 2017-12-09 ENCOUNTER — Other Ambulatory Visit: Payer: Self-pay | Admitting: Family

## 2017-12-11 DIAGNOSIS — M1711 Unilateral primary osteoarthritis, right knee: Secondary | ICD-10-CM | POA: Diagnosis not present

## 2017-12-29 ENCOUNTER — Other Ambulatory Visit: Payer: Self-pay | Admitting: Family

## 2018-01-21 ENCOUNTER — Ambulatory Visit: Payer: Self-pay | Admitting: Pharmacist

## 2018-01-26 DIAGNOSIS — M79604 Pain in right leg: Secondary | ICD-10-CM | POA: Diagnosis not present

## 2018-01-26 DIAGNOSIS — E119 Type 2 diabetes mellitus without complications: Secondary | ICD-10-CM | POA: Diagnosis not present

## 2018-01-26 DIAGNOSIS — M5440 Lumbago with sciatica, unspecified side: Secondary | ICD-10-CM | POA: Diagnosis not present

## 2018-01-26 DIAGNOSIS — E669 Obesity, unspecified: Secondary | ICD-10-CM | POA: Diagnosis not present

## 2018-01-29 ENCOUNTER — Other Ambulatory Visit: Payer: Self-pay | Admitting: Orthopedic Surgery

## 2018-01-29 DIAGNOSIS — M545 Low back pain, unspecified: Secondary | ICD-10-CM

## 2018-01-29 DIAGNOSIS — M5440 Lumbago with sciatica, unspecified side: Secondary | ICD-10-CM

## 2018-01-29 DIAGNOSIS — M79604 Pain in right leg: Secondary | ICD-10-CM

## 2018-02-01 ENCOUNTER — Emergency Department
Admission: EM | Admit: 2018-02-01 | Discharge: 2018-02-01 | Disposition: A | Discharge status: Home / Self Care | Payer: 59 | Attending: Emergency Medicine | Admitting: Emergency Medicine

## 2018-02-01 ENCOUNTER — Other Ambulatory Visit: Payer: Self-pay

## 2018-02-01 ENCOUNTER — Encounter: Payer: Self-pay | Admitting: Emergency Medicine

## 2018-02-01 ENCOUNTER — Emergency Department: Payer: 59

## 2018-02-01 DIAGNOSIS — Z7982 Long term (current) use of aspirin: Secondary | ICD-10-CM | POA: Diagnosis not present

## 2018-02-01 DIAGNOSIS — Z7984 Long term (current) use of oral hypoglycemic drugs: Secondary | ICD-10-CM | POA: Diagnosis not present

## 2018-02-01 DIAGNOSIS — Z96652 Presence of left artificial knee joint: Secondary | ICD-10-CM | POA: Diagnosis not present

## 2018-02-01 DIAGNOSIS — M170 Bilateral primary osteoarthritis of knee: Secondary | ICD-10-CM | POA: Diagnosis not present

## 2018-02-01 DIAGNOSIS — Z79899 Other long term (current) drug therapy: Secondary | ICD-10-CM | POA: Insufficient documentation

## 2018-02-01 DIAGNOSIS — I10 Essential (primary) hypertension: Secondary | ICD-10-CM | POA: Insufficient documentation

## 2018-02-01 DIAGNOSIS — E119 Type 2 diabetes mellitus without complications: Secondary | ICD-10-CM | POA: Insufficient documentation

## 2018-02-01 DIAGNOSIS — M79661 Pain in right lower leg: Secondary | ICD-10-CM | POA: Diagnosis not present

## 2018-02-01 DIAGNOSIS — J45909 Unspecified asthma, uncomplicated: Secondary | ICD-10-CM | POA: Diagnosis not present

## 2018-02-01 DIAGNOSIS — M5127 Other intervertebral disc displacement, lumbosacral region: Secondary | ICD-10-CM | POA: Diagnosis not present

## 2018-02-01 DIAGNOSIS — M79604 Pain in right leg: Secondary | ICD-10-CM | POA: Diagnosis not present

## 2018-02-01 DIAGNOSIS — M5117 Intervertebral disc disorders with radiculopathy, lumbosacral region: Secondary | ICD-10-CM | POA: Diagnosis not present

## 2018-02-01 LAB — URINALYSIS, COMPLETE (UACMP) WITH MICROSCOPIC
Bilirubin Urine: NEGATIVE
Glucose, UA: >=500 mg/dL — AB
Ketones, ur: 5 mg/dL — AB
Nitrite: NEGATIVE
Protein, ur: 30 mg/dL — AB
Specific Gravity, Urine: 1.032 — ABNORMAL HIGH (ref 1.005–1.030)
pH: 5 (ref 5.0–8.0)

## 2018-02-01 MED ORDER — FOSFOMYCIN TROMETHAMINE 3 G PO PACK: 3.0000 g | PACK | Freq: Once | ORAL | 0 refills | Status: AC

## 2018-02-01 MED ORDER — HYDROCODONE-ACETAMINOPHEN 5-325 MG PO TABS: 1.0000 | ORAL_TABLET | Freq: Four times a day (QID) | ORAL | 0 refills | Status: DC | PRN

## 2018-02-01 MED ORDER — MORPHINE SULFATE (PF) 4 MG/ML IV SOLN
4.0000 mg | Freq: Once | INTRAVENOUS | Status: AC
  Administered 2018-02-01: 4 mg via INTRAVENOUS
  Filled 2018-02-01: qty 1

## 2018-02-01 MED ORDER — ONDANSETRON HCL 4 MG/2ML IJ SOLN
4.0000 mg | Freq: Once | INTRAMUSCULAR | Status: AC
  Administered 2018-02-01: 4 mg via INTRAVENOUS
  Filled 2018-02-01: qty 2

## 2018-02-01 MED ORDER — FOSFOMYCIN TROMETHAMINE 3 G PO PACK
3.0000 g | PACK | ORAL | Status: AC
  Administered 2018-02-01: 3 g via ORAL
  Filled 2018-02-01: qty 3

## 2018-02-01 NOTE — ED Notes (Signed)
Pt taken to MRI by this RN. Undressed and all jewelry removed. Placed in gown and given warm blanket.

## 2018-02-01 NOTE — Discharge Instructions (Signed)
° °  Please follow up with your doctor as soon as possible regarding today's ED visit and your back pain.  Return to the ED for worsening back pain, fever, weakness or numbness of either leg, or if you develop either (1) an inability to urinate or have bowel movements, or (2) loss of your ability to control your bathroom functions (if you start having "accidents"), or if you develop other new symptoms that concern you.

## 2018-02-01 NOTE — ED Notes (Signed)
Pt talking to MRI on the phone.  

## 2018-02-01 NOTE — ED Notes (Signed)
Pt verbalizes understanding of discharge instructions.

## 2018-02-01 NOTE — ED Notes (Signed)
Signature pad did not work in room pt signed paper

## 2018-02-01 NOTE — ED Provider Notes (Signed)
[facility] Emergency Department Provider Note  ____________________________________________   First MD Initiated Contact with Patient 02/01/18 0813     (approximate)  I have reviewed the triage vital signs and the nursing notes.   HISTORY  Chief Complaint Leg Pain    HPI Vanessa Higgins is a 67 y.o. female history of hypertension hyperlipidemia, left knee replacement, and osteoarthritis in both knees  Patient reports that for a little over a week now she is been experiencing significant pain in her right hip and right upper leg.  She reports of pain that is sharp and seems to radiate from her groin down to about the inner area of her knee.  It hurts notably, she has to limp slightly when she walks.  Denies weakness in the leg.  No numbness or tingling.  No nausea vomiting.  No headache.  Reports some slight pain in her right lower back as well.  She has been taking Percocet, she reports she only has about 2 tablets left.  The pain has been fairly unbearable at times, difficult to control.  Much worse when she tries to walk.  She is also noticed that the last few days she has had some changes with urination which are hard to describe.    Past Medical History:  Diagnosis Date  . Allergy    hay fever  . Ankle fracture, left 04/08/2014  . Arthritis   . Asthma   . Blood transfusion abn reaction or complication, no procedure mishap   . Diabetes mellitus   . GERD (gastroesophageal reflux disease)   . Headache(784.0)   . Hyperlipidemia   . Hypertension   . Migraine   . Obstructive sleep apnea    CPAP  . Sleep apnea 04/06/2017  . UTI (lower urinary tract infection)   . Wears glasses     Patient Active Problem List   Diagnosis Date Noted  . Fatty liver disease, nonalcoholic 07/37/1062  . GERD (gastroesophageal reflux disease) 10/15/2017  . PVC's (premature ventricular contractions) 08/01/2017  . OSA (obstructive sleep apnea) 04/21/2017  . Vertigo  03/26/2017  . Elevated liver enzymes 12/03/2016  . Cough 06/05/2016  . Irregular heart rate 06/05/2016  . Routine general medical examination at a health care facility 05/03/2013  . Diabetes mellitus type 2, controlled ([place]) 07/09/2012  . Hypertension 07/09/2012  . Hyperlipidemia 07/09/2012  . Asthma 07/09/2012  . Obesity 07/09/2012    Past Surgical History:  Procedure Laterality Date  . APPENDECTOMY  1977  . COLONOSCOPY    . ORIF ANKLE FRACTURE Left 04/08/2014   Procedure: OPEN REDUCTION INTERNAL FIXATION (ORIF) ANKLE FRACTURE LEFT ANKLE FRACTURE OPEN TREATMENT BIMALLEOLAR ANKLE INCLUDES INTERNAL FIXATION;  Surgeon: Johnny Bridge, MD;  Location: [facility];  Service: Orthopedics;  Laterality: Left;  . PAROTIDECTOMY  1980   left  . TONSILLECTOMY AND ADENOIDECTOMY     as a child  . 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 TUBAL LIGATION  05/1976    Prior to Admission medications   Medication Sig Start Date End Date Taking? Authorizing Provider  Albuterol Sulfate 108 (90 Base) MCG/ACT AEPB Take 2 puffs by mouth as needed.     [provider]  aspirin 81 MG tablet Take 81 mg by mouth daily.    [provider]  atorvastatin (LIPITOR) 10 MG tablet Take 10 mg by mouth every evening.     [provider]  b complex vitamins tablet Take 1 tablet  by mouth daily.    [provider]  Fluticasone-Salmeterol (ADVAIR DISKUS) 250-50 MCG/DOSE AEPB INHALE 1 PUFF INTO THE LUNGS 2 TIMES DAILY. 12/29/17   Burnard Hawthorne, FNP  fosfomycin (MONUROL) 3 g PACK Take 3 g by mouth once for 1 dose. Please mix in 8 oz of water, take by mouth once 02/04/18 02/04/18  Delman Kitten, MD  glucose blood (BAYER CONTOUR NEXT TEST) test strip Use 2 (two) times daily. 05/17/15   [provider]  HYDROcodone-acetaminophen (NORCO) 5-325 MG tablet Take 1-2 tablets by mouth every 6 (six) hours as needed for severe pain. 02/01/18   Delman Kitten, MD  ibuprofen  (ADVIL,MOTRIN) 800 MG tablet Take 800 mg by mouth every 8 (eight) hours as needed. For knee pain    [provider]  JARDIANCE 10 MG TABS tablet Take 10 mg by mouth daily.  08/06/17   Elease Etienne, MD  liraglutide (VICTOZA) 18 MG/3ML SOPN 1.8 mg daily 05/05/17   [provider]  loratadine (CLARITIN) 10 MG tablet Take 10 mg by mouth daily.    [provider]  losartan (COZAAR) 25 MG tablet TAKE 1 TABLET BY MOUTH DAILY 12/29/17   Burnard Hawthorne, FNP  meclizine (ANTIVERT) 25 MG tablet Take 1 tablet (25 mg total) by mouth 3 (three) times daily as needed for dizziness. Patient taking differently: Take 25 mg by mouth 3 (three) times daily as needed for dizziness. Has only used this once since first episode 03/22/17   Alfred Levins, [place], MD  metFORMIN (GLUCOPHAGE) 1000 MG tablet Take 1 tablet (1,000 mg total) by mouth 2 (two) times daily with a meal. 08/19/13   Jackolyn Confer, MD  montelukast (SINGULAIR) 10 MG tablet Take 1 tablet (10 mg total) by mouth at bedtime. 07/03/17   Burnard Hawthorne, FNP  Multiple Vitamins-Minerals (CENTRUM SILVER PO) Take 1 tablet by mouth daily. Reported on 04/17/2016    [provider]  Omega-3 Fatty Acids (FISH OIL) 1200 MG CAPS Take 1 capsule by mouth daily.    [provider]  omeprazole (PRILOSEC) 20 MG capsule TAKE 1 CAPSULE (20 MG TOTAL) BY MOUTH DAILY. 10/03/17   Burnard Hawthorne, FNP  phenazopyridine (PYRIDIUM) 200 MG tablet Take 1 tablet (200 mg total) by mouth 3 (three) times daily as needed for pain. 12/04/17   Sable Feil, PA-C  venlafaxine XR (EFFEXOR-XR) 150 MG 24 hr capsule TAKE 1 CAPSULE BY MOUTH DAILY WITH BREAKFAST. 12/10/17   Burnard Hawthorne, FNP  Verapamil HCl CR 300 MG CP24 Take 1 capsule (300 mg total) by mouth daily. 08/11/17   Burnard Hawthorne, FNP    Allergies Sulfa antibiotics and Ramipril  Family History  Problem Relation Age of Onset  . Sudden death Mother   . Cancer Mother 42        pancreatic  . Heart disease Father 43  . Sudden death Father   . Cancer Brother        colon  . Stroke Maternal Grandmother   . Heart disease Maternal Grandfather   . Cancer Sister        brain  . Breast cancer Neg Hx     Social History Social History   Tobacco Use  . Smoking status: Never Smoker  . Smokeless tobacco: Never Used  Substance Use Topics  . Alcohol use: No  . Drug use: No    Review of Systems Constitutional: No fever/chills Eyes: No visual changes. ENT: No sore throat. Cardiovascular: Denies  chest pain. Respiratory: Denies shortness of breath. Gastrointestinal: No abdominal pain.  No nausea, no vomiting.  No diarrhea.  No constipation. Genitourinary: Some slight dysuria, feeling as though there is been a change in her urination, somewhat hard to describe.  Denies urinary retention. Musculoskeletal: See HPI Skin: Negative for rash. Neurological: Negative for headaches, focal weakness or numbness.    ____________________________________________   PHYSICAL EXAM:  VITAL SIGNS: ED Triage Vitals  Enc Vitals Group     BP 02/01/18 0749 140/67     Pulse Rate 02/01/18 0749 82     Resp 02/01/18 0749 18     Temp 02/01/18 0749 98.4 F (36.9 C)     Temp Source 02/01/18 0749 Oral     SpO2 02/01/18 0749 95 %     Weight 02/01/18 0743 204 lb (92.5 kg)     Height 02/01/18 0743 5\' 4"  (1.626 m)     Head Circumference --      Peak Flow --      Pain Score 02/01/18 0741 7     Pain Loc --      Pain Edu? --      Excl. in [place]? --     Constitutional: Alert and oriented. Well appearing and in no acute distress. Eyes: Conjunctivae are normal. Head: Atraumatic. Nose: No congestion/rhinnorhea. Mouth/Throat: Mucous membranes are moist. Neck: No stridor.   Cardiovascular: Normal rate, regular rhythm. Grossly normal heart sounds.  Good peripheral circulation. Respiratory: Normal respiratory effort.  No retractions. Lungs CTAB. Gastrointestinal: Soft and nontender. No  distention. Musculoskeletal:    Lower Extremities  No edema. Normal DP/PT pulses bilateral with good cap refill.  Normal neuro-motor function lower extremities bilateral.  RIGHT Right lower extremity demonstrates normal strength, good use of all muscles though some limitation due to pain is produced when she fully extends and flexes at the hip. No edema bruising or contusions of the right hip, right knee, right ankle. Full range of motion of the right lower extremity with good strength. No pain on axial loading. No evidence of trauma.  Normal strength with dorsi and plantarflexion of the foot and also of the toes.  Patient reports pain along the distribution primarily along the medial right thigh.  There is no loss of sensation.  LEFT Left lower extremity demonstrates normal strength, good use of all muscles. No edema bruising or contusions of the hip,  knee, ankle. Full range of motion of the left lower extremity without pain. No pain on axial loading. No evidence of trauma.   Neurologic:  Normal speech and language. No gross focal neurologic deficits are appreciated.  Skin:  Skin is warm, dry and intact. No rash noted. Psychiatric: Mood and affect are normal. Speech and behavior are normal.  ____________________________________________   LABS (all labs ordered are listed, but only abnormal results are displayed)  Labs Reviewed  URINALYSIS, COMPLETE (UACMP) WITH MICROSCOPIC - Abnormal; Notable for the following components:      Result Value   Color, Urine YELLOW (*)    APPearance CLOUDY (*)    Specific Gravity, Urine 1.032 (*)    Glucose, UA >=500 (*)    Hgb urine dipstick SMALL (*)    Ketones, ur 5 (*)    Protein, ur 30 (*)    Leukocytes, UA LARGE (*)    Bacteria, UA RARE (*)    Squamous Epithelial / LPF 0-5 (*)    All other components within normal limits    ____________________________________________  EKG   ____________________________________________  RADIOLOGY    Ultrasound reviewed, no evidence DVT MRI of the lumbar spine reviewed, L2-L3 broad disc bulge.  Please see full radiology report for further, ____________________________________________   PROCEDURES  Procedure(s) performed: None  Procedures  Critical Care performed: No  ____________________________________________   INITIAL IMPRESSION / ASSESSMENT AND PLAN / ED COURSE  Pertinent labs & imaging results that were available during my care of the patient were reviewed by me and considered in my medical decision making (see chart for details).  Patient presents for evaluation of right inner thigh pain.  This is in the setting of some slight lower right back pain.  She has no acute neurologic deficits.  Pain is severe.  She also reports some mild urinary symptoms, though does not have loss of sensation across the perineum.  She denies urinary overflow incontinence, but does report some symptomatology related to urinary tract.  Her overall exam is reassuring for not having cauda equina syndrome.  She does however have notable radicular pain along the right medial leg which could represent lumbar discomfort potentially an HNP.  Clinical Course as of Feb 01 2099  Nancy Fetter Feb 01, 2018  1340 Awaiting call back from Neuro surgery at [facility] re: MRI results review and clinical consultation.  [MQ]    Clinical Course User Index [MQ] Delman Kitten, MD   Upon review of the MRI, appears consistent with herniated nucleus pulposus involving the right mid lumbar spine.  Discussed with the patient, discussed with neurosurgery Dr. Trenton Gammon at [facility], he advises outpatient care, notes that after review of the MRI there does not appear any risk at all for cauda equina at this time.  Discussed with patient and her daughter, both comfortable to plan to follow-up closely with her primary  doctor, but also with neurosurgery this week.  She reports she only has 1-2 tablets of Norco left at home, I will give her a refill on this for the next 2 days and discussed careful use of this medication with her and very careful return precautions.  Patient is agreeable with this plan.  Family will be driving her home  ____________________________________________   FINAL CLINICAL IMPRESSION(S) / ED DIAGNOSES  Final diagnoses:  Herniated nucleus pulposus of lumbosacral region      NEW MEDICATIONS STARTED DURING THIS VISIT:  Discharge Medication List as of 02/01/2018  3:25 PM    START taking these medications   Details  fosfomycin (MONUROL) 3 g PACK Take 3 g by mouth once for 1 dose. Please mix in 8 oz of water, take by mouth once, Starting Wed 02/04/2018, Print    HYDROcodone-acetaminophen (NORCO) 5-325 MG tablet Take 1-2 tablets by mouth every 6 (six) hours as needed for severe pain., Starting Sun 02/01/2018, Print         Note:  This document was prepared using Dragon voice recognition software and may include unintentional dictation errors.     Delman Kitten, MD 02/01/18 2100

## 2018-02-01 NOTE — ED Triage Notes (Signed)
C/o pain from from right groin down leg.  Pain mostly to upper thigh that pt describes as constant pain in muscle.  Present X 1 week.  Saw PCP with negative hip xray and no improvement with steroid and gabapentin.  Pt denies redness.

## 2018-02-01 NOTE — ED Notes (Signed)
Pt returned from MRI °

## 2018-02-03 ENCOUNTER — Other Ambulatory Visit: Payer: Self-pay | Admitting: Neurosurgery

## 2018-02-03 DIAGNOSIS — Z6834 Body mass index (BMI) 34.0-34.9, adult: Secondary | ICD-10-CM | POA: Diagnosis not present

## 2018-02-03 DIAGNOSIS — M5126 Other intervertebral disc displacement, lumbar region: Secondary | ICD-10-CM | POA: Diagnosis not present

## 2018-02-05 ENCOUNTER — Encounter (HOSPITAL_COMMUNITY): Payer: Self-pay | Admitting: *Deleted

## 2018-02-05 ENCOUNTER — Other Ambulatory Visit: Payer: Self-pay

## 2018-02-05 ENCOUNTER — Ambulatory Visit: Payer: 59

## 2018-02-05 NOTE — Progress Notes (Signed)
Pt denies SOB, chest pain, and being under the care of a cardiologist. Pt denies having a cardiac cath. Pt stated that will be finished medication for treatment of UTI tonight " It is a pack that you mix together-I never saw that before." Pt stated that she has not taken  Aspirin in the last 2 weeks because she " ran out."  Pt made aware to stop taking vitamins, fish oil and herbal medications. Do not take any NSAIDs ie: Ibuprofen, Advil, Naproxen (Aleve), Motrin, BC and Goody Powder. Pt made aware to not take Metformin, Jardiance and Victoza the DOS. Pt made aware to check BG every 2 hours prior to arrival to hospital on DOS. Pt made aware to treat a BG < 70 with  4 ounces of  cranberry juice, wait 15 minutes after intervention to recheck BG, if BG remains < 70, call Short Stay unit to speak with a nurse. Pt verbalized understanding of all pre-op instructions.

## 2018-02-06 ENCOUNTER — Encounter (HOSPITAL_COMMUNITY)
Admission: AD | Disposition: A | Discharge status: Home / Self Care | Payer: Self-pay | Source: Ambulatory Visit | Attending: Neurosurgery

## 2018-02-06 ENCOUNTER — Inpatient Hospital Stay (HOSPITAL_COMMUNITY)
Admission: AD | Admit: 2018-02-06 | Discharge: 2018-02-09 | DRG: 520 | Disposition: A | Discharge status: Home / Self Care | Payer: 59 | Source: Ambulatory Visit | Attending: Neurosurgery | Admitting: Neurosurgery

## 2018-02-06 ENCOUNTER — Encounter (HOSPITAL_COMMUNITY): Payer: Self-pay | Admitting: Certified Registered"

## 2018-02-06 ENCOUNTER — Ambulatory Visit (HOSPITAL_COMMUNITY): Payer: 59 | Admitting: Anesthesiology

## 2018-02-06 ENCOUNTER — Other Ambulatory Visit: Payer: Self-pay

## 2018-02-06 ENCOUNTER — Ambulatory Visit (HOSPITAL_COMMUNITY): Payer: 59

## 2018-02-06 DIAGNOSIS — M5126 Other intervertebral disc displacement, lumbar region: Secondary | ICD-10-CM | POA: Diagnosis not present

## 2018-02-06 DIAGNOSIS — Z7982 Long term (current) use of aspirin: Secondary | ICD-10-CM

## 2018-02-06 DIAGNOSIS — Z981 Arthrodesis status: Secondary | ICD-10-CM | POA: Diagnosis not present

## 2018-02-06 DIAGNOSIS — Z882 Allergy status to sulfonamides status: Secondary | ICD-10-CM | POA: Diagnosis not present

## 2018-02-06 DIAGNOSIS — J45909 Unspecified asthma, uncomplicated: Secondary | ICD-10-CM | POA: Diagnosis not present

## 2018-02-06 DIAGNOSIS — M5440 Lumbago with sciatica, unspecified side: Secondary | ICD-10-CM | POA: Diagnosis not present

## 2018-02-06 DIAGNOSIS — Z419 Encounter for procedure for purposes other than remedying health state, unspecified: Secondary | ICD-10-CM

## 2018-02-06 DIAGNOSIS — R339 Retention of urine, unspecified: Secondary | ICD-10-CM | POA: Diagnosis present

## 2018-02-06 DIAGNOSIS — I10 Essential (primary) hypertension: Secondary | ICD-10-CM | POA: Diagnosis not present

## 2018-02-06 DIAGNOSIS — G4733 Obstructive sleep apnea (adult) (pediatric): Secondary | ICD-10-CM | POA: Diagnosis not present

## 2018-02-06 HISTORY — DX: Other specified postprocedural states: Z98.890

## 2018-02-06 HISTORY — DX: Other specified postprocedural states: R11.2

## 2018-02-06 HISTORY — DX: Major depressive disorder, single episode, unspecified: F32.9

## 2018-02-06 HISTORY — DX: Other intervertebral disc displacement, lumbar region: M51.26

## 2018-02-06 HISTORY — DX: Fatty (change of) liver, not elsewhere classified: K76.0

## 2018-02-06 HISTORY — PX: LUMBAR LAMINECTOMY/DECOMPRESSION MICRODISCECTOMY: SHX5026

## 2018-02-06 HISTORY — DX: Depression, unspecified: F32.A

## 2018-02-06 LAB — CBC
HCT: 41 % (ref 36.0–46.0)
Hemoglobin: 12.8 g/dL (ref 12.0–15.0)
MCH: 26.7 pg (ref 26.0–34.0)
MCHC: 31.2 g/dL (ref 30.0–36.0)
MCV: 85.6 fL (ref 78.0–100.0)
Platelets: 339 10*3/uL (ref 150–400)
RBC: 4.79 MIL/uL (ref 3.87–5.11)
RDW: 16.9 % — ABNORMAL HIGH (ref 11.5–15.5)
WBC: 9.3 10*3/uL (ref 4.0–10.5)

## 2018-02-06 LAB — GLUCOSE, CAPILLARY
Glucose-Capillary: 114 mg/dL — ABNORMAL HIGH (ref 65–99)
Glucose-Capillary: 103 mg/dL — ABNORMAL HIGH (ref 65–99)
Glucose-Capillary: 179 mg/dL — ABNORMAL HIGH (ref 65–99)
Glucose-Capillary: 164 mg/dL — ABNORMAL HIGH (ref 65–99)

## 2018-02-06 LAB — BASIC METABOLIC PANEL
Anion gap: 15 (ref 5–15)
BUN: 8 mg/dL (ref 6–20)
CO2: 24 mmol/L (ref 22–32)
Calcium: 9.5 mg/dL (ref 8.9–10.3)
Chloride: 98 mmol/L — ABNORMAL LOW (ref 101–111)
Creatinine, Ser: 0.55 mg/dL (ref 0.44–1.00)
GFR calc Af Amer: >60 mL/min (ref >60)
GFR calc non Af Amer: >60 mL/min (ref >60)
Glucose, Bld: 123 mg/dL — ABNORMAL HIGH (ref 65–99)
Potassium: 3.9 mmol/L (ref 3.5–5.1)
Sodium: 137 mmol/L (ref 135–145)

## 2018-02-06 SURGERY — LUMBAR LAMINECTOMY/DECOMPRESSION MICRODISCECTOMY 1 LEVEL
Anesthesia: General | Site: Back | Laterality: Right

## 2018-02-06 MED ORDER — MIDAZOLAM HCL 5 MG/5ML IJ SOLN
INTRAMUSCULAR | Status: DC | PRN
  Administered 2018-02-06: 2 mg via INTRAVENOUS

## 2018-02-06 MED ORDER — ATORVASTATIN CALCIUM 10 MG PO TABS
10.0000 mg | ORAL_TABLET | Freq: Every day | ORAL | Status: DC
  Administered 2018-02-06 – 2018-02-08 (×3): 10 mg via ORAL
  Filled 2018-02-06 (×3): qty 1

## 2018-02-06 MED ORDER — ONDANSETRON HCL 4 MG/2ML IJ SOLN: 4.0000 mg | Freq: Four times a day (QID) | INTRAMUSCULAR | Status: DC | PRN

## 2018-02-06 MED ORDER — LORATADINE 10 MG PO TABS
10.0000 mg | ORAL_TABLET | Freq: Every day | ORAL | Status: DC
  Administered 2018-02-07 – 2018-02-09 (×3): 10 mg via ORAL
  Filled 2018-02-06 (×3): qty 1

## 2018-02-06 MED ORDER — MENTHOL 3 MG MT LOZG: 1.0000 | LOZENGE | OROMUCOSAL | Status: DC | PRN

## 2018-02-06 MED ORDER — ACETAMINOPHEN 650 MG RE SUPP: 650.0000 mg | RECTAL | Status: DC | PRN

## 2018-02-06 MED ORDER — GELATIN ABSORBABLE MT POWD
OROMUCOSAL | Status: DC | PRN
  Administered 2018-02-06: 5 mL via TOPICAL

## 2018-02-06 MED ORDER — SUGAMMADEX SODIUM 200 MG/2ML IV SOLN
INTRAVENOUS | Status: AC
  Filled 2018-02-06: qty 2

## 2018-02-06 MED ORDER — GABAPENTIN 300 MG PO CAPS
300.0000 mg | ORAL_CAPSULE | Freq: Three times a day (TID) | ORAL | Status: DC
  Administered 2018-02-06 – 2018-02-09 (×8): 300 mg via ORAL
  Filled 2018-02-06 (×8): qty 1

## 2018-02-06 MED ORDER — ONDANSETRON HCL 4 MG PO TABS
4.0000 mg | ORAL_TABLET | Freq: Four times a day (QID) | ORAL | Status: DC | PRN
Start: 2018-02-06 — End: 2018-02-09

## 2018-02-06 MED ORDER — DOCUSATE SODIUM 100 MG PO CAPS
100.0000 mg | ORAL_CAPSULE | Freq: Two times a day (BID) | ORAL | Status: DC
  Administered 2018-02-06 – 2018-02-09 (×6): 100 mg via ORAL
  Filled 2018-02-06 (×6): qty 1

## 2018-02-06 MED ORDER — PHENYLEPHRINE 40 MCG/ML (10ML) SYRINGE FOR IV PUSH (FOR BLOOD PRESSURE SUPPORT)
PREFILLED_SYRINGE | INTRAVENOUS | Status: AC
  Filled 2018-02-06: qty 10

## 2018-02-06 MED ORDER — OXYCODONE HCL 5 MG PO TABS: 10.0000 mg | ORAL_TABLET | ORAL | Status: DC | PRN

## 2018-02-06 MED ORDER — SODIUM CHLORIDE 0.9% FLUSH
3.0000 mL | Freq: Two times a day (BID) | INTRAVENOUS | Status: DC
  Administered 2018-02-07 – 2018-02-09 (×3): 3 mL via INTRAVENOUS

## 2018-02-06 MED ORDER — ONDANSETRON HCL 4 MG/2ML IJ SOLN
INTRAMUSCULAR | Status: DC | PRN
  Administered 2018-02-06: 4 mg via INTRAVENOUS

## 2018-02-06 MED ORDER — FENTANYL CITRATE (PF) 250 MCG/5ML IJ SOLN
INTRAMUSCULAR | Status: DC | PRN
  Administered 2018-02-06: 50 ug via INTRAVENOUS
  Administered 2018-02-06: 100 ug via INTRAVENOUS
  Administered 2018-02-06 (×4): 50 ug via INTRAVENOUS

## 2018-02-06 MED ORDER — ROCURONIUM BROMIDE 10 MG/ML (PF) SYRINGE
PREFILLED_SYRINGE | INTRAVENOUS | Status: AC
  Filled 2018-02-06: qty 10

## 2018-02-06 MED ORDER — PANTOPRAZOLE SODIUM 40 MG PO TBEC
40.0000 mg | DELAYED_RELEASE_TABLET | Freq: Every day | ORAL | Status: DC
  Administered 2018-02-07 – 2018-02-09 (×3): 40 mg via ORAL
  Filled 2018-02-06 (×4): qty 1

## 2018-02-06 MED ORDER — 0.9 % SODIUM CHLORIDE (POUR BTL) OPTIME
TOPICAL | Status: DC | PRN
  Administered 2018-02-06: 1000 mL

## 2018-02-06 MED ORDER — PROPOFOL 10 MG/ML IV BOLUS
INTRAVENOUS | Status: DC | PRN
  Administered 2018-02-06: 120 mg via INTRAVENOUS

## 2018-02-06 MED ORDER — OXYCODONE HCL ER 10 MG PO T12A
10.0000 mg | EXTENDED_RELEASE_TABLET | Freq: Two times a day (BID) | ORAL | Status: DC
  Administered 2018-02-06 – 2018-02-09 (×6): 10 mg via ORAL
  Filled 2018-02-06 (×6): qty 1

## 2018-02-06 MED ORDER — PHENAZOPYRIDINE HCL 200 MG PO TABS: 200.0000 mg | ORAL_TABLET | Freq: Three times a day (TID) | ORAL | Status: DC | PRN

## 2018-02-06 MED ORDER — MORPHINE SULFATE (PF) 2 MG/ML IV SOLN
1.0000 mg | INTRAVENOUS | Status: DC | PRN
Start: 2018-02-06 — End: 2018-02-09

## 2018-02-06 MED ORDER — THROMBIN 5000 UNITS EX SOLR
CUTANEOUS | Status: DC | PRN
  Administered 2018-02-06 (×2): 5000 [IU] via TOPICAL

## 2018-02-06 MED ORDER — LIDOCAINE 2% (20 MG/ML) 5 ML SYRINGE
INTRAMUSCULAR | Status: DC | PRN
  Administered 2018-02-06: 60 mg via INTRAVENOUS

## 2018-02-06 MED ORDER — SUGAMMADEX SODIUM 200 MG/2ML IV SOLN
INTRAVENOUS | Status: DC | PRN
  Administered 2018-02-06: 200 mg via INTRAVENOUS

## 2018-02-06 MED ORDER — LACTATED RINGERS IV SOLN
INTRAVENOUS | Status: DC
  Administered 2018-02-06 (×2): via INTRAVENOUS

## 2018-02-06 MED ORDER — KETOROLAC TROMETHAMINE 15 MG/ML IJ SOLN
7.5000 mg | Freq: Four times a day (QID) | INTRAMUSCULAR | Status: AC
  Administered 2018-02-07 (×4): 7.5 mg via INTRAVENOUS
  Filled 2018-02-06 (×4): qty 1

## 2018-02-06 MED ORDER — BISACODYL 5 MG PO TBEC: 5.0000 mg | DELAYED_RELEASE_TABLET | Freq: Every day | ORAL | Status: DC | PRN

## 2018-02-06 MED ORDER — THROMBIN (RECOMBINANT) 5000 UNITS EX SOLR: CUTANEOUS | Status: DC | PRN

## 2018-02-06 MED ORDER — LIDOCAINE-EPINEPHRINE 0.5 %-1:200000 IJ SOLN
INTRAMUSCULAR | Status: DC | PRN
  Administered 2018-02-06: 10 mL

## 2018-02-06 MED ORDER — SODIUM CHLORIDE 0.9% FLUSH: 3.0000 mL | INTRAVENOUS | Status: DC | PRN

## 2018-02-06 MED ORDER — OXYCODONE HCL 5 MG PO TABS: 5.0000 mg | ORAL_TABLET | ORAL | Status: DC | PRN

## 2018-02-06 MED ORDER — CHLORHEXIDINE GLUCONATE CLOTH 2 % EX PADS: 6.0000 | MEDICATED_PAD | Freq: Once | CUTANEOUS | Status: DC

## 2018-02-06 MED ORDER — SODIUM CHLORIDE 0.9 % IV SOLN: 250.0000 mL | INTRAVENOUS | Status: DC

## 2018-02-06 MED ORDER — PHENOL 1.4 % MT LIQD: 1.0000 | OROMUCOSAL | Status: DC | PRN

## 2018-02-06 MED ORDER — POTASSIUM CHLORIDE IN NACL 20-0.9 MEQ/L-% IV SOLN
INTRAVENOUS | Status: DC
  Administered 2018-02-06: 22:00:00 via INTRAVENOUS
  Filled 2018-02-06: qty 1000

## 2018-02-06 MED ORDER — ACETAMINOPHEN 325 MG PO TABS
650.0000 mg | ORAL_TABLET | ORAL | Status: DC | PRN
Start: 2018-02-06 — End: 2018-02-09

## 2018-02-06 MED ORDER — SCOPOLAMINE 1 MG/3DAYS TD PT72
MEDICATED_PATCH | TRANSDERMAL | Status: DC | PRN
  Administered 2018-02-06: 1 via TRANSDERMAL

## 2018-02-06 MED ORDER — CEFAZOLIN SODIUM-DEXTROSE 2-4 GM/100ML-% IV SOLN
2.0000 g | INTRAVENOUS | Status: AC
  Administered 2018-02-06: 2 g via INTRAVENOUS
  Filled 2018-02-06: qty 100

## 2018-02-06 MED ORDER — DIAZEPAM 5 MG PO TABS: 5.0000 mg | ORAL_TABLET | Freq: Four times a day (QID) | ORAL | Status: DC | PRN

## 2018-02-06 MED ORDER — MAGNESIUM CITRATE PO SOLN: 1.0000 | Freq: Once | ORAL | Status: DC | PRN

## 2018-02-06 MED ORDER — ONDANSETRON HCL 4 MG/2ML IJ SOLN
INTRAMUSCULAR | Status: AC
  Filled 2018-02-06: qty 2

## 2018-02-06 MED ORDER — VENLAFAXINE HCL ER 75 MG PO CP24
150.0000 mg | ORAL_CAPSULE | Freq: Every day | ORAL | Status: DC
  Administered 2018-02-07 – 2018-02-09 (×3): 150 mg via ORAL
  Filled 2018-02-06 (×3): qty 2

## 2018-02-06 MED ORDER — LIRAGLUTIDE 18 MG/3ML ~~LOC~~ SOPN: 1.8000 mg | PEN_INJECTOR | Freq: Every day | SUBCUTANEOUS | Status: DC

## 2018-02-06 MED ORDER — MONTELUKAST SODIUM 10 MG PO TABS
10.0000 mg | ORAL_TABLET | Freq: Every day | ORAL | Status: DC
  Administered 2018-02-06 – 2018-02-08 (×3): 10 mg via ORAL
  Filled 2018-02-06 (×4): qty 1

## 2018-02-06 MED ORDER — FENTANYL CITRATE (PF) 100 MCG/2ML IJ SOLN: 25.0000 ug | INTRAMUSCULAR | Status: DC | PRN

## 2018-02-06 MED ORDER — B COMPLEX-C PO TABS
1.0000 | ORAL_TABLET | Freq: Every day | ORAL | Status: DC
  Administered 2018-02-06 – 2018-02-09 (×4): 1 via ORAL
  Filled 2018-02-06 (×4): qty 1

## 2018-02-06 MED ORDER — LOSARTAN POTASSIUM 50 MG PO TABS
25.0000 mg | ORAL_TABLET | Freq: Every day | ORAL | Status: DC
  Administered 2018-02-06 – 2018-02-09 (×3): 25 mg via ORAL
  Filled 2018-02-06 (×4): qty 1

## 2018-02-06 MED ORDER — MECLIZINE HCL 12.5 MG PO TABS: 25.0000 mg | ORAL_TABLET | Freq: Three times a day (TID) | ORAL | Status: DC | PRN

## 2018-02-06 MED ORDER — OXYCODONE HCL 5 MG PO TABS: 5.0000 mg | ORAL_TABLET | Freq: Once | ORAL | Status: DC | PRN

## 2018-02-06 MED ORDER — PROPOFOL 10 MG/ML IV BOLUS
INTRAVENOUS | Status: AC
  Filled 2018-02-06: qty 20

## 2018-02-06 MED ORDER — ASPIRIN 81 MG PO CHEW
81.0000 mg | CHEWABLE_TABLET | Freq: Every day | ORAL | Status: DC
  Administered 2018-02-07 – 2018-02-09 (×3): 81 mg via ORAL
  Filled 2018-02-06 (×3): qty 1

## 2018-02-06 MED ORDER — MIDAZOLAM HCL 2 MG/2ML IJ SOLN
INTRAMUSCULAR | Status: AC
  Filled 2018-02-06: qty 2

## 2018-02-06 MED ORDER — FENTANYL CITRATE (PF) 250 MCG/5ML IJ SOLN
INTRAMUSCULAR | Status: AC
  Filled 2018-02-06: qty 5

## 2018-02-06 MED ORDER — MOMETASONE FURO-FORMOTEROL FUM 200-5 MCG/ACT IN AERO
2.0000 | INHALATION_SPRAY | Freq: Two times a day (BID) | RESPIRATORY_TRACT | Status: DC
Start: 2018-02-06 — End: 2018-02-09
  Administered 2018-02-07 – 2018-02-09 (×5): 2 via RESPIRATORY_TRACT
  Filled 2018-02-06: qty 8.8

## 2018-02-06 MED ORDER — THROMBIN (RECOMBINANT) 5000 UNITS EX SOLR
CUTANEOUS | Status: AC
  Filled 2018-02-06: qty 5000

## 2018-02-06 MED ORDER — THROMBIN 5000 UNITS EX SOLR
CUTANEOUS | Status: AC
  Filled 2018-02-06: qty 10000

## 2018-02-06 MED ORDER — SENNOSIDES-DOCUSATE SODIUM 8.6-50 MG PO TABS: 1.0000 | ORAL_TABLET | Freq: Every evening | ORAL | Status: DC | PRN

## 2018-02-06 MED ORDER — OXYCODONE HCL 5 MG/5ML PO SOLN: 5.0000 mg | Freq: Once | ORAL | Status: DC | PRN

## 2018-02-06 MED ORDER — ROCURONIUM BROMIDE 10 MG/ML (PF) SYRINGE
PREFILLED_SYRINGE | INTRAVENOUS | Status: DC | PRN
  Administered 2018-02-06: 40 mg via INTRAVENOUS

## 2018-02-06 MED ORDER — ALBUTEROL SULFATE (2.5 MG/3ML) 0.083% IN NEBU: 2.5000 mg | INHALATION_SOLUTION | Freq: Four times a day (QID) | RESPIRATORY_TRACT | Status: DC | PRN

## 2018-02-06 MED ORDER — VERAPAMIL HCL ER 240 MG PO TBCR
240.0000 mg | EXTENDED_RELEASE_TABLET | Freq: Every day | ORAL | Status: DC
  Administered 2018-02-06 – 2018-02-08 (×3): 240 mg via ORAL
  Filled 2018-02-06 (×3): qty 1

## 2018-02-06 MED ORDER — EPHEDRINE 5 MG/ML INJ
INTRAVENOUS | Status: AC
  Filled 2018-02-06: qty 10

## 2018-02-06 MED ORDER — CANAGLIFLOZIN 100 MG PO TABS
100.0000 mg | ORAL_TABLET | Freq: Every day | ORAL | Status: DC
  Filled 2018-02-06 (×3): qty 1

## 2018-02-06 MED ORDER — LIDOCAINE-EPINEPHRINE 0.5 %-1:200000 IJ SOLN
INTRAMUSCULAR | Status: AC
  Filled 2018-02-06: qty 1

## 2018-02-06 MED ORDER — METFORMIN HCL 500 MG PO TABS
1000.0000 mg | ORAL_TABLET | Freq: Two times a day (BID) | ORAL | Status: DC
  Administered 2018-02-07 – 2018-02-09 (×5): 1000 mg via ORAL
  Filled 2018-02-06 (×5): qty 2

## 2018-02-06 MED ORDER — LIDOCAINE 2% (20 MG/ML) 5 ML SYRINGE
INTRAMUSCULAR | Status: AC
  Filled 2018-02-06: qty 5

## 2018-02-06 MED ORDER — DEXAMETHASONE SODIUM PHOSPHATE 10 MG/ML IJ SOLN
INTRAMUSCULAR | Status: DC | PRN
  Administered 2018-02-06: 6 mg via INTRAVENOUS

## 2018-02-06 MED ORDER — HEMOSTATIC AGENTS (NO CHARGE) OPTIME
TOPICAL | Status: DC | PRN
  Administered 2018-02-06: 1 via TOPICAL

## 2018-02-06 SURGICAL SUPPLY — 66 items
ADH SKN CLS APL DERMABOND .7 (GAUZE/BANDAGES/DRESSINGS) ×1
APL SKNCLS STERI-STRIP NONHPOA (GAUZE/BANDAGES/DRESSINGS)
APL SRG 60D 8 XTD TIP BNDBL (TIP) ×1
BAG DECANTER FOR FLEXI CONT (MISCELLANEOUS) ×2 IMPLANT
BENZOIN TINCTURE PRP APPL 2/3 (GAUZE/BANDAGES/DRESSINGS) IMPLANT
BLADE CLIPPER SURG (BLADE) IMPLANT
BUR MATCHSTICK NEURO 3.0 LAGG (BURR) ×2 IMPLANT
BUR PRECISION FLUTE 5.0 (BURR) ×1 IMPLANT
CABLE BIPOLOR RESECTION CORD (MISCELLANEOUS) ×1 IMPLANT
CANISTER SUCT 3000ML PPV (MISCELLANEOUS) ×2 IMPLANT
CARTRIDGE OIL MAESTRO DRILL (MISCELLANEOUS) ×1 IMPLANT
DECANTER SPIKE VIAL GLASS SM (MISCELLANEOUS) ×2 IMPLANT
DERMABOND ADVANCED (GAUZE/BANDAGES/DRESSINGS) ×1
DERMABOND ADVANCED .7 DNX12 (GAUZE/BANDAGES/DRESSINGS) ×1 IMPLANT
DIFFUSER DRILL AIR PNEUMATIC (MISCELLANEOUS) ×2 IMPLANT
DRAPE LAPAROTOMY 100X72X124 (DRAPES) ×2 IMPLANT
DRAPE MICROSCOPE LEICA (MISCELLANEOUS) ×2 IMPLANT
DRAPE POUCH INSTRU U-SHP 10X18 (DRAPES) ×2 IMPLANT
DRAPE SURG 17X23 STRL (DRAPES) ×2 IMPLANT
DURAPREP 26ML APPLICATOR (WOUND CARE) ×2 IMPLANT
DURASEAL APPLICATOR TIP (TIP) ×1 IMPLANT
DURASEAL SPINE SEALANT 5 POLY (MISCELLANEOUS) ×1 IMPLANT
ELECT REM PT RETURN 9FT ADLT (ELECTROSURGICAL) ×2
ELECTRODE REM PT RTRN 9FT ADLT (ELECTROSURGICAL) ×1 IMPLANT
GAUZE SPONGE 4X4 12PLY STRL (GAUZE/BANDAGES/DRESSINGS) IMPLANT
GAUZE SPONGE 4X4 16PLY XRAY LF (GAUZE/BANDAGES/DRESSINGS) IMPLANT
GLOVE BIO SURGEON STRL SZ 6.5 (GLOVE) ×3 IMPLANT
GLOVE BIOGEL PI IND STRL 6.5 (GLOVE) IMPLANT
GLOVE BIOGEL PI IND STRL 7.5 (GLOVE) IMPLANT
GLOVE BIOGEL PI INDICATOR 6.5 (GLOVE) ×2
GLOVE BIOGEL PI INDICATOR 7.5 (GLOVE) ×1
GLOVE ECLIPSE 6.5 STRL STRAW (GLOVE) ×2 IMPLANT
GLOVE EXAM NITRILE LRG STRL (GLOVE) IMPLANT
GLOVE EXAM NITRILE XL STR (GLOVE) IMPLANT
GLOVE EXAM NITRILE XS STR PU (GLOVE) IMPLANT
GLOVE SS BIOGEL STRL SZ 7.5 (GLOVE) IMPLANT
GLOVE SUPERSENSE BIOGEL SZ 7.5 (GLOVE) ×1
GOWN STRL REUS W/ TWL LRG LVL3 (GOWN DISPOSABLE) ×2 IMPLANT
GOWN STRL REUS W/ TWL XL LVL3 (GOWN DISPOSABLE) IMPLANT
GOWN STRL REUS W/TWL 2XL LVL3 (GOWN DISPOSABLE) IMPLANT
GOWN STRL REUS W/TWL LRG LVL3 (GOWN DISPOSABLE) ×6
GOWN STRL REUS W/TWL XL LVL3 (GOWN DISPOSABLE)
GRAFT DURAGEN MATRIX 1WX1L (Tissue) ×1 IMPLANT
KIT BASIN OR (CUSTOM PROCEDURE TRAY) ×2 IMPLANT
KIT ROOM TURNOVER OR (KITS) ×2 IMPLANT
NDL HYPO 25X1 1.5 SAFETY (NEEDLE) ×1 IMPLANT
NDL SPNL 18GX3.5 QUINCKE PK (NEEDLE) IMPLANT
NEEDLE HYPO 25X1 1.5 SAFETY (NEEDLE) ×2 IMPLANT
NEEDLE SPNL 18GX3.5 QUINCKE PK (NEEDLE) ×2 IMPLANT
NS IRRIG 1000ML POUR BTL (IV SOLUTION) ×2 IMPLANT
OIL CARTRIDGE MAESTRO DRILL (MISCELLANEOUS) ×2
PACK LAMINECTOMY NEURO (CUSTOM PROCEDURE TRAY) ×2 IMPLANT
PAD ARMBOARD 7.5X6 YLW CONV (MISCELLANEOUS) ×6 IMPLANT
RUBBERBAND STERILE (MISCELLANEOUS) ×4 IMPLANT
SPONGE LAP 4X18 X RAY DECT (DISPOSABLE) IMPLANT
SPONGE SURGIFOAM ABS GEL SZ50 (HEMOSTASIS) ×2 IMPLANT
STRIP CLOSURE SKIN 1/2X4 (GAUZE/BANDAGES/DRESSINGS) IMPLANT
SUT BONE WAX W31G (SUTURE) ×1 IMPLANT
SUT PROLENE 6 0 BV (SUTURE) ×1 IMPLANT
SUT VIC AB 0 CT1 18XCR BRD8 (SUTURE) ×1 IMPLANT
SUT VIC AB 0 CT1 8-18 (SUTURE) ×2
SUT VIC AB 2-0 CT1 18 (SUTURE) ×2 IMPLANT
SUT VIC AB 3-0 SH 8-18 (SUTURE) ×2 IMPLANT
TOWEL GREEN STERILE (TOWEL DISPOSABLE) ×2 IMPLANT
TOWEL GREEN STERILE FF (TOWEL DISPOSABLE) ×2 IMPLANT
WATER STERILE IRR 1000ML POUR (IV SOLUTION) ×2 IMPLANT

## 2018-02-06 NOTE — Op Note (Signed)
02/06/2018  10:32 PM  PATIENT:  Vanessa Higgins  67 y.o. female with a large disc herniation at L2/3 eccentric to the right side. She has agreed to undergo operative decompression of the spinal canal.  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 was taken to the operating room, intubated and placed under a general anesthetic without difficulty. She was positioned prone on a Wilson frame with all pressure points padded. Her back was prepped and draped in a sterile manner. I opened the skin with a 10 blade and carried the dissection down to the thoracolumbar fascia. I used both sharp dissection and the monopolar cautery to expose the lamina of L2,, and L3. I confirmed my location with an intraoperative xray.  I used the drill, Kerrison punches, and curettes to perform a hemilaminectomy of L2 bilaterally.. I used the punches to remove the ligamentum flavum to expose the thecal sac. I brought the microscope into the operative field and started the decompression of the spinal canal, thecal sac and L2, and L3 root(s). I cauterized epidural veins overlying the disc space then divided them sharply. I opened the disc space, and the disc herniation with a 15 blade and proceeded with the discectomy. I used pituitary rongeurs, curettes, and other instruments to remove disc material. After the discectomy was completed I inspected the L2 and L3 nerve roots and felt they were well decompressed. I explored rostrally, laterally, medially, and caudally and was satisfied with the decompression. I  irrigated the wound, then closed in layers. I approximated the thoracolumbar fascia, subcutaneous, and subcuticular planes with vicryl sutures. I used dermabond for a sterile dressing. There was an opening to the dura, but no csf leak. I placed duragen over the defect.  PLAN OF CARE: Admit to inpatient   PATIENT DISPOSITION:  PACU - hemodynamically stable.   Delay start of Pharmacological VTE agent (>24hrs) due to surgical blood loss or risk of bleeding:  yes

## 2018-02-06 NOTE — H&P (Signed)
  There were no vitals taken for this visit. Vanessa Higgins comes in today secondary to severe pain she is having in the back and right lower extremity. She awoke on January 26, 2018, with pain in the back that progressed in both territory, radiating into the lower extremity, and in severity. She has never had a problem like this in the past. I have operated on her son. 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 She is 64. VITAL SIGNS: She is 5 feet 4 inches. She weighs 199.8 pounds. Temperature is 98.5, blood pressure is 118/66, pulse is 102, pain is 8/10.  She has had a total knee operation, an ORIF of the left ankle, and a right partial parotidectomy performed. ALLERGIES: She has an allergy to sulfa. MEDICATIONS: Fosfomycin, Hydrocodone, Albuterol, Aspirin, Atorvastatin, Vitamin B, Centrum Silver, Fish Oil, Fluticasone, Salmeterol, Ibuprofen, Jardiance, Loratadine, Losartan, Meclizine, Metformin, Montelukast, Omeprazole, Phenazopyridine, Venlafaxine, Verapamil, Victoza. FAMILY HISTORY: Mother and father are both deceased, ages 54 and 41, pancreatic cancer and a myocardial infarction. Hypertension, brain cancer, and pancreatic cancer run in the family.  She describes right lower extremity pain. Had the pain when she woke up. States getting off the leg will help. Says the pain is 6-7. She has noticed significant weakness in the hip flexors, and it is hard for her to climb stairs. She has numbness and tingling. Weight has been stable. REVIEW OF SYSTEMS: Positive for irregular pulse, hypercholesterolemia, leg pain with walking, urinary tract infections, leg pain. EXAM: She is alert, oriented by 4. Speech is clear; it is also fluent. Pupils equal, round, and react to light. Full extraocular movements. Full visual fields. Symmetric facial sensation and movement. Uvula elevates midline. Shoulder shrug is normal. Tongue protrudes in the midline. 2+ reflexes, biceps, triceps,  brachioradialis, knees, and ankles. She is unable to take a 14-inch step with her right lower extremity. She can do so on the left. She has significant weakness in the right hip flexors on manual exam, 4- over 5. She has normal strength in the left lower extremity. Hamstrings, quadriceps, dorsiflexors, plantar flexors are normal on the right. They are all normal on the left. Normal strength in both upper extremities. Gait is antalgic, favoring the right lower extremity. Proprioception is intact upper and lower extremities.  MRI shows an extraordinarily large disc herniation eccentric to the right side from the 2-3 disc space, that has migrated caudally behind the body of L3. This is deforming the thecal sac and obviously impinging upon the L3 root, and to some degree, the L2 root on the right. The conus is otherwise normal. Cauda equina is normal.  I do believe that Vanessa Higgins would be best served with operative decompression. She already has fairly profound weakness in the right hip flexors, 1 of the biggest group of muscles in the body. She has agreed. We will try to get her on the schedule for this coming Friday. Risks and benefits of the procedure, bleeding, infection, no relief, need for further surgery, nerve damage, weakness in 1 or both lower extremities, bowel and bladder dysfunction, disc recurrence, were discussed. She understands and wishes to proceed.

## 2018-02-06 NOTE — Transfer of Care (Signed)
Immediate Anesthesia Transfer of Care Note  Patient: Vanessa Higgins  Procedure(s) Performed: Lumbar two Hemilaminectomy for Discectomy (Right Back)  Patient Location: PACU  Anesthesia Type:General  Level of Consciousness: awake, alert , oriented and patient cooperative  Airway & Oxygen Therapy: Patient Spontanous Breathing and Patient connected to nasal cannula oxygen  Post-op Assessment: Report given to RN, Post -op Vital signs reviewed and stable and Patient moving all extremities  Post vital signs: Reviewed and stable  Last Vitals:  Vitals:   02/06/18 1315  BP: (!) 158/81  Pulse: (!) 101  Resp: 16  Temp: 36.8 C  SpO2: 94%    Last Pain:  Vitals:   02/06/18 1339  TempSrc:   PainSc: 5       Patients Stated Pain Goal: 1 (76/54/65 0354)  Complications: No apparent anesthesia complications

## 2018-02-06 NOTE — Anesthesia Procedure Notes (Signed)
Procedure Name: Intubation Date/Time: 02/06/2018 5:12 PM Performed by: Myna Bright, CRNA Pre-anesthesia Checklist: Patient identified, Suction available, Emergency Drugs available and Patient being monitored Patient Re-evaluated:Patient Re-evaluated prior to induction Oxygen Delivery Method: Circle system utilized Preoxygenation: Pre-oxygenation with 100% oxygen Induction Type: IV induction Ventilation: Mask ventilation without difficulty Laryngoscope Size: Mac and 3 Grade View: Grade II Tube type: Oral Tube size: 7.0 mm Number of attempts: 1 Airway Equipment and Method: Stylet Placement Confirmation: ETT inserted through vocal cords under direct vision,  positive ETCO2 and breath sounds checked- equal and bilateral Secured at: 21 cm Tube secured with: Tape Dental Injury: Teeth and Oropharynx as per pre-operative assessment

## 2018-02-06 NOTE — Anesthesia Preprocedure Evaluation (Signed)
Anesthesia Evaluation  Patient identified by MRN, date of birth, ID band Patient awake    Reviewed: Allergy & Precautions, H&P , NPO status , Patient's Chart, lab work & pertinent test results  History of Anesthesia Complications (+) PONV and history of anesthetic complications  Airway Mallampati: II   Neck ROM: full    Dental   Pulmonary asthma , sleep apnea ,    breath sounds clear to auscultation       Cardiovascular hypertension,  Rhythm:regular Rate:Normal     Neuro/Psych  Headaches, PSYCHIATRIC DISORDERS Depression    GI/Hepatic GERD  ,  Endo/Other  diabetes, Type 2obese  Renal/GU      Musculoskeletal  (+) Arthritis ,   Abdominal   Peds  Hematology   Anesthesia Other Findings   Reproductive/Obstetrics                             Anesthesia Physical Anesthesia Plan  ASA: III  Anesthesia Plan: General   Post-op Pain Management:    Induction: Intravenous  PONV Risk Score and Plan: 4 or greater and Ondansetron, Treatment may vary due to age or medical condition and Scopolamine patch - Pre-op  Airway Management Planned: Oral ETT  Additional Equipment:   Intra-op Plan:   Post-operative Plan: Extubation in OR  Informed Consent: I have reviewed the patients History and Physical, chart, labs and discussed the procedure including the risks, benefits and alternatives for the proposed anesthesia with the patient or authorized representative who has indicated his/her understanding and acceptance.     Plan Discussed with: CRNA, Anesthesiologist and Surgeon  Anesthesia Plan Comments:         Anesthesia Quick Evaluation

## 2018-02-07 NOTE — Progress Notes (Signed)
Neurosurgery Progress Note  No issues overnight. Back appropriately sore Denies radicular symptoms Remains flat.  EXAM:  BP 140/73 (BP Location: Right Arm)   Pulse 82   Temp 97.6 F (36.4 C) (Oral)   Resp 18   Ht 5\' 4"  (1.626 m)   Wt 94 kg (207 lb 3.7 oz)   SpO2 94%   BMI 35.57 kg/m   Awake, alert, oriented  Speech fluent, appropriate  CN grossly intact  MAEW with good strength Incision flat c/d/i  PLAN Doing well this am. Pain at minimum Per Dr Christella Noa, remain flat until tonight. Slowly raise head of bed and ambulate as tolerated. Monitor for HA.

## 2018-02-07 NOTE — Anesthesia Postprocedure Evaluation (Signed)
Anesthesia Post Note  Patient: Vanessa Higgins  Procedure(s) Performed: Lumbar two Hemilaminectomy for Discectomy (Right Back)     Patient location during evaluation: PACU Anesthesia Type: General Level of consciousness: awake Pain management: pain level controlled Vital Signs Assessment: post-procedure vital signs reviewed and stable Respiratory status: spontaneous breathing Cardiovascular status: stable Anesthetic complications: no    Last Vitals:  Vitals:   02/06/18 2100 02/06/18 2249  BP: (!) 163/73   Pulse: 98 94  Resp: 16 18  Temp: 37 C   SpO2: 97% 93%    Last Pain:  Vitals:   02/07/18 0045  TempSrc:   PainSc: Asleep                 Syreeta Figler

## 2018-02-07 NOTE — Progress Notes (Addendum)
Pt report's not having any urinary out put since 02/06/18 over 12 hours. Nurse will Bladder scan pt and do an in and out cath per order.   @6pm  bladder scan repeated.  pt had residual of 300 cc, pt had no urge to void and had mild pressure,. RN placed back foley.

## 2018-02-08 NOTE — Progress Notes (Signed)
Placed patient on CPAP for the night.  

## 2018-02-08 NOTE — Progress Notes (Signed)
Patient came off bedrest at the said time, [facility] was slowly and progressively elevated with no headaches or complaints. Patient was also able to sit on the side of the bed for half an hour without any pain. Encouraged patient to ambulate first thing in the morning to see how she will. Tolerated this without any distress. Will keep monitoring.Marland Kitchen

## 2018-02-08 NOTE — Progress Notes (Signed)
Patient ambulated with the help of a walker this morning. Tolerated it well.

## 2018-02-08 NOTE — Evaluation (Signed)
Physical Therapy Evaluation Patient Details Name: Vanessa Higgins MRN: [id number] DOB: [birth year] Today's Date: 02/08/2018   History of Present Illness  Pt is a 67 y.o. 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 She underwent laminectomy and decompression 02-06-18.     Clinical Impression  Patient is s/p above surgery resulting in the deficits listed below (see PT Problem List). PTA pt independent with all functional mobility, working outside the home. On eval, pt required supervision bed mobility, transfers and ambulation 200 feet with RW.  Patient will benefit from skilled PT to increase their independence and safety with mobility (while adhering to their precautions) to allow discharge to the venue listed below.  PT to follow acutely to maximize mobility. Possible transition off RW prior to d/c home. Pt does have RW at home, if needed. No follow up services indicated.     Follow Up Recommendations No PT follow up;Supervision - Intermittent    Equipment Recommendations  None recommended by PT    Recommendations for Other Services       Precautions / Restrictions Precautions Precautions: Back Precaution Comments: Educated pt on 3/3 back precautions. Handout provided.       Mobility  Bed Mobility Overal bed mobility: Needs Assistance Bed Mobility: Sidelying to Sit;Sit to Sidelying   Sidelying to sit: Supervision     Sit to sidelying: Supervision General bed mobility comments: verbal cues for logroll  Transfers Overall transfer level: Needs assistance Equipment used: Rolling walker (2 wheeled) Transfers: Sit to/from Stand Sit to Stand: Supervision         General transfer comment: verbal cues for hand placement  Ambulation/Gait Ambulation/Gait assistance: Supervision Ambulation Distance (Feet): 200 Feet Assistive device: Rolling walker (2 wheeled) Gait Pattern/deviations: Step-through pattern;Decreased stride length Gait  velocity: WFL   General Gait Details: steady gait  Stairs            Wheelchair Mobility    Modified Rankin (Stroke Patients Only)       Balance Overall balance assessment: Needs assistance Sitting-balance support: No upper extremity supported;Feet supported Sitting balance-Leahy Scale: Good     Standing balance support: Bilateral upper extremity supported;During functional activity Standing balance-Leahy Scale: Fair                               Pertinent Vitals/Pain Pain Assessment: No/denies pain    Home Living Family/patient expects to be discharged to:: Private residence Living Arrangements: Children(daughter and grandchildren) Available Help at Discharge: Family;Available PRN/intermittently Type of Home: House Home Access: Ramped entrance     Home Layout: One level Home Equipment: Walker - 2 wheels;Shower seat      Prior Function Level of Independence: Independent         Comments: Works outside the home.      Hand Dominance        Extremity/Trunk Assessment   Upper Extremity Assessment Upper Extremity Assessment: Overall WFL for tasks assessed    Lower Extremity Assessment Lower Extremity Assessment: Overall WFL for tasks assessed    Cervical / Trunk Assessment Cervical / Trunk Assessment: Normal  Communication   Communication: No difficulties  Cognition Arousal/Alertness: Awake/alert Behavior During Therapy: WFL for tasks assessed/performed Overall Cognitive Status: Within Functional Limits for tasks assessed  General Comments      Exercises     Assessment/Plan    PT Assessment Patient needs continued PT services  PT Problem List Decreased mobility;Decreased knowledge of precautions;Decreased activity tolerance;Decreased knowledge of use of DME;Decreased balance       PT Treatment Interventions DME instruction;Therapeutic activities;Gait  training;Therapeutic exercise;Patient/family education;Balance training;Functional mobility training    PT Goals (Current goals can be found in the Care Plan section)  Acute Rehab PT Goals Patient Stated Goal: home PT Goal Formulation: With patient Time For Goal Achievement: 02/22/18 Potential to Achieve Goals: Good    Frequency Min 5X/week   Barriers to discharge        Co-evaluation               AM-PAC PT "6 Clicks" Daily Activity  Outcome Measure Difficulty turning over in bed (including adjusting bedclothes, sheets and blankets)?: A Little Difficulty moving from lying on back to sitting on the side of the bed? : A Little Difficulty sitting down on and standing up from a chair with arms (e.g., wheelchair, bedside commode, etc,.)?: A Little Help needed moving to and from a bed to chair (including a wheelchair)?: A Little Help needed walking in hospital room?: A Little Help needed climbing 3-5 steps with a railing? : A Little 6 Click Score: 18    End of Session Equipment Utilized During Treatment: Gait belt Activity Tolerance: Patient tolerated treatment well Patient left: in bed;with call bell/phone within reach Nurse Communication: Mobility status PT Visit Diagnosis: Difficulty in walking, not elsewhere classified (R26.2)    Time: 1601-0932 PT Time Calculation (min) (ACUTE ONLY): 14 min   Charges:   PT Evaluation $PT Eval Low Complexity: 1 Low     PT G Codes:        Lorrin Goodell, PT  Office # [phone] Pager [phone]   Lorriane Shire 02/08/2018, 1:18 PM

## 2018-02-08 NOTE — Progress Notes (Addendum)
Neurosurgery Progress Note  Urinary retention requiring foley yesterday evening. No other issues reported. She has been ambulating without difficulties.  Only pain is incisional "soreness" Denies positional headache  EXAM:  BP (!) 122/54 (BP Location: Right Arm) Comment: map 68  Pulse 87   Temp 98.6 F (37 C) (Oral)   Resp 16   Ht 5\' 4"  (1.626 m)   Wt 94 kg (207 lb 3.7 oz)   SpO2 (!) 88% Comment: took off CPAP  BMI 35.57 kg/m   Awake, alert, oriented  Speech fluent, appropriate  MAEW with good strength Incision flat, c/d/i  PLAN Stable this am with exception of urinary retention overnight requiring foley placement. Will continue bladder rest. Remove Foley tomorrow am. Work with therapy today. If HA develops with ambulation, lay flat Hopefully discharge tomorrow.

## 2018-02-09 ENCOUNTER — Encounter (HOSPITAL_COMMUNITY): Payer: Self-pay | Admitting: Neurosurgery

## 2018-02-09 NOTE — Progress Notes (Signed)
Physical Therapy Treatment Patient Details Name: Vanessa Higgins MRN: [id number] DOB: March 11, 1951 Today's Date: 02/09/2018    History of Present Illness Pt is a 67 y.o. 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 She underwent laminectomy and decompression 02-06-18.     PT Comments    Patient progressing well towards PT goals. Tolerated gait training without use of RW today and balance looked good. Reports no pain or headache. Tolerated bed mobility simulating bed at home with HOB flat, no rails and demonstrating good log roll technique. Reviewed back precautions as pt only able to recall 2/3. Discussed how to safely get in/out of car. Pt plans to d/c home today. All goals have been met. All education completed. Encouraged ambulation while in hospital. Discharge from therapy.  Follow Up Recommendations  No PT follow up;Supervision - Intermittent     Equipment Recommendations  None recommended by PT    Recommendations for Other Services       Precautions / Restrictions Precautions Precautions: Back Precaution Booklet Issued: Yes (comment) Precaution Comments: Able to recall 2/3 back precautions Restrictions Weight Bearing Restrictions: No    Mobility  Bed Mobility Overal bed mobility: Needs Assistance Bed Mobility: Rolling;Sidelying to Sit;Sit to Sidelying Rolling: Modified independent (Device/Increase time) Sidelying to sit: Modified independent (Device/Increase time)     Sit to sidelying: Modified independent (Device/Increase time) General bed mobility comments: HOB flat, no use of rails to simulate home. Demonstrates good log roll technique.   Transfers Overall transfer level: Modified independent Equipment used: None Transfers: Sit to/from Stand Sit to Stand: Modified independent (Device/Increase time)         General transfer comment: Stood from chair without difficulty and from toilet x1.   Ambulation/Gait Ambulation/Gait  assistance: Supervision Ambulation Distance (Feet): 250 Feet Assistive device: None Gait Pattern/deviations: Step-through pattern;Decreased stride length   Gait velocity interpretation: at or above normal speed for age/gender General Gait Details: Slow, steady gait without use of RW today. MIld SOB but pt reports this as premorbid.   Stairs            Wheelchair Mobility    Modified Rankin (Stroke Patients Only)       Balance Overall balance assessment: Needs assistance Sitting-balance support: Feet supported;No upper extremity supported Sitting balance-Leahy Scale: Good     Standing balance support: During functional activity Standing balance-Leahy Scale: Good Standing balance comment: Able to stand at sink and perform hand hygiene and change gown in standing without difficulty or LOB.                            Cognition Arousal/Alertness: Awake/alert Behavior During Therapy: WFL for tasks assessed/performed Overall Cognitive Status: Within Functional Limits for tasks assessed                                        Exercises      General Comments        Pertinent Vitals/Pain Pain Assessment: No/denies pain    Home Living                      Prior Function            PT Goals (current goals can now be found in the care plan section) Progress towards PT goals: Goals met/education completed, patient  discharged from PT    Frequency    Min 5X/week      PT Plan Current plan remains appropriate    Co-evaluation              AM-PAC PT "6 Clicks" Daily Activity  Outcome Measure  Difficulty turning over in bed (including adjusting bedclothes, sheets and blankets)?: None Difficulty moving from lying on back to sitting on the side of the bed? : None Difficulty sitting down on and standing up from a chair with arms (e.g., wheelchair, bedside commode, etc,.)?: None Help needed moving to and from a bed to  chair (including a wheelchair)?: None Help needed walking in hospital room?: None Help needed climbing 3-5 steps with a railing? : A Little 6 Click Score: 23    End of Session   Activity Tolerance: Patient tolerated treatment well Patient left: in bed;with call bell/phone within reach Nurse Communication: Mobility status PT Visit Diagnosis: Difficulty in walking, not elsewhere classified (R26.2)     Time: 9179-1505 PT Time Calculation (min) (ACUTE ONLY): 16 min  Charges:  $Gait Training: 8-22 mins                    G Codes:       Wray Kearns, [place], DPT [place] 02/09/2018, 9:05 AM

## 2018-02-09 NOTE — Care Management Note (Signed)
Case Management Note  Patient Details  Name: Vanessa Higgins MRN: [id number] Date of Birth: 17-May-1951  Subjective/Objective:                    Action/Plan: Pt discharging home with self care.  Pt lives with her daughter that can provide intermittent supervision.  Pt has PCP, insurance and transportation home. No further needs per CM.  Expected Discharge Date:  02/09/18               Expected Discharge Plan:  Home/Self Care  In-House Referral:     Discharge planning Services     Post Acute Care Choice:    Choice offered to:     DME Arranged:    DME Agency:     HH Arranged:    HH Agency:     Status of Service:  Completed, signed off  If discussed at [employer] of Stay Meetings, dates discussed:    Additional Comments:  Pollie Friar, RN 02/09/2018, 12:43 PM

## 2018-02-10 ENCOUNTER — Encounter: Payer: Self-pay | Admitting: *Deleted

## 2018-02-10 ENCOUNTER — Other Ambulatory Visit: Payer: Self-pay | Admitting: *Deleted

## 2018-02-10 NOTE — Patient Outreach (Addendum)
[facility]) Care Management  02/10/2018  Vanessa Higgins [birth year] [id number]  Subjective: Telephone call to patient's home number, spoke with patient, and HIPAA verified.  Discussed [facility] Management [facility] Transition of care follow up, patient voiced understanding, and is in agreement to follow up.  Patient states she is feeling great, has no pain in right leg, and will call surgeon for follow up appointment.  Patient states she is able to manage self care and has assistance as needed with activities of daily living / home management.  Patient voices understanding of medical diagnosis, surgery, and treatment plan.  States she is accessing the following [facility] benefits: outpatient pharmacy, hospital indemnity (verbally given contact number for [employer] [phone], will file claim if appropriate, verbally given contact number for [employer] [phone] to request itemized bill), verbally given contact number to [facility] Benefit Specialist [phone]), will call to verify other benefits, and  family medical leave act (FMLA) in place. Discussed diabetes disease management resources for [facility] employees and advised RNCM will send web sites in the successful outreach letter.   States she has been active with [facility] pharmacist in the past and was not eligible for [employer] program due to phone limitations. Patient states she does not have any education material, transition of care, care coordination,  transportation, community resource, or pharmacy needs at this time.  States she is very appreciative of the follow up and is in agreement to receive [place] Management information.      Objective: Per KPN (Knowledge Performance Now, point of care tool) and chart review, patient hospitalized 02/06/18 - 02/09/18 for [place], LUMBAR2/3.   Status post Lumbar two Hemilaminectomy for Discectomy on 02/06/18.  Patient also has a history of diabetes, hypertension, asthma, and  sleep apnea (wears CPAP).       Assessment:  Received [facility]MR Transition of care referral on 02/09/18.  Transition of care follow up completed, no care management needs, and will proceed with case closure.      Plan: RNCM will send patient successful outreach letter, [facility] pamphlet, and magnet. RNCM will send case closure due to follow up completed / no care management needs request to [facility].       Colleen Kotlarz H. Annia Friendly, BSN, [place] [facility] Telephonic CM Phone: [phone] Fax: [phone]

## 2018-02-19 ENCOUNTER — Encounter: Payer: Self-pay | Admitting: Family Medicine

## 2018-02-19 ENCOUNTER — Ambulatory Visit: Payer: 59 | Admitting: Family Medicine

## 2018-02-19 VITALS — BP 110/84 | HR 81 | Temp 98.2°F | Resp 16 | Wt 199.1 lb

## 2018-02-19 DIAGNOSIS — N3090 Cystitis, unspecified without hematuria: Secondary | ICD-10-CM | POA: Diagnosis not present

## 2018-02-19 DIAGNOSIS — R350 Frequency of micturition: Secondary | ICD-10-CM

## 2018-02-19 LAB — POC URINALSYSI DIPSTICK (AUTOMATED)
Blood, UA: 10
Glucose, UA: NEGATIVE
Ketones, UA: NEGATIVE
Nitrite, UA: POSITIVE
Protein, UA: 1
Spec Grav, UA: 1.025 (ref 1.010–1.025)
Urobilinogen, UA: 1 E.U./dL
pH, UA: 6 (ref 5.0–8.0)

## 2018-02-19 LAB — URINALYSIS, MICROSCOPIC ONLY: RBC / HPF: NONE SEEN (ref 0–?)

## 2018-02-19 MED ORDER — AMOXICILLIN-POT CLAVULANATE 875-125 MG PO TABS: 1.0000 | ORAL_TABLET | Freq: Two times a day (BID) | ORAL | 0 refills | Status: DC

## 2018-02-19 NOTE — Progress Notes (Signed)
Patient ID: Vanessa Higgins, female   DOB: Feb 28, 1951, 67 y.o.   MRN: [id number]    PCP: Burnard Hawthorne, FNP  Subjective:  Vanessa Higgins is a 67 y.o. year old very pleasant female patient who presents with Urinary Tract symptoms: symptoms including dysuria,frequency, nocturia, and urgency. -started: 1 week ago, symptoms are not improvng  -previous treatments: None Fasting blood sugar ranges from 110 to 120s. -On 02/01/18, she was provided fosfomycin 3 gram for 2 doses related to bacteria in her urine per patient during her hospital stay for back surgery on 02/06/18. Symptoms improved however symptoms present over the past week.  ROS-denies fever, chills, sweats, N/V, flank pain, or blood in urine  Pertinent Past Medical History- HTN, T2DM, GERD, history of UTIs where she has been followed by urology,    Medications- reviewed  Current Outpatient Medications  Medication Sig Dispense Refill  . albuterol (PROVENTIL HFA;VENTOLIN HFA) 108 (90 Base) MCG/ACT inhaler Inhale 2 puffs into the lungs every 6 (six) hours as needed for wheezing or shortness of breath.    Marland Kitchen aspirin 81 MG tablet Take 81 mg by mouth daily.    Marland Kitchen atorvastatin (LIPITOR) 10 MG tablet Take 10 mg by mouth at bedtime.     Marland Kitchen b complex vitamins tablet Take 1 tablet by mouth daily.    . Fluticasone-Salmeterol (ADVAIR DISKUS) 250-50 MCG/DOSE AEPB INHALE 1 PUFF INTO THE LUNGS 2 TIMES DAILY. 60 each 6  . gabapentin (NEURONTIN) 300 MG capsule Take 300 mg by mouth 3 (three) times daily.  1  . JARDIANCE 10 MG TABS tablet Take 10 mg by mouth daily.   5  . liraglutide (VICTOZA) 18 MG/3ML SOPN Inject 1.8 mg by mouth at bedtime    . loratadine (CLARITIN) 10 MG tablet Take 10 mg by mouth daily.    Marland Kitchen losartan (COZAAR) 25 MG tablet TAKE 1 TABLET BY MOUTH DAILY (Patient taking differently: Take 25 mg by mouth daily) 90 tablet 1  . meclizine (ANTIVERT) 25 MG tablet Take 1 tablet (25 mg total) by mouth 3 (three) times daily as needed for dizziness.  30 tablet 0  . metFORMIN (GLUCOPHAGE) 1000 MG tablet Take 1 tablet (1,000 mg total) by mouth 2 (two) times daily with a meal. 60 tablet 5  . montelukast (SINGULAIR) 10 MG tablet Take 1 tablet (10 mg total) by mouth at bedtime. 90 tablet 3  . Multiple Vitamins-Minerals (CENTRUM SILVER PO) Take 1 tablet by mouth daily.     Marland Kitchen omeprazole (PRILOSEC) 20 MG capsule TAKE 1 CAPSULE (20 MG TOTAL) BY MOUTH DAILY. 90 capsule 3  . venlafaxine XR (EFFEXOR-XR) 150 MG 24 hr capsule TAKE 1 CAPSULE BY MOUTH DAILY WITH BREAKFAST. (Patient taking differently: TAKE 150 MG BY MOUTH DAILY WITH BREAKFAST.) 90 capsule 0  . Verapamil HCl CR 300 MG CP24 Take 1 capsule (300 mg total) by mouth daily. (Patient taking differently: Take 1 capsule by mouth at bedtime. ) 90 capsule 3  . HYDROcodone-acetaminophen (NORCO) 5-325 MG tablet Take 1-2 tablets by mouth every 6 (six) hours as needed for severe pain. (Patient not taking: Reported on 02/10/2018) 16 tablet 0  . ibuprofen (ADVIL,MOTRIN) 800 MG tablet Take 800 mg by mouth every 8 (eight) hours as needed for moderate pain.     . Menthol, Topical Analgesic, (BIOFREEZE EX) Apply 1 application topically daily.    . phenazopyridine (PYRIDIUM) 200 MG tablet Take 1 tablet (200 mg total) by mouth 3 (three) times daily as needed for pain. (  Patient not taking: Reported on 02/19/2018) 6 tablet 0   No current facility-administered medications for this visit.     Objective: BP 110/84 (BP Location: Left Arm, Patient Position: Sitting, Cuff Size: Large)   Pulse 81   Temp 98.2 F (36.8 C) (Oral)   Resp 16   Wt 199 lb 2 oz (90.3 kg)   SpO2 95%   BMI 34.18 kg/m  Gen: NAD, resting comfortably HEENT: clear and moist CV: RRR no murmurs rubs or gallops Lungs: CTAB no crackles, wheeze, rhonchi Abdomen: soft/nontender/nondistended/normal bowel sounds. No rebound or guarding.  No CVA tenderness.   Suprapubic tenderness present Ext: no edema Skin: warm, dry, no rash Neuro: grossly normal,  moves all extremities  Assessment/Plan: 1. Cystitis  - amoxicillin-clavulanate (AUGMENTIN) 875-125 MG tablet; Take 1 tablet by mouth 2 (two) times daily.  Dispense: 20 tablet; Refill: 0  2. Urinary frequency  - POCT Urinalysis Dipstick (Automated) - Urine Culture - Urine Microscopic   UA indicates 3+ leukocytes, +nitrites.  History and exam today are suggestive of UTI.  Will send culture. Empiric treatment with Augmentin today.  Advised patient to complete antibiotic and she should follow up if her symptoms do not improve in 2 to 3 days, worsen, she develops a fever >101, or back pain. Also advised her to force fluids and she can use OTC Azo for symptom relief temporarily if needed. Patient voiced understanding and agreed with plan.  Further advised her that it is time for a follow up with her PCP. She agreed to schedule this appointment today.   Finally, we reviewed reasons to return to care including if symptoms worsen or persist or new concerns arise- once again particularly fever, N/V, or flank pain.   [name], FNP

## 2018-02-19 NOTE — Patient Instructions (Signed)
Please complete antibiotic as directed. If you develop symptoms of fever >101, pain in your back, or do not improve with treatment that has been provided in 3 to 4 days, please follow up for further evaluation and treatment.   Urinary Tract Infection, Adult A urinary tract infection (UTI) is an infection of any part of the urinary tract. The urinary tract includes the:  Kidneys.  Ureters.  Bladder.  Urethra.  These organs make, store, and get rid of pee (urine) in the body. Follow these instructions at home:  Take over-the-counter and prescription medicines only as told by your doctor.  If you were prescribed an antibiotic medicine, take it as told by your doctor. Do not stop taking the antibiotic even if you start to feel better.  Avoid the following drinks: ? Alcohol. ? Caffeine. ? Tea. ? Carbonated drinks.  Drink enough fluid to keep your pee clear or pale yellow.  Keep all follow-up visits as told by your doctor. This is important.  Make sure to: ? Empty your bladder often and completely. Do not to hold pee for long periods of time. ? Empty your bladder before and after sex. ? Wipe from front to back after a bowel movement if you are female. Use each tissue one time when you wipe. Contact a doctor if:  You have back pain.  You have a fever.  You feel sick to your stomach (nauseous).  You throw up (vomit).  Your symptoms do not get better after 3 days.  Your symptoms go away and then come back. Get help right away if:  You have very bad back pain.  You have very bad lower belly (abdominal) pain.  You are throwing up and cannot keep down any medicines or water. This information is not intended to replace advice given to you by your health care provider. Make sure you discuss any questions you have with your health care provider. Document Released: 06/03/2008 Document Revised: 05/23/2016 Document Reviewed: 11/06/2015 Elsevier Interactive Patient Education   2018 [employer]  

## 2018-02-21 LAB — URINE CULTURE
MICRO NUMBER:: 90230303
SPECIMEN QUALITY:: ADEQUATE

## 2018-02-24 ENCOUNTER — Other Ambulatory Visit: Payer: 59

## 2018-02-24 NOTE — Discharge Summary (Signed)
Physician Discharge Summary  Patient ID: Vanessa Higgins MRN: [id number] DOB/AGE: 1951-03-07 67 y.o.  Admit date: 02/06/2018 Discharge date: 02/24/2018  Admission Diagnoses:HNP lumbar  Discharge Diagnoses: [place], LUMBAR2/3   Active Problems:   HNP (herniated nucleus pulposus), lumbar   Discharged Condition: good  Hospital Course: Vanessa Higgins was admitted to the hospital and taken to the operating room for an uncomplicated lumbar discetomy at L2/3 on the right. Post op she is ambulating, voiding, and tolerating a regular diet. Wound is clean,d ry, no signs of infection Treatments: surgery: Lumbar two Hemilaminectomy for Discectomy     Discharge Exam: Blood pressure (!) 117/51, pulse 89, temperature 98.5 F (36.9 C), temperature source Oral, resp. rate 18, height 5\' 4"  (1.626 m), weight 94 kg (207 lb 3.7 oz), SpO2 93 %. General appearance: alert, cooperative, appears stated age and no distress  Disposition: 01-Home or Self Care DISC DISPLACEMENT, LUMBAR Discharge Instructions    Diet - low sodium heart healthy   Complete by:  As directed    Discharge instructions   Complete by:  As directed    Lumbar Discectomy Care After A discectomy involves removal of discmaterial (the cartilage-like structures located between the bones of the back). It is done to relieve pressure on nerve roots. It can be used as a treatment for a back problem. The time in surgery depends on the findings in surgery and what is necessary to correct the problems. HOME CARE INSTRUCTIONS  Check the cut (incision) made by the surgeon twice a day for signs of infection. Some signs of infection may include:  A foul smelling, greenish or yellowish discharge from the wound.  Increased pain.  Increased redness over the incision (operative) site.  The skin edges may separate.  Flu-like symptoms (problems).  A temperature above 101.5 F (38.6 C).  Change your bandages in about 24 to 36 hours following  surgery or as directed.  You may shower tomrrow.  Avoid bathtubs, swimming pools and hot tubs for three weeks or until your incision has healed completely. Follow your doctor's instructions as to safe activities, exercises, and physical therapy.  Weight reduction may be beneficial if you are overweight.  Daily exercise is helpful to prevent the return of problems. Walking is permitted. You may use a treadmill without an incline. Cut down on activities and exercise if you have discomfort. You may also go up and down stairs as much as you can tolerate.  DO NOT lift anything heavier than 10 to 15 lbs. Avoid bending or twisting at the waist. Always bend your knees when lifting.  Maintain strength and range of motion as instructed.  Do not drive for 10 days, or as directed by your doctors. You may be a passenger . Lying back in the passenger seat may be more comfortable for you. Always wear a seatbelt.  Limit your sitting in a regular chair to 20 to 30 minutes at a time. There are no limitations for sitting in a recliner. You should lie down or walk in between sitting periods.  Only take over-the-counter or prescription medicines for pain, discomfort, or fever as directed by your caregiver.  SEEK MEDICAL CARE IF:  There is increased bleeding (more than a small spot) from the wound.  You notice redness, swelling, or increasing pain in the wound.  Pus is coming from wound.  You develop an unexplained oral temperature above 102 F (38.9 C) develops.  You notice a foul smell coming from the wound or dressing.  You have increasing pain in your wound.  SEEK IMMEDIATE MEDICAL CARE IF:  You develop a rash.  You have difficulty breathing.  You develop any allergic problems to medicines given.  Document Released: 11/20/2004 Document Revised: 12/05/2011 Document Reviewed: 03/10/2008 ExitCare Patient Information   Increase activity slowly   Complete by:  As directed      Allergies as of 02/09/2018       Reactions   Sulfa Antibiotics Hives, Shortness Of Breath, Rash   Ramipril Cough      Medication List    TAKE these medications   albuterol 108 (90 Base) MCG/ACT inhaler Commonly known as:  PROVENTIL HFA;VENTOLIN HFA Inhale 2 puffs into the lungs every 6 (six) hours as needed for wheezing or shortness of breath.   aspirin 81 MG tablet Take 81 mg by mouth daily.   atorvastatin 10 MG tablet Commonly known as:  LIPITOR Take 10 mg by mouth at bedtime.   b complex vitamins tablet Take 1 tablet by mouth daily.   BIOFREEZE EX Apply 1 application topically daily.   CENTRUM SILVER PO Take 1 tablet by mouth daily.   Fluticasone-Salmeterol 250-50 MCG/DOSE Aepb Commonly known as:  ADVAIR DISKUS INHALE 1 PUFF INTO THE LUNGS 2 TIMES DAILY.   gabapentin 300 MG capsule Commonly known as:  NEURONTIN Take 300 mg by mouth 3 (three) times daily.   HYDROcodone-acetaminophen 5-325 MG tablet Commonly known as:  NORCO Take 1-2 tablets by mouth every 6 (six) hours as needed for severe pain.   ibuprofen 800 MG tablet Commonly known as:  ADVIL,MOTRIN Take 800 mg by mouth every 8 (eight) hours as needed for moderate pain.   JARDIANCE 10 MG Tabs tablet Generic drug:  empagliflozin Take 10 mg by mouth daily.   loratadine 10 MG tablet Commonly known as:  CLARITIN Take 10 mg by mouth daily.   losartan 25 MG tablet Commonly known as:  COZAAR TAKE 1 TABLET BY MOUTH DAILY What changed:    how much to take  how to take this  when to take this   meclizine 25 MG tablet Commonly known as:  ANTIVERT Take 1 tablet (25 mg total) by mouth 3 (three) times daily as needed for dizziness.   metFORMIN 1000 MG tablet Commonly known as:  GLUCOPHAGE Take 1 tablet (1,000 mg total) by mouth 2 (two) times daily with a meal.   montelukast 10 MG tablet Commonly known as:  SINGULAIR Take 1 tablet (10 mg total) by mouth at bedtime.   omeprazole 20 MG capsule Commonly known as:  PRILOSEC TAKE 1  CAPSULE (20 MG TOTAL) BY MOUTH DAILY.   phenazopyridine 200 MG tablet Commonly known as:  PYRIDIUM Take 1 tablet (200 mg total) by mouth 3 (three) times daily as needed for pain.   venlafaxine XR 150 MG 24 hr capsule Commonly known as:  EFFEXOR-XR TAKE 1 CAPSULE BY MOUTH DAILY WITH BREAKFAST. What changed:  See the new instructions.   Verapamil HCl CR 300 MG Cp24 Take 1 capsule (300 mg total) by mouth daily. What changed:  when to take this   VICTOZA 18 MG/3ML Sopn Generic drug:  liraglutide Inject 1.8 mg by mouth at bedtime      Follow-up Information    Ashok Pall, MD. Call in 3 week(s).   Specialty:  Neurosurgery Why:  please call to make an appointment.  Contact information: [address] [place] [address] [phone]  Signed: Devonte Migues L 02/24/2018, 7:36 PM

## 2018-03-10 ENCOUNTER — Other Ambulatory Visit: Payer: Self-pay | Admitting: Family

## 2018-03-11 ENCOUNTER — Inpatient Hospital Stay: Admit: 2018-03-11 | Payer: 59 | Admitting: Orthopedic Surgery

## 2018-03-11 SURGERY — ARTHROPLASTY, KNEE, TOTAL, USING IMAGELESS COMPUTER-ASSISTED NAVIGATION
Anesthesia: Choice | Laterality: Right

## 2018-03-13 ENCOUNTER — Encounter: Payer: Self-pay | Admitting: Family

## 2018-03-13 ENCOUNTER — Ambulatory Visit: Payer: 59 | Admitting: Family

## 2018-03-13 VITALS — BP 114/72 | HR 72 | Temp 97.8°F | Ht 64.0 in | Wt 197.8 lb

## 2018-03-13 DIAGNOSIS — E119 Type 2 diabetes mellitus without complications: Secondary | ICD-10-CM

## 2018-03-13 DIAGNOSIS — I10 Essential (primary) hypertension: Secondary | ICD-10-CM | POA: Diagnosis not present

## 2018-03-13 DIAGNOSIS — K760 Fatty (change of) liver, not elsewhere classified: Secondary | ICD-10-CM

## 2018-03-13 DIAGNOSIS — K219 Gastro-esophageal reflux disease without esophagitis: Secondary | ICD-10-CM

## 2018-03-13 LAB — COMPREHENSIVE METABOLIC PANEL
ALT: 21 U/L (ref 0–35)
AST: 19 U/L (ref 0–37)
Albumin: 4.3 g/dL (ref 3.5–5.2)
Alkaline Phosphatase: 42 U/L (ref 39–117)
BUN: 12 mg/dL (ref 6–23)
CO2: 27 mEq/L (ref 19–32)
Calcium: 9.7 mg/dL (ref 8.4–10.5)
Chloride: 102 mEq/L (ref 96–112)
Creatinine, Ser: 0.53 mg/dL (ref 0.40–1.20)
GFR: 121.71 mL/min (ref >60.00)
Glucose, Bld: 117 mg/dL — ABNORMAL HIGH (ref 70–99)
Potassium: 3.8 mEq/L (ref 3.5–5.1)
Sodium: 140 mEq/L (ref 135–145)
Total Bilirubin: 1 mg/dL (ref 0.2–1.2)
Total Protein: 7.2 g/dL (ref 6.0–8.3)

## 2018-03-13 LAB — HEMOGLOBIN A1C: Hgb A1c MFr Bld: 6.9 % — ABNORMAL HIGH (ref 4.6–6.5)

## 2018-03-13 NOTE — Patient Instructions (Addendum)
Mammogram due next month- please schedule :)   Labs today

## 2018-03-13 NOTE — Assessment & Plan Note (Signed)
Suspect controlled.  Pending A1c

## 2018-03-13 NOTE — Assessment & Plan Note (Signed)
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  She discussed with him her history of fatty liver disease; per patient, they jointly decided no further testing at this time.  Per patient, lifestyle modifications were discussed.  I will continue to follow patient as well with serial liver enzymes and lifestyle modifications.

## 2018-03-13 NOTE — Assessment & Plan Note (Signed)
Controlled with as needed Zantac.  Will follow.

## 2018-03-13 NOTE — Progress Notes (Signed)
Subjective:    Patient ID: Vanessa Higgins, female    DOB: 01-Apr-1951, 67 y.o.   MRN: [id number]  CC: Vanessa Higgins is a 67 y.o. female who presents today for follow up.   HPI: HTN- compliant with medication. Denies exertional chest pain or pressure, numbness or tingling radiating to left arm or jaw, palpitations, dizziness, frequent headaches, changes in vision, or shortness of breath.   DM- FBG 120-144 Compliant with medications.   GERD- no longer on prilosec. Takes zantac QD if needed.   NAFLD- Works with Dr Tiffany Kocher and he spoke with her about fatty liver disease. Discussed with her lifestyle modifications. Declines actual appointment with GI.   Recent UTI, no blood seen on micro.  Symptoms resolved. Completed augmentin.   Recent low back surgery, doing great. 01/2018   DEXA 2017 osteopenia; prefers to wait another year before having screen.  Pap 2016- normal pap smear per patient last 10 years HISTORY:  Past Medical History:  Diagnosis Date  . Allergy    hay fever  . Ankle fracture, left 04/08/2014  . Arthritis   . Asthma   . Blood transfusion abn reaction or complication, no procedure mishap   . Depression   . Diabetes mellitus   . Disc displacement, lumbar   . Fatty liver   . GERD (gastroesophageal reflux disease)   . Headache(784.0)   . Hyperlipidemia   . Hypertension   . Migraine   . Obstructive sleep apnea    CPAP  . PONV (postoperative nausea and vomiting)   . Sleep apnea 04/06/2017  . UTI (lower urinary tract infection)   . Wears glasses    Past Surgical History:  Procedure Laterality Date  . APPENDECTOMY  1977  . COLONOSCOPY    . LUMBAR LAMINECTOMY/DECOMPRESSION MICRODISCECTOMY Right 02/06/2018   Procedure: Lumbar two Hemilaminectomy for Discectomy;  Surgeon: Ashok Pall, MD;  Location: [facility];  Service: Neurosurgery;  Laterality: Right;  . ORIF ANKLE FRACTURE Left 04/08/2014   Procedure: OPEN REDUCTION INTERNAL FIXATION (ORIF) ANKLE FRACTURE LEFT ANKLE  FRACTURE OPEN TREATMENT BIMALLEOLAR ANKLE INCLUDES INTERNAL FIXATION;  Surgeon: Johnny Bridge, MD;  Location: [facility];  Service: Orthopedics;  Laterality: Left;  . PAROTIDECTOMY  1980   left  . TONSILLECTOMY AND ADENOIDECTOMY     as a child  . 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 TUBAL LIGATION  05/1976   Family History  Problem Relation Age of Onset  . Sudden death Mother   . Cancer Mother 61       pancreatic  . Heart disease Father 35  . Sudden death Father   . Cancer Brother        colon  . Stroke Maternal Grandmother   . Heart disease Maternal Grandfather   . Cancer Sister        brain  . Breast cancer Neg Hx     Allergies: Sulfa antibiotics and Ramipril Current Outpatient Medications on File Prior to Visit  Medication Sig Dispense Refill  . albuterol (PROVENTIL HFA;VENTOLIN HFA) 108 (90 Base) MCG/ACT inhaler Inhale 2 puffs into the lungs every 6 (six) hours as needed for wheezing or shortness of breath.    Marland Kitchen aspirin 81 MG tablet Take 81 mg by mouth daily.    Marland Kitchen atorvastatin (LIPITOR) 10 MG tablet Take 10 mg by mouth at bedtime.     Marland Kitchen b complex vitamins tablet Take 1 tablet by mouth daily.    . Fluticasone-Salmeterol (ADVAIR  DISKUS) 250-50 MCG/DOSE AEPB INHALE 1 PUFF INTO THE LUNGS 2 TIMES DAILY. 60 each 6  . gabapentin (NEURONTIN) 300 MG capsule Take 300 mg by mouth 3 (three) times daily.  1  . ibuprofen (ADVIL,MOTRIN) 800 MG tablet Take 800 mg by mouth every 8 (eight) hours as needed for moderate pain.     Marland Kitchen JARDIANCE 10 MG TABS tablet Take 10 mg by mouth daily.   5  . liraglutide (VICTOZA) 18 MG/3ML SOPN Inject 1.8 mg by mouth at bedtime    . loratadine (CLARITIN) 10 MG tablet Take 10 mg by mouth daily.    Marland Kitchen losartan (COZAAR) 25 MG tablet TAKE 1 TABLET BY MOUTH DAILY (Patient taking differently: Take 25 mg by mouth daily) 90 tablet 1  . meclizine (ANTIVERT) 25 MG tablet Take 1 tablet (25 mg total) by mouth 3 (three) times daily as needed for  dizziness. 30 tablet 0  . Menthol, Topical Analgesic, (BIOFREEZE EX) Apply 1 application topically daily.    . metFORMIN (GLUCOPHAGE) 1000 MG tablet Take 1 tablet (1,000 mg total) by mouth 2 (two) times daily with a meal. 60 tablet 5  . montelukast (SINGULAIR) 10 MG tablet Take 1 tablet (10 mg total) by mouth at bedtime. 90 tablet 3  . Multiple Vitamins-Minerals (CENTRUM SILVER PO) Take 1 tablet by mouth daily.     Marland Kitchen omeprazole (PRILOSEC) 20 MG capsule TAKE 1 CAPSULE (20 MG TOTAL) BY MOUTH DAILY. 90 capsule 3  . phenazopyridine (PYRIDIUM) 200 MG tablet Take 1 tablet (200 mg total) by mouth 3 (three) times daily as needed for pain. 6 tablet 0  . venlafaxine XR (EFFEXOR-XR) 150 MG 24 hr capsule TAKE 1 CAPSULE BY MOUTH DAILY WITH BREAKFAST. 90 capsule 0  . Verapamil HCl CR 300 MG CP24 Take 1 capsule (300 mg total) by mouth daily. (Patient taking differently: Take 1 capsule by mouth at bedtime. ) 90 capsule 3   No current facility-administered medications on file prior to visit.     Social History   Tobacco Use  . Smoking status: Never Smoker  . Smokeless tobacco: Never Used  Substance Use Topics  . Alcohol use: No  . Drug use: No    Review of Systems  Constitutional: Negative for chills and fever.  Respiratory: Negative for cough.   Cardiovascular: Negative for chest pain and palpitations.  Gastrointestinal: Negative for nausea and vomiting.      Objective:    BP 114/72 (BP Location: Left Arm, Patient Position: Sitting, Cuff Size: Normal)   Pulse 72   Temp 97.8 F (36.6 C) (Oral)   Ht 5\' 4"  (1.626 m)   Wt 197 lb 12.8 oz (89.7 kg)   SpO2 94%   BMI 33.95 kg/m  BP Readings from Last 3 Encounters:  03/13/18 114/72  02/19/18 110/84  02/09/18 (!) 117/51   Wt Readings from Last 3 Encounters:  03/13/18 197 lb 12.8 oz (89.7 kg)  02/19/18 199 lb 2 oz (90.3 kg)  02/06/18 207 lb 3.7 oz (94 kg)    Physical Exam  Constitutional: She appears well-developed and well-nourished.    Eyes: Conjunctivae are normal.  Cardiovascular: Normal rate, regular rhythm, normal heart sounds and normal pulses.  Pulmonary/Chest: Effort normal and breath sounds normal. She has no wheezes. She has no rhonchi. She has no rales.  Neurological: She is alert.  Skin: Skin is warm and dry.  Psychiatric: She has a normal mood and affect. Her speech is normal and behavior is normal. Thought content normal.  Vitals reviewed.      Assessment & Plan:   Problem List Items Addressed This Visit      Cardiovascular and Mediastinum   Hypertension - Primary    Well-controlled.  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  She discussed with him her history of fatty liver disease; per patient, they jointly decided no further testing at this time.  Per patient, lifestyle modifications were discussed.  I will continue to follow patient as well with serial liver enzymes and lifestyle modifications.       Relevant Orders   Comprehensive metabolic panel   GERD (gastroesophageal reflux disease)    Controlled with as needed Zantac.  Will follow.        Endocrine   Diabetes mellitus type 2, controlled ([place])    Suspect controlled.  Pending A1c      Relevant Orders   Hemoglobin A1c       I have discontinued Vanessa Higgins's HYDROcodone-acetaminophen and amoxicillin-clavulanate. I am also having her maintain her Multiple Vitamins-Minerals (CENTRUM SILVER PO), aspirin, b complex vitamins, loratadine, metFORMIN, atorvastatin, meclizine, montelukast, liraglutide, Verapamil HCl CR, JARDIANCE, omeprazole, ibuprofen, phenazopyridine, Fluticasone-Salmeterol, losartan, albuterol, gabapentin, (Menthol, Topical Analgesic, (BIOFREEZE EX)), and venlafaxine XR.   No orders of the defined types were placed in this encounter.   Return precautions given.   Risks,  benefits, and alternatives of the medications and treatment plan prescribed today were discussed, and patient expressed understanding.   Education regarding symptom management and diagnosis given to patient on AVS.  Continue to follow with Burnard Hawthorne, FNP for routine health maintenance.   [name] and I agreed with plan.   Mable Paris, FNP

## 2018-03-13 NOTE — Assessment & Plan Note (Signed)
Well-controlled.  Continue current regimen. 

## 2018-03-19 ENCOUNTER — Telehealth: Payer: Self-pay | Admitting: Family

## 2018-03-19 NOTE — Telephone Encounter (Signed)
Patient requesting lab results from 03/13/18 thanks

## 2018-03-19 NOTE — Telephone Encounter (Signed)
Copied from [place]. Topic: Quick Communication - See Telephone Encounter >> Mar 19, 2018  3:46 PM Cleaster Corin, NT wrote: CRM for notification. See Telephone encounter for: 03/19/18.  Pt. Waiting on callback to receive lab results [phone]

## 2018-03-20 NOTE — Telephone Encounter (Signed)
They were released to mychart Please review with pt See message

## 2018-03-20 NOTE — Telephone Encounter (Signed)
Patient given lab results from 03/13/18

## 2018-03-26 ENCOUNTER — Other Ambulatory Visit: Payer: Self-pay | Admitting: Family

## 2018-03-26 DIAGNOSIS — Z1231 Encounter for screening mammogram for malignant neoplasm of breast: Secondary | ICD-10-CM

## 2018-04-13 DIAGNOSIS — G4733 Obstructive sleep apnea (adult) (pediatric): Secondary | ICD-10-CM | POA: Diagnosis not present

## 2018-04-15 ENCOUNTER — Ambulatory Visit: Payer: 59 | Admitting: Pharmacist

## 2018-05-01 ENCOUNTER — Ambulatory Visit
Admission: RE | Admit: 2018-05-01 | Discharge: 2018-05-01 | Disposition: A | Discharge status: Home / Self Care | Payer: 59 | Source: Ambulatory Visit | Attending: Family | Admitting: Family

## 2018-05-01 DIAGNOSIS — Z1231 Encounter for screening mammogram for malignant neoplasm of breast: Secondary | ICD-10-CM | POA: Diagnosis not present

## 2018-05-21 DIAGNOSIS — M1711 Unilateral primary osteoarthritis, right knee: Secondary | ICD-10-CM | POA: Diagnosis not present

## 2018-05-21 DIAGNOSIS — M25561 Pain in right knee: Secondary | ICD-10-CM | POA: Diagnosis not present

## 2018-06-12 ENCOUNTER — Other Ambulatory Visit: Payer: Self-pay | Admitting: Family

## 2018-06-17 ENCOUNTER — Encounter: Payer: Self-pay | Admitting: Family

## 2018-06-17 ENCOUNTER — Ambulatory Visit: Payer: 59 | Admitting: Family

## 2018-06-17 VITALS — BP 120/58 | HR 72 | Temp 98.0°F | Resp 16 | Wt 206.1 lb

## 2018-06-17 DIAGNOSIS — I10 Essential (primary) hypertension: Secondary | ICD-10-CM

## 2018-06-17 DIAGNOSIS — E119 Type 2 diabetes mellitus without complications: Secondary | ICD-10-CM

## 2018-06-17 DIAGNOSIS — Z0000 Encounter for general adult medical examination without abnormal findings: Secondary | ICD-10-CM | POA: Diagnosis not present

## 2018-06-17 DIAGNOSIS — E785 Hyperlipidemia, unspecified: Secondary | ICD-10-CM | POA: Diagnosis not present

## 2018-06-17 DIAGNOSIS — G4733 Obstructive sleep apnea (adult) (pediatric): Secondary | ICD-10-CM | POA: Diagnosis not present

## 2018-06-17 DIAGNOSIS — Z8639 Personal history of other endocrine, nutritional and metabolic disease: Secondary | ICD-10-CM

## 2018-06-17 LAB — COMPREHENSIVE METABOLIC PANEL
ALT: 23 U/L (ref 0–35)
AST: 24 U/L (ref 0–37)
Albumin: 4.1 g/dL (ref 3.5–5.2)
Alkaline Phosphatase: 47 U/L (ref 39–117)
BUN: 17 mg/dL (ref 6–23)
CO2: 27 mEq/L (ref 19–32)
Calcium: 9.4 mg/dL (ref 8.4–10.5)
Chloride: 103 mEq/L (ref 96–112)
Creatinine, Ser: 0.51 mg/dL (ref 0.40–1.20)
GFR: 127.13 mL/min (ref >60.00)
Glucose, Bld: 134 mg/dL — ABNORMAL HIGH (ref 70–99)
Potassium: 4.1 mEq/L (ref 3.5–5.1)
Sodium: 139 mEq/L (ref 135–145)
Total Bilirubin: 1 mg/dL (ref 0.2–1.2)
Total Protein: 7.4 g/dL (ref 6.0–8.3)

## 2018-06-17 LAB — LIPID PANEL
Cholesterol: 134 mg/dL (ref 0–200)
HDL: 48.9 mg/dL (ref >39.00)
LDL Cholesterol: 63 mg/dL (ref 0–99)
NonHDL: 84.91
Total CHOL/HDL Ratio: 3
Triglycerides: 112 mg/dL (ref 0.0–149.0)
VLDL: 22.4 mg/dL (ref 0.0–40.0)

## 2018-06-17 LAB — VITAMIN D 25 HYDROXY (VIT D DEFICIENCY, FRACTURES): VITD: 37.32 ng/mL (ref 30.00–100.00)

## 2018-06-17 LAB — HEMOGLOBIN A1C: Hgb A1c MFr Bld: 7.3 % — ABNORMAL HIGH (ref 4.6–6.5)

## 2018-06-17 MED ORDER — METFORMIN HCL 1000 MG PO TABS: 1000.0000 mg | ORAL_TABLET | Freq: Two times a day (BID) | ORAL | 3 refills | Status: DC

## 2018-06-17 MED ORDER — JARDIANCE 10 MG PO TABS: 10.0000 mg | ORAL_TABLET | Freq: Every day | ORAL | 3 refills | Status: DC

## 2018-06-17 MED ORDER — LIRAGLUTIDE 18 MG/3ML ~~LOC~~ SOPN
PEN_INJECTOR | SUBCUTANEOUS | 3 refills | Status: DC
Start: 2018-06-17 — End: 2018-11-23

## 2018-06-17 NOTE — Patient Instructions (Signed)
Labs  Health Maintenance, Female Adopting a healthy lifestyle and getting preventive care can go a long way to promote health and wellness. Talk with your health care provider about what schedule of regular examinations is right for you. This is a good chance for you to check in with your provider about disease prevention and staying healthy. In between checkups, there are plenty of things you can do on your own. Experts have done a lot of research about which lifestyle changes and preventive measures are most likely to keep you healthy. Ask your health care provider for more information. Weight and diet Eat a healthy diet  Be sure to include plenty of vegetables, fruits, low-fat dairy products, and lean protein.  Do not eat a lot of foods high in solid fats, added sugars, or salt.  Get regular exercise. This is one of the most important things you can do for your health. ? Most adults should exercise for at least 150 minutes each week. The exercise should increase your heart rate and make you sweat (moderate-intensity exercise). ? Most adults should also do strengthening exercises at least twice a week. This is in addition to the moderate-intensity exercise.  Maintain a healthy weight  Body mass index (BMI) is a measurement that can be used to identify possible weight problems. It estimates body fat based on height and weight. Your health care provider can help determine your BMI and help you achieve or maintain a healthy weight.  For females 29 years of age and older: ? A BMI below 18.5 is considered underweight. ? A BMI of 18.5 to 24.9 is normal. ? A BMI of 25 to 29.9 is considered overweight. ? A BMI of 30 and above is considered obese.  Watch levels of cholesterol and blood lipids  You should start having your blood tested for lipids and cholesterol at 69 years of age, then have this test every 5 years.  You may need to have your cholesterol levels checked more often if: ? Your  lipid or cholesterol levels are high. ? You are older than 69 years of age. ? You are at high risk for heart disease.  Cancer screening Lung Cancer  Lung cancer screening is recommended for adults 58-72 years old who are at high risk for lung cancer because of a history of smoking.  A yearly low-dose CT scan of the lungs is recommended for people who: ? Currently smoke. ? Have quit within the past 15 years. ? Have at least a 30-pack-year history of smoking. A pack year is smoking an average of one pack of cigarettes a day for 1 year.  Yearly screening should continue until it has been 15 years since you quit.  Yearly screening should stop if you develop a health problem that would prevent you from having lung cancer treatment.  Breast Cancer  Practice breast self-awareness. This means understanding how your breasts normally appear and feel.  It also means doing regular breast self-exams. Let your health care provider know about any changes, no matter how small.  If you are in your 20s or 30s, you should have a clinical breast exam (CBE) by a health care provider every 1-3 years as part of a regular health exam.  If you are 84 or older, have a CBE every year. Also consider having a breast X-ray (mammogram) every year.  If you have a family history of breast cancer, talk to your health care provider about genetic screening.  If you are at  for breast cancer, talk to your health care provider about having an MRI and a mammogram every year.  Breast cancer gene (BRCA) assessment is recommended for women who have family members with BRCA-related cancers. BRCA-related cancers include: ? Breast. ? Ovarian. ? Tubal. ? Peritoneal cancers.  Results of the assessment will determine the need for genetic counseling and BRCA1 and BRCA2 testing.  Cervical Cancer Your health care provider may recommend that you be screened regularly for cancer of the pelvic organs (ovaries, uterus, and  vagina). This screening involves a pelvic examination, including checking for microscopic changes to the surface of your cervix (Pap test). You may be encouraged to have this screening done every 3 years, beginning at age 21.  For women ages 30-65, health care providers may recommend pelvic exams and Pap testing every 3 years, or they may recommend the Pap and pelvic exam, combined with testing for human papilloma virus (HPV), every 5 years. Some types of HPV increase your risk of cervical cancer. Testing for HPV may also be done on women of any age with unclear Pap test results.  Other health care providers may not recommend any screening for nonpregnant women who are considered low risk for pelvic cancer and who do not have symptoms. Ask your health care provider if a screening pelvic exam is right for you.  If you have had past treatment for cervical cancer or a condition that could lead to cancer, you need Pap tests and screening for cancer for at least 20 years after your treatment. If Pap tests have been discontinued, your risk factors (such as having a new sexual partner) need to be reassessed to determine if screening should resume. Some women have medical problems that increase the chance of getting cervical cancer. In these cases, your health care provider may recommend more frequent screening and Pap tests.  Colorectal Cancer  This type of cancer can be detected and often prevented.  Routine colorectal cancer screening usually begins at 69 years of age and continues through 69 years of age.  Your health care provider may recommend screening at an earlier age if you have risk factors for colon cancer.  Your health care provider may also recommend using home test kits to check for hidden blood in the stool.  A small camera at the end of a tube can be used to examine your colon directly (sigmoidoscopy or colonoscopy). This is done to check for the earliest forms of colorectal  cancer.  Routine screening usually begins at age 50.  Direct examination of the colon should be repeated every 5-10 years through 69 years of age. However, you may need to be screened more often if early forms of precancerous polyps or small growths are found.  Skin Cancer  Check your skin from head to toe regularly.  Tell your health care provider about any new moles or changes in moles, especially if there is a change in a mole's shape or color.  Also tell your health care provider if you have a mole that is larger than the size of a pencil eraser.  Always use sunscreen. Apply sunscreen liberally and repeatedly throughout the day.  Protect yourself by wearing long sleeves, pants, a wide-brimmed hat, and sunglasses whenever you are outside.  Heart disease, diabetes, and high blood pressure  High blood pressure causes heart disease and increases the risk of stroke. High blood pressure is more likely to develop in: ? People who have blood pressure in the high end of   the normal range (130-139/85-89 mm Hg). ? People who are overweight or obese. ? People who are African American.  If you are 18-39 years of age, have your blood pressure checked every 3-5 years. If you are 40 years of age or older, have your blood pressure checked every year. You should have your blood pressure measured twice-once when you are at a hospital or clinic, and once when you are not at a hospital or clinic. Record the average of the two measurements. To check your blood pressure when you are not at a hospital or clinic, you can use: ? An automated blood pressure machine at a pharmacy. ? A home blood pressure monitor.  If you are between 55 years and 79 years old, ask your health care provider if you should take aspirin to prevent strokes.  Have regular diabetes screenings. This involves taking a blood sample to check your fasting blood sugar level. ? If you are at a normal weight and have a low risk for diabetes,  have this test once every three years after 69 years of age. ? If you are overweight and have a high risk for diabetes, consider being tested at a younger age or more often. Preventing infection Hepatitis B  If you have a higher risk for hepatitis B, you should be screened for this virus. You are considered at high risk for hepatitis B if: ? You were born in a country where hepatitis B is common. Ask your health care provider which countries are considered high risk. ? Your parents were born in a high-risk country, and you have not been immunized against hepatitis B (hepatitis B vaccine). ? You have HIV or AIDS. ? You use needles to inject street drugs. ? You live with someone who has hepatitis B. ? You have had sex with someone who has hepatitis B. ? You get hemodialysis treatment. ? You take certain medicines for conditions, including cancer, organ transplantation, and autoimmune conditions.  Hepatitis C  Blood testing is recommended for: ? Everyone born from 1945 through 1965. ? Anyone with known risk factors for hepatitis C.  Sexually transmitted infections (STIs)  You should be screened for sexually transmitted infections (STIs) including gonorrhea and chlamydia if: ? You are sexually active and are younger than 69 years of age. ? You are older than 69 years of age and your health care provider tells you that you are at risk for this type of infection. ? Your sexual activity has changed since you were last screened and you are at an increased risk for chlamydia or gonorrhea. Ask your health care provider if you are at risk.  If you do not have HIV, but are at risk, it may be recommended that you take a prescription medicine daily to prevent HIV infection. This is called pre-exposure prophylaxis (PrEP). You are considered at risk if: ? You are sexually active and do not regularly use condoms or know the HIV status of your partner(s). ? You take drugs by injection. ? You are  sexually active with a partner who has HIV.  Talk with your health care provider about whether you are at high risk of being infected with HIV. If you choose to begin PrEP, you should first be tested for HIV. You should then be tested every 3 months for as long as you are taking PrEP. Pregnancy  If you are premenopausal and you may become pregnant, ask your health care provider about preconception counseling.  If you may become   may become pregnant, take 400 to 800 micrograms (mcg) of folic acid every day.  If you want to prevent pregnancy, talk to your health care provider about birth control (contraception). Osteoporosis and menopause  Osteoporosis is a disease in which the bones lose minerals and strength with aging. This can result in serious bone fractures. Your risk for osteoporosis can be identified using a bone density scan.  If you are 45 years of age or older, or if you are at risk for osteoporosis and fractures, ask your health care provider if you should be screened.  Ask your health care provider whether you should take a calcium or vitamin D supplement to lower your risk for osteoporosis.  Menopause may have certain physical symptoms and risks.  Hormone replacement therapy may reduce some of these symptoms and risks. Talk to your health care provider about whether hormone replacement therapy is right for you. Follow these instructions at home:  Schedule regular health, dental, and eye exams.  Stay current with your immunizations.  Do not use any tobacco products including cigarettes, chewing tobacco, or electronic cigarettes.  If you are pregnant, do not drink alcohol.  If you are breastfeeding, limit how much and how often you drink alcohol.  Limit alcohol intake to no more than 1 drink per day for nonpregnant women. One drink equals 12 ounces of beer, 5 ounces of wine, or 1 ounces of hard liquor.  Do not use street drugs.  Do not share needles.  Ask your health care  provider for help if you need support or information about quitting drugs.  Tell your health care provider if you often feel depressed.  Tell your health care provider if you have ever been abused or do not feel safe at home. This information is not intended to replace advice given to you by your health care provider. Make sure you discuss any questions you have with your health care provider. Document Released: 07/01/2011 Document Revised: 05/23/2016 Document Reviewed: 09/19/2015 Elsevier Interactive Patient Education  [employer].

## 2018-06-17 NOTE — Assessment & Plan Note (Signed)
Well-controlled.  Continue current regimen. 

## 2018-06-17 NOTE — Assessment & Plan Note (Signed)
Compliant 

## 2018-06-17 NOTE — Assessment & Plan Note (Addendum)
Clinical breast exam performed. Pending dexa.  Patient declines further Pap smears as she had normal Pap smears up to the age of 29.  No pelvic concerns today, therefore pelvic exam was deferred.  Patient has what appears to be seborrheic dermatitis, pending evaluation with dermatology for annual skin check as well as evaluation of this lesion.  Discussed appropriate screening labs with patient, ordered labs based on history, symptoms.

## 2018-06-17 NOTE — Progress Notes (Signed)
Subjective:    Patient ID: Vanessa Higgins, female    DOB: 27-Jan-1951, 67 y.o.   MRN: [id number]  CC: Vanessa Higgins is a 67 y.o. female who presents today for physical exam.    HPI: Doing well.  No complaints today.  Dm- compliant with medication. FBG 120-140. No LE numbness.  Eye exam 06/2018  OSA- compliant with cipap  History of vitamin D deficiency.  No fatigue.  Has been taking vitamin D supplement.    Colorectal Cancer Screening: UTD , due next year.  Breast Cancer Screening: Mammogram UTD Cervical Cancer Screening: normal at 67 years old; done OB. No abnormal pap in last 10 years.  No pelvic pain, vaginal bleeding. Bone Health screening/DEXA for 65+: osteopenia 2017  Lung Cancer Screening: Doesn't have 30 year pack year history and age > 9 years. Immunizations       Tetanus - utd        Pneumococcal - Candidate for, complete.   Labs: Screening labs today. Exercise: Gets regular exercise.  Alcohol use: none Smoking/tobacco use: Nonsmoker.  Regular dental exams: UTD Wears seat belt: Yes. Skin : new mole on abdomen; no h/o skin cancer  HISTORY:  Past Medical History:  Diagnosis Date  . Allergy    hay fever  . Ankle fracture, left 04/08/2014  . Arthritis   . Asthma   . Blood transfusion abn reaction or complication, no procedure mishap   . Depression   . Diabetes mellitus   . Disc displacement, lumbar   . Fatty liver   . GERD (gastroesophageal reflux disease)   . Headache(784.0)   . Hyperlipidemia   . Hypertension   . Migraine   . Obstructive sleep apnea    CPAP  . PONV (postoperative nausea and vomiting)   . Sleep apnea 04/06/2017  . UTI (lower urinary tract infection)   . Wears glasses     Past Surgical History:  Procedure Laterality Date  . APPENDECTOMY  1977  . COLONOSCOPY    . LUMBAR LAMINECTOMY/DECOMPRESSION MICRODISCECTOMY Right 02/06/2018   Procedure: Lumbar two Hemilaminectomy for Discectomy;  Surgeon: Ashok Pall, MD;  Location: [facility];   Service: Neurosurgery;  Laterality: Right;  . ORIF ANKLE FRACTURE Left 04/08/2014   Procedure: OPEN REDUCTION INTERNAL FIXATION (ORIF) ANKLE FRACTURE LEFT ANKLE FRACTURE OPEN TREATMENT BIMALLEOLAR ANKLE INCLUDES INTERNAL FIXATION;  Surgeon: Johnny Bridge, MD;  Location: [facility];  Service: Orthopedics;  Laterality: Left;  . PAROTIDECTOMY  1980   left  . TONSILLECTOMY AND ADENOIDECTOMY     as a child  . 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 TUBAL LIGATION  05/1976   Family History  Problem Relation Age of Onset  . Sudden death Mother   . Cancer Mother 22       pancreatic  . Heart disease Father 83  . Sudden death Father   . Cancer Brother        colon  . Stroke Maternal Grandmother   . Heart disease Maternal Grandfather   . Cancer Sister        brain  . Breast cancer Neg Hx       ALLERGIES: Sulfa antibiotics and Ramipril  Current Outpatient Medications on File Prior to Visit  Medication Sig Dispense Refill  . albuterol (PROVENTIL HFA;VENTOLIN HFA) 108 (90 Base) MCG/ACT inhaler Inhale 2 puffs into the lungs every 6 (six) hours as needed for wheezing or shortness of breath.    Marland Kitchen aspirin 81  MG tablet Take 81 mg by mouth daily.    Marland Kitchen atorvastatin (LIPITOR) 10 MG tablet Take 10 mg by mouth at bedtime.     Marland Kitchen b complex vitamins tablet Take 1 tablet by mouth daily.    . Fluticasone-Salmeterol (ADVAIR DISKUS) 250-50 MCG/DOSE AEPB INHALE 1 PUFF INTO THE LUNGS 2 TIMES DAILY. 60 each 6  . gabapentin (NEURONTIN) 300 MG capsule Take 300 mg by mouth 3 (three) times daily.  1  . loratadine (CLARITIN) 10 MG tablet Take 10 mg by mouth daily.    Marland Kitchen losartan (COZAAR) 25 MG tablet TAKE 1 TABLET BY MOUTH DAILY 90 tablet 0  . meclizine (ANTIVERT) 25 MG tablet Take 1 tablet (25 mg total) by mouth 3 (three) times daily as needed for dizziness. 30 tablet 0  . Menthol, Topical Analgesic, (BIOFREEZE EX) Apply 1 application topically daily.    . montelukast (SINGULAIR) 10 MG tablet  Take 1 tablet (10 mg total) by mouth at bedtime. 90 tablet 3  . Multiple Vitamins-Minerals (CENTRUM SILVER PO) Take 1 tablet by mouth daily.     Marland Kitchen omeprazole (PRILOSEC) 20 MG capsule TAKE 1 CAPSULE (20 MG TOTAL) BY MOUTH DAILY. 90 capsule 3  . phenazopyridine (PYRIDIUM) 200 MG tablet Take 1 tablet (200 mg total) by mouth 3 (three) times daily as needed for pain. 6 tablet 0  . venlafaxine XR (EFFEXOR-XR) 150 MG 24 hr capsule TAKE 1 CAPSULE BY MOUTH DAILY WITH BREAKFAST. 90 capsule 1  . Verapamil HCl CR 300 MG CP24 Take 1 capsule (300 mg total) by mouth daily. (Patient taking differently: Take 1 capsule by mouth at bedtime. ) 90 capsule 3   No current facility-administered medications on file prior to visit.     Social History   Tobacco Use  . Smoking status: Never Smoker  . Smokeless tobacco: Never Used  Substance Use Topics  . Alcohol use: No  . Drug use: No    Review of Systems  Constitutional: Negative for chills, fatigue, fever and unexpected weight change.  HENT: Negative for congestion.   Respiratory: Negative for cough.   Cardiovascular: Negative for chest pain, palpitations and leg swelling.  Gastrointestinal: Negative for nausea and vomiting.  Endocrine: Negative for cold intolerance and heat intolerance.  Genitourinary: Negative for dysuria, pelvic pain and vaginal discharge.  Musculoskeletal: Negative for arthralgias and myalgias.  Skin: Negative for rash.  Neurological: Negative for headaches.  Hematological: Negative for adenopathy.  Psychiatric/Behavioral: Negative for confusion.      Objective:    BP (!) 120/58 (BP Location: Left Arm, Patient Position: Sitting, Cuff Size: Large)   Pulse 72   Temp 98 F (36.7 C) (Oral)   Resp 16   Wt 206 lb 2 oz (93.5 kg)   SpO2 95%   BMI 35.38 kg/m   BP Readings from Last 3 Encounters:  06/17/18 (!) 120/58  03/13/18 114/72  02/19/18 110/84   Wt Readings from Last 3 Encounters:  06/17/18 206 lb 2 oz (93.5 kg)    03/13/18 197 lb 12.8 oz (89.7 kg)  02/19/18 199 lb 2 oz (90.3 kg)    Physical Exam  Constitutional: She appears well-developed and well-nourished.  Eyes: Conjunctivae are normal.  Neck: No thyroid mass and no thyromegaly present.  Cardiovascular: Normal rate, regular rhythm, normal heart sounds and normal pulses.  Pulmonary/Chest: Effort normal and breath sounds normal. She has no wheezes. She has no rhonchi. She has no rales. Right breast exhibits no inverted nipple, no mass, no nipple discharge,  no skin change and no tenderness. Left breast exhibits no inverted nipple, no mass, no nipple discharge, no skin change and no tenderness. Breasts are symmetrical.  CBE performed.   Abdominal: Soft. Normal appearance and bowel sounds are normal. She exhibits no distension, no fluid wave, no ascites and no mass. There is no tenderness. There is no rigidity, no rebound, no guarding and no CVA tenderness.  Lymphadenopathy:       Head (right side): No submental, no submandibular, no tonsillar, no preauricular, no posterior auricular and no occipital adenopathy present.       Head (left side): No submental, no submandibular, no tonsillar, no preauricular, no posterior auricular and no occipital adenopathy present.    She has no cervical adenopathy.       Right cervical: No superficial cervical, no deep cervical and no posterior cervical adenopathy present.      Left cervical: No superficial cervical, no deep cervical and no posterior cervical adenopathy present.    She has no axillary adenopathy.  Neurological: She is alert.  Skin: Skin is warm and dry.     Stuck on lesion, brown in color noted as marked on diagram.  No purulent discharge, bleeding  Psychiatric: She has a normal mood and affect. Her speech is normal and behavior is normal. Thought content normal.  Vitals reviewed.      Assessment & Plan:   Problem List Items Addressed This Visit      Cardiovascular and Mediastinum    Hypertension    Well-controlled.  Continue current regimen        Respiratory   OSA (obstructive sleep apnea)    Compliant         Endocrine   Diabetes mellitus type 2, controlled (HCC)   Relevant Medications   metFORMIN (GLUCOPHAGE) 1000 MG tablet   JARDIANCE 10 MG TABS tablet   liraglutide (VICTOZA) 18 MG/3ML SOPN   Other Relevant Orders   Hemoglobin A1c     Other   Hyperlipidemia   Relevant Orders   Lipid panel   Routine general medical examination at a health care facility - Primary    Clinical breast exam performed. Pending dexa.  Patient declines further Pap smears as she had normal Pap smears up to the age of 47.  No pelvic concerns today, therefore pelvic exam was deferred.  Patient has what appears to be seborrheic dermatitis, pending evaluation with dermatology for annual skin check as well as evaluation of this lesion.  Discussed appropriate screening labs with patient, ordered labs based on history, symptoms.      Relevant Orders   Comprehensive metabolic panel   DG Bone Density   Ambulatory referral to Dermatology    Other Visit Diagnoses    History of vitamin D deficiency       Relevant Orders   VITAMIN D 25 Hydroxy (Vit-D Deficiency, Fractures)       I have discontinued Adna S. Michalski's ibuprofen. I am also having her maintain her Multiple Vitamins-Minerals (CENTRUM SILVER PO), aspirin, b complex vitamins, loratadine, atorvastatin, meclizine, montelukast, Verapamil HCl CR, omeprazole, phenazopyridine, Fluticasone-Salmeterol, albuterol, gabapentin, (Menthol, Topical Analgesic, (BIOFREEZE EX)), venlafaxine XR, losartan, metFORMIN, JARDIANCE, and liraglutide.   Meds ordered this encounter  Medications  . metFORMIN (GLUCOPHAGE) 1000 MG tablet    Sig: Take 1 tablet (1,000 mg total) by mouth 2 (two) times daily with a meal.    Dispense:  180 tablet    Refill:  3  . JARDIANCE 10 MG TABS tablet  Sig: Take 10 mg by mouth daily.    Dispense:  90 tablet     Refill:  3  . liraglutide (VICTOZA) 18 MG/3ML SOPN    Sig: Inject 1.8 mg by mouth at bedtime    Dispense:  9 pen    Refill:  3    Order Specific Question:   Supervising Provider    Answer:   Crecencio Mc [[id number]]    Return precautions given.   Risks, benefits, and alternatives of the medications and treatment plan prescribed today were discussed, and patient expressed understanding.   Education regarding symptom management and diagnosis given to patient on AVS.   Continue to follow with Burnard Hawthorne, FNP for routine health maintenance.   [name] and I agreed with plan.   Mable Paris, FNP

## 2018-06-22 ENCOUNTER — Other Ambulatory Visit: Payer: Self-pay | Admitting: Family

## 2018-06-22 ENCOUNTER — Encounter: Payer: Self-pay | Admitting: Family

## 2018-06-22 DIAGNOSIS — E119 Type 2 diabetes mellitus without complications: Secondary | ICD-10-CM

## 2018-06-22 MED ORDER — EMPAGLIFLOZIN 25 MG PO TABS: 25.0000 mg | ORAL_TABLET | Freq: Every day | ORAL | 1 refills | Status: DC

## 2018-06-22 NOTE — Progress Notes (Signed)
close

## 2018-07-01 DIAGNOSIS — H524 Presbyopia: Secondary | ICD-10-CM | POA: Diagnosis not present

## 2018-07-01 LAB — HM DIABETES EYE EXAM

## 2018-07-06 ENCOUNTER — Telehealth: Payer: Self-pay | Admitting: Family

## 2018-07-06 NOTE — Telephone Encounter (Signed)
Spoke with patient and she has started Vanessa Higgins and states her readings are down and she will call back to schedule office visit.

## 2018-07-06 NOTE — Telephone Encounter (Signed)
Call pt  Unread mychart   Please ensure she has started 25mg  jardiance  Also ensure she has follow up scheduled mid 08/2018 for DM f/u and labs

## 2018-08-12 DIAGNOSIS — L821 Other seborrheic keratosis: Secondary | ICD-10-CM | POA: Diagnosis not present

## 2018-08-12 DIAGNOSIS — Z1283 Encounter for screening for malignant neoplasm of skin: Secondary | ICD-10-CM | POA: Diagnosis not present

## 2018-08-12 DIAGNOSIS — L304 Erythema intertrigo: Secondary | ICD-10-CM | POA: Diagnosis not present

## 2018-08-12 DIAGNOSIS — D2270 Melanocytic nevi of unspecified lower limb, including hip: Secondary | ICD-10-CM | POA: Diagnosis not present

## 2018-08-12 DIAGNOSIS — L812 Freckles: Secondary | ICD-10-CM | POA: Diagnosis not present

## 2018-08-12 DIAGNOSIS — D225 Melanocytic nevi of trunk: Secondary | ICD-10-CM | POA: Diagnosis not present

## 2018-08-24 ENCOUNTER — Other Ambulatory Visit: Payer: Self-pay | Admitting: Family

## 2018-09-14 ENCOUNTER — Other Ambulatory Visit: Payer: Self-pay | Admitting: Family

## 2018-09-23 ENCOUNTER — Ambulatory Visit: Payer: 59 | Admitting: Family

## 2018-09-24 ENCOUNTER — Other Ambulatory Visit: Payer: Self-pay | Admitting: Family

## 2018-09-24 MED ORDER — LOSARTAN POTASSIUM 25 MG PO TABS: 25.0000 mg | ORAL_TABLET | Freq: Every day | ORAL | 0 refills | Status: DC

## 2018-10-20 ENCOUNTER — Other Ambulatory Visit: Payer: Self-pay | Admitting: Family

## 2018-10-22 ENCOUNTER — Other Ambulatory Visit: Payer: Self-pay

## 2018-10-22 NOTE — Telephone Encounter (Signed)
Historically med  Previously prescribed by Dr Eddie Dibbles

## 2018-10-23 MED ORDER — ATORVASTATIN CALCIUM 10 MG PO TABS: 10.0000 mg | ORAL_TABLET | Freq: Every day | ORAL | 1 refills | Status: DC

## 2018-11-18 ENCOUNTER — Ambulatory Visit: Payer: 59 | Admitting: Family

## 2018-11-18 ENCOUNTER — Encounter: Payer: Self-pay | Admitting: Family

## 2018-11-18 VITALS — BP 118/66 | HR 76 | Temp 98.0°F | Resp 15 | Ht 64.0 in | Wt 202.4 lb

## 2018-11-18 DIAGNOSIS — K219 Gastro-esophageal reflux disease without esophagitis: Secondary | ICD-10-CM

## 2018-11-18 DIAGNOSIS — K760 Fatty (change of) liver, not elsewhere classified: Secondary | ICD-10-CM | POA: Diagnosis not present

## 2018-11-18 DIAGNOSIS — I10 Essential (primary) hypertension: Secondary | ICD-10-CM | POA: Diagnosis not present

## 2018-11-18 DIAGNOSIS — F325 Major depressive disorder, single episode, in full remission: Secondary | ICD-10-CM | POA: Insufficient documentation

## 2018-11-18 DIAGNOSIS — J45909 Unspecified asthma, uncomplicated: Secondary | ICD-10-CM | POA: Diagnosis not present

## 2018-11-18 DIAGNOSIS — E119 Type 2 diabetes mellitus without complications: Secondary | ICD-10-CM | POA: Diagnosis not present

## 2018-11-18 LAB — COMPREHENSIVE METABOLIC PANEL
ALT: 25 U/L (ref 0–35)
AST: 25 U/L (ref 0–37)
Albumin: 4.3 g/dL (ref 3.5–5.2)
Alkaline Phosphatase: 45 U/L (ref 39–117)
BUN: 17 mg/dL (ref 6–23)
CO2: 28 mEq/L (ref 19–32)
Calcium: 9.4 mg/dL (ref 8.4–10.5)
Chloride: 102 mEq/L (ref 96–112)
Creatinine, Ser: 0.55 mg/dL (ref 0.40–1.20)
GFR: 116.38 mL/min (ref >60.00)
Glucose, Bld: 122 mg/dL — ABNORMAL HIGH (ref 70–99)
Potassium: 4.1 mEq/L (ref 3.5–5.1)
Sodium: 139 mEq/L (ref 135–145)
Total Bilirubin: 1 mg/dL (ref 0.2–1.2)
Total Protein: 7.5 g/dL (ref 6.0–8.3)

## 2018-11-18 LAB — HEMOGLOBIN A1C: Hgb A1c MFr Bld: 7.2 % — ABNORMAL HIGH (ref 4.6–6.5)

## 2018-11-18 MED ORDER — METFORMIN HCL 1000 MG PO TABS: 1000.0000 mg | ORAL_TABLET | Freq: Two times a day (BID) | ORAL | 3 refills | Status: DC

## 2018-11-18 MED ORDER — VENLAFAXINE HCL ER 150 MG PO CP24: ORAL_CAPSULE | ORAL | 1 refills | Status: DC

## 2018-11-18 MED ORDER — LOSARTAN POTASSIUM 25 MG PO TABS: 25.0000 mg | ORAL_TABLET | Freq: Every day | ORAL | 0 refills | Status: DC

## 2018-11-18 MED ORDER — EMPAGLIFLOZIN 25 MG PO TABS: 25.0000 mg | ORAL_TABLET | Freq: Every day | ORAL | 1 refills | Status: DC

## 2018-11-18 MED ORDER — MONTELUKAST SODIUM 10 MG PO TABS: 10.0000 mg | ORAL_TABLET | Freq: Every day | ORAL | 1 refills | Status: DC

## 2018-11-18 NOTE — Progress Notes (Signed)
Subjective:    Patient ID: Vanessa Higgins, female    DOB: [birth year], 67 y.o.   MRN: [id number]  CC: Vanessa Higgins is a 67 y.o. female who presents today for follow up.   HPI: Feels well. No complaints today  HTN- compliant with medication. Denies exertional chest pain or pressure, numbness or tingling radiating to left arm or jaw, palpitations, dizziness, frequent headaches, changes in vision, or shortness of breath.   Depression- on effexor. Doing very well. No si/hi.   DM- compliant with medications. FBG 133, 120.   Asthma- would like refill of singuliar which keeps triggers under control. No wheezing, sob.   HLD/NAFLD- compliant with lipitor. Works in endoscopy.   GERD- on prilosec while taking NSAIDs more often due to knee pain ( following with [facility]).      HISTORY:  Past Medical History:  Diagnosis Date  . Allergy    hay fever  . Ankle fracture, left 04/08/2014  . Arthritis   . Asthma   . Blood transfusion abn reaction or complication, no procedure mishap   . Depression   . Diabetes mellitus   . Disc displacement, lumbar   . Fatty liver   . GERD (gastroesophageal reflux disease)   . Headache(784.0)   . Hyperlipidemia   . Hypertension   . Migraine   . Obstructive sleep apnea    CPAP  . PONV (postoperative nausea and vomiting)   . Sleep apnea 04/06/2017  . UTI (lower urinary tract infection)   . Wears glasses    Past Surgical History:  Procedure Laterality Date  . APPENDECTOMY  1977  . COLONOSCOPY    . LUMBAR LAMINECTOMY/DECOMPRESSION MICRODISCECTOMY Right 02/06/2018   Procedure: Lumbar two Hemilaminectomy for Discectomy;  Surgeon: Ashok Pall, MD;  Location: [facility];  Service: Neurosurgery;  Laterality: Right;  . ORIF ANKLE FRACTURE Left 04/08/2014   Procedure: OPEN REDUCTION INTERNAL FIXATION (ORIF) ANKLE FRACTURE LEFT ANKLE FRACTURE OPEN TREATMENT BIMALLEOLAR ANKLE INCLUDES INTERNAL FIXATION;  Surgeon: Johnny Bridge, MD;  Location: [facility];  Service: Orthopedics;  Laterality: Left;  . PAROTIDECTOMY  1980   left  . TONSILLECTOMY AND ADENOIDECTOMY     as a child  . 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 TUBAL LIGATION  05/1976   Family History  Problem Relation Age of Onset  . Sudden death Mother   . Cancer Mother 106       pancreatic  . Heart disease Father 36  . Sudden death Father   . Cancer Brother        colon  . Stroke Maternal Grandmother   . Heart disease Maternal Grandfather   . Cancer Sister        brain  . Breast cancer Neg Hx     Allergies: Sulfa antibiotics and Ramipril Current Outpatient Medications on File Prior to Visit  Medication Sig Dispense Refill  . albuterol (PROVENTIL HFA;VENTOLIN HFA) 108 (90 Base) MCG/ACT inhaler Inhale 2 puffs into the lungs every 6 (six) hours as needed for wheezing or shortness of breath.    Marland Kitchen aspirin 81 MG tablet Take 81 mg by mouth daily.    Marland Kitchen atorvastatin (LIPITOR) 10 MG tablet Take 1 tablet (10 mg total) by mouth at bedtime. 90 tablet 1  . b complex vitamins tablet Take 1 tablet by mouth daily.    . Fluticasone-Salmeterol (ADVAIR DISKUS) 250-50 MCG/DOSE AEPB INHALE 1 PUFF INTO THE LUNGS 2 TIMES DAILY. 60 each 6  .  liraglutide (VICTOZA) 18 MG/3ML SOPN Inject 1.8 mg by mouth at bedtime 9 pen 3  . loratadine (CLARITIN) 10 MG tablet Take 10 mg by mouth daily.    . meclizine (ANTIVERT) 25 MG tablet Take 1 tablet (25 mg total) by mouth 3 (three) times daily as needed for dizziness. 30 tablet 0  . Multiple Vitamins-Minerals (CENTRUM SILVER PO) Take 1 tablet by mouth daily.     Marland Kitchen omeprazole (PRILOSEC) 20 MG capsule TAKE 1 CAPSULE (20 MG TOTAL) BY MOUTH DAILY. 90 capsule 0  . Verapamil HCl CR 300 MG CP24 TAKE 1 CAPSULE BY MOUTH DAILY 90 capsule 1   No current facility-administered medications on file prior to visit.     Social History   Tobacco Use  . Smoking status: Never Smoker  . Smokeless tobacco: Never Used  Substance Use Topics  . Alcohol use: No  .  Drug use: No    Review of Systems  Constitutional: Negative for chills and fever.  Respiratory: Negative for cough.   Cardiovascular: Negative for chest pain and palpitations.  Gastrointestinal: Negative for nausea and vomiting.  Psychiatric/Behavioral: Negative for sleep disturbance and suicidal ideas.      Objective:    BP 118/66 (BP Location: Left Arm, Patient Position: Sitting, Cuff Size: Large)   Pulse 76   Temp 98 F (36.7 C) (Oral)   Resp 15   Ht 5\' 4"  (1.626 m)   Wt 202 lb 6 oz (91.8 kg)   SpO2 99%   BMI 34.74 kg/m  BP Readings from Last 3 Encounters:  11/18/18 118/66  06/17/18 (!) 120/58  03/13/18 114/72   Wt Readings from Last 3 Encounters:  11/18/18 202 lb 6 oz (91.8 kg)  06/17/18 206 lb 2 oz (93.5 kg)  03/13/18 197 lb 12.8 oz (89.7 kg)    Physical Exam  Constitutional: She appears well-developed and well-nourished.  Eyes: Conjunctivae are normal.  Cardiovascular: Normal rate, regular rhythm, normal heart sounds and normal pulses.  Pulmonary/Chest: Effort normal and breath sounds normal. She has no wheezes. She has no rhonchi. She has no rales.  Neurological: She is alert.  Skin: Skin is warm and dry.  Psychiatric: She has a normal mood and affect. Her speech is normal and behavior is normal. Thought content normal.  Vitals reviewed.      Assessment & Plan:   Problem List Items Addressed This Visit      Cardiovascular and Mediastinum   Hypertension - Primary    At goal. Continue regimen      Relevant Medications   losartan (COZAAR) 25 MG tablet   Other Relevant Orders   Comprehensive metabolic panel     Respiratory   Asthma    Symptoms controlled. Refilled singular.       Relevant Medications   montelukast (SINGULAIR) 10 MG tablet     Digestive   Fatty liver disease, nonalcoholic    Discussed patient is doing well controlling comorbities; advised that she does speak with Dr Vira Agar regarding progression and surveillance for cirrhosis,  HCC. Patient verbalized understanding.      GERD (gastroesophageal reflux disease)    Short term use PPI; advised to use prn after knee surgery which she understood        Endocrine   Diabetes mellitus type 2, controlled ([place])    Suspect improved. Pending a1c      Relevant Medications   metFORMIN (GLUCOPHAGE) 1000 MG tablet   empagliflozin (JARDIANCE) 25 MG TABS tablet   losartan (COZAAR) 25  MG tablet   Other Relevant Orders   Hemoglobin A1c     Other   Depression, major, single episode, complete remission (HCC)    Stable. Continue regimen      Relevant Medications   venlafaxine XR (EFFEXOR-XR) 150 MG 24 hr capsule       I have discontinued Sheliah S. Weisner's phenazopyridine, gabapentin, and (Menthol, Topical Analgesic, (BIOFREEZE EX)). I have also changed her montelukast. Additionally, I am having her maintain her Multiple Vitamins-Minerals (CENTRUM SILVER PO), aspirin, b complex vitamins, loratadine, meclizine, albuterol, liraglutide, Verapamil HCl CR, Fluticasone-Salmeterol, omeprazole, atorvastatin, metFORMIN, empagliflozin, losartan, and venlafaxine XR.   Meds ordered this encounter  Medications  . metFORMIN (GLUCOPHAGE) 1000 MG tablet    Sig: Take 1 tablet (1,000 mg total) by mouth 2 (two) times daily with a meal.    Dispense:  180 tablet    Refill:  3  . empagliflozin (JARDIANCE) 25 MG TABS tablet    Sig: Take 25 mg by mouth daily.    Dispense:  90 tablet    Refill:  1  . losartan (COZAAR) 25 MG tablet    Sig: Take 1 tablet (25 mg total) by mouth daily.    Dispense:  90 tablet    Refill:  0  . montelukast (SINGULAIR) 10 MG tablet    Sig: Take 1 tablet (10 mg total) by mouth at bedtime.    Dispense:  90 tablet    Refill:  1  . venlafaxine XR (EFFEXOR-XR) 150 MG 24 hr capsule    Sig: TAKE 1 CAPSULE BY MOUTH DAILY WITH BREAKFAST.    Dispense:  90 capsule    Refill:  1    Return precautions given.   Risks, benefits, and alternatives of the medications and  treatment plan prescribed today were discussed, and patient expressed understanding.   Education regarding symptom management and diagnosis given to patient on AVS.  Continue to follow with Burnard Hawthorne, FNP for routine health maintenance.   [name] and I agreed with plan.   Mable Paris, FNP

## 2018-11-18 NOTE — Assessment & Plan Note (Signed)
Discussed patient is doing well controlling comorbities; advised that she does speak with Dr Vira Agar regarding progression and surveillance for cirrhosis, [place]. Patient verbalized understanding.

## 2018-11-18 NOTE — Assessment & Plan Note (Signed)
Suspect improved. Pending a1c

## 2018-11-18 NOTE — Assessment & Plan Note (Signed)
Symptoms controlled. Refilled singular.

## 2018-11-18 NOTE — Patient Instructions (Signed)
Pleasure seeing you   

## 2018-11-18 NOTE — Assessment & Plan Note (Signed)
Short term use PPI; advised to use prn after knee surgery which she understood

## 2018-11-18 NOTE — Assessment & Plan Note (Signed)
Stable. Continue regimen. 

## 2018-11-18 NOTE — Assessment & Plan Note (Signed)
At goal.  Continue regimen. 

## 2018-11-23 ENCOUNTER — Telehealth: Payer: Self-pay

## 2018-11-23 ENCOUNTER — Other Ambulatory Visit: Payer: Self-pay | Admitting: Family

## 2018-11-23 DIAGNOSIS — E119 Type 2 diabetes mellitus without complications: Secondary | ICD-10-CM

## 2018-11-23 NOTE — Telephone Encounter (Signed)
Copied from [place] [id number]. Topic: Quick Communication - Lab Results (Clinic Use ONLY) >> Nov 23, 2018  9:36 AM Vanessa Higgins wrote: Pt would like a call back about labs done last week Please call work number and ask for her

## 2018-11-23 NOTE — Telephone Encounter (Signed)
Patient is calling to get the results of her labs   Please advise

## 2018-11-24 ENCOUNTER — Other Ambulatory Visit: Payer: Self-pay

## 2018-11-24 MED ORDER — INSULIN PEN NEEDLE 32G X 4 MM MISC: 5 refills | Status: DC

## 2018-11-25 NOTE — Telephone Encounter (Signed)
released

## 2018-11-30 ENCOUNTER — Telehealth: Payer: Self-pay | Admitting: Family

## 2018-11-30 NOTE — Telephone Encounter (Signed)
Copied from [place] [id number]. Topic: Quick Communication - Lab Results (Clinic Use ONLY) >> Nov 25, 2018  2:50 PM Bare, Patriciaann Clan, CMA wrote: Called patient to inform them of  lab results. When patient returns call, triage nurse may disclose results. >> Nov 30, 2018 10:53 AM Burchel, Abbi R wrote: Pt would like someone to call her to discuss these results.

## 2018-11-30 NOTE — Telephone Encounter (Signed)
Left message on voicemail for pt to return call to office for results.    

## 2018-12-03 NOTE — Telephone Encounter (Signed)
Charted in result notes. 

## 2018-12-03 NOTE — Telephone Encounter (Signed)
Patient is returning phone call to get lab results.   [phone] until 2pm  [phone] after 3:30pm.

## 2018-12-09 ENCOUNTER — Ambulatory Visit
Admission: RE | Admit: 2018-12-09 | Discharge: 2018-12-09 | Disposition: A | Discharge status: Home / Self Care | Payer: 59 | Source: Ambulatory Visit | Attending: Family | Admitting: Family

## 2018-12-09 DIAGNOSIS — Z0000 Encounter for general adult medical examination without abnormal findings: Secondary | ICD-10-CM | POA: Insufficient documentation

## 2018-12-09 DIAGNOSIS — M8589 Other specified disorders of bone density and structure, multiple sites: Secondary | ICD-10-CM | POA: Diagnosis not present

## 2018-12-09 DIAGNOSIS — Z780 Asymptomatic menopausal state: Secondary | ICD-10-CM | POA: Diagnosis not present

## 2018-12-16 ENCOUNTER — Ambulatory Visit: Payer: 59 | Admitting: Family

## 2018-12-17 DIAGNOSIS — M170 Bilateral primary osteoarthritis of knee: Secondary | ICD-10-CM | POA: Diagnosis not present

## 2018-12-17 DIAGNOSIS — Z96652 Presence of left artificial knee joint: Secondary | ICD-10-CM | POA: Diagnosis not present

## 2019-01-18 ENCOUNTER — Encounter: Payer: Self-pay | Admitting: Pharmacist

## 2019-01-18 ENCOUNTER — Ambulatory Visit (INDEPENDENT_AMBULATORY_CARE_PROVIDER_SITE_OTHER): Payer: No Typology Code available for payment source | Admitting: Pharmacist

## 2019-01-18 VITALS — BP 133/77 | HR 81 | Wt 204.4 lb

## 2019-01-18 DIAGNOSIS — I10 Essential (primary) hypertension: Secondary | ICD-10-CM | POA: Diagnosis not present

## 2019-01-18 DIAGNOSIS — E785 Hyperlipidemia, unspecified: Secondary | ICD-10-CM | POA: Diagnosis not present

## 2019-01-18 DIAGNOSIS — E119 Type 2 diabetes mellitus without complications: Secondary | ICD-10-CM | POA: Diagnosis not present

## 2019-01-18 NOTE — Assessment & Plan Note (Signed)
#  ASCVD risk - primary prevention in patient aged 70-75 with DM: ASCVD risk >20%; moderate to high intensity statin indicated; LDL well controlled <70 - Continued Aspirin 81 mg  - Continued atorvastatin 10 mg daily

## 2019-01-18 NOTE — Progress Notes (Addendum)
S:     Chief Complaint  Patient presents with  . Medication Management    Diabetes    Patient arrives in good spirits, ambulating without assistance.  Presents for diabetes evaluation, education, and management at the request of Joycelyn Schmid Arnett/Dr. Derrel Nip in lab note on 11/18/2018. Last seen by PCP Mable Paris on that date - at that time, no medication changes were made. She had previously had Jardiance increased ~3 months ago. Otherwise, she notes that she has been on this regimen (max dose metformin + max dose Victoza + Jardiance) for a few years.   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 Two of her high school aged grandchildren lives with her; she picks her grandchildren up from school most days of the week  Patient reports Diabetes was diagnosed at least 35 years ago.    Insurance coverage/medication affordability: [facility] [employer]   Patient reports adherence with medications.  Current diabetes medications include: metformin 1000 mg BID, Victoza 1.8 mg daily, Jardiance 25 mg daily Current hypertension medications include: losartan 25 mg daily    Patient denies hypoglycemic s/sx including dizziness, shakiness, sweating. Patient reports hyperglycemic symptoms including polydipsia.   Patient reported dietary habits: Eats 3 meals/day Breakfast: Cereal + 1/2 banana, 1 tangerine; glass of milk Lunch: Salad; meat + vegetable at the hospital; sometimes has a sandwich  Dinner: Meat + vegetable; or just vegetables: Green beans, pinto/white beans; does not eat bread with meals Snacks: Occasionally peanut butter + graham cracker at work, but usually doesn't snack much  Drinks: Has been working down from half/half tea and trying to incorporate water; just drinking half/half tea at supper time  Desserts: rarely eats dessert; holidays were harder; tries to avoid going into the break room to avoid   Patient reported exercise habits: Was previously walking  three days a week, but limited now from knee pain - Plans to start some couch exercises from [employer] program through [facility]:  Physical Exam Vitals signs reviewed.  Constitutional:      Appearance: Normal appearance.  Neurological:     Mental Status: She is alert.  Psychiatric:        Mood and Affect: Mood normal.        Behavior: Behavior normal.    Review of Systems  All other systems reviewed and are negative.   Lab Results  Component Value Date   HGBA1C 7.2 (H) 11/18/2018   Vitals:   01/18/19 0856  BP: 133/77  Pulse: 81    Basic Metabolic Panel BMP Latest Ref Rng & Units 11/18/2018 06/17/2018 03/13/2018  Glucose 70 - 99 mg/dL 122(H) 134(H) 117(H)  BUN 6 - 23 mg/dL 17 17 12   Creatinine 0.40 - 1.20 mg/dL 0.55 0.51 0.53  Sodium 135 - 145 mEq/L 139 139 140  Potassium 3.5 - 5.1 mEq/L 4.1 4.1 3.8  Chloride 96 - 112 mEq/L 102 103 102  CO2 19 - 32 mEq/L 28 27 27   Calcium 8.4 - 10.5 mg/dL 9.4 9.4 9.7     Lipid Panel     Component Value Date/Time   CHOL 134 06/17/2018 0906   CHOL 102 06/28/2013 0711   TRIG 112.0 06/17/2018 0906   TRIG 81 06/28/2013 0711   HDL 48.90 06/17/2018 0906   HDL 44 06/28/2013 0711   CHOLHDL 3 06/17/2018 0906   VLDL 22.4 06/17/2018 0906   VLDL 16 06/28/2013 0711   LDLCALC  63 06/17/2018 0906   LDLCALC 42 06/28/2013 0711    Fasting SMBG: 119-150s Before supper: 140s typically  Clinical ASCVD: No ; The 10-year ASCVD risk score Mikey Bussing DC Jr., et al., 2013) is: 20.1%   Values used to calculate the score:     Age: 44 years     Sex: Female     Is Non-Hispanic African American: No     Diabetic: Yes     Tobacco smoker: No     Systolic Blood Pressure: 235 mmHg     Is BP treated: Yes     HDL Cholesterol: 48.9 mg/dL     Total Cholesterol: 134 mg/dL    A/P: Following discussion and approval by Joycelyn Schmid Arnett/Dr. Derrel Nip, the following medication changes were made:   #Diabetes - Currently uncontrolled, most recent A1c  7.2% on 11/18/2018, relatively stable over the past few years; Goal A1c <7% likely appropriate given functional level. Patient is adherent to diabetes medications as prescribed. Due to long history of diabetes, patient may be moving towards insulin dependence, given positive dietary choices. She is on maximal doses of current medications. Appropriate to check c-peptide; if low or negative, patient will require insulin therapy and may no longer derive the same benefit form non-insulin therapy  - Checked c-peptide today in clinic.  - Counseled on the importance of incorporating protein into meals and watching carbohydrate portion sizes. Provided with Healthy Meal Planning handout.  - Encouraged to check SMBG twice daily - fasting and 2 hour post prandial    #Hypertension currently uncontrolled. Goal BP <130/80 d/t ASCVD risk >15%. Patient reports adherence with medication. - Continue losartan 25 mg daily and verapamil 300 mg daily. Continue to monitor. Moving forward, consider dose increase of losartan.   #ASCVD risk - primary prevention in patient aged 26-75 with DM: ASCVD risk >20%; moderate to high intensity statin indicated; LDL well controlled <70 - Continued Aspirin 81 mg  - Continued atorvastatin 10 mg daily  #Medication Management - Reviewed recommendations for 1200 mg calcium + 250 199 1681 IU vitamin D supplementation in osteopenia. Patient likely receives ~800 mg daily of calcium from current dietary sources - Patient will check her multivitamin and evaluate if the calcium content; otherwise, will either add a second dairy source for ~400 mg dietary calcium, or add supplemental calcium. Recommended she choose calcium citrate over calcium carbonate/gluconate sources d/t concomitant PPI therapy that inhibits full absorption of calcium carbonate/gluconate  Written patient instructions provided.  Total time in face to face counseling 60 minutes.    Follow up in Pharmacist Clinic Visit 2-3 weeks.    Patient seen with Justice Deeds, PharmD Candidate  De Hollingshead, PharmD PGY2 Ambulatory Care Pharmacy Resident Phone: [phone] Agree with plan. Mable Paris, NP

## 2019-01-18 NOTE — Patient Instructions (Addendum)
It was great to meet you today!  We are not going to make any changes at this time, and see how things go for a few weeks.   Please check blood sugars 2-3 times daily, checking fastings and 2 hours after meals: Goal fasting readings are less than 130, goal 2 hours after meals is less than 180.   Today, we are going to check a lab called a c-peptide. This measures how much insulin your body is still making. If you are no longer making insulin, we know that you need external insulin.    It is recommended that you get about 1200 mg of calcium a day. One serving of dairy is about 400 mg, and you get about 300-400 mg daily from all other sources of food. Check how much calcium is in your multivitamin; if it is less than ~400 mg, I would recommend either eating a second serving of dairy a day, or adding on a calcium supplement in the evenings. Make sure you separate this from the morning glass of milk, as your body can only absorb about 500 mg of calcium at a time.   If you do get a calcium supplement, I recommend getting calcium citrate - the omeprazole can decrease absorption of calcium carbonate.   Schedule follow up with me in 2-3 weeks.

## 2019-01-18 NOTE — Assessment & Plan Note (Signed)
#  Diabetes - Currently uncontrolled, most recent A1c 7.2% on 11/18/2018, relatively stable over the past few years; Goal A1c <7% likely appropriate given functional level. Patient is adherent to diabetes medications as prescribed. Due to long history of diabetes, patient may be moving towards insulin dependence, given positive dietary choices. She is on maximal doses of current medications. Appropriate to check c-peptide; if low or negative, patient will require insulin therapy and may no longer derive the same benefit form non-insulin therapy  - Checked c-peptide today in clinic.  - Counseled on the importance of incorporating protein into meals and watching carbohydrate portion sizes. Provided with Healthy Meal Planning handout.  - Encouraged to check SMBG twice daily - fasting and 2 hour post prandial

## 2019-01-18 NOTE — Assessment & Plan Note (Signed)
#  Hypertension currently uncontrolled. Goal BP <130/80 d/t ASCVD risk >15%. Patient reports adherence with medication. - Continue losartan 25 mg daily and verapamil 300 mg daily. Continue to monitor. Moving forward, consider dose increase of losartan.

## 2019-01-19 LAB — C-PEPTIDE: C-Peptide: 7.56 ng/mL — ABNORMAL HIGH (ref 0.80–3.85)

## 2019-01-20 ENCOUNTER — Other Ambulatory Visit: Payer: Self-pay | Admitting: Family

## 2019-01-20 ENCOUNTER — Telehealth: Payer: Self-pay

## 2019-01-20 NOTE — Telephone Encounter (Signed)
I called & explained to patient that lab had not been resulted to me yet. I stated that when [facility] sent me a result note I would call her. Patient asked that if I did not get her at home to call her work number & ask for her.

## 2019-01-20 NOTE — Telephone Encounter (Signed)
Labs have not been resulted yet  Copied from [place] [id number]. Topic: General - Other >> Jan 20, 2019  4:17 PM Leward Quan A wrote: Reason for CRM: Patient called to request a call back with results from blood work from 01/18/2019. Please advise Ph# [phone]

## 2019-01-26 ENCOUNTER — Telehealth: Payer: Self-pay | Admitting: Pharmacist

## 2019-01-26 NOTE — Telephone Encounter (Signed)
At our visit on 01/18/2019, a c-peptide lab was checked to determine if the patient still had beta cell function and endogenous insulin production, due to her long history of diabetes and lack of glucose control on maximal doses of 3 antihyperglycemics (metformin, Jardiance, and Victoza). C-peptide resulted at 7.56 ng/mL (see lab on 01/18/2019). This indicates that the patient still has beta cell function and is still producing endogenous insulin.   Even if she had a low or negligible c-peptide level, my recommended next step in therapy would be basal insulin. However, if she had a low or negligible c-peptide level, it would indicate that she was likely not receiving much benefit from Victoza therapy, and we would be able to discontinue a possibly ineffective medication in this patient.   Contacted patient; left HIPAA compliant message for patient to return my call at her convenience. We have a follow up appointment schedule on 02/08/2019.   Catie Darnelle Maffucci, PharmD PGY2 Ambulatory Care Pharmacy Resident, [facility] Phone: [phone]

## 2019-01-29 NOTE — Telephone Encounter (Signed)
Contacted patient. Explained results of c-peptide. She expressed understanding. Follow up appointment with me scheduled 02/08/2019.   Catie Darnelle Maffucci, PharmD PGY2 Ambulatory Care Pharmacy Resident, [place] [facility] Phone: [phone]

## 2019-02-08 ENCOUNTER — Other Ambulatory Visit: Payer: Self-pay | Admitting: Family

## 2019-02-08 ENCOUNTER — Encounter: Payer: Self-pay | Admitting: Pharmacist

## 2019-02-08 ENCOUNTER — Ambulatory Visit: Payer: No Typology Code available for payment source | Admitting: Pharmacist

## 2019-02-08 ENCOUNTER — Ambulatory Visit (INDEPENDENT_AMBULATORY_CARE_PROVIDER_SITE_OTHER): Payer: No Typology Code available for payment source | Admitting: Pharmacist

## 2019-02-08 VITALS — BP 133/71 | HR 83 | Wt 201.6 lb

## 2019-02-08 DIAGNOSIS — E119 Type 2 diabetes mellitus without complications: Secondary | ICD-10-CM | POA: Diagnosis not present

## 2019-02-08 MED ORDER — INSULIN DEGLUDEC 100 UNIT/ML ~~LOC~~ SOPN: 10.0000 [IU] | PEN_INJECTOR | Freq: Every day | SUBCUTANEOUS | 2 refills | Status: DC

## 2019-02-08 MED ORDER — GLUCOSE BLOOD VI STRP: ORAL_STRIP | 12 refills | Status: DC

## 2019-02-08 NOTE — Assessment & Plan Note (Signed)
#  Diabetes - Currently uncontrolled, most recent A1c 7.2% on 11/18/2018, relatively stable over the past few years; Goal A1c <7%. Discussed risks of sulfonylureas and pioglitazone; patient amenable to starting basal insulin today.  - Start Vanessa Higgins (insulin degludec) 10 units once daily. Counseled to increase by 2 units every 3 days until fasting SMBG consistently less than 130 - Continue metformin 1000 mg BID, Victoza 1.8 mg daily, and Jardiance 25 mg daily. - Encouraged to continue to check fasting and one 2-hour post prandial reading daily, alternating between meals.

## 2019-02-08 NOTE — Patient Instructions (Signed)
It was great to see you today!  We are going to add a long acting insulin injection - Tresiba (insulin degludec) 10 units once daily. Continue this for 1 week. If you fasting blood sugars are still greater than 130, increase by 2 units to 12 units. Continue for 3 days. If fasting blood sugars are still greater than 130, increase by another 2 units to 14 units. Increase by 2 units every 3 days until fasting blood sugars are consistently less than 130.   Decrease insulin dose by 2 units if you experience low blood sugars.   Continue checking your blood sugar: 1) Fasting, before eating breakfast. The goal fasting blood sugar is less than 130 mg/dL 2) 2 hours after the largest meal of your day. The goal 2-hour after meal blood sugar is less than 180 mg/dL   Please feel free to contact clinic with any questions or concerns. Schedule follow up with me in 4 weeks.   Catie Darnelle Maffucci, PharmD

## 2019-02-08 NOTE — Progress Notes (Addendum)
S:     Chief Complaint  Patient presents with  . Medication Management    Diabetes    Patient arrives in good spirits, ambulating without assistance.  Presents for diabetes evaluation, education, and management at the request of Joycelyn Schmid Arnett/Dr. Derrel Nip in lab note on 11/18/2018. Last seen by PCP Mable Paris on that date - at that time, no medication changes were made. Last seen in [facility] on 01/18/2019- at that time, she was asked to check SMBG more often. A c-peptide was checked, and indicated that she still has beta-cell function.   Today, she brings extensive SMBG results that show fasting BG typically >130 and that increase throughout the day. 2 hour post prandials are typically <180. She notes that she would like to get tighter sugar control prior to her upcoming orthopedic surgery on 03/03/2019.    Insurance coverage/medication affordability: [facility]   Patient reports adherence with medications.  Current diabetes medications include: metformin 1000 mg BID, Victoza 1.8 mg daily, Jardiance 25 mg daily Current hypertension medications include: losartan 25 mg daily    Patient denies hypoglycemic s/sx including dizziness, shakiness, sweating. Patient reports hyperglycemic symptoms including polydipsia, though notes she likes to drink water.     O:  Physical Exam Vitals signs reviewed.  Constitutional:      Appearance: Normal appearance.  Neurological:     Mental Status: She is alert.  Psychiatric:        Mood and Affect: Mood normal.        Behavior: Behavior normal.    Review of Systems  All other systems reviewed and are negative.   Lab Results  Component Value Date   HGBA1C 7.2 (H) 11/18/2018   Vitals:   02/08/19 1624  BP: 133/71  Pulse: 83    Basic Metabolic Panel BMP Latest Ref Rng & Units 11/18/2018 06/17/2018 03/13/2018  Glucose 70 - 99 mg/dL 122(H) 134(H) 117(H)  BUN 6 - 23 mg/dL 17 17 12   Creatinine 0.40 - 1.20 mg/dL 0.55 0.51 0.53    Sodium 135 - 145 mEq/L 139 139 140  Potassium 3.5 - 5.1 mEq/L 4.1 4.1 3.8  Chloride 96 - 112 mEq/L 102 103 102  CO2 19 - 32 mEq/L 28 27 27   Calcium 8.4 - 10.5 mg/dL 9.4 9.4 9.7   C-peptide: 7.56 (01/18/2019)  Lipid Panel     Component Value Date/Time   CHOL 134 06/17/2018 0906   CHOL 102 06/28/2013 0711   TRIG 112.0 06/17/2018 0906   TRIG 81 06/28/2013 0711   HDL 48.90 06/17/2018 0906   HDL 44 06/28/2013 0711   CHOLHDL 3 06/17/2018 0906   VLDL 22.4 06/17/2018 0906   VLDL 16 06/28/2013 0711   LDLCALC 63 06/17/2018 0906   LDLCALC 42 06/28/2013 0711   Day Before Breakfast 2 hours after breakfast 2 Hours After Lunch 2 Hours After Supper  2-Feb 131 157 148 183  3-Feb  176 156 165  4-Feb 129 133  224  5-Feb 155 156 162 187  6-Feb 127 128 152 129  7-Feb 126  128 149  8-Feb 143 159 191 146  Avg 135 152 156 169    Clinical ASCVD: No ; The 10-year ASCVD risk score Mikey Bussing DC Jr., et al., 2013) is: 20.1%   Values used to calculate the score:     Age: 68 years     Sex: Female     Is Non-Hispanic African American: No     Diabetic: Yes  Tobacco smoker: No     Systolic Blood Pressure: 771 mmHg     Is BP treated: Yes     HDL Cholesterol: 48.9 mg/dL     Total Cholesterol: 134 mg/dL    A/P: #Diabetes - Currently uncontrolled, most recent A1c 7.2% on 11/18/2018, relatively stable over the past few years; Goal A1c <7%. Discussed risks of sulfonylureas and pioglitazone; patient amenable to starting basal insulin today.  - Start Tyler Aas (insulin degludec) 10 units once daily. Counseled to increase by 2 units every 3 days until fasting SMBG consistently less than 130 - Continue metformin 1000 mg BID, Victoza 1.8 mg daily, and Jardiance 25 mg daily. - Encouraged to continue to check fasting and one 2-hour post prandial reading daily, alternating between meals.    Written patient instructions provided.  Total time in face to face counseling 30 minutes.    Follow up in Pharmacist  Clinic phone call in 3 weeks prior to surgery.   De Hollingshead, PharmD PGY2 Ambulatory Care Pharmacy Resident Phone: [phone] Agree with plan. Mable Paris, NP   I have reviewed the above information and agree with above.   Deborra Medina, MD

## 2019-02-16 NOTE — Discharge Instructions (Signed)
°  Instructions after Total Knee Replacement ° ° Clarabell Matsuoka P. Jariel Drost, Jr., M.D.    ° Dept. of Orthopaedics & Sports Medicine ° [facility] ° [address] ° [place], [place]  [address] ° Phone: [phone]   Fax: [phone] ° °  °DIET: °• Drink plenty of non-alcoholic fluids. °• Resume your normal diet. Include foods high in fiber. ° °ACTIVITY:  °• You may use crutches or a walker with weight-bearing as tolerated, unless instructed otherwise. °• You may be weaned off of the walker or crutches by your Physical Therapist.  °• Do NOT place pillows under the knee. Anything placed under the knee could limit your ability to straighten the knee.   °• Continue doing gentle exercises. Exercising will reduce the pain and swelling, increase motion, and prevent muscle weakness.   °• Please continue to use the TED compression stockings for 6 weeks. You may remove the stockings at night, but should reapply them in the morning. °• Do not drive or operate any equipment until instructed. ° °WOUND CARE:  °• Continue to use the PolarCare or ice packs periodically to reduce pain and swelling. °• You may bathe or shower after the staples are removed at the first office visit following surgery. ° °MEDICATIONS: °• You may resume your regular medications. °• Please take the pain medication as prescribed on the medication. °• Do not take pain medication on an empty stomach. °• You have been given a prescription for a blood thinner (Lovenox or Coumadin). Please take the medication as instructed. (NOTE: After completing a 2 week course of Lovenox, take one Enteric-coated aspirin once a day. This along with elevation will help reduce the possibility of phlebitis in your operated leg.) °• Do not drive or drink alcoholic beverages when taking pain medications. ° °CALL THE OFFICE FOR: °• Temperature above 101 degrees °• Excessive bleeding or drainage on the dressing. °• Excessive swelling, coldness, or paleness of the toes. °• Persistent  nausea and vomiting. ° °FOLLOW-UP:  °• You should have an appointment to return to the office in 10-14 days after surgery. °• Arrangements have been made for continuation of Physical Therapy (either home therapy or outpatient therapy). °  °

## 2019-02-18 ENCOUNTER — Other Ambulatory Visit: Payer: Self-pay

## 2019-02-18 ENCOUNTER — Encounter
Admission: RE | Admit: 2019-02-18 | Discharge: 2019-02-18 | Disposition: A | Discharge status: Home / Self Care | Payer: No Typology Code available for payment source | Source: Ambulatory Visit | Attending: Orthopedic Surgery | Admitting: Orthopedic Surgery

## 2019-02-18 DIAGNOSIS — G4733 Obstructive sleep apnea (adult) (pediatric): Secondary | ICD-10-CM | POA: Insufficient documentation

## 2019-02-18 DIAGNOSIS — I10 Essential (primary) hypertension: Secondary | ICD-10-CM | POA: Insufficient documentation

## 2019-02-18 DIAGNOSIS — Z01818 Encounter for other preprocedural examination: Secondary | ICD-10-CM | POA: Insufficient documentation

## 2019-02-18 DIAGNOSIS — E119 Type 2 diabetes mellitus without complications: Secondary | ICD-10-CM | POA: Diagnosis not present

## 2019-02-18 LAB — SURGICAL PCR SCREEN
MRSA, PCR: NEGATIVE
Staphylococcus aureus: POSITIVE — AB

## 2019-02-18 LAB — C-REACTIVE PROTEIN: CRP: <0.8 mg/dL (ref <1.0)

## 2019-02-18 LAB — CBC
HCT: 35.3 % — ABNORMAL LOW (ref 36.0–46.0)
Hemoglobin: 10.5 g/dL — ABNORMAL LOW (ref 12.0–15.0)
MCH: 23.8 pg — ABNORMAL LOW (ref 26.0–34.0)
MCHC: 29.7 g/dL — ABNORMAL LOW (ref 30.0–36.0)
MCV: 79.9 fL — ABNORMAL LOW (ref 80.0–100.0)
Platelets: 365 10*3/uL (ref 150–400)
RBC: 4.42 MIL/uL (ref 3.87–5.11)
RDW: 18.3 % — ABNORMAL HIGH (ref 11.5–15.5)
WBC: 6.7 10*3/uL (ref 4.0–10.5)
nRBC: 0 % (ref 0.0–0.2)

## 2019-02-18 LAB — COMPREHENSIVE METABOLIC PANEL
ALT: 17 U/L (ref 0–44)
AST: 20 U/L (ref 15–41)
Albumin: 4.2 g/dL (ref 3.5–5.0)
Alkaline Phosphatase: 41 U/L (ref 38–126)
Anion gap: 10 (ref 5–15)
BUN: 16 mg/dL (ref 8–23)
CO2: 26 mmol/L (ref 22–32)
Calcium: 9 mg/dL (ref 8.9–10.3)
Chloride: 102 mmol/L (ref 98–111)
Creatinine, Ser: 0.57 mg/dL (ref 0.44–1.00)
GFR calc Af Amer: >60 mL/min (ref >60)
GFR calc non Af Amer: >60 mL/min (ref >60)
Glucose, Bld: 96 mg/dL (ref 70–99)
Potassium: 3.6 mmol/L (ref 3.5–5.1)
Sodium: 138 mmol/L (ref 135–145)
Total Bilirubin: 1 mg/dL (ref 0.3–1.2)
Total Protein: 7.4 g/dL (ref 6.5–8.1)

## 2019-02-18 LAB — URINALYSIS, ROUTINE W REFLEX MICROSCOPIC
Bilirubin Urine: NEGATIVE
Glucose, UA: >=500 mg/dL — AB
Hgb urine dipstick: NEGATIVE
Ketones, ur: NEGATIVE mg/dL
Leukocytes,Ua: NEGATIVE
Nitrite: NEGATIVE
Protein, ur: NEGATIVE mg/dL
Specific Gravity, Urine: 1.03 (ref 1.005–1.030)
pH: 5 (ref 5.0–8.0)

## 2019-02-18 LAB — TYPE AND SCREEN
ABO/RH(D): A POS
Antibody Screen: NEGATIVE

## 2019-02-18 LAB — SEDIMENTATION RATE: Sed Rate: 24 mm/hr (ref 0–30)

## 2019-02-18 LAB — APTT: aPTT: 44 seconds — ABNORMAL HIGH (ref 24–36)

## 2019-02-18 LAB — PROTIME-INR
INR: 1.07
Prothrombin Time: 13.8 seconds (ref 11.4–15.2)

## 2019-02-18 NOTE — Pre-Procedure Instructions (Signed)
UA FAXED TO DR HOOTEN. EKG COMPARED WITH OLD

## 2019-02-18 NOTE — Patient Instructions (Addendum)
  Your procedure is scheduled on: Wednesday March 03, 2019 Report to Same Day Surgery 2nd floor [facility] Entrance-take elevator on left to 2nd floor.  Check in with surgery information desk.) To find out your arrival time, call [phone] 1:00-3:00 PM on Tuesday March 02, 2019  Remember: Instructions that are not followed completely may result in serious medical risk, up to and including death, or upon the discretion of your surgeon and anesthesiologist your surgery may need to be rescheduled.    __x__ 1. Do not eat food (including mints, candies, chewing gum) after midnight the night before your procedure. You may drink water up to 2 hours before you are scheduled to arrive at the hospital for your procedure.  Do not drink anything within 2 hours of your scheduled arrival to the hospital.   __x__ 2. No Alcohol for 24 hours before or after surgery.   __x__ 3. No Smoking or e-cigarettes for 24 hours before surgery.  Do not use any chewable tobacco products for at least 6 hours before surgery.   __x__ 4. Notify your doctor if there is any change in your medical condition (cold, fever, infections).   __x__ 5. On the morning of surgery brush your teeth with toothpaste and water.  You may rinse your mouth with mouthwash if you wish.  Do not swallow any toothpaste or mouthwash.  Please read over the following fact sheets that you were given:   [facility] Preparing for Surgery and/or MRSA Information    __x__ Use CHG Soap or Sage wipes as directed on instruction sheet.   Do not wear jewelry, make-up, hairpins, clips or nail polish on the day of surgery.  Do not wear lotions, powders, deodorant, or perfumes.   Do not shave for 48 hours prior to surgery.   Do not bring valuables to the hospital.    [facility] is not responsible for any belongings or valuables.               Contacts, dentures or bridgework may not be worn into surgery.  Leave your suitcase in the car. After  surgery it may be brought to your room.  For patients admitted to the hospital, discharge time is determined by your treatment team.  __x__ Take these medicines on the morning of surgery with a SMALL SIP OF WATER:  1. Loratadine/Claritin  2. Omeprazole/Prilosec  3. Venlafaxine/Effexor  Skip your Losartan on the morning of surgery.  __x__ Use inhalers (Albuterol and Advair) on the day of surgery and bring them with you to the hospital.  __x__ Bring C-Pap/Bi-Pap machine to the hospital.   __x__ Stop Metformin 2 days before surgery (Last dose Sunday February 28, 2019).    __x__ Take half of your usual insulin dose the night before surgery and do not take any insulin on the morning of surgery.   __x__ Follow recommendations from Cardiologist, Pulmonologist or PCP regarding stopping blood thinners such as Aspirin, Coumadin, Plavix, Eliquis, Effient, Pradaxa, and Pletal.  __x__ TODAY: Stop Anti-inflammatories such as Advil, Ibuprofen, Motrin, Aleve, Naproxen, Naprosyn, BC/Goodies powders or aspirin products. You may continue to take Tylenol and Celebrex.   __x__ TODAY: Stop supplements until after surgery. You may continue to take Vitamin D, Vitamin B, and multivitamin.

## 2019-02-19 LAB — HEMOGLOBIN A1C
Hgb A1c MFr Bld: 6.8 % — ABNORMAL HIGH (ref 4.8–5.6)
Mean Plasma Glucose: 148 mg/dL

## 2019-02-19 NOTE — Pre-Procedure Instructions (Signed)
Urine Cx results faxed to Dr. Clydell Hakim office.

## 2019-02-19 NOTE — Pre-Procedure Instructions (Signed)
HgbA1C results sent to Dr. Hooten for review. 

## 2019-02-20 LAB — URINE CULTURE
Culture: >=100000 — AB
Special Requests: NORMAL

## 2019-02-26 ENCOUNTER — Other Ambulatory Visit: Payer: Self-pay | Admitting: *Deleted

## 2019-02-26 NOTE — Patient Outreach (Signed)
[facility]) Care Management  02/26/2019  Vanessa Higgins 1951/12/17 [id number]  Preoperative Screening Call Referral received: 02/26/19 Surgery date: 03/03/19 Initial outreach: 02/26/19 Insurance: [place]  Initial unsuccessful telephone call to patient's home number in order to complete pre-op screening; no answer, left HIPAA compliant voicemail message requesting return call.   Objective: Per the electronic medical record, [name] is scheduled for right knee arthoplasty  on 03/03/19 at [facility]. She completed his/her pre-op admission testing visit on 02/18/19. Comorbidities include: Type 2 DM, arthritis, migraine headaches, obstructive sleep apnea, depression, fatty liver, HTN, Hyperlipidemia, asthma, and obesity  Plan: If patient does not return call, this RNCM will call patient for transition of care outreach within 72 hours of hospital discharge notification.  Barrington Ellison RN,CCM,CDE [place] Management Coordinator Office Phone [phone] Office Fax [phone]

## 2019-03-01 ENCOUNTER — Other Ambulatory Visit: Payer: Self-pay | Admitting: *Deleted

## 2019-03-01 NOTE — Patient Outreach (Signed)
[facility]) Care Management  03/01/2019  Vanessa Higgins 04/13/1951 [id number]  Preoperative Screening Call Referral received: 02/26/19 Surgery/Procedure date: 03/03/19 Initial outreach: 02/26/19 Insurance: [place]     Second unsuccessful telephone call, this time to patient's mobile number,  in order to complete pre-op screening; no answer, left HIPAA compliant voicemail message requesting return call.   Objective: Per the electronic medical record, [name] is scheduled for right knee arthoplasty  on 03/03/19 at [facility]. She completed his/her pre-op admission testing visit on 02/18/19. Comorbidities include: Type 2 DM, arthritis, migraine headaches, obstructive sleep apnea, depression, fatty liver, HTN, Hyperlipidemia, asthma, and obesity  Plan: If patient does not return call, this RNCM will call patient for transition of care outreach within 72 hours of hospital discharge notification.  Barrington Ellison RN,CCM,CDE [place] Management Coordinator Office Phone [phone] Office Fax [phone]

## 2019-03-02 ENCOUNTER — Telehealth: Payer: Self-pay | Admitting: Pharmacist

## 2019-03-02 ENCOUNTER — Other Ambulatory Visit: Payer: Self-pay | Admitting: *Deleted

## 2019-03-02 MED ORDER — TRANEXAMIC ACID-NACL 1000-0.7 MG/100ML-% IV SOLN
1000.0000 mg | INTRAVENOUS | Status: AC
  Administered 2019-03-03: 1000 mg via INTRAVENOUS
  Filled 2019-03-02: qty 100

## 2019-03-02 MED ORDER — CEFAZOLIN SODIUM-DEXTROSE 2-4 GM/100ML-% IV SOLN
2.0000 g | INTRAVENOUS | Status: AC
  Administered 2019-03-03: 2 g via INTRAVENOUS

## 2019-03-02 NOTE — Patient Outreach (Addendum)
[facility]) Care Management  03/02/2019  Vanessa Higgins 05-29-1951 [id number]  Subjective: Telephone call to patient at her work number. 2 HIPAA identifiers verified. Discussed purpose of pre-op call. Patient voices understanding and agrees to pre-op call.  She states she has completed her medical leave paperwork. She says she does have the hospital indemnity benefit and is aware she will have to file the claim after her surgery. She says she will have 24/7 care at home by her daughter to assist in her recovery. She has completed an advanced directive and established a health care power of attorney.  She agrees to a post hospital discharge transition of care call.     Objective:  Per chart review, patient completed her preadmission testing appointment on 02/18/19. She is scheduled for a right total knee arthoplasty on 03/03/19 at [facility].    Assessment: Preoperative call completed; no preoperative needs identified.  Patient voices good understanding of surgery, arrival time to hospital, pre-op instructions, recovery plan and anticipated hospital stay.    Plan: RNCM will call patient for transition of care outreach within 72 hours of hospital discharge notification.     Barrington Ellison RN, CCM, CDE [facility] Phone [phone] Office Fax [phone]

## 2019-03-02 NOTE — Telephone Encounter (Signed)
Contacted patient to follow up on blood sugars since initiation of basal insulin at last appointment on 02/08/2019.   She reports the following fasting sugars: 131, 133, 131, 127, 150 (ties this to snacks the night before); and evenings (unknown time after supper) 132, 130, 116, 121, 130, 127, 111. She has titrated up to [place] 22 units daily as directed. She continues Victoza 1.8 mg daily, Jardiance 25 mg daily, and metformin 1000 mg BID.   TKA scheduled tomorrow. Recommended patient continue Tresiba 22 units, and reevaluate BG results once she is home from surgery. She notes that she will be unable to drive for ~ 6 weeks after surgery. Encouraged her that as she recovers, to evaluate her schedule and schedule follow up with me in clinic around the end of April.   Catie Darnelle Maffucci, PharmD, [place] PGY2 Ambulatory Care Pharmacy Resident, [facility] Phone: [phone]

## 2019-03-03 ENCOUNTER — Inpatient Hospital Stay
Admission: RE | Admit: 2019-03-03 | Discharge: 2019-03-05 | DRG: 470 | Disposition: A | Discharge status: Home Health | Payer: No Typology Code available for payment source | Attending: Orthopedic Surgery | Admitting: Orthopedic Surgery

## 2019-03-03 ENCOUNTER — Inpatient Hospital Stay: Payer: No Typology Code available for payment source | Admitting: Anesthesiology

## 2019-03-03 ENCOUNTER — Encounter: Payer: Self-pay | Admitting: Orthopedic Surgery

## 2019-03-03 ENCOUNTER — Inpatient Hospital Stay: Payer: No Typology Code available for payment source

## 2019-03-03 ENCOUNTER — Other Ambulatory Visit: Payer: Self-pay

## 2019-03-03 ENCOUNTER — Encounter
Admission: RE | Disposition: A | Discharge status: Home Health | Payer: Self-pay | Source: Home / Self Care | Attending: Orthopedic Surgery

## 2019-03-03 DIAGNOSIS — K219 Gastro-esophageal reflux disease without esophagitis: Secondary | ICD-10-CM | POA: Diagnosis present

## 2019-03-03 DIAGNOSIS — E890 Postprocedural hypothyroidism: Secondary | ICD-10-CM | POA: Diagnosis present

## 2019-03-03 DIAGNOSIS — Z79899 Other long term (current) drug therapy: Secondary | ICD-10-CM | POA: Diagnosis not present

## 2019-03-03 DIAGNOSIS — Z86010 Personal history of colonic polyps: Secondary | ICD-10-CM

## 2019-03-03 DIAGNOSIS — Z96652 Presence of left artificial knee joint: Secondary | ICD-10-CM | POA: Diagnosis present

## 2019-03-03 DIAGNOSIS — Z96659 Presence of unspecified artificial knee joint: Secondary | ICD-10-CM

## 2019-03-03 DIAGNOSIS — Z860101 Personal history of adenomatous and serrated colon polyps: Secondary | ICD-10-CM | POA: Insufficient documentation

## 2019-03-03 DIAGNOSIS — E559 Vitamin D deficiency, unspecified: Secondary | ICD-10-CM | POA: Diagnosis present

## 2019-03-03 DIAGNOSIS — M1711 Unilateral primary osteoarthritis, right knee: Principal | ICD-10-CM | POA: Diagnosis present

## 2019-03-03 DIAGNOSIS — E785 Hyperlipidemia, unspecified: Secondary | ICD-10-CM | POA: Diagnosis present

## 2019-03-03 DIAGNOSIS — J449 Chronic obstructive pulmonary disease, unspecified: Secondary | ICD-10-CM | POA: Diagnosis present

## 2019-03-03 DIAGNOSIS — Z7984 Long term (current) use of oral hypoglycemic drugs: Secondary | ICD-10-CM

## 2019-03-03 DIAGNOSIS — Z882 Allergy status to sulfonamides status: Secondary | ICD-10-CM | POA: Diagnosis not present

## 2019-03-03 DIAGNOSIS — E119 Type 2 diabetes mellitus without complications: Secondary | ICD-10-CM | POA: Diagnosis present

## 2019-03-03 DIAGNOSIS — G4733 Obstructive sleep apnea (adult) (pediatric): Secondary | ICD-10-CM | POA: Diagnosis present

## 2019-03-03 DIAGNOSIS — M25561 Pain in right knee: Secondary | ICD-10-CM | POA: Diagnosis present

## 2019-03-03 DIAGNOSIS — I10 Essential (primary) hypertension: Secondary | ICD-10-CM | POA: Diagnosis present

## 2019-03-03 HISTORY — PX: KNEE ARTHROPLASTY: SHX992

## 2019-03-03 LAB — ABO/RH: ABO/RH(D): A POS

## 2019-03-03 LAB — GLUCOSE, CAPILLARY
Glucose-Capillary: 180 mg/dL — ABNORMAL HIGH (ref 70–99)
Glucose-Capillary: 180 mg/dL — ABNORMAL HIGH (ref 70–99)
Glucose-Capillary: 184 mg/dL — ABNORMAL HIGH (ref 70–99)
Glucose-Capillary: 168 mg/dL — ABNORMAL HIGH (ref 70–99)

## 2019-03-03 SURGERY — ARTHROPLASTY, KNEE, TOTAL, USING IMAGELESS COMPUTER-ASSISTED NAVIGATION
Anesthesia: Spinal | Site: Knee | Laterality: Right

## 2019-03-03 MED ORDER — BUPIVACAINE HCL (PF) 0.25 % IJ SOLN
INTRAMUSCULAR | Status: DC | PRN
  Administered 2019-03-03: 60 mL

## 2019-03-03 MED ORDER — ENOXAPARIN SODIUM 30 MG/0.3ML ~~LOC~~ SOLN
30.0000 mg | Freq: Two times a day (BID) | SUBCUTANEOUS | Status: DC
  Administered 2019-03-04 – 2019-03-05 (×3): 30 mg via SUBCUTANEOUS
  Filled 2019-03-03 (×3): qty 0.3

## 2019-03-03 MED ORDER — METOCLOPRAMIDE HCL 10 MG PO TABS
10.0000 mg | ORAL_TABLET | Freq: Three times a day (TID) | ORAL | Status: DC
  Administered 2019-03-03 – 2019-03-05 (×7): 10 mg via ORAL
  Filled 2019-03-03 (×7): qty 1

## 2019-03-03 MED ORDER — MONTELUKAST SODIUM 10 MG PO TABS
10.0000 mg | ORAL_TABLET | Freq: Every day | ORAL | Status: DC
  Administered 2019-03-03 – 2019-03-04 (×2): 10 mg via ORAL
  Filled 2019-03-03 (×2): qty 1

## 2019-03-03 MED ORDER — MIDAZOLAM HCL 2 MG/2ML IJ SOLN
INTRAMUSCULAR | Status: AC
  Filled 2019-03-03: qty 2

## 2019-03-03 MED ORDER — SODIUM CHLORIDE 0.9 % IV SOLN
INTRAVENOUS | Status: DC
  Administered 2019-03-03: 07:00:00 via INTRAVENOUS

## 2019-03-03 MED ORDER — ONDANSETRON HCL 4 MG PO TABS: 4.0000 mg | ORAL_TABLET | Freq: Four times a day (QID) | ORAL | Status: DC | PRN

## 2019-03-03 MED ORDER — GABAPENTIN 300 MG PO CAPS
300.0000 mg | ORAL_CAPSULE | Freq: Once | ORAL | Status: AC
  Administered 2019-03-03: 300 mg via ORAL

## 2019-03-03 MED ORDER — SODIUM CHLORIDE 0.9 % IV SOLN
INTRAVENOUS | Status: DC | PRN
  Administered 2019-03-03: 60 mL

## 2019-03-03 MED ORDER — INSULIN GLARGINE 100 UNIT/ML ~~LOC~~ SOLN
22.0000 [IU] | Freq: Every day | SUBCUTANEOUS | Status: DC
  Administered 2019-03-03 – 2019-03-04 (×2): 22 [IU] via SUBCUTANEOUS
  Filled 2019-03-03 (×3): qty 0.22

## 2019-03-03 MED ORDER — ACETAMINOPHEN 10 MG/ML IV SOLN
INTRAVENOUS | Status: AC
  Filled 2019-03-03: qty 100

## 2019-03-03 MED ORDER — MECLIZINE HCL 25 MG PO TABS
25.0000 mg | ORAL_TABLET | Freq: Three times a day (TID) | ORAL | Status: DC | PRN
  Filled 2019-03-03: qty 1

## 2019-03-03 MED ORDER — VERAPAMIL HCL ER 120 MG PO TBCR
120.0000 mg | EXTENDED_RELEASE_TABLET | Freq: Every day | ORAL | Status: DC
  Administered 2019-03-03 – 2019-03-05 (×3): 120 mg via ORAL
  Filled 2019-03-03 (×3): qty 1

## 2019-03-03 MED ORDER — INSULIN ASPART 100 UNIT/ML ~~LOC~~ SOLN
0.0000 [IU] | Freq: Three times a day (TID) | SUBCUTANEOUS | Status: DC
  Administered 2019-03-03 – 2019-03-04 (×2): 3 [IU] via SUBCUTANEOUS
  Administered 2019-03-04 – 2019-03-05 (×2): 2 [IU] via SUBCUTANEOUS
  Filled 2019-03-03 (×4): qty 1

## 2019-03-03 MED ORDER — PROPOFOL 500 MG/50ML IV EMUL
INTRAVENOUS | Status: AC
  Filled 2019-03-03: qty 50

## 2019-03-03 MED ORDER — METFORMIN HCL 500 MG PO TABS
1000.0000 mg | ORAL_TABLET | Freq: Two times a day (BID) | ORAL | Status: DC
  Administered 2019-03-03 – 2019-03-05 (×4): 1000 mg via ORAL
  Filled 2019-03-03 (×4): qty 2

## 2019-03-03 MED ORDER — SENNOSIDES-DOCUSATE SODIUM 8.6-50 MG PO TABS
1.0000 | ORAL_TABLET | Freq: Two times a day (BID) | ORAL | Status: DC
  Administered 2019-03-03 – 2019-03-04 (×2): 1 via ORAL
  Filled 2019-03-03 (×2): qty 1

## 2019-03-03 MED ORDER — ACETAMINOPHEN 10 MG/ML IV SOLN
1000.0000 mg | Freq: Four times a day (QID) | INTRAVENOUS | Status: AC
  Administered 2019-03-03 – 2019-03-04 (×3): 1000 mg via INTRAVENOUS
  Filled 2019-03-03 (×3): qty 100

## 2019-03-03 MED ORDER — ATORVASTATIN CALCIUM 10 MG PO TABS
10.0000 mg | ORAL_TABLET | Freq: Every day | ORAL | Status: DC
  Administered 2019-03-03 – 2019-03-04 (×2): 10 mg via ORAL
  Filled 2019-03-03 (×2): qty 1

## 2019-03-03 MED ORDER — HYDROMORPHONE HCL 1 MG/ML IJ SOLN
0.5000 mg | INTRAMUSCULAR | Status: DC | PRN
  Filled 2019-03-03: qty 1

## 2019-03-03 MED ORDER — TRAMADOL HCL 50 MG PO TABS
50.0000 mg | ORAL_TABLET | ORAL | Status: DC | PRN
  Administered 2019-03-03 (×2): 100 mg via ORAL
  Filled 2019-03-03 (×2): qty 2

## 2019-03-03 MED ORDER — ONDANSETRON HCL 4 MG/2ML IJ SOLN
INTRAMUSCULAR | Status: DC | PRN
  Administered 2019-03-03: 4 mg via INTRAVENOUS

## 2019-03-03 MED ORDER — SODIUM CHLORIDE 0.9 % IV SOLN
INTRAVENOUS | Status: DC | PRN
  Administered 2019-03-03: 15 ug/min via INTRAVENOUS

## 2019-03-03 MED ORDER — HYDROMORPHONE HCL 1 MG/ML IJ SOLN: 0.2500 mg | INTRAMUSCULAR | Status: DC | PRN

## 2019-03-03 MED ORDER — VITAMIN D 25 MCG (1000 UNIT) PO TABS
1000.0000 [IU] | ORAL_TABLET | Freq: Every day | ORAL | Status: DC
  Administered 2019-03-04 – 2019-03-05 (×2): 1000 [IU] via ORAL
  Filled 2019-03-03 (×2): qty 1

## 2019-03-03 MED ORDER — PANTOPRAZOLE SODIUM 40 MG PO TBEC
40.0000 mg | DELAYED_RELEASE_TABLET | Freq: Two times a day (BID) | ORAL | Status: DC
  Administered 2019-03-03 – 2019-03-05 (×4): 40 mg via ORAL
  Filled 2019-03-03 (×4): qty 1

## 2019-03-03 MED ORDER — SODIUM CHLORIDE 0.9 % IV SOLN
INTRAVENOUS | Status: DC | PRN
  Administered 2019-03-03: 3500 mL

## 2019-03-03 MED ORDER — GABAPENTIN 300 MG PO CAPS
300.0000 mg | ORAL_CAPSULE | Freq: Every day | ORAL | Status: DC
  Administered 2019-03-03 – 2019-03-04 (×2): 300 mg via ORAL
  Filled 2019-03-03 (×2): qty 1

## 2019-03-03 MED ORDER — OXYCODONE HCL 5 MG PO TABS
5.0000 mg | ORAL_TABLET | ORAL | Status: DC | PRN
  Administered 2019-03-03: 5 mg via ORAL
  Filled 2019-03-03: qty 1

## 2019-03-03 MED ORDER — PHENOL 1.4 % MT LIQD
1.0000 | OROMUCOSAL | Status: DC | PRN
  Filled 2019-03-03: qty 177

## 2019-03-03 MED ORDER — CHLORHEXIDINE GLUCONATE 4 % EX LIQD: 60.0000 mL | Freq: Once | CUTANEOUS | Status: DC

## 2019-03-03 MED ORDER — METOCLOPRAMIDE HCL 10 MG PO TABS: 5.0000 mg | ORAL_TABLET | Freq: Three times a day (TID) | ORAL | Status: DC | PRN

## 2019-03-03 MED ORDER — CEFAZOLIN SODIUM-DEXTROSE 2-4 GM/100ML-% IV SOLN
2.0000 g | Freq: Four times a day (QID) | INTRAVENOUS | Status: AC
  Administered 2019-03-03 – 2019-03-04 (×4): 2 g via INTRAVENOUS
  Filled 2019-03-03 (×4): qty 100

## 2019-03-03 MED ORDER — CELECOXIB 200 MG PO CAPS
200.0000 mg | ORAL_CAPSULE | Freq: Two times a day (BID) | ORAL | Status: DC
  Administered 2019-03-03 – 2019-03-05 (×4): 200 mg via ORAL
  Filled 2019-03-03 (×4): qty 1

## 2019-03-03 MED ORDER — CELECOXIB 200 MG PO CAPS
400.0000 mg | ORAL_CAPSULE | Freq: Once | ORAL | Status: AC
  Administered 2019-03-03: 400 mg via ORAL

## 2019-03-03 MED ORDER — TETRACAINE HCL 1 % IJ SOLN
INTRAMUSCULAR | Status: DC | PRN
  Administered 2019-03-03: 5 mg via INTRASPINAL

## 2019-03-03 MED ORDER — CELECOXIB 200 MG PO CAPS
ORAL_CAPSULE | ORAL | Status: AC
  Administered 2019-03-03: 400 mg via ORAL
  Filled 2019-03-03: qty 2

## 2019-03-03 MED ORDER — CEFAZOLIN SODIUM-DEXTROSE 2-4 GM/100ML-% IV SOLN
INTRAVENOUS | Status: AC
  Filled 2019-03-03: qty 100

## 2019-03-03 MED ORDER — LACTATED RINGERS IV SOLN
INTRAVENOUS | Status: DC | PRN
  Administered 2019-03-03: 10:00:00 via INTRAVENOUS

## 2019-03-03 MED ORDER — FLEET ENEMA 7-19 GM/118ML RE ENEM: 1.0000 | ENEMA | Freq: Once | RECTAL | Status: DC | PRN

## 2019-03-03 MED ORDER — LIRAGLUTIDE 18 MG/3ML ~~LOC~~ SOPN: 1.8000 mg | PEN_INJECTOR | Freq: Every day | SUBCUTANEOUS | Status: DC

## 2019-03-03 MED ORDER — METOCLOPRAMIDE HCL 5 MG/ML IJ SOLN: 5.0000 mg | Freq: Three times a day (TID) | INTRAMUSCULAR | Status: DC | PRN

## 2019-03-03 MED ORDER — SODIUM CHLORIDE 0.9 % IV SOLN
INTRAVENOUS | Status: DC
  Administered 2019-03-03: 15:00:00 via INTRAVENOUS

## 2019-03-03 MED ORDER — ACETAMINOPHEN 10 MG/ML IV SOLN
INTRAVENOUS | Status: DC | PRN
  Administered 2019-03-03: 1000 mg via INTRAVENOUS

## 2019-03-03 MED ORDER — GENTAMICIN SULFATE 40 MG/ML IJ SOLN
INTRAMUSCULAR | Status: AC
  Filled 2019-03-03: qty 8

## 2019-03-03 MED ORDER — ALBUTEROL SULFATE (2.5 MG/3ML) 0.083% IN NEBU: 2.5000 mg | INHALATION_SOLUTION | Freq: Four times a day (QID) | RESPIRATORY_TRACT | Status: DC | PRN

## 2019-03-03 MED ORDER — LORATADINE 10 MG PO TABS
10.0000 mg | ORAL_TABLET | Freq: Every day | ORAL | Status: DC
  Administered 2019-03-04 – 2019-03-05 (×2): 10 mg via ORAL
  Filled 2019-03-03 (×2): qty 1

## 2019-03-03 MED ORDER — OXYCODONE HCL 5 MG PO TABS: 10.0000 mg | ORAL_TABLET | ORAL | Status: DC | PRN

## 2019-03-03 MED ORDER — INSULIN DEGLUDEC 100 UNIT/ML ~~LOC~~ SOPN: 22.0000 [IU] | PEN_INJECTOR | Freq: Every day | SUBCUTANEOUS | Status: DC

## 2019-03-03 MED ORDER — MIDAZOLAM HCL 5 MG/5ML IJ SOLN
INTRAMUSCULAR | Status: DC | PRN
  Administered 2019-03-03: 2 mg via INTRAVENOUS

## 2019-03-03 MED ORDER — MENTHOL 3 MG MT LOZG
1.0000 | LOZENGE | OROMUCOSAL | Status: DC | PRN
  Filled 2019-03-03: qty 9

## 2019-03-03 MED ORDER — ONDANSETRON HCL 4 MG/2ML IJ SOLN: 4.0000 mg | Freq: Four times a day (QID) | INTRAMUSCULAR | Status: DC | PRN

## 2019-03-03 MED ORDER — DEXAMETHASONE SODIUM PHOSPHATE 10 MG/ML IJ SOLN
INTRAMUSCULAR | Status: AC
  Administered 2019-03-03: 8 mg via INTRAVENOUS
  Filled 2019-03-03: qty 1

## 2019-03-03 MED ORDER — LOSARTAN POTASSIUM 25 MG PO TABS
25.0000 mg | ORAL_TABLET | Freq: Every day | ORAL | Status: DC
  Administered 2019-03-04 – 2019-03-05 (×2): 25 mg via ORAL
  Filled 2019-03-03 (×2): qty 1

## 2019-03-03 MED ORDER — PROPOFOL 10 MG/ML IV BOLUS
INTRAVENOUS | Status: DC | PRN
  Administered 2019-03-03: 30 mg via INTRAVENOUS

## 2019-03-03 MED ORDER — B COMPLEX-C PO TABS
1.0000 | ORAL_TABLET | Freq: Every day | ORAL | Status: DC
  Administered 2019-03-04 – 2019-03-05 (×2): 1 via ORAL
  Filled 2019-03-03 (×3): qty 1

## 2019-03-03 MED ORDER — BISACODYL 10 MG RE SUPP: 10.0000 mg | Freq: Every day | RECTAL | Status: DC | PRN

## 2019-03-03 MED ORDER — BUPIVACAINE HCL (PF) 0.5 % IJ SOLN
INTRAMUSCULAR | Status: DC | PRN
  Administered 2019-03-03: 2.5 mL

## 2019-03-03 MED ORDER — PROPOFOL 500 MG/50ML IV EMUL
INTRAVENOUS | Status: DC | PRN
  Administered 2019-03-03: 75 ug/kg/min via INTRAVENOUS

## 2019-03-03 MED ORDER — VERAPAMIL HCL ER 300 MG PO CP24: 300.0000 mg | ORAL_CAPSULE | Freq: Every day | ORAL | Status: DC

## 2019-03-03 MED ORDER — VERAPAMIL HCL ER 180 MG PO TBCR
180.0000 mg | EXTENDED_RELEASE_TABLET | Freq: Every day | ORAL | Status: DC
  Administered 2019-03-03 – 2019-03-05 (×3): 180 mg via ORAL
  Filled 2019-03-03 (×4): qty 1

## 2019-03-03 MED ORDER — FERROUS SULFATE 325 (65 FE) MG PO TABS
325.0000 mg | ORAL_TABLET | Freq: Two times a day (BID) | ORAL | Status: DC
  Administered 2019-03-03 – 2019-03-05 (×4): 325 mg via ORAL
  Filled 2019-03-03 (×4): qty 1

## 2019-03-03 MED ORDER — VENLAFAXINE HCL ER 150 MG PO CP24
150.0000 mg | ORAL_CAPSULE | Freq: Every day | ORAL | Status: DC
  Administered 2019-03-04 – 2019-03-05 (×2): 150 mg via ORAL
  Filled 2019-03-03 (×2): qty 1

## 2019-03-03 MED ORDER — BUPIVACAINE HCL (PF) 0.25 % IJ SOLN
INTRAMUSCULAR | Status: AC
  Filled 2019-03-03: qty 30

## 2019-03-03 MED ORDER — ACETAMINOPHEN 325 MG PO TABS: 325.0000 mg | ORAL_TABLET | Freq: Four times a day (QID) | ORAL | Status: DC | PRN

## 2019-03-03 MED ORDER — DIPHENHYDRAMINE HCL 12.5 MG/5ML PO ELIX: 12.5000 mg | ORAL_SOLUTION | ORAL | Status: DC | PRN

## 2019-03-03 MED ORDER — MOMETASONE FURO-FORMOTEROL FUM 200-5 MCG/ACT IN AERO
2.0000 | INHALATION_SPRAY | Freq: Two times a day (BID) | RESPIRATORY_TRACT | Status: DC
  Administered 2019-03-03 – 2019-03-05 (×4): 2 via RESPIRATORY_TRACT
  Filled 2019-03-03: qty 8.8

## 2019-03-03 MED ORDER — GABAPENTIN 300 MG PO CAPS
ORAL_CAPSULE | ORAL | Status: AC
  Administered 2019-03-03: 300 mg via ORAL
  Filled 2019-03-03: qty 1

## 2019-03-03 MED ORDER — TRANEXAMIC ACID-NACL 1000-0.7 MG/100ML-% IV SOLN
1000.0000 mg | Freq: Once | INTRAVENOUS | Status: AC
  Administered 2019-03-03: 1000 mg via INTRAVENOUS
  Filled 2019-03-03: qty 100

## 2019-03-03 MED ORDER — ADULT MULTIVITAMIN W/MINERALS CH
1.0000 | ORAL_TABLET | Freq: Every day | ORAL | Status: DC
  Administered 2019-03-04 – 2019-03-05 (×2): 1 via ORAL
  Filled 2019-03-03 (×2): qty 1

## 2019-03-03 MED ORDER — DEXAMETHASONE SODIUM PHOSPHATE 10 MG/ML IJ SOLN
8.0000 mg | Freq: Once | INTRAMUSCULAR | Status: AC
  Administered 2019-03-03: 8 mg via INTRAVENOUS

## 2019-03-03 MED ORDER — MAGNESIUM HYDROXIDE 400 MG/5ML PO SUSP
30.0000 mL | Freq: Every day | ORAL | Status: DC
  Administered 2019-03-03 – 2019-03-04 (×2): 30 mL via ORAL
  Filled 2019-03-03 (×2): qty 30

## 2019-03-03 MED ORDER — ALUM & MAG HYDROXIDE-SIMETH 200-200-20 MG/5ML PO SUSP: 30.0000 mL | ORAL | Status: DC | PRN

## 2019-03-03 SURGICAL SUPPLY — 69 items
BATTERY INSTRU NAVIGATION (MISCELLANEOUS) ×8 IMPLANT
BLADE SAW 70X12.5 (BLADE) ×2 IMPLANT
BLADE SAW 90X13X1.19 OSCILLAT (BLADE) ×2 IMPLANT
BLADE SAW 90X25X1.19 OSCILLAT (BLADE) ×2 IMPLANT
BONE CEMENT GENTAMICIN (Cement) ×4 IMPLANT
BTRY SRG DRVR LF (MISCELLANEOUS) ×4
CANISTER SUCT 1200ML W/VALVE (MISCELLANEOUS) ×2 IMPLANT
CANISTER SUCT 3000ML PPV (MISCELLANEOUS) ×4 IMPLANT
CEMENT BONE GENTAMICIN 40 (Cement) IMPLANT
CEMENT TIBIA MBT (Knees) IMPLANT
COOLER POLAR GLACIER W/PUMP (MISCELLANEOUS) ×2 IMPLANT
COVER WAND RF STERILE (DRAPES) ×2 IMPLANT
CUFF TOURN 24 STER (MISCELLANEOUS) IMPLANT
CUFF TOURN 30 STER DUAL PORT (MISCELLANEOUS) IMPLANT
DRAPE SHEET LG 3/4 BI-LAMINATE (DRAPES) ×2 IMPLANT
DRSG DERMACEA 8X12 NADH (GAUZE/BANDAGES/DRESSINGS) ×2 IMPLANT
DRSG OPSITE POSTOP 4X14 (GAUZE/BANDAGES/DRESSINGS) ×2 IMPLANT
DRSG TEGADERM 4X4.75 (GAUZE/BANDAGES/DRESSINGS) ×2 IMPLANT
DURAPREP 26ML APPLICATOR (WOUND CARE) ×4 IMPLANT
ELECT REM PT RETURN 9FT ADLT (ELECTROSURGICAL) ×2
ELECTRODE REM PT RTRN 9FT ADLT (ELECTROSURGICAL) ×1 IMPLANT
EX-PIN ORTHOLOCK NAV 4X150 (PIN) ×4 IMPLANT
FEMUR SIGMA PS SZ 3.0 R (Femur) ×1 IMPLANT
GLOVE BIOGEL M STRL SZ7.5 (GLOVE) ×4 IMPLANT
GLOVE INDICATOR 8.0 STRL GRN (GLOVE) ×2 IMPLANT
GOWN STRL REUS W/ TWL LRG LVL3 (GOWN DISPOSABLE) ×2 IMPLANT
GOWN STRL REUS W/TWL LRG LVL3 (GOWN DISPOSABLE) ×4
HEMOVAC 400CC 10FR (MISCELLANEOUS) ×2 IMPLANT
HOLDER FOLEY CATH W/STRAP (MISCELLANEOUS) ×2 IMPLANT
HOOD PEEL AWAY FLYTE STAYCOOL (MISCELLANEOUS) ×4 IMPLANT
INSERT TIBIAL PFC SIG SZ3 10MM (Knees) ×1 IMPLANT
KIT TURNOVER KIT A (KITS) ×2 IMPLANT
KNIFE SCULPS 14X20 (INSTRUMENTS) ×2 IMPLANT
LABEL OR SOLS (LABEL) ×2 IMPLANT
MANIFOLD NEPTUNE WASTE (CANNULA) ×2 IMPLANT
NDL SAFETY ECLIPSE 18X1.5 (NEEDLE) ×1 IMPLANT
NDL SPNL 20GX3.5 QUINCKE YW (NEEDLE) ×2 IMPLANT
NEEDLE HYPO 18GX1.5 SHARP (NEEDLE) ×2
NEEDLE SPNL 20GX3.5 QUINCKE YW (NEEDLE) ×4 IMPLANT
NS IRRIG 500ML POUR BTL (IV SOLUTION) ×2 IMPLANT
PACK TOTAL KNEE (MISCELLANEOUS) ×2 IMPLANT
PAD WRAPON POLAR KNEE (MISCELLANEOUS) ×1 IMPLANT
PATELLA DOME PFC 35MM (Knees) ×1 IMPLANT
PENCIL SMOKE ULTRAEVAC 22 CON (MISCELLANEOUS) ×2 IMPLANT
PIN DRILL QUICK PACK ×2 IMPLANT
PIN FIXATION 1/8DIA X 3INL (PIN) ×6 IMPLANT
PULSAVAC PLUS IRRIG FAN TIP (DISPOSABLE) ×2
SOL .9 NS 3000ML IRR  AL (IV SOLUTION) ×1
SOL .9 NS 3000ML IRR AL (IV SOLUTION) ×1
SOL .9 NS 3000ML IRR UROMATIC (IV SOLUTION) ×1 IMPLANT
SOL PREP PVP 2OZ (MISCELLANEOUS) ×2
SOLUTION PREP PVP 2OZ (MISCELLANEOUS) ×1 IMPLANT
SPONGE DRAIN TRACH 4X4 STRL 2S (GAUZE/BANDAGES/DRESSINGS) ×2 IMPLANT
STAPLER SKIN PROX 35W (STAPLE) ×2 IMPLANT
STOCKINETTE IMPERV 14X48 (MISCELLANEOUS) IMPLANT
STRAP TIBIA SHORT (MISCELLANEOUS) ×2 IMPLANT
SUCTION FRAZIER HANDLE 10FR (MISCELLANEOUS) ×1
SUCTION TUBE FRAZIER 10FR DISP (MISCELLANEOUS) ×1 IMPLANT
SUT VIC AB 0 CT1 36 (SUTURE) ×2 IMPLANT
SUT VIC AB 1 CT1 36 (SUTURE) ×4 IMPLANT
SUT VIC AB 2-0 CT2 27 (SUTURE) ×2 IMPLANT
SYR 20CC LL (SYRINGE) ×2 IMPLANT
SYR 30ML LL (SYRINGE) ×4 IMPLANT
TIBIA MBT CEMENT (Knees) ×2 IMPLANT
TIP FAN IRRIG PULSAVAC PLUS (DISPOSABLE) ×1 IMPLANT
TOWEL OR 17X26 4PK STRL BLUE (TOWEL DISPOSABLE) ×2 IMPLANT
TOWER CARTRIDGE SMART MIX (DISPOSABLE) ×2 IMPLANT
TRAY FOLEY MTR SLVR 16FR STAT (SET/KITS/TRAYS/PACK) ×2 IMPLANT
WRAPON POLAR PAD KNEE (MISCELLANEOUS) ×2

## 2019-03-03 NOTE — H&P (Signed)
The patient has been re-examined, and the chart reviewed, and there have been no interval changes to the documented history and physical.    The risks, benefits, and alternatives have been discussed at length. The patient expressed understanding of the risks benefits and agreed with plans for surgical intervention.  Vanessa Higgins, Jr. M.D.    

## 2019-03-03 NOTE — Anesthesia Procedure Notes (Signed)
Spinal  Patient location during procedure: OR Start time: 03/03/2019 7:20 AM End time: 03/03/2019 7:24 AM Staffing Resident/CRNA: Nelda Marseille, CRNA Performed: resident/CRNA  Preanesthetic Checklist Completed: patient identified, site marked, surgical consent, pre-op evaluation, timeout performed, IV checked, risks and benefits discussed and monitors and equipment checked Spinal Block Patient position: sitting Prep: Betadine Patient monitoring: heart rate, continuous pulse ox, blood pressure and cardiac monitor Approach: midline Location: L4-5 Injection technique: single-shot Needle Needle type: Whitacre and Introducer  Needle gauge: 25 G Needle length: 9 cm Assessment Sensory level: T10 Additional Notes Negative paresthesia. Negative blood return. Positive free-flowing CSF. Expiration date of kit checked and confirmed. Patient tolerated procedure well, without complications.

## 2019-03-03 NOTE — Op Note (Signed)
OPERATIVE NOTE  DATE OF SURGERY:  03/03/2019  PATIENT NAME:  Vanessa Higgins   DOB: [birth year]  MRN: [id number]  PRE-OPERATIVE DIAGNOSIS: Degenerative arthrosis of the right knee, primary  POST-OPERATIVE DIAGNOSIS:  Same  PROCEDURE:  Right total knee arthroplasty using computer-assisted navigation  SURGEON:  Marciano Sequin. M.D.  ASSISTANT: Liliane Bade, PA-C (present and scrubbed throughout the case, critical for assistance with exposure, retraction, instrumentation, and closure)  ANESTHESIA: spinal  ESTIMATED BLOOD LOSS: 50 mL  FLUIDS REPLACED: 1600 mL of crystalloid  TOURNIQUET TIME: 99 minutes  DRAINS: 2 medium Hemovac drains  SOFT TISSUE RELEASES: Anterior cruciate ligament, posterior cruciate ligament, deep medial collateral ligament, patellofemoral ligament  IMPLANTS UTILIZED: DePuy PFC Sigma size 3 posterior stabilized femoral component (cemented), size 3 MBT tibial component (cemented), 35 mm 3 peg oval dome patella (cemented), and a 10 mm stabilized rotating platform polyethylene insert.  INDICATIONS FOR SURGERY: Vanessa Higgins is a 68 y.o. year old female with a long history of progressive knee pain. X-rays demonstrated severe degenerative changes in tricompartmental fashion. The patient had not seen any significant improvement despite conservative nonsurgical intervention. After discussion of the risks and benefits of surgical intervention, the patient expressed understanding of the risks benefits and agree with plans for total knee arthroplasty.   The risks, benefits, and alternatives were discussed at length including but not limited to the risks of infection, bleeding, nerve injury, stiffness, blood clots, the need for revision surgery, cardiopulmonary complications, among others, and they were willing to proceed.  PROCEDURE IN DETAIL: The patient was brought into the operating room and, after adequate spinal anesthesia was achieved, a tourniquet was placed on the  patient's upper thigh. The patient's knee and leg were cleaned and prepped with alcohol and DuraPrep and draped in the usual sterile fashion. A "timeout" was performed as per usual protocol. The lower extremity was exsanguinated using an Esmarch, and the tourniquet was inflated to 300 mmHg. An anterior longitudinal incision was made followed by a standard mid vastus approach. The deep fibers of the medial collateral ligament were elevated in a subperiosteal fashion off of the medial flare of the tibia so as to maintain a continuous soft tissue sleeve. The patella was subluxed laterally and the patellofemoral ligament was incised. Inspection of the knee demonstrated severe degenerative changes with full-thickness loss of articular cartilage. Osteophytes were debrided using a rongeur. Anterior and posterior cruciate ligaments were excised. Two 4.0 mm Schanz pins were inserted in the femur and into the tibia for attachment of the array of trackers used for computer-assisted navigation. Hip center was identified using a circumduction technique. Distal landmarks were mapped using the computer. The distal femur and proximal tibia were mapped using the computer. The distal femoral cutting guide was positioned using computer-assisted navigation so as to achieve a 5 distal valgus cut. The femur was sized and it was felt that a size 3 femoral component was appropriate. A size 3 femoral cutting guide was positioned and the anterior cut was performed and verified using the computer. This was followed by completion of the posterior and chamfer cuts. Femoral cutting guide for the central box was then positioned in the center box cut was performed.  Attention was then directed to the proximal tibia. Medial and lateral menisci were excised. The extramedullary tibial cutting guide was positioned using computer-assisted navigation so as to achieve a 0 varus-valgus alignment and 0 posterior slope. The cut was performed and  verified using the computer. The proximal  tibia was sized and it was felt that a size 3 tibial tray was appropriate. Tibial and femoral trials were inserted followed by insertion of a 10 mm polyethylene insert. This allowed for excellent mediolateral soft tissue balancing both in flexion and in full extension. Finally, the patella was cut and prepared so as to accommodate a 35 mm 3 peg oval dome patella. A patella trial was placed and the knee was placed through a range of motion with excellent patellar tracking appreciated. The femoral trial was removed after debridement of posterior osteophytes. The central post-hole for the tibial component was reamed followed by insertion of a keel punch. Tibial trials were then removed. Cut surfaces of bone were irrigated with copious amounts of normal saline with antibiotic solution using pulsatile lavage and then suctioned dry. Polymethylmethacrylate cement with gentamicin was prepared in the usual fashion using a vacuum mixer. Cement was applied to the cut surface of the proximal tibia as well as along the undersurface of a size 3 MBT tibial component. Tibial component was positioned and impacted into place. Excess cement was removed using [employer] fast streamer. Cement was then applied to the cut surfaces of the femur as well as along the posterior flanges of the size 3 femoral component. The femoral component was positioned and impacted into place. Excess cement was removed using [employer] fast streamer. A 10 mm polyethylene trial was inserted and the knee was brought into full extension with steady axial compression applied. Finally, cement was applied to the backside of a 35 mm 3 peg oval dome patella and the patellar component was positioned and patellar clamp applied. Excess cement was removed using [employer] fast streamer. After adequate curing of the cement, the tourniquet was deflated after a total tourniquet time of 99 minutes. Hemostasis was achieved using electrocautery. The knee was  irrigated with copious amounts of normal saline with antibiotic solution using pulsatile lavage and then suctioned dry. 20 mL of 1.3% Exparel and 60 mL of 0.25% Marcaine in 40 mL of normal saline was injected along the posterior capsule, medial and lateral gutters, and along the arthrotomy site. A 10 mm stabilized rotating platform polyethylene insert was inserted and the knee was placed through a range of motion with excellent mediolateral soft tissue balancing appreciated and excellent patellar tracking noted. 2 medium drains were placed in the wound bed and brought out through separate stab incisions. The medial parapatellar portion of the incision was reapproximated using interrupted sutures of #1 Vicryl. Subcutaneous tissue was approximated in layers using first #0 Vicryl followed #2-0 Vicryl. The skin was approximated with skin staples. A sterile dressing was applied.  The patient tolerated the procedure well and was transported to the recovery room in stable condition.    Raschelle Wisenbaker P. Holley Bouche., M.D.

## 2019-03-03 NOTE — Anesthesia Post-op Follow-up Note (Signed)
Anesthesia QCDR form completed.        

## 2019-03-03 NOTE — Anesthesia Preprocedure Evaluation (Addendum)
Anesthesia Evaluation  Patient identified by MRN, date of birth, ID band Patient awake    Reviewed: Allergy & Precautions, H&P , NPO status , Patient's Chart, lab work & pertinent test results  History of Anesthesia Complications (+) PONV and history of anesthetic complications  Airway Mallampati: III       Dental  (+) Teeth Intact   Pulmonary asthma (well controlled) , sleep apnea ,           Cardiovascular hypertension, negative cardio ROS       Neuro/Psych  Headaches, PSYCHIATRIC DISORDERS Depression S/p discectomy, laminectomy lower back    GI/Hepatic Neg liver ROS, GERD  Controlled,  Endo/Other  diabetes  Renal/GU      Musculoskeletal  (+) Arthritis ,   Abdominal   Peds  Hematology  (+) Blood dyscrasia, anemia ,   Anesthesia Other Findings Past Medical History: No date: Allergy     Comment:  hay fever 04/08/2014: Ankle fracture, left No date: Arthritis No date: Asthma No date: Blood transfusion abn reaction or complication, no procedure  mishap No date: Depression No date: Diabetes mellitus 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, lumbar No date: Fatty liver No date: GERD (gastroesophageal reflux disease) No date: Headache(784.0) No date: Hyperlipidemia No date: Hypertension No date: Migraine No date: Obstructive sleep apnea     Comment:  CPAP No date: PONV (postoperative nausea and vomiting) 04/06/2017: Sleep apnea No date: UTI (lower urinary tract infection) No date: Wears glasses  Past Surgical History: 1977: APPENDECTOMY No date: COLONOSCOPY 02/06/2018: LUMBAR LAMINECTOMY/DECOMPRESSION MICRODISCECTOMY; Right     Comment:  Procedure: Lumbar two Hemilaminectomy for Discectomy;                Surgeon: Ashok Pall, MD;  Location: [facility];  Service:               Neurosurgery;  Laterality: Right; 04/08/2014: ORIF ANKLE FRACTURE; Left     Comment:  Procedure: OPEN REDUCTION INTERNAL FIXATION (ORIF) ANKLE              FRACTURE LEFT ANKLE FRACTURE OPEN TREATMENT BIMALLEOLAR               ANKLE INCLUDES INTERNAL FIXATION;  Surgeon: Johnny Bridge, MD;  Location: [facility];                Service: Orthopedics;  Laterality: Left; 1980: PAROTIDECTOMY     Comment:  left No date: TONSILLECTOMY AND ADENOIDECTOMY     Comment:  as a child 09/2011: TOTAL KNEE ARTHROPLASTY     Comment:  left 05/1976: TUBAL LIGATION     Reproductive/Obstetrics negative OB ROS                            Anesthesia Physical Anesthesia Plan  ASA: III  Anesthesia Plan: Spinal   Post-op Pain Management:    Induction:   PONV Risk Score and Plan: Propofol infusion  Airway Management Planned:   Additional Equipment:   Intra-op Plan:   Post-operative Plan:   Informed Consent: I have reviewed the patients History and Physical, chart, labs and discussed the procedure including the risks, benefits and alternatives for the proposed anesthesia with the patient or authorized representative who has indicated his/her understanding and acceptance.     Dental Advisory Given  Plan Discussed with: Anesthesiologist and CRNA  Anesthesia Plan Comments:  Anesthesia Quick Evaluation  

## 2019-03-03 NOTE — Anesthesia Procedure Notes (Signed)
Date/Time: 03/03/2019 8:00 AM Performed by: Nelda Marseille, CRNA Pre-anesthesia Checklist: Patient identified, Emergency Drugs available, Suction available, Patient being monitored and Timeout performed Oxygen Delivery Method: Simple face mask

## 2019-03-03 NOTE — Evaluation (Signed)
Physical Therapy Evaluation Patient Details Name: Vanessa Higgins MRN: [id number] DOB: December 13, 1951 Today's Date: 03/03/2019   History of Present Illness  Pt is a 68 yo female diagnosed with degenerative arthrosis of the right knee and is s/p elective R TKA.  PMH includes: HTN, depression, GERD, DM, OSA, lumbar laminectomy/decompression 01/2018, and R ankle ORIF.      Clinical Impression  Pt presents with deficits in strength, transfers, mobility, gait, R knee ROM, and activity tolerance but overall performed very well during the session especially considering POD#0 status.  Pt required only extra time and effort with bed mobility tasks and no physical assistance.  Pt was able to perform sit to/from stand transfers from various height surfaces with good eccentric and concentric control and stability.  Pt was able to take several steps at the EOB and then amb from bed to chair with a RW without instability or R knee buckling.  Pt should progress well while in acute care and will benefit from HHPT services upon discharge to safely address above deficits for decreased caregiver assistance and eventual return to PLOF.      Follow Up Recommendations Home health PT    Equipment Recommendations  3in1 (PT)    Recommendations for Other Services       Precautions / Restrictions Precautions Precautions: Fall;Knee Precaution Comments: Pt able to perform Ind RLE SLRs without extensor lag, no KI required Restrictions Weight Bearing Restrictions: Yes RLE Weight Bearing: Weight bearing as tolerated      Mobility  Bed Mobility Overal bed mobility: Modified Independent             General bed mobility comments: Extra time and effort required but did not need physical assistance  Transfers Overall transfer level: Needs assistance Equipment used: Rolling walker (2 wheeled) Transfers: Sit to/from Stand Sit to Stand: Min guard         General transfer comment: Min verbal cues for  sequencing  Ambulation/Gait Ambulation/Gait assistance: Min guard Gait Distance (Feet): 3 Feet Assistive device: Rolling walker (2 wheeled) Gait Pattern/deviations: Step-to pattern;Decreased step length - left;Decreased stance time - right Gait velocity: decreased   General Gait Details: Pt steady with amb in room at EOB without LOB or R knee buckling  Stairs            Wheelchair Mobility    Modified Rankin (Stroke Patients Only)       Balance Overall balance assessment: No apparent balance deficits (not formally assessed)                                           Pertinent Vitals/Pain Pain Assessment: 0-10 Pain Score: 1  Pain Location: R knee Pain Descriptors / Indicators: Sore Pain Intervention(s): Monitored during session    Home Living Family/patient expects to be discharged to:: Private residence Living Arrangements: Children;Other relatives(grandchildren) Available Help at Discharge: Available PRN/intermittently;Available 24 hours/day;Family(Temporary 24 hr assistance upon discharge from acute care) Type of Home: House Home Access: Ramped entrance     Home Layout: One level Home Equipment: Walker - 2 wheels;Cane - single point      Prior Function Level of Independence: Independent         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[employer], 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, 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, Ind with ADLs     Hand Dominance        Extremity/Trunk Assessment   Upper  Extremity Assessment Upper Extremity Assessment: Defer to OT evaluation    Lower Extremity Assessment Lower Extremity Assessment: Generalized weakness;RLE deficits/detail RLE Deficits / Details: R hip flex >/= 3/5, R ankle DF/PF strong against manual resistance RLE: Unable to fully assess due to pain RLE Sensation: WNL       Communication   Communication: No difficulties  Cognition Arousal/Alertness: Awake/alert Behavior During Therapy: WFL for tasks assessed/performed Overall  Cognitive Status: Within Functional Limits for tasks assessed                                        General Comments      Exercises Total Joint Exercises Ankle Circles/Pumps: AROM;Strengthening;Both;10 reps Quad Sets: AROM;Right;Strengthening;10 reps;5 reps Gluteal Sets: Strengthening;Both;10 reps Straight Leg Raises: AROM;Right;10 reps Long Arc Quad: AROM;Right;5 reps;10 reps Knee Flexion: AROM;Right;5 reps;10 reps Goniometric ROM: R knee AROM: 0-85 deg Marching in Standing: AROM;Both;10 reps Other Exercises Other Exercises: Positioning education provided Other Exercises: HEP education provided per handout   Assessment/Plan    PT Assessment Patient needs continued PT services  PT Problem List Decreased strength;Decreased range of motion;Decreased activity tolerance;Decreased balance;Decreased mobility;Decreased knowledge of use of DME       PT Treatment Interventions DME instruction;Gait training;Stair training;Functional mobility training;Therapeutic activities;Therapeutic exercise;Balance training;Patient/family education    PT Goals (Current goals can be found in the Care Plan section)  Acute Rehab PT Goals Patient Stated Goal: Improved balance and walking PT Goal Formulation: With patient Time For Goal Achievement: 03/16/19 Potential to Achieve Goals: Good    Frequency BID   Barriers to discharge        Co-evaluation               AM-PAC PT "6 Clicks" Mobility  Outcome Measure Help needed turning from your back to your side while in a flat bed without using bedrails?: A Little Help needed moving from lying on your back to sitting on the side of a flat bed without using bedrails?: A Little Help needed moving to and from a bed to a chair (including a wheelchair)?: A Little Help needed standing up from a chair using your arms (e.g., wheelchair or bedside chair)?: A Little Help needed to walk in hospital room?: A Little Help needed climbing  3-5 steps with a railing? : A Little 6 Click Score: 18    End of Session Equipment Utilized During Treatment: Gait belt Activity Tolerance: Patient tolerated treatment well;No increased pain Patient left: in chair;with chair alarm set;with call bell/phone within reach;with SCD's reapplied;Other (comment);with family/visitor present(Polar care donned to R knee) Nurse Communication: Mobility status PT Visit Diagnosis: Muscle weakness (generalized) (M62.81);Other abnormalities of gait and mobility (R26.89);Pain Pain - Right/Left: Right Pain - part of body: Knee    Time: 1520-1600 PT Time Calculation (min) (ACUTE ONLY): 40 min   Charges:   PT Evaluation $PT Eval Low Complexity: 1 Low PT Treatments $Therapeutic Exercise: 8-22 mins $Therapeutic Activity: 8-22 mins        D. Scott Clennon Nasca PT, DPT 03/03/19, 4:18 PM

## 2019-03-03 NOTE — Transfer of Care (Signed)
Immediate Anesthesia Transfer of Care Note  Patient: Vanessa Higgins  Procedure(s) Performed: COMPUTER ASSISTED TOTAL KNEE ARTHROPLASTY-RIGHT (Right Knee)  Patient Location: PACU  Anesthesia Type:Spinal  Level of Consciousness: awake, alert  and oriented  Airway & Oxygen Therapy: Patient Spontanous Breathing  Post-op Assessment: Report given to RN and Post -op Vital signs reviewed and stable  Post vital signs: Reviewed and stable  Last Vitals:  Vitals Value Taken Time  BP 102/53 03/03/2019 10:51 AM  Temp    Pulse 72 03/03/2019 10:51 AM  Resp 10 03/03/2019 10:51 AM  SpO2 94 % 03/03/2019 10:51 AM  Vitals shown include unvalidated device data.  Last Pain:  Vitals:   03/03/19 0617  TempSrc: Tympanic  PainSc: 0-No pain         Complications: No apparent anesthesia complications

## 2019-03-03 NOTE — Plan of Care (Signed)

## 2019-03-04 LAB — GLUCOSE, CAPILLARY
Glucose-Capillary: 181 mg/dL — ABNORMAL HIGH (ref 70–99)
Glucose-Capillary: 146 mg/dL — ABNORMAL HIGH (ref 70–99)
Glucose-Capillary: 141 mg/dL — ABNORMAL HIGH (ref 70–99)
Glucose-Capillary: 94 mg/dL (ref 70–99)
Glucose-Capillary: 137 mg/dL — ABNORMAL HIGH (ref 70–99)

## 2019-03-04 MED ORDER — CELECOXIB 200 MG PO CAPS: 200.0000 mg | ORAL_CAPSULE | Freq: Two times a day (BID) | ORAL | 1 refills | Status: DC

## 2019-03-04 MED ORDER — TRAMADOL HCL 50 MG PO TABS: 50.0000 mg | ORAL_TABLET | ORAL | 1 refills | Status: DC | PRN

## 2019-03-04 MED ORDER — ENOXAPARIN SODIUM 40 MG/0.4ML ~~LOC~~ SOLN: 40.0000 mg | SUBCUTANEOUS | 0 refills | Status: DC

## 2019-03-04 MED ORDER — OXYCODONE HCL 5 MG PO TABS: 5.0000 mg | ORAL_TABLET | ORAL | 0 refills | Status: DC | PRN

## 2019-03-04 NOTE — Progress Notes (Signed)
Physical Therapy Treatment Patient Details Name: Vanessa Higgins MRN: [id number] DOB: 05/22/1951 Today's Date: 03/04/2019    History of Present Illness Pt is a 68 yo female diagnosed with degenerative arthrosis of the right knee and is s/p elective R TKA.  PMH includes: HTN, depression, GERD, DM, OSA, lumbar laminectomy/decompression 01/2018, and R ankle ORIF.      PT Comments    Pt ready for gait.  Stood with ease and was able to walk around unit and back to her room with steady gait, no increased pain.  Participated in exercises as described below.  Returned to bed after session.   Follow Up Recommendations  Home health PT     Equipment Recommendations       Recommendations for Other Services       Precautions / Restrictions Precautions Precautions: Fall;Knee Precaution Comments: Pt able to perform Ind RLE SLRs without extensor lag, no KI required Restrictions Weight Bearing Restrictions: Yes RLE Weight Bearing: Weight bearing as tolerated    Mobility  Bed Mobility Overal bed mobility: Modified Independent                Transfers Overall transfer level: Needs assistance Equipment used: Rolling walker (2 wheeled) Transfers: Sit to/from Stand Sit to Stand: Supervision;From elevated surface         General transfer comment: Good control and stability with min verbal cues for sequencing.  Ambulation/Gait Ambulation/Gait assistance: Min guard Gait Distance (Feet): 230 Feet Assistive device: Rolling walker (2 wheeled) Gait Pattern/deviations: Step-through pattern Gait velocity: decreased       Stairs             Wheelchair Mobility    Modified Rankin (Stroke Patients Only)       Balance Overall balance assessment: No apparent balance deficits (not formally assessed)                                          Cognition Arousal/Alertness: Awake/alert Behavior During Therapy: WFL for tasks assessed/performed Overall Cognitive  Status: Within Functional Limits for tasks assessed                                        Exercises Total Joint Exercises Long Arc Quad: AROM;Right;10 reps;15 reps Knee Flexion: AROM;Right;10 reps;15 reps Goniometric ROM: 0-92 despite bulky dressing    General Comments        Pertinent Vitals/Pain Pain Assessment: 0-10 Pain Score: 2  Pain Location: R knee Pain Descriptors / Indicators: Sore Pain Intervention(s): Limited activity within patient's tolerance;Repositioned;Monitored during session    Home Living                      Prior Function            PT Goals (current goals can now be found in the care plan section) Progress towards PT goals: Progressing toward goals    Frequency    BID      PT Plan Current plan remains appropriate    Co-evaluation              AM-PAC PT "6 Clicks" Mobility   Outcome Measure  Help needed turning from your back to your side while in a flat bed without using bedrails?: A Little Help needed moving from lying  on your back to sitting on the side of a flat bed without using bedrails?: A Little Help needed moving to and from a bed to a chair (including a wheelchair)?: A Little Help needed standing up from a chair using your arms (e.g., wheelchair or bedside chair)?: A Little Help needed to walk in hospital room?: A Little Help needed climbing 3-5 steps with a railing? : A Little 6 Click Score: 18    End of Session Equipment Utilized During Treatment: Gait belt Activity Tolerance: Patient tolerated treatment well;No increased pain Patient left: in bed;with call bell/phone within reach;with bed alarm set   Pain - Right/Left: Right Pain - part of body: Knee     Time: 1349-1400 PT Time Calculation (min) (ACUTE ONLY): 11 min  Charges:  $Gait Training: 8-22 mins                     Chesley Noon, PTA 03/04/19, 2:46 PM

## 2019-03-04 NOTE — Anesthesia Postprocedure Evaluation (Signed)
Anesthesia Post Note  Patient: Vanessa Higgins  Procedure(s) Performed: COMPUTER ASSISTED TOTAL KNEE ARTHROPLASTY-RIGHT (Right Knee)  Patient location during evaluation: Nursing Unit Anesthesia Type: Spinal Level of consciousness: oriented and awake and alert Pain management: pain level controlled Vital Signs Assessment: post-procedure vital signs reviewed and stable Respiratory status: spontaneous breathing and respiratory function stable Cardiovascular status: blood pressure returned to baseline and stable Postop Assessment: no headache, no backache, no apparent nausea or vomiting and patient able to bend at knees Anesthetic complications: no     Last Vitals:  Vitals:   03/03/19 2315 03/04/19 0449  BP: 114/63 (!) 107/50  Pulse: 69 65  Resp: 17 17  Temp: 36.4 C 36.4 C  SpO2: 93% 93%    Last Pain:  Vitals:   03/04/19 0449  TempSrc: Oral  PainSc:                  Brantley Fling

## 2019-03-04 NOTE — Progress Notes (Signed)
Physical Therapy Treatment Patient Details Name: Vanessa Higgins MRN: [id number] DOB: 03/24/1951 Today's Date: 03/04/2019    History of Present Illness Pt is a 68 yo female diagnosed with degenerative arthrosis of the right knee and is s/p elective R TKA.  PMH includes: HTN, depression, GERD, DM, OSA, lumbar laminectomy/decompression 01/2018, and R ankle ORIF.      PT Comments    Pt presents with deficits in strength, transfers, mobility, gait, R knee ROM, and activity tolerance but made very good progress towards goals this session.  Pt was SBA with transfers with good control and stability with only min verbal cues for sequencing.  Pt was able to amb 2 x 100' with a RW and CGA with step-through pattern with gradually increasing RLE stance time as walking progressed.  Pt's cadence was slow with short B step length but no instability or R knee buckling noted.  Pt will benefit from HHPT services upon discharge to safely address above deficits for decreased caregiver assistance and eventual return to PLOF.      Follow Up Recommendations  Home health PT     Equipment Recommendations  3in1 (PT)    Recommendations for Other Services       Precautions / Restrictions Precautions Precautions: Fall;Knee Precaution Comments: Pt able to perform Ind RLE SLRs without extensor lag, no KI required Restrictions Weight Bearing Restrictions: Yes RLE Weight Bearing: Weight bearing as tolerated    Mobility  Bed Mobility Overal bed mobility: Modified Independent             General bed mobility comments: NT, pt in recliner  Transfers Overall transfer level: Needs assistance Equipment used: Rolling walker (2 wheeled) Transfers: Sit to/from Stand Sit to Stand: Supervision;From elevated surface         General transfer comment: Good control and stability with min verbal cues for sequencing.  Ambulation/Gait Ambulation/Gait assistance: Min guard Gait Distance (Feet): 100 Feet x  2 Assistive device: Rolling walker (2 wheeled) Gait Pattern/deviations: Step-through pattern;Decreased stance time - right;Decreased step length - left;Antalgic Gait velocity: decreased   General Gait Details: Mildly antalgic gait pattern with short B step length and slow cadence but steady without LOB; min verbal cues for upright posture and decreased UE WB on the RW   Stairs             Wheelchair Mobility    Modified Rankin (Stroke Patients Only)       Balance Overall balance assessment: No apparent balance deficits (not formally assessed)                                          Cognition Arousal/Alertness: Awake/alert Behavior During Therapy: WFL for tasks assessed/performed Overall Cognitive Status: Within Functional Limits for tasks assessed                                        Exercises Total Joint Exercises Ankle Circles/Pumps: AROM;Strengthening;Both;10 reps;15 reps Quad Sets: AROM;Strengthening;10 reps;15 reps;Both Gluteal Sets: Strengthening;Both;10 reps;15 reps Long Arc Quad: AROM;Right;10 reps;15 reps Knee Flexion: AROM;Right;10 reps;15 reps Goniometric ROM: R knee AROM: 0-91 deg Marching in Standing: AROM;Both;10 reps;Standing Other Exercises Other Exercises: HEP education review provided per handout Other Exercises: pt instructed in AE/DME for self care, adaptive self care strategies, home/routines modifications, falls prevention including  pet care considerations, compression stocking mgt, polar care mgt    General Comments        Pertinent Vitals/Pain Pain Assessment: 0-10 Pain Score: 1  Pain Location: R knee Pain Descriptors / Indicators: Sore Pain Intervention(s): Premedicated before session;Monitored during session    Home Living Family/patient expects to be discharged to:: Private residence Living Arrangements: Children;Other relatives(grandchildren, dog) Available Help at Discharge: Available  PRN/intermittently;Available 24 hours/day;Family(Temporary 24 hr assistance upon discharge from acute care) Type of Home: House Home Access: Ramped entrance   Home Layout: One level Home Equipment: Walker - 2 wheels;Cane - single point;Adaptive equipment      Prior Function Level of Independence: Independent      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[employer], 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, 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, Ind with ADLs   PT Goals (current goals can now be found in the care plan section) Acute Rehab PT Goals Patient Stated Goal: to go home and recover Progress towards PT goals: Progressing toward goals    Frequency    BID      PT Plan Current plan remains appropriate    Co-evaluation              AM-PAC PT "6 Clicks" Mobility   Outcome Measure  Help needed turning from your back to your side while in a flat bed without using bedrails?: A Little Help needed moving from lying on your back to sitting on the side of a flat bed without using bedrails?: A Little Help needed moving to and from a bed to a chair (including a wheelchair)?: A Little Help needed standing up from a chair using your arms (e.g., wheelchair or bedside chair)?: A Little Help needed to walk in hospital room?: A Little Help needed climbing 3-5 steps with a railing? : A Little 6 Click Score: 18    End of Session Equipment Utilized During Treatment: Gait belt Activity Tolerance: Patient tolerated treatment well;No increased pain Patient left: in chair;with chair alarm set;with call bell/phone within reach;with SCD's reapplied;Other (comment);with family/visitor present(Polar care to R knee) Nurse Communication: Mobility status PT Visit Diagnosis: Muscle weakness (generalized) (M62.81);Other abnormalities of gait and mobility (R26.89);Pain Pain - Right/Left: Right Pain - part of body: Knee     Time: 0962-8366 PT Time Calculation (min) (ACUTE ONLY): 29 min  Charges:  $Gait Training: 8-22 mins $Therapeutic  Exercise: 8-22 mins                     D. Scott Baelyn Doring PT, DPT 03/04/19, 9:22 AM

## 2019-03-04 NOTE — Progress Notes (Signed)
  Subjective: 1 Day Post-Op Procedure(s) (LRB): COMPUTER ASSISTED TOTAL KNEE ARTHROPLASTY-RIGHT (Right) Patient reports pain as mild.   Patient seen in rounds with Dr. Marry Guan. Patient is well, and has had no acute complaints or problems Plan is to go Home after hospital stay. Negative for chest pain and shortness of breath Fever: no Gastrointestinal: Negative for nausea and vomiting  Objective: Vital signs in last 24 hours: Temp:  [96.8 F (36 C)-99.2 F (37.3 C)] 97.6 F (36.4 C) (03/05 0449) Pulse Rate:  [60-86] 65 (03/05 0449) Resp:  [8-23] 17 (03/05 0449) BP: (101-143)/(45-75) 107/50 (03/05 0449) SpO2:  [86 %-97 %] 93 % (03/05 0449)  Intake/Output from previous day:  Intake/Output Summary (Last 24 hours) at 03/04/2019 0603 Last data filed at 03/03/2019 2321 Gross per 24 hour  Intake 2706.59 ml  Output 2605 ml  Net 101.59 ml    Intake/Output this shift: Total I/O In: -  Out: 1275 [Urine:1275]  Labs: No results for input(s): HGB in the last 72 hours. No results for input(s): WBC, RBC, HCT, PLT in the last 72 hours. No results for input(s): NA, K, CL, CO2, BUN, CREATININE, GLUCOSE, CALCIUM in the last 72 hours. No results for input(s): LABPT, INR in the last 72 hours.   EXAM General - Patient is Alert and Oriented Extremity - Neurovascular intact Sensation intact distally Dorsiflexion/Plantar flexion intact Compartment soft Dressing/Incision - clean, dry, with the Hemovac intact Motor Function - intact, moving foot and toes well on exam.  Able to do a straight leg raise independently  Past Medical History:  Diagnosis Date  . Allergy    hay fever  . Ankle fracture, left 04/08/2014  . Arthritis   . Asthma   . Blood transfusion abn reaction or complication, no procedure mishap   . Depression   . Diabetes mellitus   . Disc displacement, lumbar   . Fatty liver   . GERD (gastroesophageal reflux disease)   . Headache(784.0)   . Hyperlipidemia   . Hypertension    . Migraine   . Obstructive sleep apnea    CPAP  . PONV (postoperative nausea and vomiting)   . Sleep apnea 04/06/2017  . UTI (lower urinary tract infection)   . Wears glasses     Assessment/Plan: 1 Day Post-Op Procedure(s) (LRB): COMPUTER ASSISTED TOTAL KNEE ARTHROPLASTY-RIGHT (Right) Active Problems:   Total knee replacement status  Estimated body mass index is 34.48 kg/m as calculated from the following:   Height as of 02/18/19: 5\' 4"  (1.626 m).   Weight as of 02/18/19: 91.1 kg. Advance diet Up with therapy D/C IV fluids Discharge home with home health tomorrow  DVT Prophylaxis - Lovenox, Foot Pumps and TED hose Weight-Bearing as tolerated to right leg  Reche Dixon, PA-C Orthopaedic Surgery 03/04/2019, 6:03 AM

## 2019-03-04 NOTE — Care Management (Signed)
#  8.  S/W GABRIEL  @   [employer] # [phone]   3. LOVENOX   40 MG DAILY   14 SYRINGES COVER- NOT COVER PRIOR APPROVAL- YES # [phone]   2. ENOXAPARIN  40 MG DAILY  14 SYRINGES COVER -YES CO-PAY- $ 250.00 TIER- 5 DRUG PRIOR APPROVAL- NO  NO DEDUCTIBLE OUT-OF-POCKET: NOT MET  PREFERRED PHARMACY : [facility]

## 2019-03-04 NOTE — Progress Notes (Signed)
Clinical Social Worker (CSW) received SNF consult. PT is recommending home health. RN case manager aware of above. Please reconsult if future social work needs arise. CSW signing off.   Caeden Foots, LCSW [phone] 

## 2019-03-04 NOTE — Care Management (Signed)
[facility] pharmacy and the copay for the non preferred generic is $15

## 2019-03-04 NOTE — Evaluation (Signed)
Occupational Therapy Evaluation Patient Details Name: Vanessa Higgins MRN: [id number] DOB: 12/29/1951 Today's Date: 03/04/2019    History of Present Illness Pt is a 68 yo female diagnosed with degenerative arthrosis of the right knee and is s/p elective R TKA.  PMH includes: HTN, depression, GERD, DM, OSA, lumbar laminectomy/decompression 01/2018, and R ankle ORIF.     Clinical Impression   Pt seen for OT evaluation this date, POD#1 from above surgery. Pt was independent in all ADL and mobility prior to surgery and working full time as an Therapist, sports at [employer]. Pt is eager to return to PLOF with less pain and improved safety and independence. Pt currently requires minimal assist for LB dressing and bathing while in seated position due to pain and limited AROM of R knee. Pt able to perform transfers with SBA and good confidence. Pt instructed in polar care mgt, falls prevention strategies, home/routines modifications, DME/AE for LB bathing and dressing tasks, and compression stocking mgt. Pt verbalized understanding of all education/training provided. Pt has benefited maximally from OT evaluation and treatment. Do not currently anticipate any further skilled OT needs at this time. Will sign off.       Follow Up Recommendations  No OT follow up    Equipment Recommendations  3 in 1 bedside commode    Recommendations for Other Services       Precautions / Restrictions Precautions Precautions: Fall;Knee Precaution Comments: Pt able to perform Ind RLE SLRs without extensor lag, no KI required Restrictions Weight Bearing Restrictions: Yes RLE Weight Bearing: Weight bearing as tolerated      Mobility Bed Mobility Overal bed mobility: Modified Independent                Transfers Overall transfer level: Needs assistance Equipment used: Rolling walker (2 wheeled) Transfers: Sit to/from Stand Sit to Stand: Supervision;Min guard;From elevated surface         General transfer comment: elevated  bed to mimic home environment, SBA for transfer    Balance Overall balance assessment: No apparent balance deficits (not formally assessed)                                         ADL either performed or assessed with clinical judgement   ADL Overall ADL's : Needs assistance/impaired                                       General ADL Comments: Max A for compression stockings, Min A for LB ADL, Mod I for UB ADL, Sup for standing grooming tasks at sink     Vision Baseline Vision/History: Wears glasses Wears Glasses: At all times Patient Visual Report: No change from baseline       Perception     Praxis      Pertinent Vitals/Pain Pain Assessment: No/denies pain Pain Descriptors / Indicators: Sore Pain Intervention(s): Limited activity within patient's tolerance;Monitored during session;Repositioned;Ice applied     Hand Dominance Right   Extremity/Trunk Assessment Upper Extremity Assessment Upper Extremity Assessment: Overall WFL for tasks assessed   Lower Extremity Assessment Lower Extremity Assessment: RLE deficits/detail RLE Deficits / Details: expected post-op strength/ROM deficits RLE Sensation: WNL   Cervical / Trunk Assessment Cervical / Trunk Assessment: Normal   Communication Communication Communication: No difficulties   Cognition Arousal/Alertness: Awake/alert Behavior  During Therapy: WFL for tasks assessed/performed Overall Cognitive Status: Within Functional Limits for tasks assessed                                     General Comments       Exercises Other Exercises Other Exercises: pt instructed in AE/DME for self care, adaptive self care strategies, home/routines modifications, falls prevention including pet care considerations, compression stocking mgt, polar care mgt   Shoulder Instructions      Home Living Family/patient expects to be discharged to:: Private residence Living Arrangements:  Children;Other relatives(grandchildren, dog) Available Help at Discharge: Available PRN/intermittently;Available 24 hours/day;Family(Temporary 24 hr assistance upon discharge from acute care) Type of Home: House Home Access: Ramped entrance     Home Layout: One level     Bathroom Shower/Tub: Occupational psychologist: Standard     Home Equipment: Environmental consultant - 2 wheels;Cane - single point;Adaptive equipment Adaptive Equipment: Reacher        Prior Functioning/Environment Level of Independence: Independent        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[employer], 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, 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, Ind with ADLs        OT Problem List: Decreased strength;Decreased range of motion      OT Treatment/Interventions:      OT Goals(Current goals can be found in the care plan section) Acute Rehab OT Goals Patient Stated Goal: to go home and recover OT Goal Formulation: All assessment and education complete, DC therapy  OT Frequency:     Barriers to D/C:            Co-evaluation              AM-PAC OT "6 Clicks" Daily Activity     Outcome Measure Help from another person eating meals?: None Help from another person taking care of personal grooming?: None Help from another person toileting, which includes using toliet, bedpan, or urinal?: A Little Help from another person bathing (including washing, rinsing, drying)?: A Little Help from another person to put on and taking off regular upper body clothing?: None Help from another person to put on and taking off regular lower body clothing?: A Little 6 Click Score: 21   End of Session Equipment Utilized During Treatment: Rolling walker Nurse Communication: Other (comment)(foley catheter still in place)  Activity Tolerance: Patient tolerated treatment well Patient left: in chair;with call bell/phone within reach;with chair alarm set;with SCD's reapplied;Other (comment)(polar care in place, rolled towel under R ankle)  OT  Visit Diagnosis: Other abnormalities of gait and mobility (R26.89)                Time: 2334-3568 OT Time Calculation (min): 23 min Charges:  OT General Charges $OT Visit: 1 Visit OT Evaluation $OT Eval Low Complexity: 1 Low OT Treatments $Self Care/Home Management : 8-22 mins  Jeni Salles, MPH, MS, OTR/L ascom [phone] 03/04/19, 8:41 AM

## 2019-03-04 NOTE — Care Management (Signed)
Sent Secure inbox to CMA requesting Benefit check for price for Lovenox

## 2019-03-04 NOTE — Care Management Note (Signed)
Case Management Note  Patient Details  Name: KA BENCH MRN: [id number] Date of Birth: 1951/05/29  Subjective/Objective:                  Met with the patient to discuss DC plan and needs Patient has a RW but needs a 3 in 1, notified Brad at [facility] via secure Email, [facility] list provided to the patient per CMS.gov  Patient would like to use [facility] Notified Helene Kelp via [employer] Patient uses [facility] employee pharmacy PCP is Dr. Vidal Schwalbe Has transportation with daughter and a friend   Action/Plan: [facility] list provided per CMS.gov and a copy placed on the chart Chose [facility], notified teresa CMA requested to check price of Lovenox  Expected Discharge Date:                  Expected Discharge Plan:     In-House Referral:     Discharge planning Services  CM Consult  Post Acute Care Choice:  Home Health Choice offered to:  Patient  DME Arranged:  3-N-1 DME Agency:  [facility]  HH Arranged:  PT [place]:  [facility] [employer]  Status of Service:  In process, will continue to follow  If discussed at Long Length of Stay Meetings, dates discussed:    Additional Comments:  Su Hilt, RN 03/04/2019, 10:21 AM

## 2019-03-05 LAB — GLUCOSE, CAPILLARY
Glucose-Capillary: 126 mg/dL — ABNORMAL HIGH (ref 70–99)
Glucose-Capillary: 103 mg/dL — ABNORMAL HIGH (ref 70–99)

## 2019-03-05 NOTE — Progress Notes (Signed)
   Subjective: 2 Days Post-Op Procedure(s) (LRB): COMPUTER ASSISTED TOTAL KNEE ARTHROPLASTY-RIGHT (Right) Patient reports pain as mild.   Patient is well, and has had no acute complaints or problems Denies any CP, SOB, ABD pain. We will continue therapy today.  Plan is to go Home after hospital stay.  Objective: Vital signs in last 24 hours: Temp:  [97.9 F (36.6 C)-98.6 F (37 C)] 98.2 F (36.8 C) (03/05 2251) Pulse Rate:  [59-71] 71 (03/05 2251) Resp:  [16-19] 19 (03/05 2251) BP: (102-123)/(50-62) 123/56 (03/05 2251) SpO2:  [93 %-97 %] 97 % (03/05 2251)  Intake/Output from previous day: 03/05 0701 - 03/06 0700 In: 480 [P.O.:480] Out: 740 [Urine:650; Drains:90] Intake/Output this shift: No intake/output data recorded.  No results for input(s): HGB in the last 72 hours. No results for input(s): WBC, RBC, HCT, PLT in the last 72 hours. No results for input(s): NA, K, CL, CO2, BUN, CREATININE, GLUCOSE, CALCIUM in the last 72 hours. No results for input(s): LABPT, INR in the last 72 hours.  EXAM General - Patient is Alert, Appropriate and Oriented Extremity - Neurovascular intact Sensation intact distally Intact pulses distally Dorsiflexion/Plantar flexion intact No cellulitis present Compartment soft Dressing - dressing C/D/I and scant drainage Motor Function - intact, moving foot and toes well on exam.   Past Medical History:  Diagnosis Date  . Allergy    hay fever  . Ankle fracture, left 04/08/2014  . Arthritis   . Asthma   . Blood transfusion abn reaction or complication, no procedure mishap   . Depression   . Diabetes mellitus   . Disc displacement, lumbar   . Fatty liver   . GERD (gastroesophageal reflux disease)   . Headache(784.0)   . Hyperlipidemia   . Hypertension   . Migraine   . Obstructive sleep apnea    CPAP  . PONV (postoperative nausea and vomiting)   . Sleep apnea 04/06/2017  . UTI (lower urinary tract infection)   . Wears glasses      Assessment/Plan:   2 Days Post-Op Procedure(s) (LRB): COMPUTER ASSISTED TOTAL KNEE ARTHROPLASTY-RIGHT (Right) Active Problems:   Total knee replacement status  Estimated body mass index is 34.48 kg/m as calculated from the following:   Height as of 02/18/19: 5\' 4"  (1.626 m).   Weight as of 02/18/19: 91.1 kg. Advance diet Up with therapy  Patient doing well.  Good progress of physical therapy yesterday. Vital signs are stable Plan on discharging home with home health PT today.    DVT Prophylaxis - Lovenox, Foot Pumps and TED hose Weight-Bearing as tolerated to Right leg   T. Rachelle Hora, PA-C [place] 03/05/2019, 7:18 AM

## 2019-03-05 NOTE — Care Management (Signed)
Notified Brad with [facility] that the patient is discharging today and the 3 in 1 is needed that was requested

## 2019-03-05 NOTE — Discharge Summary (Signed)
Physician Discharge Summary  Patient ID: Vanessa Higgins MRN: [id number] DOB/AGE: 1951/08/12 68 y.o.  Admit date: 03/03/2019 Discharge date: 03/05/2019  Admission Diagnoses:  PRIMARY OSTEOARTHRITIS OF RIGHT KNEE   Discharge Diagnoses: Patient Active Problem List   Diagnosis Date Noted  . H/O adenomatous polyp of colon 03/03/2019  . Total knee replacement status 03/03/2019  . Depression, major, single episode, complete remission ([place]) 11/18/2018  . HNP (herniated nucleus pulposus), lumbar 02/06/2018  . Fatty liver disease, nonalcoholic 60/73/7106  . GERD (gastroesophageal reflux disease) 10/15/2017  . PVC's (premature ventricular contractions) 08/01/2017  . OSA (obstructive sleep apnea) 04/21/2017  . Vertigo 03/26/2017  . Elevated liver enzymes 12/03/2016  . Cough 06/05/2016  . Irregular heart rate 06/05/2016  . IDA (iron deficiency anemia) 10/20/2014  . Routine general medical examination at a health care facility 05/03/2013  . Diabetes mellitus type 2, controlled ([place]) 07/09/2012  . Hypertension 07/09/2012  . Hyperlipidemia 07/09/2012  . Asthma 07/09/2012  . Obesity 07/09/2012    Past Medical History:  Diagnosis Date  . Allergy    hay fever  . Ankle fracture, left 04/08/2014  . Arthritis   . Asthma   . Blood transfusion abn reaction or complication, no procedure mishap   . Depression   . Diabetes mellitus   . Disc displacement, lumbar   . Fatty liver   . GERD (gastroesophageal reflux disease)   . Headache(784.0)   . Hyperlipidemia   . Hypertension   . Migraine   . Obstructive sleep apnea    CPAP  . PONV (postoperative nausea and vomiting)   . Sleep apnea 04/06/2017  . UTI (lower urinary tract infection)   . Wears glasses      Transfusion: none   Consultants (if any):   Discharged Condition: Improved  Hospital Course: Vanessa Higgins is an 68 y.o. female who was admitted 03/03/2019 with a diagnosis of right knee osteoarthritis and went to the operating room on  03/03/2019 and underwent the above named procedures.    Surgeries: Procedure(s): COMPUTER ASSISTED TOTAL KNEE ARTHROPLASTY-RIGHT on 03/03/2019 Patient tolerated the surgery well. Taken to PACU where she was stabilized and then transferred to the orthopedic floor.  Started on Lovenox 30 mg  q 12 hrs. Foot pumps applied bilaterally at 80 mm. Heels elevated on bed with rolled towels. No evidence of DVT. Negative [place]. Physical therapy started on day #1 for gait training and transfer. OT started day #1 for ADL and assisted devices.  Patient's foley was d/c on day #1. Patient's IV and hemovac was d/c on day #2.  On post op day #2 patient was stable and ready for discharge to home with HHPT.  Implants: DePuy PFC Sigma size 3 posterior stabilized femoral component (cemented), size 3 MBT tibial component (cemented), 35 mm 3 peg oval dome patella (cemented), and a 10 mm stabilized rotating platform polyethylene insert.  She was given perioperative antibiotics:  Anti-infectives (From admission, onward)   Start     Dose/Rate Route Frequency Ordered Stop   03/03/19 1400  ceFAZolin (ANCEF) IVPB 2g/100 mL premix     2 g 200 mL/hr over 30 Minutes Intravenous Every 6 hours 03/03/19 1348 03/04/19 1039   03/03/19 0813  gentamicin (GARAMYCIN) 80 mg in sodium chloride 0.9 % 500 mL irrigation  Status:  Discontinued       As needed 03/03/19 0813 03/03/19 1047   03/03/19 0600  ceFAZolin (ANCEF) IVPB 2g/100 mL premix     2 g 200 mL/hr over 30  Minutes Intravenous On call to O.R. 03/02/19 2238 03/03/19 0751   03/03/19 0558  ceFAZolin (ANCEF) 2-4 GM/100ML-% IVPB    Note to Pharmacy:  Milinda Cave   : cabinet override      03/03/19 0558 03/03/19 0741    .  She was given sequential compression devices, early ambulation, and Lovenox, teds for DVT prophylaxis.  She benefited maximally from the hospital stay and there were no complications.    Recent vital signs:  Vitals:   03/04/19 1652 03/04/19 2251  BP:  (!) 116/50 (!) 123/56  Pulse: 67 71  Resp:  19  Temp: 98.6 F (37 C) 98.2 F (36.8 C)  SpO2: 96% 97%    Recent laboratory studies:  Lab Results  Component Value Date   HGB 10.5 (L) 02/18/2019   HGB 12.8 02/06/2018   HGB 12.6 03/22/2017   Lab Results  Component Value Date   WBC 6.7 02/18/2019   PLT 365 02/18/2019   Lab Results  Component Value Date   INR 1.07 02/18/2019   Lab Results  Component Value Date   NA 138 02/18/2019   K 3.6 02/18/2019   CL 102 02/18/2019   CO2 26 02/18/2019   BUN 16 02/18/2019   CREATININE 0.57 02/18/2019   GLUCOSE 96 02/18/2019    Discharge Medications:   Allergies as of 03/05/2019      Reactions   Sulfa Antibiotics Hives, Shortness Of Breath, Rash   Ramipril Cough      Medication List    STOP taking these medications   ibuprofen 800 MG tablet Commonly known as:  ADVIL,MOTRIN     TAKE these medications   albuterol 108 (90 Base) MCG/ACT inhaler Commonly known as:  PROVENTIL HFA;VENTOLIN HFA Inhale 2 puffs into the lungs every 6 (six) hours as needed for wheezing or shortness of breath.   aspirin 81 MG tablet Take 81 mg by mouth daily.   atorvastatin 10 MG tablet Commonly known as:  LIPITOR Take 1 tablet (10 mg total) by mouth at bedtime.   b complex vitamins tablet Take 1 tablet by mouth daily.   celecoxib 200 MG capsule Commonly known as:  CELEBREX Take 1 capsule (200 mg total) by mouth 2 (two) times daily.   CENTRUM SILVER PO Take 1 tablet by mouth daily.   cholecalciferol 25 MCG (1000 UT) tablet Commonly known as:  VITAMIN D3 Take 1,000 Units by mouth daily.   empagliflozin 25 MG Tabs tablet Commonly known as:  JARDIANCE Take 25 mg by mouth daily.   enoxaparin 40 MG/0.4ML injection Commonly known as:  LOVENOX Inject 0.4 mLs (40 mg total) into the skin daily.   Fluticasone-Salmeterol 250-50 MCG/DOSE Aepb Commonly known as:  Advair Diskus INHALE 1 PUFF INTO THE LUNGS 2 TIMES DAILY. What changed:    how  much to take  how to take this  when to take this   glucose blood test strip Commonly known as:  FREESTYLE LITE Use as instructed to check sugar up to 3 times daily   insulin degludec 100 UNIT/ML Sopn FlexTouch Pen Commonly known as:  Tyler Aas FlexTouch Inject 0.1 mLs (10 Units total) into the skin daily. Inject 10 units daily. Titrate as instructed. Max dose 30 units. What changed:    how much to take  additional instructions   Insulin Pen Needle 32G X 4 MM Misc Commonly known as:  BD Pen Needle Nano U/F USE ONCE A DAY AS DIRECTED WITH DIABETES MEDICATION PENS   liraglutide 18 MG/3ML  Sopn Commonly known as:  Victoza INJECT 1.8 MG UNDER THE SKIN AT BEDTIME What changed:    how much to take  how to take this  when to take this   loratadine 10 MG tablet Commonly known as:  CLARITIN Take 10 mg by mouth daily.   losartan 25 MG tablet Commonly known as:  COZAAR Take 1 tablet (25 mg total) by mouth daily.   meclizine 25 MG tablet Commonly known as:  ANTIVERT Take 1 tablet (25 mg total) by mouth 3 (three) times daily as needed for dizziness.   metFORMIN 1000 MG tablet Commonly known as:  GLUCOPHAGE Take 1 tablet (1,000 mg total) by mouth 2 (two) times daily with a meal.   montelukast 10 MG tablet Commonly known as:  SINGULAIR Take 1 tablet (10 mg total) by mouth at bedtime.   omeprazole 20 MG capsule Commonly known as:  PRILOSEC TAKE 1 CAPSULE (20 MG TOTAL) BY MOUTH DAILY.   oxyCODONE 5 MG immediate release tablet Commonly known as:  Oxy IR/ROXICODONE Take 1 tablet (5 mg total) by mouth every 4 (four) hours as needed for moderate pain (pain score 4-6).   traMADol 50 MG tablet Commonly known as:  ULTRAM Take 1-2 tablets (50-100 mg total) by mouth every 4 (four) hours as needed for moderate pain.   venlafaxine XR 150 MG 24 hr capsule Commonly known as:  EFFEXOR-XR TAKE 1 CAPSULE BY MOUTH DAILY WITH BREAKFAST. What changed:    how much to take  how to  take this  when to take this   Verapamil HCl CR 300 MG Cp24 TAKE 1 [place]  (From admission, onward)         Start     Ordered   03/03/19 1348  DME Walker rolling  Once    Question:  Patient needs a walker to treat with the following condition  Answer:  Total knee replacement status   03/03/19 1348   03/03/19 1348  DME Bedside commode  Once    Question:  Patient needs a bedside commode to treat with the following condition  Answer:  Total knee replacement status   03/03/19 1348          Diagnostic Studies: Dg Knee Right Port  Result Date: 03/03/2019 CLINICAL DATA:  RIGHT total knee arthroplasty EXAM: PORTABLE RIGHT KNEE - 1-2 VIEW COMPARISON:  None. FINDINGS: RIGHT total knee arthroplasty changes identified. No acute fracture or dislocation. Expected postoperative changes are present. No definite complicating features noted. IMPRESSION: RIGHT total knee arthroplasty without complicating features identified. Electronically Signed   By: Margarette Canada M.D.   On: 03/03/2019 11:30    Disposition:     Follow-up Information    Watt Climes, PA On 03/17/2019.   Specialty:  Physician Assistant Why:  at 1:15pm Contact information: [place] [address] [phone]        Dereck Leep, MD On 04/15/2019.   Specialty:  Orthopedic Surgery Why:  at 1:30pm Contact information: [place] [address] [phone]            Signed: Feliberto Gottron 03/05/2019, 7:21 AM

## 2019-03-05 NOTE — Progress Notes (Signed)
Patient d/c'd home with daughter. Discharge education completed, patient reports no questions. Dressing changed on knee. Wilnette Kales

## 2019-03-05 NOTE — Progress Notes (Signed)
Physical Therapy Treatment Patient Details Name: Vanessa Higgins MRN: [id number] DOB: [birth year] Today's Date: 03/05/2019    History of Present Illness Pt is a 68 yo female diagnosed with degenerative arthrosis of the right knee and is s/p elective R TKA.  PMH includes: HTN, depression, GERD, DM, OSA, lumbar laminectomy/decompression 01/2018, and R ankle ORIF.      PT Comments    Pt presents with deficits in strength, transfers, mobility, gait, R knee ROM, and activity tolerance but is making very good progress with goals.  Pt was Mod Ind with bed mobility tasks SBA with transfers with good speed and control.  Pt was able to amb 200' with a RW with SBA with good stability and showed good eccentric and concentric control ascending/descending 1 step with a RW and 4 steps with a RW.  Pt will benefit from HHPT services upon discharge to safely address above deficits for decreased caregiver assistance and eventual return to PLOF.     Follow Up Recommendations  Home health PT     Equipment Recommendations  3in1 (PT)    Recommendations for Other Services       Precautions / Restrictions Precautions Precautions: Fall;Knee Precaution Comments: Pt able to perform Ind RLE SLRs without extensor lag, no KI required Restrictions Weight Bearing Restrictions: Yes RLE Weight Bearing: Weight bearing as tolerated    Mobility  Bed Mobility Overal bed mobility: Modified Independent             General bed mobility comments: Min extra effort but no physical assistance  Transfers Overall transfer level: Needs assistance Equipment used: Rolling walker (2 wheeled) Transfers: Sit to/from Stand Sit to Stand: Supervision;From elevated surface         General transfer comment: Good control and stability with min verbal cues for sequencing.  Ambulation/Gait Ambulation/Gait assistance: Supervision Gait Distance (Feet): 200 Feet Assistive device: Rolling walker (2 wheeled) Gait  Pattern/deviations: Step-through pattern;Decreased step length - right;Decreased step length - left Gait velocity: decreased   General Gait Details: Mildly antalgic gait pattern with short B step length and slow cadence but steady without LOB   Stairs Stairs: Yes Stairs assistance: Min guard Stair Management: With walker;Two rails Number of Stairs: 4 General stair comments: Ascend/descend 1 step with a RW and 4 steps with B rails with CGA and min verbal cues for proper sequencing   Wheelchair Mobility    Modified Rankin (Stroke Patients Only)       Balance Overall balance assessment: No apparent balance deficits (not formally assessed)                                          Cognition Arousal/Alertness: Awake/alert Behavior During Therapy: WFL for tasks assessed/performed Overall Cognitive Status: Within Functional Limits for tasks assessed                                        Exercises Total Joint Exercises Ankle Circles/Pumps: AROM;Strengthening;Both;10 reps;15 reps Quad Sets: AROM;Strengthening;10 reps;15 reps;Both Gluteal Sets: Strengthening;Both;10 reps;15 reps Hip ABduction/ADduction: AROM;Right;10 reps Straight Leg Raises: AROM;Right;10 reps Long Arc Quad: AROM;Right;10 reps;15 reps Knee Flexion: AROM;Right;10 reps;15 reps Goniometric ROM: R knee AROM: 0-98 deg Marching in Standing: AROM;Both;10 reps;Standing Other Exercises Other Exercises: HEP education review provided per handout    General Comments  Pertinent Vitals/Pain Pain Assessment: 0-10 Pain Score: 1  Pain Location: R knee Pain Descriptors / Indicators: Sore Pain Intervention(s): Monitored during session    Home Living                      Prior Function            PT Goals (current goals can now be found in the care plan section) Progress towards PT goals: Progressing toward goals    Frequency    BID      PT Plan Current  plan remains appropriate    Co-evaluation              AM-PAC PT "6 Clicks" Mobility   Outcome Measure  Help needed turning from your back to your side while in a flat bed without using bedrails?: A Little Help needed moving from lying on your back to sitting on the side of a flat bed without using bedrails?: A Little Help needed moving to and from a bed to a chair (including a wheelchair)?: A Little Help needed standing up from a chair using your arms (e.g., wheelchair or bedside chair)?: A Little Help needed to walk in hospital room?: A Little Help needed climbing 3-5 steps with a railing? : A Little 6 Click Score: 18    End of Session Equipment Utilized During Treatment: Gait belt Activity Tolerance: Patient tolerated treatment well;No increased pain Patient left: with call bell/phone within reach;in chair;with chair alarm set;with SCD's reapplied;Other (comment)(Polar care to R knee) Nurse Communication: Mobility status PT Visit Diagnosis: Muscle weakness (generalized) (M62.81);Other abnormalities of gait and mobility (R26.89);Pain Pain - Right/Left: Right Pain - part of body: Knee     Time: 0910-0949 PT Time Calculation (min) (ACUTE ONLY): 39 min  Charges:  $Gait Training: 23-37 mins $Therapeutic Exercise: 8-22 mins                     D. Scott Genelda Roark PT, DPT 03/05/19, 11:51 AM

## 2019-03-08 ENCOUNTER — Other Ambulatory Visit: Payer: Self-pay | Admitting: *Deleted

## 2019-03-08 NOTE — Care Management (Signed)
Called and spoke to [place] at [facility] requesting to see if they would accept the patient, sent a secure email with patient information

## 2019-03-08 NOTE — Care Management (Signed)
Called [place] with [facility] and she said that they are out of network with [facility]

## 2019-03-08 NOTE — Care Management (Signed)
Called [employer] to get benefit information to see what [facility] agency is [place] is the only [facility] agencies INN

## 2019-03-08 NOTE — Care Management (Signed)
Contacted the patient and let her know that I called the [employer] and they gave me the 2 companies INN, [facility] can come out to see the patient tomorrow and they are INN, Patient agrees

## 2019-03-08 NOTE — Care Management (Signed)
Contacted Dr. Clydell Hakim office ans requested to speak to [facility].  Let herknow that [facility] will see the patient tomorrow and that they are INN

## 2019-03-08 NOTE — Care Management (Signed)
Received call from Margaretha Sheffield at Dr. Clydell Hakim office from [phone], she said tehat [facility] did not go see the patient and she called to ask [facility] why, She stated that they said that the patient is out of network.  I let Margaretha Sheffield know that when I sent the information to [facility] on Thursday that they did not let me know that the patient was out of network,  She stated that [facility] told her they just found out today. I agreed to call the patient and see if I can find another agency to see her and will call Margaretha Sheffield back and let her know

## 2019-03-08 NOTE — Care Management (Signed)
Corene Cornea with [facility] notified me that they could see the patient tomorrow

## 2019-03-08 NOTE — Patient Outreach (Signed)
Vanessa Higgins) Care Higgins  03/08/2019  Vanessa Higgins Oct 15, 1951 [id number]   Transition of care call   Referral received: 02/26/19 Initial outreach:03/02/19 for pre-op call Insurance: [employer] Higgins   Subjective: Initial successful telephone call to patient's home number in order to complete transition of care assessment; 2 HIPAA identifiers verified. Explained purpose of call and completed transition of care assessment.  Vanessa Higgins states she is doing well, denies post op problems, states surgical pain well managed with prescribed medications, tolerating  diet , denies bowel or bladder problems, surgical incision unremarkable. She says she is ambulating with our difficulty using her rolling walker. She says her daughter is assisting  with her recovery.  She says her fasting blood sugars since Higgins discharge have been less than 130 except for this morning the reading was 13. She says all  post large meal readings have been less than 180, averaging 150.  She says home health physical therapy has not started because the original company contacted is not in network with her Focus Plan. She has called her surgeon's office and let them know that an order needs to be called to an in network provider.  [name] says she is active with [place] condition Higgins program via Vanessa Higgins.    Objective:  Vanessa Higgins knee arthoplastyon 3/4/20at Vanessa Higgins.  She was discharged to home on 03/05/19 with orders for home health services of physical therapy and DME of a 3 in 1 and rolling walker. Comorbidities include:Type 2 DM, arthritis, migraine headaches, obstructive sleep apnea, depression, fatty liver, HTN, Hyperlipidemia, asthma, and obesity  Assessment:  Patient voices good understanding of all discharge instructions.  See transition of care flowsheet for assessment details.   Plan:  Reviewed [facility] 2020 Wellness Requirements of: Completing the computerized Health Assessment and the Health Action Step with [facility] Vanessa Higgins) by August 31 2019 AND have an annual physical between December 30, 2017 and June 30, 2019.  No ongoing care Higgins needs identified so will close case to Vanessa Higgins care Higgins services and route successful outreach letter with Vanessa Higgins pamphlet and 24 Hour Nurse Line Magnet to Vanessa Higgins clinical pool to be mailed to patient's home address.   Barrington Ellison RN,CCM,CDE [place] [facility] Coordinator Office Phone [phone] Office Fax [phone]

## 2019-03-15 ENCOUNTER — Other Ambulatory Visit: Payer: Self-pay | Admitting: Family

## 2019-03-15 DIAGNOSIS — I10 Essential (primary) hypertension: Secondary | ICD-10-CM

## 2019-04-07 ENCOUNTER — Other Ambulatory Visit: Payer: Self-pay | Admitting: Family

## 2019-04-22 ENCOUNTER — Telehealth: Payer: Self-pay | Admitting: *Deleted

## 2019-04-22 NOTE — Telephone Encounter (Signed)
Virtual Visit Pre-Appointment Phone Call  "(Name), I am calling you today to discuss your upcoming appointment. We are currently trying to limit exposure to the virus that causes COVID-19 by seeing patients at home rather than in the office."  1. "What is the BEST phone number to call the day of the visit?" - include this in appointment notes  2. "Do you have or have access to (through a family member/friend) a smartphone with video capability that we can use for your visit?" a. If yes - list this number in appt notes as "cell" (if different from BEST phone #) and list the appointment type as a VIDEO visit in appointment notes b. If no - list the appointment type as a PHONE visit in appointment notes  3. Confirm consent - "In the setting of the current Covid19 crisis, you are scheduled for a (VIDEO) visit with your provider on (04/30/2019) at (9:00 AM).  Just as we do with many in-office visits, in order for you to participate in this visit, we must obtain consent.  If you'd like, I can send this to your mychart (if signed up) or email for you to review.  Otherwise, I can obtain your verbal consent now.  All virtual visits are billed to your insurance company just like a normal visit would be.  By agreeing to a virtual visit, we'd like you to understand that the technology does not allow for your provider to perform an examination, and thus may limit your provider's ability to fully assess your condition. If your provider identifies any concerns that need to be evaluated in person, we will make arrangements to do so.  Finally, though the technology is pretty good, we cannot assure that it will always work on either your or our end, and in the setting of a video visit, we may have to convert it to a phone-only visit.  In either situation, we cannot ensure that we have a secure connection.  Are you willing to proceed?" YES  4. Advise patient to be prepared - "Two hours prior to your appointment, go  ahead and check your blood pressure, pulse, oxygen saturation, and your weight (if you have the equipment to check those) and write them all down. When your visit starts, your provider will ask you for this information. If you have an Apple Watch or Kardia device, please plan to have heart rate information ready on the day of your appointment. Please have a pen and paper handy nearby the day of the visit as well."  5. Give patient instructions for MyChart download to smartphone OR Doximity/Doxy.me as below if video visit (depending on what platform provider is using)  6. Inform patient they will receive a phone call 15 minutes prior to their appointment time (may be from unknown caller ID) so they should be prepared to answer    TELEPHONE CALL NOTE  Vanessa Higgins has been deemed a candidate for a follow-up tele-health visit to limit community exposure during the Covid-19 pandemic. I spoke with the patient via phone to ensure availability of phone/video source, confirm preferred email & phone number, and discuss instructions and expectations.  I reminded Vanessa Higgins to be prepared with any vital sign and/or heart rhythm information that could potentially be obtained via home monitoring, at the time of her visit. I reminded Vanessa Higgins to expect a phone call prior to her visit.  Britt Bottom, CMA 04/22/2019 4:31 PM   INSTRUCTIONS FOR  DOWNLOADING THE MYCHART APP TO SMARTPHONE  - The patient must first make sure to have activated MyChart and know their login information - If Apple, go to [employer] and type in MyChart in the search bar and download the app. If Android, ask patient to go to [employer] and type in [place] in the search bar and download the app. The app is free but as with any other app downloads, their phone may require them to verify saved payment information or Apple/Android password.  - The patient will need to then log into the app with their MyChart username and  password, and select [facility] as their healthcare provider to link the account. When it is time for your visit, go to the MyChart app, find appointments, and click Begin Video Visit. Be sure to Select Allow for your device to access the Microphone and Camera for your visit. You will then be connected, and your provider will be with you shortly.  **If they have any issues connecting, or need assistance please contact MyChart service desk [phone])**  **If using a computer, in order to ensure the best quality for their visit they will need to use either of the following Internet Browsers: [employer], or Google Chrome**  IF USING DOXIMITY or DOXY.ME - The patient will receive a link just prior to their visit by text.     FULL LENGTH CONSENT FOR TELE-HEALTH VISIT   I hereby voluntarily request, consent and authorize [facility] and its employed or contracted physicians, physician assistants, nurse practitioners or other licensed health care professionals (the Practitioner), to provide me with telemedicine health care services (the "Services") as deemed necessary by the treating Practitioner. I acknowledge and consent to receive the Services by the Practitioner via telemedicine. I understand that the telemedicine visit will involve communicating with the Practitioner through live audiovisual communication technology and the disclosure of certain medical information by electronic transmission. I acknowledge that I have been given the opportunity to request an in-person assessment or other available alternative prior to the telemedicine visit and am voluntarily participating in the telemedicine visit.  I understand that I have the right to withhold or withdraw my consent to the use of telemedicine in the course of my care at any time, without affecting my right to future care or treatment, and that the Practitioner or I may terminate the telemedicine visit at any time. I  understand that I have the right to inspect all information obtained and/or recorded in the course of the telemedicine visit and may receive copies of available information for a reasonable fee.  I understand that some of the potential risks of receiving the Services via telemedicine include:  Marland Kitchen Delay or interruption in medical evaluation due to technological equipment failure or disruption; . Information transmitted may not be sufficient (e.g. poor resolution of images) to allow for appropriate medical decision making by the Practitioner; and/or  . In rare instances, security protocols could fail, causing a breach of personal health information.  Furthermore, I acknowledge that it is my responsibility to provide information about my medical history, conditions and care that is complete and accurate to the best of my ability. I acknowledge that Practitioner's advice, recommendations, and/or decision may be based on factors not within their control, such as incomplete or inaccurate data provided by me or distortions of diagnostic images or specimens that may result from electronic transmissions. I understand that the practice of medicine is not an exact science and that  Practitioner makes no warranties or guarantees regarding treatment outcomes. I acknowledge that I will receive a copy of this consent concurrently upon execution via email to the email address I last provided but may also request a printed copy by calling the office of [place].    I understand that my insurance will be billed for this visit.   I have read or had this consent read to me. . I understand the contents of this consent, which adequately explains the benefits and risks of the Services being provided via telemedicine.  . I have been provided ample opportunity to ask questions regarding this consent and the Services and have had my questions answered to my satisfaction. . I give my informed consent for the services to be  provided through the use of telemedicine in my medical care  By participating in this telemedicine visit I agree to the above.

## 2019-04-27 NOTE — Progress Notes (Signed)
Virtual Visit via Video Note   This visit type was conducted due to national recommendations for restrictions regarding the COVID-19 Pandemic (e.g. social distancing) in an effort to limit this patient's exposure and mitigate transmission in our community.  Due to her co-morbid illnesses, this patient is at least at moderate risk for complications without adequate follow up.  This format is felt to be most appropriate for this patient at this time.  All issues noted in this document were discussed and addressed.  A limited physical exam was performed with this format.  Please refer to the patient's chart for her consent to telehealth for [facility].   Evaluation Performed:  Follow-up visit  Date:  04/30/2019   ID:  Vanessa Higgins, Vanessa Higgins 02-15-1951, MRN [id number]  Patient Location: Home Provider Location: Office  PCP:  Burnard Hawthorne, FNP  Cardiologist:  [name], MD  Electrophysiologist:  None   Chief Complaint: Follow-up palpitations and PVCs  History of Present Illness:    Vanessa Higgins is a 68 y.o. female with history of frequent PVCs, hypertension, hyperlipidemia, type 2 diabetes mellitus asthma, obstructive sleep apnea, and GERD.  We are speaking today for follow-up of her PVCs.  I last saw her in 07/2017, at which time she was doing well.  No medication changes or further testing were pursued at that time.  Today, [name] reports that she has continued to feel well.  She is recovering from a right knee replacement in early March.  She hopes to go back to work next week.  She has rare palpitations without associated symptoms.  She has not had chest pain, shortness of breath, lightheadedness, or edema.  Her weight is down a few pounds over the last 6 months.  Blood pressure has been well controlled at home, similar to today's reading.  Blood sugars have also been under good control, particularly since leaving the hospital last month.  The patient does not have symptoms  concerning for COVID-19 infection (fever, chills, cough, or new shortness of breath).    Past Medical History:  Diagnosis Date  . Allergy    hay fever  . Ankle fracture, left 04/08/2014  . Arthritis   . Asthma   . Blood transfusion abn reaction or complication, no procedure mishap   . Depression   . Diabetes mellitus   . Disc displacement, lumbar   . Fatty liver   . GERD (gastroesophageal reflux disease)   . Headache(784.0)   . Hyperlipidemia   . Hypertension   . Migraine   . Obstructive sleep apnea    CPAP  . PONV (postoperative nausea and vomiting)   . Sleep apnea 04/06/2017  . UTI (lower urinary tract infection)   . Wears glasses    Past Surgical History:  Procedure Laterality Date  . APPENDECTOMY  1977  . COLONOSCOPY    . KNEE ARTHROPLASTY Right 03/03/2019   Procedure: COMPUTER ASSISTED TOTAL KNEE ARTHROPLASTY-RIGHT;  Surgeon: Dereck Leep, MD;  Location: [facility];  Service: Orthopedics;  Laterality: Right;  . LUMBAR LAMINECTOMY/DECOMPRESSION MICRODISCECTOMY Right 02/06/2018   Procedure: Lumbar two Hemilaminectomy for Discectomy;  Surgeon: Ashok Pall, MD;  Location: [place];  Service: Neurosurgery;  Laterality: Right;  . ORIF ANKLE FRACTURE Left 04/08/2014   Procedure: OPEN REDUCTION INTERNAL FIXATION (ORIF) ANKLE FRACTURE LEFT ANKLE FRACTURE OPEN TREATMENT BIMALLEOLAR ANKLE INCLUDES INTERNAL FIXATION;  Surgeon: Johnny Bridge, MD;  Location: [place];  Service: Orthopedics;  Laterality: Left;  . PAROTIDECTOMY  1980   left  . TONSILLECTOMY AND ADENOIDECTOMY     as a child  . 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 TUBAL LIGATION  05/1976     Current Meds  Medication Sig  . albuterol (PROVENTIL HFA;VENTOLIN HFA) 108 (90 Base) MCG/ACT inhaler Inhale 2 puffs into the lungs every 6 (six) hours as needed for wheezing or shortness of breath.  Marland Kitchen aspirin 81 MG tablet Take 81 mg by mouth daily.  Marland Kitchen atorvastatin (LIPITOR) 10 MG tablet Take 1 tablet (10 mg  total) by mouth at bedtime.  Marland Kitchen b complex vitamins tablet Take 1 tablet by mouth daily.  . celecoxib (CELEBREX) 200 MG capsule Take 1 capsule (200 mg total) by mouth 2 (two) times daily.  . cholecalciferol (VITAMIN D3) 25 MCG (1000 UT) tablet Take 1,000 Units by mouth daily.  . empagliflozin (JARDIANCE) 25 MG TABS tablet Take 25 mg by mouth daily.  . Fluticasone-Salmeterol (ADVAIR DISKUS) 250-50 MCG/DOSE AEPB INHALE 1 PUFF INTO THE LUNGS 2 TIMES DAILY.  Marland Kitchen glucose blood (FREESTYLE LITE) test strip Use as instructed to check sugar up to 3 times daily  . insulin degludec (TRESIBA FLEXTOUCH) 100 UNIT/ML SOPN FlexTouch Pen Inject 0.1 mLs (10 Units total) into the skin daily. Inject 10 units daily. Titrate as instructed. Max dose 30 units. (Patient taking differently: Inject 20 Units into the skin daily. )  . Insulin Pen Needle (BD PEN NEEDLE NANO U/F) 32G X 4 MM MISC USE ONCE A DAY AS DIRECTED WITH DIABETES MEDICATION PENS  . liraglutide (VICTOZA) 18 MG/3ML SOPN INJECT 1.8 MG UNDER THE SKIN AT BEDTIME (Patient taking differently: Inject 1.8 mg into the skin at bedtime. INJECT 1.8 MG UNDER THE SKIN AT BEDTIME)  . loratadine (CLARITIN) 10 MG tablet Take 10 mg by mouth daily.  Marland Kitchen losartan (COZAAR) 25 MG tablet TAKE 1 TABLET (25 MG TOTAL) BY MOUTH DAILY.  . meclizine (ANTIVERT) 25 MG tablet Take 1 tablet (25 mg total) by mouth 3 (three) times daily as needed for dizziness.  . metFORMIN (GLUCOPHAGE) 1000 MG tablet Take 1 tablet (1,000 mg total) by mouth 2 (two) times daily with a meal.  . montelukast (SINGULAIR) 10 MG tablet Take 1 tablet (10 mg total) by mouth at bedtime.  . Multiple Vitamins-Minerals (CENTRUM SILVER PO) Take 1 tablet by mouth daily.   Marland Kitchen omeprazole (PRILOSEC) 20 MG capsule TAKE 1 CAPSULE (20 MG TOTAL) BY MOUTH DAILY.  . traMADol (ULTRAM) 50 MG tablet Take 1-2 tablets (50-100 mg total) by mouth every 4 (four) hours as needed for moderate pain.  Marland Kitchen venlafaxine XR (EFFEXOR-XR) 150 MG 24 hr  capsule TAKE 1 CAPSULE BY MOUTH DAILY WITH BREAKFAST. (Patient taking differently: Take 150 mg by mouth daily with breakfast. TAKE 1 CAPSULE BY MOUTH DAILY WITH BREAKFAST.)  . Verapamil HCl CR 300 MG CP24 TAKE 1 CAPSULE BY MOUTH DAILY (Patient taking differently: Take 300 mg by mouth daily. )     Allergies:   Sulfa antibiotics and Ramipril   Social History   Tobacco Use  . Smoking status: Never Smoker  . Smokeless tobacco: Never Used  Substance Use Topics  . Alcohol use: No  . Drug use: No     Family Hx: The patient's family history includes Cancer in her brother and sister; Cancer (age of onset: 24) in her mother; Heart disease in her maternal grandfather; Heart disease (age of onset: 10) in her father; Stroke in her maternal grandmother; Sudden death in her  father and mother. There is no history of Breast cancer.  ROS:   Please see the history of present illness.   All other systems reviewed and are negative.   Prior CV studies:   The following studies were reviewed today:  EP Procedures and Devices:  Holter monitor (09/19/16): Predominantly sinus rhythm with rare PACs and frequent PVCs (11% burden). Single 5 beat atrial run.  Non-Invasive Evaluation(s):  TTE (09/19/16): Normal LV size with an EF of 60-65%. Normal wall motion and diastolic function. Normal RV size and function. No significant valvular abnormalities.  Pharmacologic MPI (09/06/16): Low risk study without ischemia. LVEF 55%.  Labs/Other Tests and Data Reviewed:    EKG:  An ECG dated 02/18/2019 was personally reviewed today and demonstrated:  Normal sinus rhythm with poor R wave progression in V1 and V2.  No PVC noted.  Recent Labs: 02/18/2019: ALT 17; BUN 16; Creatinine, Ser 0.57; Hemoglobin 10.5; Platelets 365; Potassium 3.6; Sodium 138   Recent Lipid Panel Lab Results  Component Value Date/Time   CHOL 134 06/17/2018 09:06 AM   CHOL 102 06/28/2013 07:11 AM   TRIG 112.0 06/17/2018 09:06 AM   TRIG 81  06/28/2013 07:11 AM   HDL 48.90 06/17/2018 09:06 AM   HDL 44 06/28/2013 07:11 AM   CHOLHDL 3 06/17/2018 09:06 AM   LDLCALC 63 06/17/2018 09:06 AM   LDLCALC 42 06/28/2013 07:11 AM    Wt Readings from Last 3 Encounters:  04/30/19 197 lb (89.4 kg)  02/18/19 200 lb 14.4 oz (91.1 kg)  02/08/19 201 lb 9.6 oz (91.4 kg)     Objective:    Vital Signs:  BP 128/64 (BP Location: Left Arm, Patient Position: Sitting, Cuff Size: Normal)   Pulse 88   Ht 5\' 4"  (1.626 m)   Wt 197 lb (89.4 kg)   BMI 33.81 kg/m    VITAL SIGNS:  reviewed GEN:  no acute distress  ASSESSMENT & PLAN:    PVCs: Rare palpitations noted by the patient.  EKG performed in 01/2019 showed sinus rhythm without any PVCs.  Prior echo in 2017 showed preserved LVEF.  As Ms. Greenfield is minimally symptomatic does not have any signs or symptoms of heart failure, I do not think that further testing or intervention is needed right now.  She should continue her current medications, including verapamil.  Hypertension: Blood pressure well controlled.  Continue current medications.  Hyperlipidemia: LDL less than 70 on last check in 05/2018, which is at goal given history of diabetes mellitus.  Ms. Packham should continue atorvastatin 10 mg daily.  I will defer ongoing management to Ms. Arnett.  COVID-19 Education: The signs and symptoms of COVID-19 were discussed with the patient and how to seek care for testing (follow up with PCP or arrange E-visit).  The importance of social distancing was discussed today.  Time:   Today, I have spent 10 minutes with the patient with telehealth technology discussing the above problems.     Medication Adjustments/Labs and Tests Ordered: Current medicines are reviewed at length with the patient today.  Concerns regarding medicines are outlined above.   Tests Ordered: None.  Medication Changes: None.  Disposition:  Follow up in 1 year(s)  Signed, [name], MD  04/30/2019 9:04 AM    [facility]

## 2019-04-28 NOTE — Telephone Encounter (Signed)
Agree with plan. Tashae Inda, NP  

## 2019-04-30 ENCOUNTER — Telehealth (INDEPENDENT_AMBULATORY_CARE_PROVIDER_SITE_OTHER): Payer: No Typology Code available for payment source | Admitting: Internal Medicine

## 2019-04-30 ENCOUNTER — Encounter: Payer: Self-pay | Admitting: Internal Medicine

## 2019-04-30 ENCOUNTER — Other Ambulatory Visit: Payer: Self-pay

## 2019-04-30 VITALS — BP 128/64 | HR 88 | Ht 64.0 in | Wt 197.0 lb

## 2019-04-30 DIAGNOSIS — I493 Ventricular premature depolarization: Secondary | ICD-10-CM

## 2019-04-30 DIAGNOSIS — Z7189 Other specified counseling: Secondary | ICD-10-CM | POA: Diagnosis not present

## 2019-04-30 DIAGNOSIS — I10 Essential (primary) hypertension: Secondary | ICD-10-CM

## 2019-04-30 DIAGNOSIS — E785 Hyperlipidemia, unspecified: Secondary | ICD-10-CM | POA: Diagnosis not present

## 2019-04-30 NOTE — Patient Instructions (Signed)
Medication Instructions:  Your physician recommends that you continue on your current medications as directed. Please refer to the Current Medication list given to you today.  If you need a refill on your cardiac medications before your next appointment, please call your pharmacy.   Lab work: none If you have labs (blood work) drawn today and your tests are completely normal, you will receive your results only by: . MyChart Message (if you have MyChart) OR . A paper copy in the mail If you have any lab test that is abnormal or we need to change your treatment, we will call you to review the results.  Testing/Procedures: none  Follow-Up: At [facility], you and your health needs are our priority.  As part of our continuing mission to provide you with exceptional heart care, we have created designated Provider Care Teams.  These Care Teams include your primary Cardiologist (physician) and Advanced Practice Providers (APPs -  Physician Assistants and Nurse Practitioners) who all work together to provide you with the care you need, when you need it. You will need a follow up appointment in 12 months.  Please call our office 2 months in advance to schedule this appointment.  You may see Christopher End, MD or one of the following Advanced Practice Providers on your designated Care Team:   Christopher Berge, NP Ryan Dunn, PA-C . Jacquelyn Visser, PA-C    

## 2019-05-03 ENCOUNTER — Other Ambulatory Visit: Payer: Self-pay | Admitting: Family

## 2019-05-03 DIAGNOSIS — E119 Type 2 diabetes mellitus without complications: Secondary | ICD-10-CM

## 2019-05-15 IMAGING — MR MR LUMBAR SPINE W/O CM
5 series · 37 of 48 positions shown · non-contrast
Comparison: None.

CLINICAL DATA: Pain from the groin to the right leg. Has been
ongoing for 1 week.

EXAM:
MRI LUMBAR SPINE WITHOUT CONTRAST
TECHNIQUE: Multiplanar, multisequence MR imaging of the lumbar spine was
performed. No intravenous contrast was administered.

[Series 2: T2 · sagittal · 4.0mm · 0.81mm/px · 6 of 17 slices shown (1 of 2)]
[im 1/17]
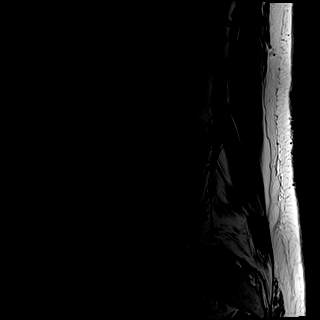
[im 4/17]
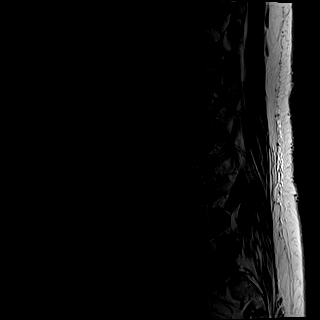
[im 7/17]
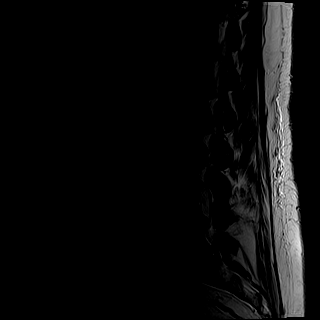
[im 10/17]
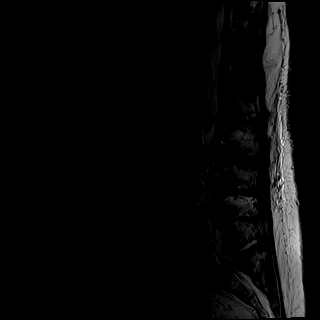
[im 13/17]
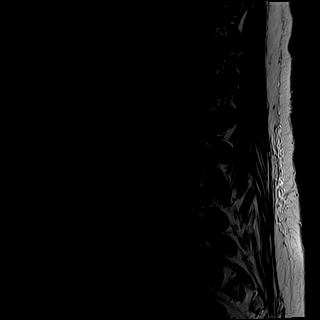
[im 17/17]
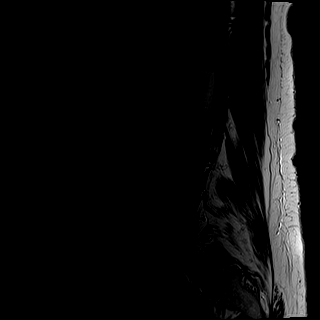

[Series 3: T1 · sagittal · 4.0mm · 0.81mm/px · 7 of 17 slices shown (1 of 2)]
[im 1/17]
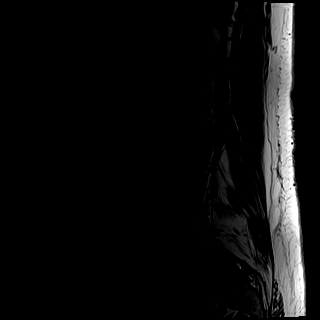
[im 3/17]
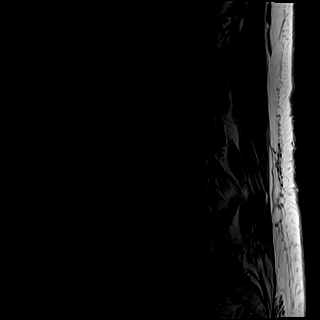
[im 6/17]
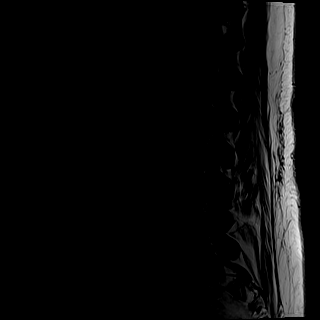
[im 9/17]
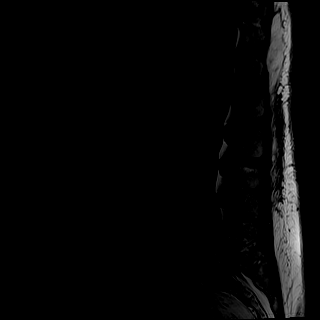
[im 11/17]
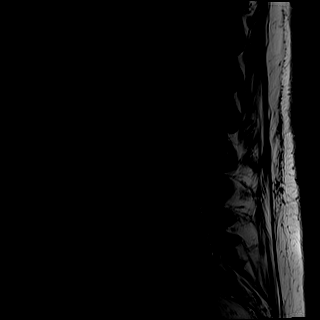
[im 14/17]
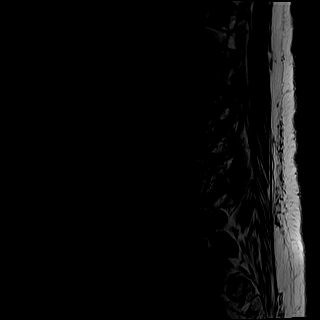
[im 17/17]
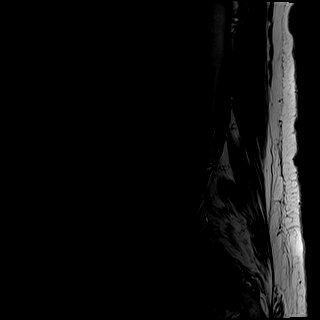

[Series 4: STIR · sagittal · 4.0mm · 1.02mm/px · 7 of 17 slices shown]
[im 1/17]
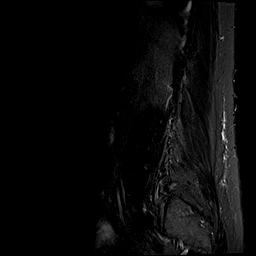
[im 3/17]
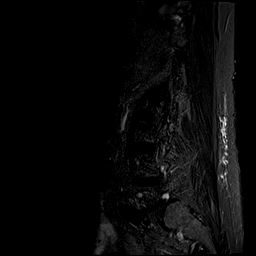
[im 6/17]
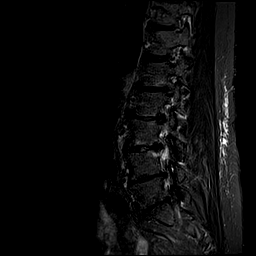
[im 9/17]
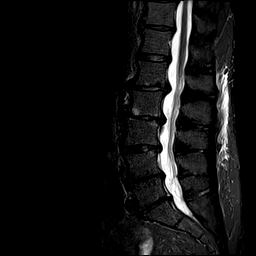
[im 11/17]
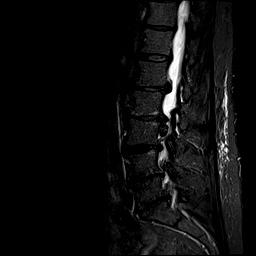
[im 14/17]
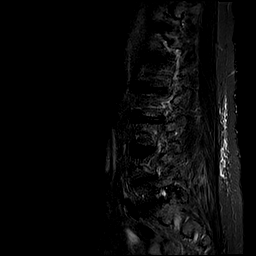
[im 17/17]
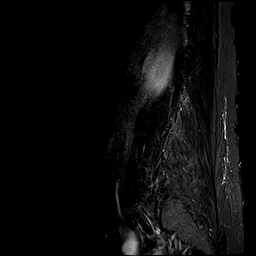

[Series 5: T2 · axial · 4.0mm · 0.78mm/px · z∈[-25,+174]mm · 9 of 37 slices shown (2 of 2)]
[im 1/37]
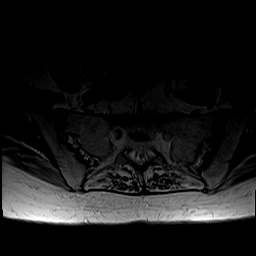
[im 3/37]
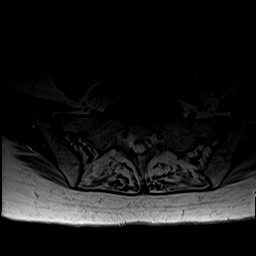
[im 6/37]
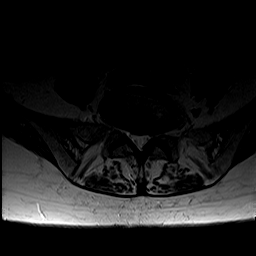
[im 12/37]
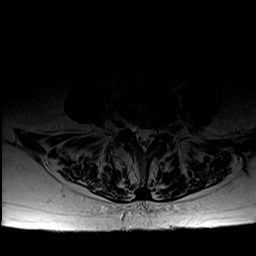
[im 17/37]
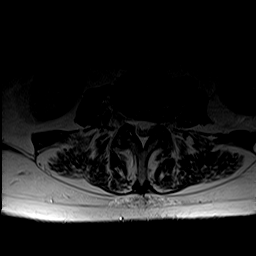
[im 20/37]
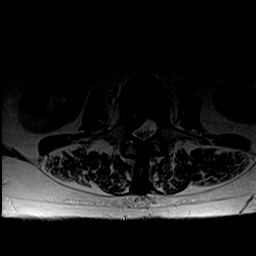
[im 25/37]
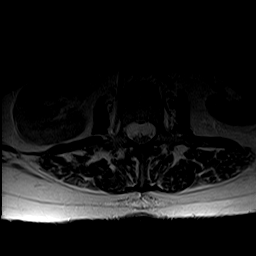
[im 31/37]
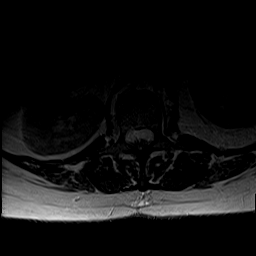
[im 37/37]
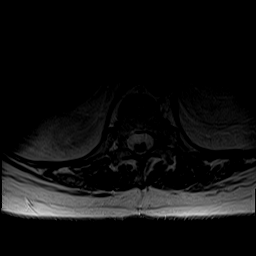

[Series 6: T1 · axial · 4.0mm · 0.39mm/px · z∈[-25,+174]mm · 8 of 37 slices shown (2 of 2)]
[im 1/37]
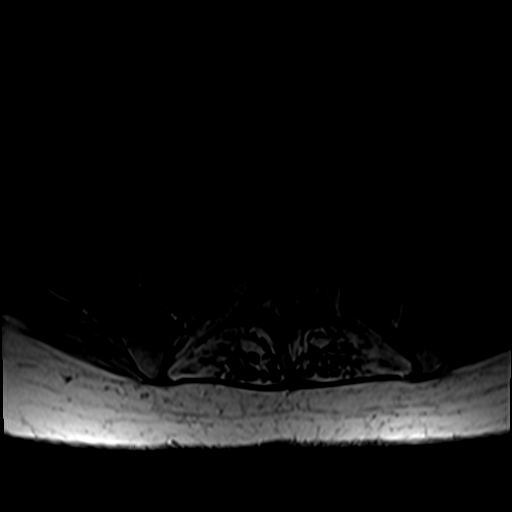
[im 6/37]
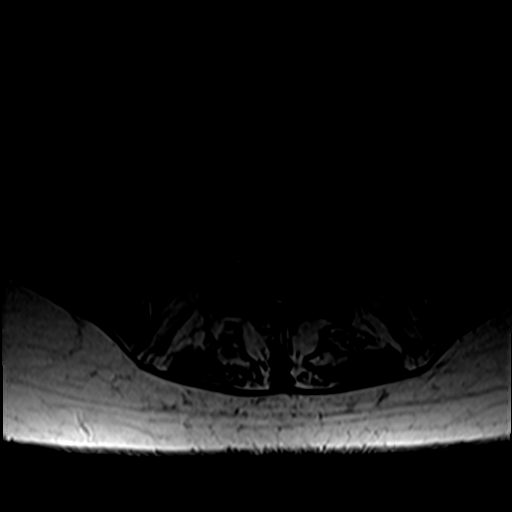
[im 12/37]
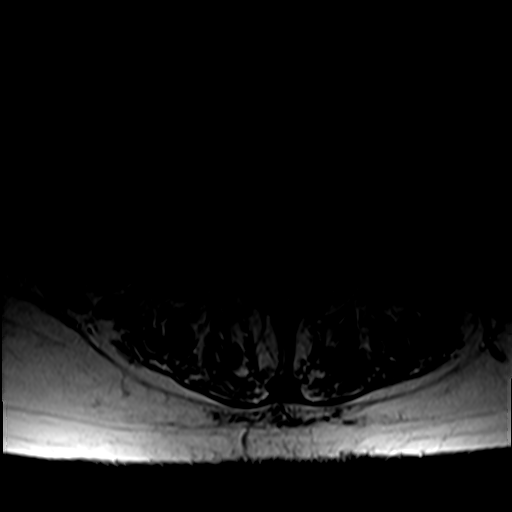
[im 17/37]
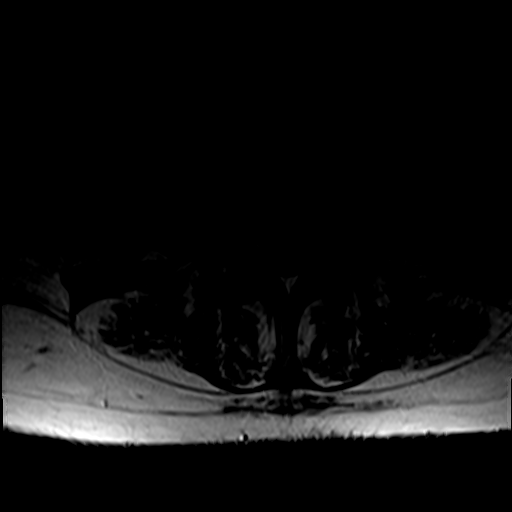
[im 20/37]
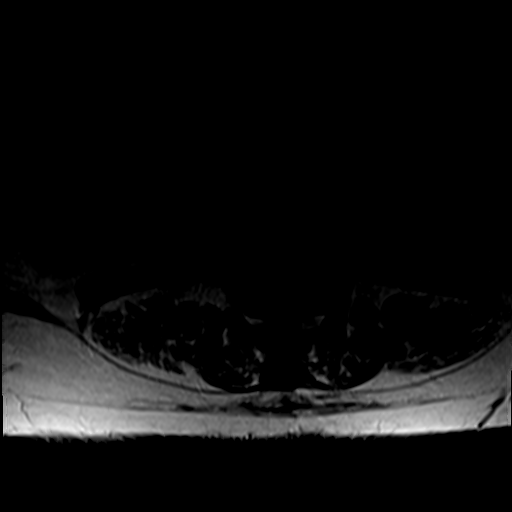
[im 25/37]
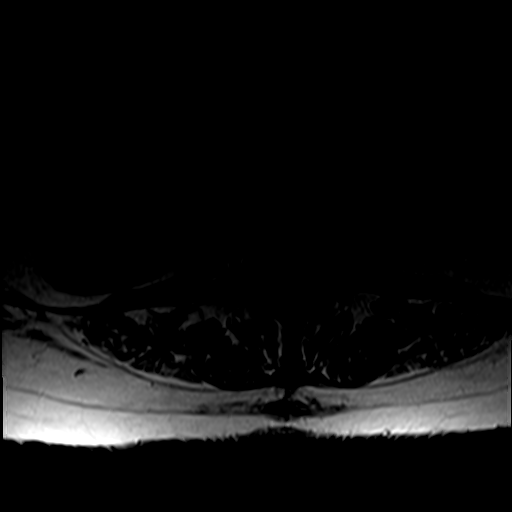
[im 31/37]
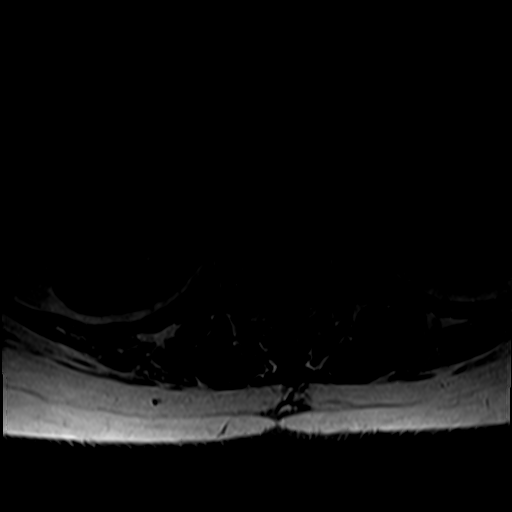
[im 37/37]
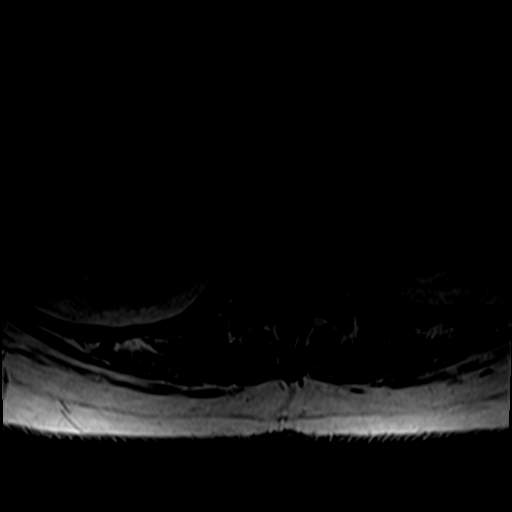

[37 of 48 positions shown; findings below may reference images not displayed]

FINDINGS: Segmentation:  Standard.

Alignment:  2 mm retrolisthesis of L1 on L2 and L2 on L3.

Vertebrae:  No fracture, evidence of discitis, or bone lesion.

Conus medullaris and cauda equina: Conus extends to the T12-L1
level. Conus and cauda equina appear normal.

Paraspinal and other soft tissues: No paraspinal abnormality.

Disc levels:

Disc spaces: Degenerative disc disease with disc height loss at
L1-2, L2-3, L4-5 and L5-S1.

T10-11: Mild broad-based disc bulge. Partially visualized on the
sagittal images.

T11-12: Mild broad-based disc bulge. Mild bilateral facet
arthropathy. No foraminal or central canal stenosis.

T12-L1: Mild broad-based disc bulge. No evidence of neural foraminal
stenosis. No central canal stenosis.

L1-L2: Small upturning right paracentral disc protrusion. Mild right
lateral recess narrowing. Mild bilateral facet arthropathy. No
evidence of neural foraminal stenosis. Mild central canal narrowing.

L2-L3: Broad-based disc bulge with a large right paracentral disc
extrusion with caudal migration of disc material and mass effect on
the right intraspinal L3 nerve root. Disc extrusion measures 10 x 10
x 14 mm. Mild bilateral facet arthropathy. Moderate spinal stenosis.
No evidence of neural foraminal stenosis.

L3-L4: Broad-based disc bulge flattening the ventral thecal sac.
Mild bilateral facet arthropathy. Bilateral lateral recess
narrowing. No evidence of neural foraminal stenosis. Mild spinal
stenosis.

L4-L5: Broad-based disc bulge. Mild bilateral facet arthropathy.
Bilateral lateral recess narrowing. No evidence of neural foraminal
stenosis. No central canal stenosis.

L5-S1: Broad-based disc bulge. Mild bilateral facet arthropathy. No
evidence of neural foraminal stenosis. No central canal stenosis.
IMPRESSION: 1. At L2-3 there is a broad-based disc bulge with a large right
paracentral disc extrusion with caudal migration of disc material
and mass effect on the right intraspinal L3 nerve root. Mild
bilateral facet arthropathy. Moderate spinal stenosis.
2. At L1-2 there is a small upturning right paracentral disc
protrusion. Mild right lateral recess narrowing. Mild bilateral
facet arthropathy.

## 2019-05-17 ENCOUNTER — Other Ambulatory Visit: Payer: Self-pay | Admitting: Family

## 2019-05-17 DIAGNOSIS — Z1231 Encounter for screening mammogram for malignant neoplasm of breast: Secondary | ICD-10-CM

## 2019-05-19 ENCOUNTER — Ambulatory Visit: Payer: 59 | Admitting: Family

## 2019-05-21 ENCOUNTER — Other Ambulatory Visit: Payer: Self-pay

## 2019-05-21 ENCOUNTER — Encounter: Payer: Self-pay | Admitting: Family

## 2019-05-21 ENCOUNTER — Ambulatory Visit (INDEPENDENT_AMBULATORY_CARE_PROVIDER_SITE_OTHER): Payer: No Typology Code available for payment source | Admitting: Family

## 2019-05-21 DIAGNOSIS — G4733 Obstructive sleep apnea (adult) (pediatric): Secondary | ICD-10-CM

## 2019-05-21 DIAGNOSIS — K219 Gastro-esophageal reflux disease without esophagitis: Secondary | ICD-10-CM | POA: Diagnosis not present

## 2019-05-21 DIAGNOSIS — E119 Type 2 diabetes mellitus without complications: Secondary | ICD-10-CM | POA: Diagnosis not present

## 2019-05-21 DIAGNOSIS — I10 Essential (primary) hypertension: Secondary | ICD-10-CM | POA: Diagnosis not present

## 2019-05-21 NOTE — Progress Notes (Signed)
This visit type was conducted due to national recommendations for restrictions regarding the COVID-19 pandemic (e.g. social distancing).  This format is felt to be most appropriate for this patient at this time.  All issues noted in this document were discussed and addressed.  No physical exam was performed (except for noted visual exam findings with Video Visits). Virtual Visit via Video Note  I connected with@  on 05/21/19 at 10:00 AM EDT by a video enabled telemedicine application and verified that I am speaking with the correct person using two identifiers.  Location patient: home Location provider:work Persons participating in the virtual visit: patient, provider  I discussed the limitations of evaluation and management by telemedicine and the availability of in person appointments. The patient expressed understanding and agreed to proceed.   HPI:  Feels well , no complaints.   HTN- around 120-130/70. Denies exertional chest pain or pressure, numbness or tingling radiating to left arm or jaw,  dizziness, frequent headaches, changes in vision, or shortness of breath.   DM- FBG have been ranging 118,114 in morning. Taking blood sugar 2 hours after largest meal ( usually dinner), around 140-160. No hypoglycemic episodes.    Compliant basal insulin, degludec 20 units, metformin, victoza, jardiance  OSA- compliant with cipap.   GERD- taking prilosec as had been on celebrex. Since off celebrex, weaning off priolsec now. No trouble swallowing, regurgitation.   S/p rtkr- feeling 'great.'   Dr End 04/30/2019- no further testing/intervention for PVCs  ROS: See pertinent positives and negatives per HPI.  Past Medical History:  Diagnosis Date  . Allergy    hay fever  . Ankle fracture, left 04/08/2014  . Arthritis   . Asthma   . Blood transfusion abn reaction or complication, no procedure mishap   . Depression   . Diabetes mellitus   . Disc displacement, lumbar   . Fatty liver   .  GERD (gastroesophageal reflux disease)   . Headache(784.0)   . Hyperlipidemia   . Hypertension   . Migraine   . Obstructive sleep apnea    CPAP  . PONV (postoperative nausea and vomiting)   . Sleep apnea 04/06/2017  . UTI (lower urinary tract infection)   . Wears glasses     Past Surgical History:  Procedure Laterality Date  . APPENDECTOMY  1977  . COLONOSCOPY    . KNEE ARTHROPLASTY Right 03/03/2019   Procedure: COMPUTER ASSISTED TOTAL KNEE ARTHROPLASTY-RIGHT;  Surgeon: Dereck Leep, MD;  Location: [facility];  Service: Orthopedics;  Laterality: Right;  . LUMBAR LAMINECTOMY/DECOMPRESSION MICRODISCECTOMY Right 02/06/2018   Procedure: Lumbar two Hemilaminectomy for Discectomy;  Surgeon: Ashok Pall, MD;  Location: [place];  Service: Neurosurgery;  Laterality: Right;  . ORIF ANKLE FRACTURE Left 04/08/2014   Procedure: OPEN REDUCTION INTERNAL FIXATION (ORIF) ANKLE FRACTURE LEFT ANKLE FRACTURE OPEN TREATMENT BIMALLEOLAR ANKLE INCLUDES INTERNAL FIXATION;  Surgeon: Johnny Bridge, MD;  Location: [place];  Service: Orthopedics;  Laterality: Left;  . PAROTIDECTOMY  1980   left  . TONSILLECTOMY AND ADENOIDECTOMY     as a child  . 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 TUBAL LIGATION  05/1976    Family History  Problem Relation Age of Onset  . Sudden death Mother   . Cancer Mother 71       pancreatic  . Heart disease Father 53  . Sudden death Father   . Cancer Brother        colon  . Stroke  Maternal Grandmother   . Heart disease Maternal Grandfather   . Cancer Sister        brain  . Breast cancer Neg Hx     SOCIAL HX: never smoker  Current Outpatient Medications:  .  albuterol (PROVENTIL HFA;VENTOLIN HFA) 108 (90 Base) MCG/ACT inhaler, Inhale 2 puffs into the lungs every 6 (six) hours as needed for wheezing or shortness of breath., Disp: , Rfl:  .  aspirin 81 MG tablet, Take 81 mg by mouth daily., Disp: , Rfl:  .  atorvastatin (LIPITOR) 10 MG tablet, TAKE  1 TABLET BY MOUTH AT BEDTIME., Disp: 90 tablet, Rfl: 1 .  b complex vitamins tablet, Take 1 tablet by mouth daily., Disp: , Rfl:  .  cholecalciferol (VITAMIN D3) 25 MCG (1000 UT) tablet, Take 1,000 Units by mouth daily., Disp: , Rfl:  .  empagliflozin (JARDIANCE) 25 MG TABS tablet, Take 25 mg by mouth daily., Disp: 90 tablet, Rfl: 1 .  Fluticasone-Salmeterol (ADVAIR DISKUS) 250-50 MCG/DOSE AEPB, INHALE 1 PUFF INTO THE LUNGS 2 TIMES DAILY., Disp: 60 each, Rfl: 6 .  glucose blood (FREESTYLE LITE) test strip, Use as instructed to check sugar up to 3 times daily, Disp: 100 each, Rfl: 12 .  insulin degludec (TRESIBA FLEXTOUCH) 100 UNIT/ML SOPN FlexTouch Pen, Inject 0.1 mLs (10 Units total) into the skin daily. Inject 10 units daily. Titrate as instructed. Max dose 30 units. (Patient taking differently: Inject 20 Units into the skin daily. ), Disp: 9 mL, Rfl: 2 .  Insulin Pen Needle (BD PEN NEEDLE NANO U/F) 32G X 4 MM MISC, USE ONCE A DAY AS DIRECTED WITH DIABETES MEDICATION PENS, Disp: 100 each, Rfl: 5 .  liraglutide (VICTOZA) 18 MG/3ML SOPN, INJECT 1.8 MG UNDER THE SKIN AT BEDTIME, Disp: 9 mL, Rfl: 3 .  loratadine (CLARITIN) 10 MG tablet, Take 10 mg by mouth daily., Disp: , Rfl:  .  losartan (COZAAR) 25 MG tablet, TAKE 1 TABLET (25 MG TOTAL) BY MOUTH DAILY., Disp: 90 tablet, Rfl: 0 .  montelukast (SINGULAIR) 10 MG tablet, Take 1 tablet (10 mg total) by mouth at bedtime., Disp: 90 tablet, Rfl: 1 .  Multiple Vitamins-Minerals (CENTRUM SILVER PO), Take 1 tablet by mouth daily. , Disp: , Rfl:  .  omeprazole (PRILOSEC) 20 MG capsule, TAKE 1 CAPSULE BY MOUTH DAILY., Disp: 90 capsule, Rfl: 0 .  venlafaxine XR (EFFEXOR-XR) 150 MG 24 hr capsule, TAKE 1 CAPSULE BY MOUTH DAILY WITH BREAKFAST. (Patient taking differently: Take 150 mg by mouth daily with breakfast. TAKE 1 CAPSULE BY MOUTH DAILY WITH BREAKFAST.), Disp: 90 capsule, Rfl: 1 .  Verapamil HCl CR 300 MG CP24, TAKE 1 CAPSULE BY MOUTH DAILY (Patient taking  differently: Take 300 mg by mouth daily. ), Disp: 90 capsule, Rfl: 1 .  meclizine (ANTIVERT) 25 MG tablet, Take 1 tablet (25 mg total) by mouth 3 (three) times daily as needed for dizziness. (Patient not taking: Reported on 05/21/2019), Disp: 30 tablet, Rfl: 0 .  metFORMIN (GLUCOPHAGE) 1000 MG tablet, Take 1 tablet (1,000 mg total) by mouth 2 (two) times daily with a meal., Disp: 180 tablet, Rfl: 3  EXAM:  VITALS per patient if applicable:  GENERAL: alert, oriented, appears well and in no acute distress  HEENT: atraumatic, conjunttiva clear, no obvious abnormalities on inspection of external nose and ears  NECK: normal movements of the head and neck  LUNGS: on inspection no signs of respiratory distress, breathing rate appears normal, no obvious gross SOB, gasping or  wheezing  CV: no obvious cyanosis  MS: moves all visible extremities without noticeable abnormality  PSYCH/NEURO: pleasant and cooperative, no obvious depression or anxiety, speech and thought processing grossly intact  ASSESSMENT AND PLAN:  Discussed the following assessment and plan:  Essential hypertension - Plan: Hemoglobin A1c, Comprehensive metabolic panel  OSA (obstructive sleep apnea)  Controlled type 2 diabetes mellitus without complication, without long-term current use of insulin (HCC)  Gastroesophageal reflux disease, esophagitis presence not specified Problem List Items Addressed This Visit      Cardiovascular and Mediastinum   Essential hypertension - Primary    BP Readings from Last 3 Encounters:  04/30/19 128/64  03/05/19 (!) 149/69  02/18/19 (!) 156/78    At goal, continue current regimen      Relevant Orders   Hemoglobin A1c   Comprehensive metabolic panel     Respiratory   OSA (obstructive sleep apnea)    compliant        Digestive   GERD (gastroesophageal reflux disease)    Weaning off of Prilosec which I agreed with.  Education provided on long-term use of PPIs.  Patient will  let me know if she is able to come off completely.        Endocrine   Diabetes mellitus type 2, controlled ([place])    Doing well on regimen, pending A1c            I discussed the assessment and treatment plan with the patient. The patient was provided an opportunity to ask questions and all were answered. The patient agreed with the plan and demonstrated an understanding of the instructions.   The patient was advised to call back or seek an in-person evaluation if the symptoms worsen or if the condition fails to improve as anticipated.   Vanessa Paris, FNP

## 2019-05-21 NOTE — Patient Instructions (Addendum)
NON Fasting labs. Please eat prior to labs.   Continue to wean of prilosec. Let me know if you come off the prilosec so I can remove from chart.   See below regarding prilosec  Long term use beyond 3 months of proton pump inhibitors , also called PPI's, is associated with malabsorption of vitamins, chronic kidney disease, fracture risk, and diarrheal illnesses. PPI's include Nexium, Prilosec, Protonix, Dexilant, and Prevacid.   I generally recommend trying to control acid reflux with lifestyle modifications including avoiding trigger foods, not eating 2 hours prior to bedtime. You may use histamine 2 blockers daily to twice daily ( this is Pepcid) and then when symptoms flare, start back on PPI for short course.   Of note, we will need to do an endoscopy ( upper GI) to evaluate your esophagus, stomach in the future if acid reflux persists are you develop red flag symptoms: trouble swallowing, hoarseness, chronic cough, unexplained weight loss.    Let me know if you need anything at all.

## 2019-05-21 NOTE — Assessment & Plan Note (Signed)
BP Readings from Last 3 Encounters:  04/30/19 128/64  03/05/19 (!) 149/69  02/18/19 (!) 156/78    At goal, continue current regimen

## 2019-05-21 NOTE — Progress Notes (Signed)
Printed and mailed

## 2019-05-21 NOTE — Assessment & Plan Note (Signed)
Weaning off of Prilosec which I agreed with.  Education provided on long-term use of PPIs.  Patient will let me know if she is able to come off completely.

## 2019-05-21 NOTE — Assessment & Plan Note (Signed)
compliant

## 2019-05-21 NOTE — Assessment & Plan Note (Signed)
Doing well on regimen, pending A1c

## 2019-05-31 ENCOUNTER — Ambulatory Visit: Payer: No Typology Code available for payment source

## 2019-06-03 ENCOUNTER — Telehealth: Payer: Self-pay | Admitting: *Deleted

## 2019-06-03 NOTE — Telephone Encounter (Signed)
Copied from [place] [id number]. Topic: Appointment Scheduling - Scheduling Inquiry for Clinic >> Jun 03, 2019 10:51 AM Vanessa Higgins wrote: Reason for CRM: Attempted to contact the office, number stated it was out of service. Pt called requesting to have her appt for 06/07/2019 rescheduled. She is requesting the appt be moved to 06/10/2019. Please advise.

## 2019-06-07 ENCOUNTER — Other Ambulatory Visit: Payer: No Typology Code available for payment source

## 2019-06-09 ENCOUNTER — Other Ambulatory Visit: Payer: Self-pay | Admitting: Family

## 2019-06-09 DIAGNOSIS — I10 Essential (primary) hypertension: Secondary | ICD-10-CM

## 2019-06-09 DIAGNOSIS — E119 Type 2 diabetes mellitus without complications: Secondary | ICD-10-CM

## 2019-06-10 ENCOUNTER — Other Ambulatory Visit (INDEPENDENT_AMBULATORY_CARE_PROVIDER_SITE_OTHER): Payer: No Typology Code available for payment source

## 2019-06-10 ENCOUNTER — Other Ambulatory Visit: Payer: Self-pay

## 2019-06-10 DIAGNOSIS — I10 Essential (primary) hypertension: Secondary | ICD-10-CM

## 2019-06-10 LAB — COMPREHENSIVE METABOLIC PANEL
ALT: 16 U/L (ref 0–35)
AST: 19 U/L (ref 0–37)
Albumin: 4.1 g/dL (ref 3.5–5.2)
Alkaline Phosphatase: 45 U/L (ref 39–117)
BUN: 17 mg/dL (ref 6–23)
CO2: 29 mEq/L (ref 19–32)
Calcium: 9.1 mg/dL (ref 8.4–10.5)
Chloride: 102 mEq/L (ref 96–112)
Creatinine, Ser: 0.57 mg/dL (ref 0.40–1.20)
GFR: 104.9 mL/min (ref >60.00)
Glucose, Bld: 130 mg/dL — ABNORMAL HIGH (ref 70–99)
Potassium: 4.3 mEq/L (ref 3.5–5.1)
Sodium: 138 mEq/L (ref 135–145)
Total Bilirubin: 0.7 mg/dL (ref 0.2–1.2)
Total Protein: 7.3 g/dL (ref 6.0–8.3)

## 2019-06-10 LAB — HEMOGLOBIN A1C: Hgb A1c MFr Bld: 7.3 % — ABNORMAL HIGH (ref 4.6–6.5)

## 2019-06-11 ENCOUNTER — Ambulatory Visit: Payer: Self-pay

## 2019-06-11 ENCOUNTER — Other Ambulatory Visit: Payer: Self-pay | Admitting: Family

## 2019-06-11 DIAGNOSIS — E119 Type 2 diabetes mellitus without complications: Secondary | ICD-10-CM

## 2019-06-14 ENCOUNTER — Other Ambulatory Visit: Payer: Self-pay | Admitting: Family

## 2019-06-14 DIAGNOSIS — E119 Type 2 diabetes mellitus without complications: Secondary | ICD-10-CM

## 2019-06-16 ENCOUNTER — Ambulatory Visit: Payer: No Typology Code available for payment source

## 2019-06-28 ENCOUNTER — Other Ambulatory Visit: Payer: Self-pay

## 2019-06-28 ENCOUNTER — Encounter: Payer: No Typology Code available for payment source | Attending: Family | Admitting: Dietician

## 2019-06-28 ENCOUNTER — Encounter: Payer: Self-pay | Admitting: Dietician

## 2019-06-28 VITALS — Ht 64.0 in | Wt 205.0 lb

## 2019-06-28 DIAGNOSIS — E119 Type 2 diabetes mellitus without complications: Secondary | ICD-10-CM

## 2019-06-28 DIAGNOSIS — Z794 Long term (current) use of insulin: Secondary | ICD-10-CM

## 2019-06-28 NOTE — Progress Notes (Signed)
Medical Nutrition Therapy: Visit start time: 0930  end time: 1030  Assessment:  Diagnosis: [place] 2 Diabetes Past medical history: HTN, hyperlipidemia, diverticulosis, GERD, arthritis, asthma, sleep apnea Psychosocial issues/ stress concerns: history of depression, anxiety  Preferred learning method:  . Auditory   Current weight: 205.0lbs  Height: 5'4" Medications, supplements: reconciled list in medical record  Progress and evaluation:   Patient reports recent increase in HbA1C to 7.2%.   She has begun working to decrease sugar intake, states she will typically drink sweet tea with dinner meal, but other sweets only occasionally.   Physical activity has decreased since work hours have decreased with pandemic. She reports upcoming return to normal work hours.   Patient would like to lose weight, her weight goal is about 180lbs.   Physical activity: walking sporadically  Dietary Intake:  Usual eating pattern includes 3 meals and 0-1 snacks per day. Dining out frequency: 4 meals per week.  Breakfast:  cereal with fruit and 2% milk; toast with butter and preserves; Belvita biscuit pkg. + orange juice Snack: sometimes part of Belvita pkg; graham cracker with peanut butter Lunch: 11a-1pm -- salad or [place] with 2 vegetables Snack: none or same as am Supper: meat + vegetables ie cucumber, tomato, cantaloupe, pinto beans, peas, limits potatoes Snack: none or same as am Beverages: water, sweet tea, small amount of juice  Nutrition Care Education: Topics covered: diabetes, weight control Basic nutrition: basic food groups, appropriate nutrient balance, appropriate meal and snack schedule, general nutrition guidelines, benefits of diet and lifestyle changes on blood sugar, BP, lipids.     Weight control:  Calculated energy needs for weight loss at about 1400kcal, and provided guidance for carbohydrate, protein, and fat intake for appropriate balance.  Diabetes: appropriate meal and snack  schedule, appropriate carb intake and balance, healthy carb choices, appropriate portion sizes, basic meal planning using plate method, how to count combination foods, role of physical activity   Nutritional Diagnosis:  [place]-2.2 Altered nutrition-related laboratory As related to Type 2 Diabetes.  As evidenced by patient with recent HbA1C of 7.2%. [place]-3.3 Overweight/obesity As related to history of excess calories and inadequate physical activity.  As evidenced by patient with BMI of 35.  Intervention:   Instruction as noted above.  Patient has been working on lifestyle change to improve BG control and lose weight.  Established goals for further dietary improvement.   Education Materials given:  . General diet guidelines for Diabetes . Plate Planner with food lists . Combination Foods handout . Sample meal pattern/ menus . Goals/ instructions   Learner/ who was taught:  . Patient    Level of understanding: Marland Kitchen Verbalizes/ demonstrates competency   Demonstrated degree of understanding via:   Teach back Learning barriers: . None  Willingness to learn/ readiness for change: . Eager, change in progress   Monitoring and Evaluation:  Dietary intake, exercise,  control, and body weight      follow up: 08/09/19

## 2019-06-28 NOTE — Patient Instructions (Signed)
   Control portions of carbohydrate foods, keep to 3 servings with each meal (fist-size). Choose high-fiber, whole grains when possible.   Continue to eat plenty of vegetables and fruits, great job!  Keep working to decrease sugar in tea, can decrease gradually by 1/4 or 1/3 cup every few weeks to adjust, and/or substitute with Splenda (sucralose) or try Truvia (stevia) sweeteners.   Resuming a higher level of activity with more work hours might help decrease blood sugar; or increasing the frequency of walking or other activity at home.

## 2019-07-09 ENCOUNTER — Other Ambulatory Visit: Payer: Self-pay | Admitting: Family

## 2019-07-12 ENCOUNTER — Other Ambulatory Visit: Payer: Self-pay

## 2019-07-12 ENCOUNTER — Ambulatory Visit
Admission: RE | Admit: 2019-07-12 | Discharge: 2019-07-12 | Disposition: A | Discharge status: Home / Self Care | Payer: No Typology Code available for payment source | Source: Ambulatory Visit | Attending: Family | Admitting: Family

## 2019-07-12 ENCOUNTER — Other Ambulatory Visit: Payer: Self-pay | Admitting: Family

## 2019-07-12 DIAGNOSIS — R928 Other abnormal and inconclusive findings on diagnostic imaging of breast: Secondary | ICD-10-CM

## 2019-07-12 DIAGNOSIS — N6320 Unspecified lump in the left breast, unspecified quadrant: Secondary | ICD-10-CM

## 2019-07-12 DIAGNOSIS — Z1231 Encounter for screening mammogram for malignant neoplasm of breast: Secondary | ICD-10-CM

## 2019-07-20 ENCOUNTER — Ambulatory Visit
Admission: RE | Admit: 2019-07-20 | Discharge: 2019-07-20 | Disposition: A | Discharge status: Home / Self Care | Payer: No Typology Code available for payment source | Source: Ambulatory Visit | Attending: Family | Admitting: Family

## 2019-07-20 DIAGNOSIS — N6320 Unspecified lump in the left breast, unspecified quadrant: Secondary | ICD-10-CM | POA: Diagnosis present

## 2019-07-20 DIAGNOSIS — R928 Other abnormal and inconclusive findings on diagnostic imaging of breast: Secondary | ICD-10-CM | POA: Insufficient documentation

## 2019-08-03 ENCOUNTER — Telehealth: Payer: Self-pay | Admitting: Family

## 2019-08-03 DIAGNOSIS — M545 Low back pain, unspecified: Secondary | ICD-10-CM

## 2019-08-03 DIAGNOSIS — G8929 Other chronic pain: Secondary | ICD-10-CM

## 2019-08-03 NOTE — Telephone Encounter (Signed)
Relation to pt: self  Call back number:[phone]    Reason for call:  Patient requesting a referral to Dr. Marylyn Ishihara L. [facility] neurosurgery  [address], [place], [place] [address] [phone] due to back pain.

## 2019-08-04 ENCOUNTER — Other Ambulatory Visit: Payer: Self-pay | Admitting: Family

## 2019-08-04 NOTE — Progress Notes (Signed)
close

## 2019-08-04 NOTE — Telephone Encounter (Signed)
I spoke with patient & she will call back if feels she needs a virtual. Patient informed that referral was placed.

## 2019-08-04 NOTE — Telephone Encounter (Signed)
Call patient Referral has been placed.  Certainly offer an appointment with [name] to discuss back pain if she would like.    Please advise her to give her office a call in ONE Week if she is not heard from [name] in regards to an appointment.

## 2019-08-09 ENCOUNTER — Ambulatory Visit: Payer: No Typology Code available for payment source | Admitting: Dietician

## 2019-08-09 ENCOUNTER — Encounter: Payer: Self-pay | Admitting: Dietician
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
## 2019-08-10 ENCOUNTER — Other Ambulatory Visit: Payer: Self-pay | Admitting: Family

## 2019-08-10 DIAGNOSIS — J45909 Unspecified asthma, uncomplicated: Secondary | ICD-10-CM

## 2019-08-24 ENCOUNTER — Other Ambulatory Visit: Payer: Self-pay | Admitting: Internal Medicine

## 2019-08-24 DIAGNOSIS — E119 Type 2 diabetes mellitus without complications: Secondary | ICD-10-CM

## 2019-09-01 ENCOUNTER — Ambulatory Visit (INDEPENDENT_AMBULATORY_CARE_PROVIDER_SITE_OTHER): Payer: No Typology Code available for payment source | Admitting: Family

## 2019-09-01 ENCOUNTER — Ambulatory Visit: Payer: No Typology Code available for payment source | Admitting: Family

## 2019-09-01 ENCOUNTER — Ambulatory Visit (INDEPENDENT_AMBULATORY_CARE_PROVIDER_SITE_OTHER): Payer: No Typology Code available for payment source

## 2019-09-01 ENCOUNTER — Other Ambulatory Visit: Payer: Self-pay

## 2019-09-01 VITALS — BP 118/68 | HR 86 | Temp 98.6°F | Resp 18 | Ht 64.0 in | Wt 204.6 lb

## 2019-09-01 DIAGNOSIS — Z794 Long term (current) use of insulin: Secondary | ICD-10-CM

## 2019-09-01 DIAGNOSIS — Z1211 Encounter for screening for malignant neoplasm of colon: Secondary | ICD-10-CM

## 2019-09-01 DIAGNOSIS — M79672 Pain in left foot: Secondary | ICD-10-CM

## 2019-09-01 DIAGNOSIS — I10 Essential (primary) hypertension: Secondary | ICD-10-CM | POA: Diagnosis not present

## 2019-09-01 DIAGNOSIS — E119 Type 2 diabetes mellitus without complications: Secondary | ICD-10-CM

## 2019-09-01 NOTE — Assessment & Plan Note (Signed)
At goal, continue regimen. 

## 2019-09-01 NOTE — Patient Instructions (Addendum)
Please schedule annual physical with my colleague Lauren Guse  Trial of compression stocking, heat versus ice.  may also use anti-inflammatory such as Mobic however do not use medication every day.    If conservative therapy does not work, I would like you to see podiatry.    Today we discussed referrals, orders.podiatry, GI for colonoscopy   I have placed these orders in the system for you.  Please be sure to give [name] a call if you have not heard from our office regarding this. We should hear from [name] within ONE week with information regarding your appointment. If not, please let me know immediately.    Stay safe!

## 2019-09-01 NOTE — Assessment & Plan Note (Signed)
Presume improved, will check A1c with CPE

## 2019-09-01 NOTE — Assessment & Plan Note (Signed)
Concern with possible stress fracture, pending x-rays at this time.  Discussed with patient conservative therapy including meloxicam which she already has at home for short course, trial of heat versus ice.  She will also use compression stockings.  Relatively benign exam today.  Referral is placed for podiatry in case conservative therapy does not improve.  Patient will let us know
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
## 2019-09-02 ENCOUNTER — Other Ambulatory Visit: Payer: Self-pay | Admitting: Family

## 2019-09-02 DIAGNOSIS — M79672 Pain in left foot: Secondary | ICD-10-CM

## 2019-09-03 ENCOUNTER — Encounter: Payer: Self-pay | Admitting: Dietician

## 2019-09-03 NOTE — Progress Notes (Signed)
Have not heard back from patient to reschedule her cancelled appointment from 08/09/19. Sent letter to referring provider.

## 2019-09-07 ENCOUNTER — Other Ambulatory Visit: Payer: Self-pay | Admitting: Family

## 2019-09-07 ENCOUNTER — Telehealth: Payer: Self-pay

## 2019-09-07 DIAGNOSIS — I10 Essential (primary) hypertension: Secondary | ICD-10-CM

## 2019-09-07 NOTE — Telephone Encounter (Signed)
Copied from [place] [id number]. Topic: Referral - Question >> Sep 07, 2019  1:57 PM Sheran Luz wrote: Patient inquired if ortho referral could be resent to   [facility] [phone] Fax# [phone]

## 2019-09-07 NOTE — Telephone Encounter (Signed)
Patient requesting referral to another Ortho [facility]?

## 2019-09-08 ENCOUNTER — Other Ambulatory Visit: Payer: Self-pay | Admitting: Family

## 2019-09-08 DIAGNOSIS — M5126 Other intervertebral disc displacement, lumbar region: Secondary | ICD-10-CM

## 2019-09-08 NOTE — Telephone Encounter (Signed)
Done, referral sent.

## 2019-09-08 NOTE — Progress Notes (Signed)
Patient notified

## 2019-09-08 NOTE — Progress Notes (Unsigned)
Advise pt that referral sent Call the office if she doesn't hear from [name] in regards to an appt

## 2019-09-09 ENCOUNTER — Telehealth: Payer: Self-pay | Admitting: Family

## 2019-09-09 NOTE — Telephone Encounter (Signed)
CD burned & given to patient while she waited in office.

## 2019-09-09 NOTE — Telephone Encounter (Signed)
Pt is needing a copy of her xray

## 2019-09-23 ENCOUNTER — Other Ambulatory Visit: Payer: Self-pay

## 2019-09-23 ENCOUNTER — Telehealth: Payer: Self-pay | Admitting: Family

## 2019-09-23 MED ORDER — BD PEN NEEDLE NANO U/F 32G X 4 MM MISC: 5 refills | Status: DC

## 2019-09-23 NOTE — Telephone Encounter (Signed)
Encounter signed in error   °

## 2019-09-23 NOTE — Telephone Encounter (Signed)
I have corrected script & sent to [facility].

## 2019-09-23 NOTE — Telephone Encounter (Signed)
Called the pharmacy and was told they need a new prescription written for the patient to use the needles  2 x per day.They will contact the insurance company once this is sent .

## 2019-09-23 NOTE — Telephone Encounter (Signed)
Medication: Insulin Pen Needle (BD PEN NEEDLE NANO U/F) 32G X 4 MM MISC  Pt needs new rx.  Pt is using needles 2 x per day. Please update rx   Preferred Pharmacy:  [facility], [address] [phone] (Phone) [phone] (Fax)     Pt was advised that RX refills may take up to 3 business days. We ask that you follow-up with your pharmacy.

## 2019-09-27 ENCOUNTER — Other Ambulatory Visit: Payer: Self-pay | Admitting: Family

## 2019-09-27 DIAGNOSIS — E119 Type 2 diabetes mellitus without complications: Secondary | ICD-10-CM

## 2019-10-12 ENCOUNTER — Encounter: Payer: Self-pay | Admitting: Family Medicine

## 2019-10-12 ENCOUNTER — Ambulatory Visit (INDEPENDENT_AMBULATORY_CARE_PROVIDER_SITE_OTHER): Payer: No Typology Code available for payment source | Admitting: Family Medicine

## 2019-10-12 ENCOUNTER — Other Ambulatory Visit: Payer: Self-pay

## 2019-10-12 VITALS — BP 122/62 | HR 72 | Temp 97.4°F | Ht 64.0 in | Wt 205.4 lb

## 2019-10-12 DIAGNOSIS — E785 Hyperlipidemia, unspecified: Secondary | ICD-10-CM | POA: Diagnosis not present

## 2019-10-12 DIAGNOSIS — I10 Essential (primary) hypertension: Secondary | ICD-10-CM | POA: Diagnosis not present

## 2019-10-12 DIAGNOSIS — Z794 Long term (current) use of insulin: Secondary | ICD-10-CM

## 2019-10-12 DIAGNOSIS — N390 Urinary tract infection, site not specified: Secondary | ICD-10-CM

## 2019-10-12 DIAGNOSIS — E119 Type 2 diabetes mellitus without complications: Secondary | ICD-10-CM

## 2019-10-12 DIAGNOSIS — Z1211 Encounter for screening for malignant neoplasm of colon: Secondary | ICD-10-CM | POA: Diagnosis not present

## 2019-10-12 DIAGNOSIS — R350 Frequency of micturition: Secondary | ICD-10-CM | POA: Diagnosis not present

## 2019-10-12 DIAGNOSIS — Z0000 Encounter for general adult medical examination without abnormal findings: Secondary | ICD-10-CM

## 2019-10-12 LAB — CBC WITH DIFFERENTIAL/PLATELET
Basophils Absolute: 0 10*3/uL (ref 0.0–0.1)
Basophils Relative: 0.7 % (ref 0.0–3.0)
Eosinophils Absolute: 0.3 10*3/uL (ref 0.0–0.7)
Eosinophils Relative: 5.5 % — ABNORMAL HIGH (ref 0.0–5.0)
HCT: 33.8 % — ABNORMAL LOW (ref 36.0–46.0)
Hemoglobin: 10.3 g/dL — ABNORMAL LOW (ref 12.0–15.0)
Lymphocytes Relative: 33.4 % (ref 12.0–46.0)
Lymphs Abs: 2 10*3/uL (ref 0.7–4.0)
MCHC: 30.5 g/dL (ref 30.0–36.0)
MCV: 70.9 fl — ABNORMAL LOW (ref 78.0–100.0)
Monocytes Absolute: 0.5 10*3/uL (ref 0.1–1.0)
Monocytes Relative: 8.4 % (ref 3.0–12.0)
Neutro Abs: 3.1 10*3/uL (ref 1.4–7.7)
Neutrophils Relative %: 52 % (ref 43.0–77.0)
Platelets: 462 10*3/uL — ABNORMAL HIGH (ref 150.0–400.0)
RBC: 4.77 Mil/uL (ref 3.87–5.11)
RDW: 20.4 % — ABNORMAL HIGH (ref 11.5–15.5)
WBC: 5.9 10*3/uL (ref 4.0–10.5)

## 2019-10-12 LAB — COMPREHENSIVE METABOLIC PANEL
ALT: 21 U/L (ref 0–35)
AST: 21 U/L (ref 0–37)
Albumin: 4.4 g/dL (ref 3.5–5.2)
Alkaline Phosphatase: 57 U/L (ref 39–117)
BUN: 14 mg/dL (ref 6–23)
CO2: 29 mEq/L (ref 19–32)
Calcium: 9.5 mg/dL (ref 8.4–10.5)
Chloride: 101 mEq/L (ref 96–112)
Creatinine, Ser: 0.51 mg/dL (ref 0.40–1.20)
GFR: 119.15 mL/min (ref >60.00)
Glucose, Bld: 123 mg/dL — ABNORMAL HIGH (ref 70–99)
Potassium: 4.2 mEq/L (ref 3.5–5.1)
Sodium: 138 mEq/L (ref 135–145)
Total Bilirubin: 0.7 mg/dL (ref 0.2–1.2)
Total Protein: 8 g/dL (ref 6.0–8.3)

## 2019-10-12 LAB — URINALYSIS, ROUTINE W REFLEX MICROSCOPIC
Bilirubin Urine: NEGATIVE
Hgb urine dipstick: NEGATIVE
Ketones, ur: NEGATIVE
Leukocytes,Ua: NEGATIVE
Nitrite: POSITIVE — AB
RBC / HPF: NONE SEEN (ref 0–?)
Specific Gravity, Urine: 1.02 (ref 1.000–1.030)
Total Protein, Urine: NEGATIVE
Urine Glucose: >=1000 — AB
Urobilinogen, UA: 0.2 (ref 0.0–1.0)
pH: 5.5 (ref 5.0–8.0)

## 2019-10-12 LAB — LIPID PANEL
Cholesterol: 212 mg/dL — ABNORMAL HIGH (ref 0–200)
HDL: 46.8 mg/dL (ref >39.00)
LDL Cholesterol: 137 mg/dL — ABNORMAL HIGH (ref 0–99)
NonHDL: 165.34
Total CHOL/HDL Ratio: 5
Triglycerides: 143 mg/dL (ref 0.0–149.0)
VLDL: 28.6 mg/dL (ref 0.0–40.0)

## 2019-10-12 LAB — B12 AND FOLATE PANEL
Folate: 12.7 ng/mL (ref >5.9)
Vitamin B-12: 176 pg/mL — ABNORMAL LOW (ref 211–911)

## 2019-10-12 LAB — HEMOGLOBIN A1C: Hgb A1c MFr Bld: 7.1 % — ABNORMAL HIGH (ref 4.6–6.5)

## 2019-10-12 LAB — VITAMIN D 25 HYDROXY (VIT D DEFICIENCY, FRACTURES): VITD: 44.31 ng/mL (ref 30.00–100.00)

## 2019-10-12 LAB — TSH: TSH: 0.87 u[IU]/mL (ref 0.35–4.50)

## 2019-10-12 NOTE — Progress Notes (Signed)
Annual

## 2019-10-12 NOTE — Progress Notes (Signed)
Subjective:    Patient ID: Vanessa Higgins, female    DOB: 1951/06/18, 68 y.o.   MRN: [id number]  HPI   Patient presents to clinic for complete physical exam and also annual blood work to follow-up on cholesterol, diabetes blood pressure.  Mammogram was done in July 2020.  DEXA bone density scan done in December 2019.  Colonoscopy is due this November.  Has ophthalmology appointment scheduled for November 2020 as well.  Overall patient is feeling well.  Does have some urinary frequency she has been experiencing the past 2 nights, has gotten up more than usual.  Otherwise denies pain for visible blood in the urine.  Would like urine checked.  Patient does see the dentist every 6 months.  Did have a flu vaccine already for the season through her employer.  Has been tolerating all her medications without any problems.  Blood pressures have been well controlled, blood sugars usually have been ranging between 100-150.  Denies hyper or hypoglycemia.   Patient Active Problem List   Diagnosis Date Noted   Left foot pain 09/01/2019   H/O adenomatous polyp of colon 03/03/2019   Total knee replacement status 03/03/2019   Depression, major, single episode, complete remission ([place]) 11/18/2018   HNP (herniated nucleus pulposus), lumbar 02/06/2018   Fatty liver disease, nonalcoholic 25/42/7062   GERD (gastroesophageal reflux disease) 10/15/2017   PVC's (premature ventricular contractions) 08/01/2017   OSA (obstructive sleep apnea) 04/21/2017   Vertigo 03/26/2017   Elevated liver enzymes 12/03/2016   Cough 06/05/2016   Irregular heart rate 06/05/2016   IDA (iron deficiency anemia) 10/20/2014   Routine general medical examination at a health care facility 05/03/2013   Diabetes mellitus type 2, controlled ([place]) 07/09/2012   Essential hypertension 07/09/2012   Hyperlipidemia LDL goal <70 07/09/2012   Asthma 07/09/2012   Obesity 07/09/2012   Social History   Tobacco Use    Smoking status: Never Smoker   Smokeless tobacco: Never Used  Substance Use Topics   Alcohol use: No   Past Surgical History:  Procedure Laterality Date   APPENDECTOMY  1977   COLONOSCOPY     KNEE ARTHROPLASTY Right 03/03/2019   Procedure: COMPUTER ASSISTED TOTAL KNEE ARTHROPLASTY-RIGHT;  Surgeon: Dereck Leep, MD;  Location: [facility];  Service: Orthopedics;  Laterality: Right;   LUMBAR LAMINECTOMY/DECOMPRESSION MICRODISCECTOMY Right 02/06/2018   Procedure: Lumbar two Hemilaminectomy for Discectomy;  Surgeon: Ashok Pall, MD;  Location: [facility];  Service: Neurosurgery;  Laterality: Right;   ORIF ANKLE FRACTURE Left 04/08/2014   Procedure: OPEN REDUCTION INTERNAL FIXATION (ORIF) ANKLE FRACTURE LEFT ANKLE FRACTURE OPEN TREATMENT BIMALLEOLAR ANKLE INCLUDES INTERNAL FIXATION;  Surgeon: Johnny Bridge, MD;  Location: [facility];  Service: Orthopedics;  Laterality: Left;   PAROTIDECTOMY  1980   left   TONSILLECTOMY AND ADENOIDECTOMY     as a child   TOTAL KNEE ARTHROPLASTY  09/2011   left   TUBAL LIGATION  05/1976   Family History  Problem Relation Age of Onset   Sudden death Mother    Cancer Mother 52       pancreatic   Heart disease Father 92   Sudden death Father    Cancer Brother        colon   Stroke Maternal Grandmother    Heart disease Maternal Grandfather    Cancer Sister        brain   Breast cancer Neg Hx    Review of Systems  Constitutional: Negative for  chills, fatigue and fever.  HENT: Negative for congestion, ear pain, sinus pain and sore throat.   Eyes: Negative.   Respiratory: Negative for cough, shortness of breath and wheezing.   Cardiovascular: Negative for chest pain, palpitations and leg swelling.  Gastrointestinal: Negative for abdominal pain, diarrhea, nausea and vomiting.  Genitourinary: Negative for dysuria, frequency and urgency.  Musculoskeletal: Negative for arthralgias and myalgias.  Skin: Negative for color  change, pallor and rash.  Neurological: Negative for syncope, light-headedness and headaches.  Psychiatric/Behavioral: The patient is not nervous/anxious.       Objective:   Physical Exam Vitals signs and nursing note reviewed.  Constitutional:      General: She is not in acute distress.    Appearance: She is not toxic-appearing.  HENT:     Head: Normocephalic and atraumatic.     Right Ear: Tympanic membrane, ear canal and external ear normal.     Left Ear: Tympanic membrane, ear canal and external ear normal.  Eyes:     General: No scleral icterus.    Extraocular Movements: Extraocular movements intact.     Conjunctiva/sclera: Conjunctivae normal.     Pupils: Pupils are equal, round, and reactive to light.  Neck:     Musculoskeletal: Normal range of motion and neck supple. No neck rigidity.     Vascular: No carotid bruit.  Cardiovascular:     Rate and Rhythm: Normal rate and regular rhythm.     Heart sounds: Normal heart sounds.  Pulmonary:     Effort: Pulmonary effort is normal. No respiratory distress.     Breath sounds: Normal breath sounds.  Chest:     Breasts:        Right: No swelling, bleeding, inverted nipple, mass, nipple discharge, skin change or tenderness.        Left: No swelling, bleeding, inverted nipple, mass, nipple discharge, skin change or tenderness.  Abdominal:     General: Bowel sounds are normal. There is no distension.     Palpations: Abdomen is soft. There is no mass.     Tenderness: There is no abdominal tenderness. There is no right CVA tenderness, left CVA tenderness, guarding or rebound.     Hernia: No hernia is present.  Musculoskeletal:     Right lower leg: No edema.     Left lower leg: No edema.  Lymphadenopathy:     Upper Body:     Right upper body: No supraclavicular, axillary or pectoral adenopathy.     Left upper body: No supraclavicular, axillary or pectoral adenopathy.  Skin:    General: Skin is warm and dry.     Coloration: Skin  is not jaundiced or pale.  Neurological:     General: No focal deficit present.     Mental Status: She is alert and oriented to person, place, and time.     Cranial Nerves: No cranial nerve deficit.     Motor: No weakness.     Gait: Gait normal.  Psychiatric:        Mood and Affect: Mood normal.        Behavior: Behavior normal.    Today's Vitals   10/12/19 0911  BP: 122/62  Pulse: 72  Temp: (!) 97.4 F (36.3 C)  TempSrc: Temporal  SpO2: 98%  Weight: 205 lb 6.4 oz (93.2 kg)  Height: _0  (1.626 m)   Body mass index is 35.26 kg/m.     Assessment & Plan:   Overall patient is a well-appearing 68 year old  female.  We will refer to gastroenterology for colonoscopy.  Mammogram is up-to-date, bone density is up-to-date and has upcoming eye doctor appointment.  Reviewed healthy diet and regular physical activity.  Discussed diet high in lean proteins, fruits and vegetables and low in processed type foods.  Also recommended keeping up good water intake and doing regular walking.  Discussed safe sun practices including wearing SPF of at least 30 when outdoors.  Patient always wears seatbelt in car.  Chronic medical issues stable & we will get annual blood work in clinic today.  1. Colon cancer screening  - Ambulatory referral to Gastroenterology  2. Well adult health check  - CBC w/Diff - Comp Met (CMET) - TSH - B12 and Folate Panel - VITAMIN D 25 Hydroxy (Vit-D Deficiency, Fractures)  3. Controlled type 2 diabetes mellitus without complication, with long-term current use of insulin (HCC)  - TSH - Hemoglobin A1c  4. Essential hypertension  - Comp Met (CMET) - TSH - B12 and Folate Panel - VITAMIN D 25 Hydroxy (Vit-D Deficiency, Fractures)  5. Hyperlipidemia, unspecified hyperlipidemia type  - Lipid panel  6. Urine frequency  - Urinalysis, Routine w reflex microscopic - Urine Culture   Patient will follow up as she is regularly with PCP every 3 to 6 months.

## 2019-10-14 LAB — URINE CULTURE
MICRO NUMBER:: 983951
SPECIMEN QUALITY:: ADEQUATE

## 2019-10-15 MED ORDER — CEFDINIR 300 MG PO CAPS: 300.0000 mg | ORAL_CAPSULE | Freq: Two times a day (BID) | ORAL | 0 refills | Status: DC

## 2019-10-15 NOTE — Addendum Note (Signed)
Addended by: Philis Nettle on: 10/15/2019 12:31 PM   Modules accepted: Orders

## 2019-10-19 ENCOUNTER — Encounter: Payer: Self-pay | Admitting: Lab

## 2019-10-25 ENCOUNTER — Telehealth: Payer: Self-pay | Admitting: Gastroenterology

## 2019-10-25 NOTE — Telephone Encounter (Signed)
Hey Dr. 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 She has had a previous colonoscopy and I am sending the records over for you to review. Thank you!

## 2019-10-26 NOTE — Telephone Encounter (Signed)
I know Vanessa Higgins very well I have reviewed her previous colonoscopy reports   Please set her up for colonoscopy   Thx  RG

## 2019-10-28 ENCOUNTER — Encounter: Payer: Self-pay | Admitting: Gastroenterology

## 2019-11-02 ENCOUNTER — Other Ambulatory Visit: Payer: Self-pay | Admitting: Family

## 2019-11-03 ENCOUNTER — Other Ambulatory Visit: Payer: Self-pay | Admitting: Family

## 2019-11-03 DIAGNOSIS — E119 Type 2 diabetes mellitus without complications: Secondary | ICD-10-CM

## 2019-11-24 ENCOUNTER — Encounter: Payer: Self-pay | Admitting: Gastroenterology

## 2019-11-24 ENCOUNTER — Ambulatory Visit (AMBULATORY_SURGERY_CENTER): Payer: Self-pay | Admitting: *Deleted

## 2019-11-24 ENCOUNTER — Other Ambulatory Visit: Payer: Self-pay

## 2019-11-24 VITALS — Temp 97.7°F | Ht 64.0 in | Wt 207.0 lb

## 2019-11-24 DIAGNOSIS — Z800 Family history of malignant neoplasm of digestive organs: Secondary | ICD-10-CM

## 2019-11-24 DIAGNOSIS — Z86010 Personal history of colonic polyps: Secondary | ICD-10-CM

## 2019-11-24 MED ORDER — NA SULFATE-K SULFATE-MG SULF 17.5-3.13-1.6 GM/177ML PO SOLN: ORAL | 0 refills | Status: DC

## 2019-11-24 NOTE — Progress Notes (Signed)
Patient is here in-person for PV. Patient denies any allergies to eggs or soy. Patient denies any problems with anesthesia/sedation. Patient denies any oxygen use at home. Patient denies taking any diet/weight loss medications or blood thinners. Patient is not being treated for MRSA or C-diff. EMMI education assisgned to the patient for the procedure, this was explained and instructions given to patient. COVID-19 screening test is on 12/4 at [facility], the pt is aware. Pt is aware that care partner will wait in the car during procedure; if they feel like they will be too hot or cold to wait in the car; they may wait in the 4 th floor lobby. Patient is aware to bring only one care partner. We want them to wear a mask (we do not have any that we can provide them), practice social distancing, and we will check their temperatures when they get here.  I did remind the patient that their care partner needs to stay in the parking lot the entire time and have a cell phone available, we will call them when the pt is ready for discharge. Patient will wear mask into building.

## 2019-11-30 ENCOUNTER — Other Ambulatory Visit: Payer: Self-pay | Admitting: Family

## 2019-11-30 DIAGNOSIS — E119 Type 2 diabetes mellitus without complications: Secondary | ICD-10-CM

## 2019-12-03 ENCOUNTER — Other Ambulatory Visit: Payer: Self-pay

## 2019-12-03 ENCOUNTER — Other Ambulatory Visit
Admission: RE | Admit: 2019-12-03 | Discharge: 2019-12-03 | Disposition: A | Discharge status: Home / Self Care | Payer: No Typology Code available for payment source | Source: Ambulatory Visit | Attending: Gastroenterology | Admitting: Gastroenterology

## 2019-12-03 DIAGNOSIS — Z01812 Encounter for preprocedural laboratory examination: Secondary | ICD-10-CM | POA: Diagnosis not present

## 2019-12-03 DIAGNOSIS — Z20828 Contact with and (suspected) exposure to other viral communicable diseases: Secondary | ICD-10-CM | POA: Diagnosis not present

## 2019-12-03 LAB — SARS CORONAVIRUS 2 (TAT 6-24 HRS): SARS Coronavirus 2: NEGATIVE

## 2019-12-08 ENCOUNTER — Other Ambulatory Visit: Payer: Self-pay

## 2019-12-08 ENCOUNTER — Encounter: Payer: Self-pay | Admitting: Gastroenterology

## 2019-12-08 ENCOUNTER — Ambulatory Visit (AMBULATORY_SURGERY_CENTER): Payer: No Typology Code available for payment source | Admitting: Gastroenterology

## 2019-12-08 VITALS — BP 144/60 | HR 76 | Temp 98.5°F | Resp 12 | Ht 64.0 in | Wt 207.0 lb

## 2019-12-08 DIAGNOSIS — Z800 Family history of malignant neoplasm of digestive organs: Secondary | ICD-10-CM | POA: Diagnosis not present

## 2019-12-08 DIAGNOSIS — Z86010 Personal history of colonic polyps: Secondary | ICD-10-CM

## 2019-12-08 MED ORDER — SODIUM CHLORIDE 0.9 % IV SOLN: 500.0000 mL | Freq: Once | INTRAVENOUS | Status: DC

## 2019-12-08 NOTE — Op Note (Signed)
[facility] Patient Name: Vanessa Higgins Procedure Date: 12/08/2019 8:55 AM MRN: [id number] Endoscopist: Jackquline Denmark , MD Age: 68 Referring MD:  Date of Birth: [birth year] Gender: Female Account #: [id number] Procedure:                Colonoscopy Indications:              High risk colon cancer surveillance: Personal                            history of colonic polyps. FH of colon cancer. Medicines:                Monitored Anesthesia Care Procedure:                Pre-Anesthesia Assessment:                           - Prior to the procedure, a History and Physical                            was performed, and patient medications and                            allergies were reviewed. The patient's tolerance of                            previous anesthesia was also reviewed. The risks                            and benefits of the procedure and the sedation                            options and risks were discussed with the patient.                            All questions were answered, and informed consent                            was obtained. Prior Anticoagulants: The patient has                            taken no previous anticoagulant or antiplatelet                            agents. ASA Grade Assessment: II - A patient with                            mild systemic disease. After reviewing the risks                            and benefits, the patient was deemed in                            satisfactory condition to undergo the procedure.  After obtaining informed consent, the colonoscope                            was passed under direct vision. Throughout the                            procedure, the patient's blood pressure, pulse, and                            oxygen saturations were monitored continuously. The                            Colonoscope was introduced through the anus and                            advanced to the 2 cm  into the ileum. The                            colonoscopy was performed without difficulty. The                            patient tolerated the procedure well. The quality                            of the bowel preparation was good. The terminal                            ileum, ileocecal valve, appendiceal orifice, and                            rectum were photographed. Scope In: 9:01:47 AM Scope Out: 9:17:06 AM Scope Withdrawal Time: 0 hours 9 minutes 37 seconds  Total Procedure Duration: 0 hours 15 minutes 19 seconds  Findings:                 Tattoo was identified in mid transverse colon. The                            area was carefully examined. No recurrent/residual                            polyps.                           Multiple medium-mouthed diverticula were found in                            the sigmoid colon and descending colon.                           Non-bleeding internal hemorrhoids were found during                            retroflexion. The hemorrhoids were small.  The terminal ileum appeared normal.                           The exam was otherwise without abnormality on                            direct and retroflexion views. Complications:            No immediate complications. Estimated Blood Loss:     Estimated blood loss: none. Impression:               -Moderate left colonic diverticulosis.                           -Otherwise normal colonoscopy to TI. Recommendation:           - Patient has a contact number available for                            emergencies. The signs and symptoms of potential                            delayed complications were discussed with the                            patient. Return to normal activities tomorrow.                            Written discharge instructions were provided to the                            patient.                           - Resume previous high-fiber diet.                            - Continue present medications.                           - Repeat colonoscopy in 5 years for screening                            purposes. Earlier, if with any new problems or                            change in family history.                           - Return to GI clinic PRN.                           - Left message for Cecille Rubin. Jackquline Denmark, MD 12/08/2019 9:26:25 AM This report has been signed electronically.

## 2019-12-08 NOTE — Patient Instructions (Signed)
Please, read all of the handouts given to you by your recovery room nurse.  You will need a recall colonsocopy in 5 years.  Thank-you for choosing [name] for your healthcare needs today.  YOU HAD AN ENDOSCOPIC PROCEDURE TODAY AT [facility]:   Refer to the procedure report that was given to you for any specific questions about what was found during the examination.  If the procedure report does not answer your questions, please call your gastroenterologist to clarify.  If you requested that your care partner not be given the details of your procedure findings, then the procedure report has been included in a sealed envelope for you to review at your convenience later.  YOU SHOULD EXPECT: Some feelings of bloating in the abdomen. Passage of more gas than usual.  Walking can help get rid of the air that was put into your GI tract during the procedure and reduce the bloating. If you had a lower endoscopy (such as a colonoscopy or flexible sigmoidoscopy) you may notice spotting of blood in your stool or on the toilet paper. If you underwent a bowel prep for your procedure, you may not have a normal bowel movement for a few days.  Please Note:  You might notice some irritation and congestion in your nose or some drainage.  This is from the oxygen used during your procedure.  There is no need for concern and it should clear up in a day or so.  SYMPTOMS TO REPORT IMMEDIATELY:   Following lower endoscopy (colonoscopy or flexible sigmoidoscopy):  Excessive amounts of blood in the stool  Significant tenderness or worsening of abdominal pains  Swelling of the abdomen that is new, acute  Fever of 100F or higher   For urgent or emergent issues, a gastroenterologist can be reached at any hour by calling [phone].   DIET:  We do recommend a small meal at first, but then you may proceed to your regular diet.  Drink plenty of fluids but you should avoid alcoholic beverages for 24 hours.  Try  to increase the fiber in your diet, and drink plenty of water.  ACTIVITY:  You should plan to take it easy for the rest of today and you should NOT DRIVE or use heavy machinery until tomorrow (because of the sedation medicines used during the test).    FOLLOW UP: Our staff will call the number listed on your records 48-72 hours following your procedure to check on you and address any questions or concerns that you may have regarding the information given to you following your procedure. If we do not reach you, we will leave a message.  We will attempt to reach you two times.  During this call, we will ask if you have developed any symptoms of COVID 19. If you develop any symptoms (ie: fever, flu-like symptoms, shortness of breath, cough etc.) before then, please call [phone].  If you test positive for Covid 19 in the 2 weeks post procedure, please call and report this information to [name].     SIGNATURES/CONFIDENTIALITY: You and/or your care partner have signed paperwork which will be entered into your electronic medical record.  These signatures attest to the fact that that the information above on your After Visit Summary has been reviewed and is understood.  Full responsibility of the confidentiality of this discharge information lies with you and/or your care-partner.

## 2019-12-08 NOTE — Progress Notes (Signed)
LC - Temp DT - VS  Pt's states no medical or surgical changes since previsit or office visit.  Albuterol Inhaler at the bedside with pt's label applied

## 2019-12-08 NOTE — Progress Notes (Signed)
Report given to PACU, vss 

## 2019-12-10 ENCOUNTER — Telehealth: Payer: Self-pay

## 2019-12-10 ENCOUNTER — Telehealth: Payer: Self-pay | Admitting: *Deleted

## 2019-12-10 NOTE — Telephone Encounter (Signed)
  Follow up Call-  Call back number 12/08/2019  Post procedure Call Back phone  # [phone] hm  Permission to leave phone message Yes  Some recent data might be hidden     Left message

## 2019-12-10 NOTE — Telephone Encounter (Signed)
Message left

## 2019-12-14 ENCOUNTER — Telehealth: Payer: Self-pay | Admitting: Family

## 2019-12-14 NOTE — Telephone Encounter (Signed)
Error

## 2019-12-27 ENCOUNTER — Other Ambulatory Visit: Payer: Self-pay | Admitting: Family

## 2020-01-26 LAB — HM DIABETES EYE EXAM

## 2020-02-01 ENCOUNTER — Other Ambulatory Visit: Payer: Self-pay | Admitting: Family

## 2020-02-01 DIAGNOSIS — J45909 Unspecified asthma, uncomplicated: Secondary | ICD-10-CM

## 2020-02-01 DIAGNOSIS — E119 Type 2 diabetes mellitus without complications: Secondary | ICD-10-CM

## 2020-03-01 ENCOUNTER — Other Ambulatory Visit: Payer: Self-pay | Admitting: Family

## 2020-03-01 DIAGNOSIS — I10 Essential (primary) hypertension: Secondary | ICD-10-CM

## 2020-03-01 DIAGNOSIS — E119 Type 2 diabetes mellitus without complications: Secondary | ICD-10-CM

## 2020-03-22 ENCOUNTER — Other Ambulatory Visit: Payer: Self-pay | Admitting: Family

## 2020-03-22 ENCOUNTER — Encounter: Payer: Self-pay | Admitting: Family

## 2020-03-22 ENCOUNTER — Other Ambulatory Visit: Payer: Self-pay

## 2020-03-22 ENCOUNTER — Ambulatory Visit (INDEPENDENT_AMBULATORY_CARE_PROVIDER_SITE_OTHER): Payer: No Typology Code available for payment source | Admitting: Family

## 2020-03-22 DIAGNOSIS — E119 Type 2 diabetes mellitus without complications: Secondary | ICD-10-CM | POA: Diagnosis not present

## 2020-03-22 DIAGNOSIS — E669 Obesity, unspecified: Secondary | ICD-10-CM

## 2020-03-22 DIAGNOSIS — J45909 Unspecified asthma, uncomplicated: Secondary | ICD-10-CM | POA: Diagnosis not present

## 2020-03-22 DIAGNOSIS — R300 Dysuria: Secondary | ICD-10-CM | POA: Diagnosis not present

## 2020-03-22 DIAGNOSIS — R899 Unspecified abnormal finding in specimens from other organs, systems and tissues: Secondary | ICD-10-CM

## 2020-03-22 LAB — COMPREHENSIVE METABOLIC PANEL
ALT: 31 U/L (ref 0–35)
AST: 35 U/L (ref 0–37)
Albumin: 4.1 g/dL (ref 3.5–5.2)
Alkaline Phosphatase: 50 U/L (ref 39–117)
BUN: 13 mg/dL (ref 6–23)
CO2: 28 mEq/L (ref 19–32)
Calcium: 9 mg/dL (ref 8.4–10.5)
Chloride: 103 mEq/L (ref 96–112)
Creatinine, Ser: 0.52 mg/dL (ref 0.40–1.20)
GFR: 116.36 mL/min (ref >60.00)
Glucose, Bld: 91 mg/dL (ref 70–99)
Potassium: 4.1 mEq/L (ref 3.5–5.1)
Sodium: 138 mEq/L (ref 135–145)
Total Bilirubin: 0.7 mg/dL (ref 0.2–1.2)
Total Protein: 7.3 g/dL (ref 6.0–8.3)

## 2020-03-22 LAB — LIPID PANEL
Cholesterol: 180 mg/dL (ref 0–200)
HDL: 39.7 mg/dL (ref >39.00)
LDL Cholesterol: 119 mg/dL — ABNORMAL HIGH (ref 0–99)
NonHDL: 140.68
Total CHOL/HDL Ratio: 5
Triglycerides: 109 mg/dL (ref 0.0–149.0)
VLDL: 21.8 mg/dL (ref 0.0–40.0)

## 2020-03-22 LAB — MICROALBUMIN / CREATININE URINE RATIO
Creatinine,U: 65.7 mg/dL
Microalb Creat Ratio: 2.5 mg/g (ref 0.0–30.0)
Microalb, Ur: 1.6 mg/dL (ref 0.0–1.9)

## 2020-03-22 LAB — URINALYSIS, ROUTINE W REFLEX MICROSCOPIC
Bilirubin Urine: NEGATIVE
Hgb urine dipstick: NEGATIVE
Ketones, ur: NEGATIVE
Leukocytes,Ua: NEGATIVE
Nitrite: NEGATIVE
RBC / HPF: NONE SEEN (ref 0–?)
Specific Gravity, Urine: 1.02 (ref 1.000–1.030)
Total Protein, Urine: NEGATIVE
Urine Glucose: >=1000 — AB
Urobilinogen, UA: 0.2 (ref 0.0–1.0)
pH: 6 (ref 5.0–8.0)

## 2020-03-22 LAB — HEMOGLOBIN A1C: Hgb A1c MFr Bld: 6.9 % — ABNORMAL HIGH (ref 4.6–6.5)

## 2020-03-22 MED ORDER — PREDNISONE 10 MG PO TABS: ORAL_TABLET | ORAL | 0 refills | Status: DC

## 2020-03-22 MED ORDER — TRESIBA FLEXTOUCH 100 UNIT/ML ~~LOC~~ SOPN: 20.0000 [IU] | PEN_INJECTOR | Freq: Every day | SUBCUTANEOUS | 2 refills | Status: DC

## 2020-03-22 NOTE — Progress Notes (Signed)
Subjective:    Patient ID: Vanessa Higgins, female    DOB: 1951/02/06, 69 y.o.   MRN: [id number]  CC: Vanessa Higgins is a 69 y.o. female who presents today for follow up.   HPI: Complains of Increased urinary frequency , started 2-3 days ago, unchanged.  Endorses dysuria.  NO fever, n, vomiting, hematuria.   HTN- at work, ' will get 130 or less' / 70. Compliant with medications. Denies exertional chest pain or pressure, numbness or tingling radiating to left arm or jaw, palpitations, dizziness, frequent headaches, changes in vision, or shortness of breath.   DM- compliant with tresiba 20 units , jardiance, and victoza. Has seen nutritionist.  FBG are running less than 130 in the mornings. Post prandials are below 170. NO yeast infection. NO hypoglycemic episodes.    Frustrated by weight gain. Feels that is fluctuating. Trace pedal BL edema.   Complains of gradual sob when walking to the car. No Sob when still. Noticed this last few weeks.  Thought it was her mask  or weight gain contributory. Endorses wet sounding cough x 3 months.  SOB limits her exercise.  Has been walking until the last 2 weeks with sob.  NO wheezing, cp, orthopnea, episgastric burning.  Using advair daily. Uses albuterol on occasion without much relief.  H/o asthma   GERD- no trouble or pain with swallowing . Symptoms controlled prilosec.   Due to follow up with Dr End.       echo- 08/2016. EF 60-65%.   HISTORY:  Past Medical History:  Diagnosis Date  . Allergy    hay fever  . Anemia   . Ankle fracture, left 04/08/2014  . Arthritis   . Asthma   . Blood transfusion abn reaction or complication, no procedure mishap   . Blood transfusion without reported diagnosis   . Depression   . Diabetes mellitus   . Disc displacement, lumbar   . Fatty liver   . GERD (gastroesophageal reflux disease)   . Headache(784.0)   . Hyperlipidemia   . Hypertension   . Migraine   . Obstructive sleep apnea    CPAP  .  PONV (postoperative nausea and vomiting)   . Sleep apnea 04/06/2017   uses CPAP  . UTI (lower urinary tract infection)   . Wears glasses    Past Surgical History:  Procedure Laterality Date  . APPENDECTOMY  1977  . COLONOSCOPY  last 11/17/2014  . KNEE ARTHROPLASTY Right 03/03/2019   Procedure: COMPUTER ASSISTED TOTAL KNEE ARTHROPLASTY-RIGHT;  Surgeon: Dereck Leep, MD;  Location: [facility];  Service: Orthopedics;  Laterality: Right;  . LUMBAR LAMINECTOMY/DECOMPRESSION MICRODISCECTOMY Right 02/06/2018   Procedure: Lumbar two Hemilaminectomy for Discectomy;  Surgeon: Ashok Pall, MD;  Location: [place];  Service: Neurosurgery;  Laterality: Right;  . ORIF ANKLE FRACTURE Left 04/08/2014   Procedure: OPEN REDUCTION INTERNAL FIXATION (ORIF) ANKLE FRACTURE LEFT ANKLE FRACTURE OPEN TREATMENT BIMALLEOLAR ANKLE INCLUDES INTERNAL FIXATION;  Surgeon: Johnny Bridge, MD;  Location: [place];  Service: Orthopedics;  Laterality: Left;  . PAROTIDECTOMY  1980   left  . POLYPECTOMY    . TONSILLECTOMY AND ADENOIDECTOMY     as a child  . 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 TUBAL LIGATION  05/1976   Family History  Problem Relation Age of Onset  . Sudden death Mother   . Cancer Mother 7       pancreatic  . Heart disease Father 78  .  Sudden death Father   . Cancer Brother        colon  . Colon cancer Brother 55  . Stroke Maternal Grandmother   . Heart disease Maternal Grandfather   . Cancer Sister        brain  . Breast cancer Neg Hx   . Colon polyps Neg Hx   . Esophageal cancer Neg Hx   . Rectal cancer Neg Hx   . Stomach cancer Neg Hx   . Thyroid cancer Neg Hx     Allergies: Sulfa antibiotics and Ramipril Current Outpatient Medications on File Prior to Visit  Medication Sig Dispense Refill  . albuterol (PROVENTIL HFA;VENTOLIN HFA) 108 (90 Base) MCG/ACT inhaler Inhale 2 puffs into the lungs every 6 (six) hours as needed for wheezing or shortness of breath.    Marland Kitchen  aspirin 81 MG tablet Take 81 mg by mouth daily.    Marland Kitchen atorvastatin (LIPITOR) 10 MG tablet TAKE 1 TABLET BY MOUTH AT BEDTIME. 90 tablet 1  . b complex vitamins tablet Take 1 tablet by mouth daily.    . cholecalciferol (VITAMIN D3) 25 MCG (1000 UT) tablet Take 1,000 Units by mouth daily.    . Fluticasone-Salmeterol (ADVAIR DISKUS) 250-50 MCG/DOSE AEPB INHALE 1 PUFF INTO THE LUNGS 2 TIMES DAILY. 60 each 6  . glucose blood (FREESTYLE LITE) test strip Use as instructed to check sugar up to 3 times daily 100 each 12  . Insulin Pen Needle (BD PEN NEEDLE NANO U/F) 32G X 4 MM MISC USE TWICE A DAY AS DIRECTED WITH DIABETES MEDICATION PENS 200 each 5  . JARDIANCE 25 MG TABS tablet TAKE 1 TABLET BY MOUTH ONCE DAILY 90 tablet 1  . loratadine (CLARITIN) 10 MG tablet Take 10 mg by mouth daily.    Marland Kitchen losartan (COZAAR) 25 MG tablet TAKE 1 TABLET BY MOUTH DAILY. 90 tablet 1  . meclizine (ANTIVERT) 25 MG tablet Take 1 tablet (25 mg total) by mouth 3 (three) times daily as needed for dizziness. 30 tablet 0  . metFORMIN (GLUCOPHAGE) 1000 MG tablet TAKE 1 TABLET BY MOUTH 2 TIMES DAILY WITH A MEAL. 180 tablet 3  . montelukast (SINGULAIR) 10 MG tablet TAKE 1 TABLET BY MOUTH AT BEDTIME 90 tablet 1  . Multiple Vitamins-Minerals (CENTRUM SILVER PO) Take 1 tablet by mouth daily.     Marland Kitchen omeprazole (PRILOSEC) 20 MG capsule TAKE 1 CAPSULE BY MOUTH DAILY. 90 capsule 0  . venlafaxine XR (EFFEXOR-XR) 150 MG 24 hr capsule TAKE 1 CAPSULE BY MOUTH DAILY WITH BREAKFAST. 90 capsule 1  . Verapamil HCl CR 300 MG CP24 TAKE 1 CAPSULE BY MOUTH DAILY 90 capsule 1  . VICTOZA 18 MG/3ML SOPN INJECT 1.8 MG UNDER THE SKIN AT BEDTIME 9 mL 3   No current facility-administered medications on file prior to visit.    Social History   Tobacco Use  . Smoking status: Never Smoker  . Smokeless tobacco: Never Used  Substance Use Topics  . Alcohol use: No  . Drug use: No    Review of Systems  Constitutional: Negative for chills and fever.  HENT:  Negative for congestion, sinus pressure and sore throat.   Respiratory: Positive for cough and shortness of breath.   Cardiovascular: Positive for leg swelling (trace). Negative for chest pain and palpitations.  Gastrointestinal: Negative for nausea and vomiting.  Genitourinary: Positive for dysuria and frequency.      Objective:    BP 134/62   Pulse 78   Temp Marland Kitchen)  96.8 F (36 C)   Resp 16   Wt 210 lb (95.3 kg)   SpO2 97%   BMI 36.05 kg/m  BP Readings from Last 3 Encounters:  03/22/20 134/62  12/08/19 (!) 144/60  10/12/19 122/62   Wt Readings from Last 3 Encounters:  03/22/20 210 lb (95.3 kg)  12/08/19 207 lb (93.9 kg)  11/24/19 207 lb (93.9 kg)    Physical Exam Vitals reviewed.  Constitutional:      Appearance: She is well-developed.  Eyes:     Conjunctiva/sclera: Conjunctivae normal.  Cardiovascular:     Rate and Rhythm: Normal rate and regular rhythm.     Pulses: Normal pulses.     Heart sounds: Normal heart sounds.     Comments: Very trace BL pedal edema. Non pitting.  Pulmonary:     Effort: Pulmonary effort is normal.     Breath sounds: Normal breath sounds. No wheezing, rhonchi or rales.  Skin:    General: Skin is warm and dry.  Neurological:     Mental Status: She is alert.  Psychiatric:        Speech: Speech normal.        Behavior: Behavior normal.        Thought Content: Thought content normal.        Assessment & Plan:   Problem List Items Addressed This Visit      Respiratory   Asthma    We walked throughout building today and patient was talking easily. No apparent SOB. No respiratory distress. Sa02 remained at 95% and HR 75. We agreed short burst of prednisone, CXR.       Relevant Medications   predniSONE (DELTASONE) 10 MG tablet   Other Relevant Orders   Comprehensive metabolic panel (Completed)   DG Chest 2 View     Endocrine   Diabetes mellitus type 2, controlled ([place])    Controlled, continue current regimen.      Relevant  Medications   insulin degludec (TRESIBA FLEXTOUCH) 100 UNIT/ML FlexTouch Pen   Other Relevant Orders   Hemoglobin A1c (Completed)   Lipid panel (Completed)   Urinalysis, Routine w reflex microscopic (Completed)   Microalbumin / creatinine urine ratio (Completed)   Urine Culture     Other   Dysuria    Patient well-appearing, nontoxic in appearance.  Patient declines empiric antibiotics started today.  She would like to wait on urine culture.  Pending.      Obesity    Referral to Severance beasley. Discussed deconditioning and weight contributory to sob.       Relevant Medications   insulin degludec (TRESIBA FLEXTOUCH) 100 UNIT/ML FlexTouch Pen   Other Relevant Orders   Amb Ref to Medical Weight Management       I have changed Vanessa Higgins's Tyler Aas FlexTouch. I am also having her start on predniSONE. Additionally, I am having her maintain her Multiple Vitamins-Minerals (CENTRUM SILVER PO), aspirin, b complex vitamins, loratadine, meclizine, albuterol, cholecalciferol, glucose blood, atorvastatin, BD Pen Needle Nano U/F, Fluticasone-Salmeterol, metFORMIN, venlafaxine XR, Jardiance, omeprazole, Victoza, Verapamil HCl CR, montelukast, and losartan.   Meds ordered this encounter  Medications  . insulin degludec (TRESIBA FLEXTOUCH) 100 UNIT/ML FlexTouch Pen    Sig: Inject 0.2 mLs (20 Units total) into the skin at bedtime.    Dispense:  15 mL    Refill:  2    Order Specific Question:   Supervising Provider    Answer:   Derrel Nip, TERESA L [[id number]]  . predniSONE (DELTASONE)  10 MG tablet    Sig: Take 40 mg by mouth on day 1, then taper 10 mg daily until gone    Dispense:  10 tablet    Refill:  0    Order Specific Question:   Supervising Provider    Answer:   Crecencio Mc [[id number]]    Return precautions given.   Risks, benefits, and alternatives of the medications and treatment plan prescribed today were discussed, and patient expressed understanding.   Education regarding symptom  management and diagnosis given to patient on AVS.  Continue to follow with Burnard Hawthorne, FNP for routine health maintenance.   [name] and I agreed with plan.   Mable Paris, FNP

## 2020-03-22 NOTE — Assessment & Plan Note (Signed)
Patient well-appearing, nontoxic in appearance.  Patient declines empiric antibiotics started today.  She would like to wait on urine culture.  Pending.

## 2020-03-22 NOTE — Assessment & Plan Note (Addendum)
Working diagnosis of shortness of breath most likely related to asthma flare.  We also discussed deconditioning, weight gain. Low suspicion for CHF however pending CXR to evaluate for cardiomegaly.  Also advised her to  follow-up with cardiology, Dr. Saunders Revel.  we walked throughout building today and patient was talking easily. No apparent SOB. No respiratory distress. Sa02 remained at 95% and HR 75. We agreed short burst of prednisone, CXR.  If no improvement will consider increasing Advair.  Patient will let me know how she is doing.

## 2020-03-22 NOTE — Assessment & Plan Note (Signed)
Referral to [place]. Discussed deconditioning and weight contributory to sob.

## 2020-03-22 NOTE — Assessment & Plan Note (Signed)
Controlled, continue current regimen

## 2020-03-22 NOTE — Patient Instructions (Addendum)
Please ensure follow-up with Dr. END as we discussed.  Let us know if you want me to call in regards to an appointment with Dr. Leafy Ro, medical weight loss  [facility] when [facility].   Let me know if you not better on the prednisone burst

## 2020-03-23 ENCOUNTER — Ambulatory Visit
Admission: RE | Admit: 2020-03-23 | Discharge: 2020-03-23 | Disposition: A | Discharge status: Home / Self Care | Payer: No Typology Code available for payment source | Source: Ambulatory Visit | Attending: Family | Admitting: Family

## 2020-03-23 ENCOUNTER — Encounter: Payer: Self-pay | Admitting: *Deleted

## 2020-03-23 ENCOUNTER — Telehealth: Payer: Self-pay | Admitting: *Deleted

## 2020-03-23 DIAGNOSIS — J45909 Unspecified asthma, uncomplicated: Secondary | ICD-10-CM | POA: Diagnosis present

## 2020-03-23 LAB — URINE CULTURE
MICRO NUMBER:: 10287429
SPECIMEN QUALITY:: ADEQUATE

## 2020-03-23 NOTE — Telephone Encounter (Signed)
Left message to call office

## 2020-03-23 NOTE — Telephone Encounter (Signed)
-----   Message from Burnard Hawthorne, [place] sent at 03/22/2020 10:52 PM EDT ----- CALL- We are still waiting on urine culture.   Normal electrolytes and kidney function.  No blood seen in urine A1c acceptable.LDL is not at goal.  Would like to see LDL less than 70 especially in the setting diabetes.  Please confirm that she is taking her Lipitor 10 mg every single day.  If she is and is agreebale, please increase Lipitor to 20 mg nightly and you send this in. Sch repeat cmp labs in 6 weeks if you increase lipitor;  I have ordered  Let me know if cannot reach patient.

## 2020-03-28 ENCOUNTER — Other Ambulatory Visit: Payer: Self-pay

## 2020-03-28 ENCOUNTER — Other Ambulatory Visit: Payer: Self-pay | Admitting: Family

## 2020-03-28 DIAGNOSIS — J45909 Unspecified asthma, uncomplicated: Secondary | ICD-10-CM

## 2020-03-28 MED ORDER — FLUTICASONE-SALMETEROL 500-50 MCG/DOSE IN AEPB: 1.0000 | INHALATION_SPRAY | Freq: Two times a day (BID) | RESPIRATORY_TRACT | 1 refills | Status: DC

## 2020-03-28 MED ORDER — ATORVASTATIN CALCIUM 20 MG PO TABS: 20.0000 mg | ORAL_TABLET | Freq: Every day | ORAL | 1 refills | Status: DC

## 2020-03-29 ENCOUNTER — Telehealth: Payer: Self-pay | Admitting: Family

## 2020-03-29 NOTE — Telephone Encounter (Signed)
Pt returned call for xray results. Please call back.

## 2020-03-29 NOTE — Telephone Encounter (Signed)
See result note & I have reviewed with patient.

## 2020-05-01 ENCOUNTER — Ambulatory Visit: Payer: No Typology Code available for payment source | Admitting: Pulmonary Disease

## 2020-05-01 ENCOUNTER — Encounter: Payer: Self-pay | Admitting: Pulmonary Disease

## 2020-05-01 ENCOUNTER — Other Ambulatory Visit: Payer: Self-pay | Admitting: Pulmonary Disease

## 2020-05-01 ENCOUNTER — Other Ambulatory Visit: Payer: Self-pay

## 2020-05-01 ENCOUNTER — Other Ambulatory Visit
Admission: RE | Admit: 2020-05-01 | Discharge: 2020-05-01 | Disposition: A | Discharge status: Home / Self Care | Payer: No Typology Code available for payment source | Attending: Pulmonary Disease | Admitting: Pulmonary Disease

## 2020-05-01 VITALS — BP 126/72 | HR 81 | Temp 97.3°F | Ht 64.0 in | Wt 210.0 lb

## 2020-05-01 DIAGNOSIS — J4540 Moderate persistent asthma, uncomplicated: Secondary | ICD-10-CM | POA: Diagnosis not present

## 2020-05-01 DIAGNOSIS — R05 Cough: Secondary | ICD-10-CM

## 2020-05-01 DIAGNOSIS — R059 Cough, unspecified: Secondary | ICD-10-CM

## 2020-05-01 DIAGNOSIS — G4733 Obstructive sleep apnea (adult) (pediatric): Secondary | ICD-10-CM

## 2020-05-01 LAB — CBC WITH DIFFERENTIAL/PLATELET
Abs Immature Granulocytes: 0.02 10*3/uL (ref 0.00–0.07)
Basophils Absolute: 0.1 10*3/uL (ref 0.0–0.1)
Basophils Relative: 1 %
Eosinophils Absolute: 0.3 10*3/uL (ref 0.0–0.5)
Eosinophils Relative: 5 %
HCT: 33.7 % — ABNORMAL LOW (ref 36.0–46.0)
Hemoglobin: 9.6 g/dL — ABNORMAL LOW (ref 12.0–15.0)
Immature Granulocytes: 0 %
Lymphocytes Relative: 27 %
Lymphs Abs: 1.9 10*3/uL (ref 0.7–4.0)
MCH: 20.9 pg — ABNORMAL LOW (ref 26.0–34.0)
MCHC: 28.5 g/dL — ABNORMAL LOW (ref 30.0–36.0)
MCV: 73.4 fL — ABNORMAL LOW (ref 80.0–100.0)
Monocytes Absolute: 0.6 10*3/uL (ref 0.1–1.0)
Monocytes Relative: 8 %
Neutro Abs: 4 10*3/uL (ref 1.7–7.7)
Neutrophils Relative %: 59 %
Platelets: 250 10*3/uL (ref 150–400)
RBC: 4.59 MIL/uL (ref 3.87–5.11)
RDW: 21.5 % — ABNORMAL HIGH (ref 11.5–15.5)
Smear Review: NORMAL
WBC: 6.9 10*3/uL (ref 4.0–10.5)
nRBC: 0 % (ref 0.0–0.2)

## 2020-05-01 MED ORDER — TRELEGY ELLIPTA 200-62.5-25 MCG/INH IN AEPB: 1.0000 | INHALATION_SPRAY | Freq: Every day | RESPIRATORY_TRACT | 6 refills | Status: DC

## 2020-05-01 NOTE — Patient Instructions (Signed)
We are going to get blood tests to check for allergy.  I will let the determination of whether you need an echocardiogram or not to Dr. Saunders Revel.  We are going to get breathing tests scheduled.  We are switching your Advair to Trelegy Ellipta inhalation daily.  We will see you in follow-up in 2 months time call sooner should any new problems arise.

## 2020-05-01 NOTE — Progress Notes (Signed)
Subjective:    Patient ID: Vanessa Higgins, female    DOB: 18-Nov-1951, 69 y.o.   MRN: [id number]  HPI Vanessa Higgins is a 69 year old lifelong never smoker who presents for evaluation of poorly controlled asthma.  Patient is kindly referred by Mable Paris, [place].  Patient states that she has had asthma "all her life".  She states that she was diagnosed as early as age 69.  She did fairly well on this regard until her early 15s when she developed more difficulties with control of her asthma.  When Advair came into the marked the patient was placed on Advair and she states that for the longest time she did very well with this.  She also has been on montelukast.  Previously she had been on Advair 250/50 1 inhalation twice a day.  In the last 2 to 3 months however she has noted increasing cough and shortness of breath.  This shortness of breath is more marked during activity.  She notes that rescue inhaler also relieves her shortness of breath.  She previously had been evaluated by Dr. Raul Del but has not been to see Dr. Raul Del since 2017 she states that he "cut her loose".  She does note some seasonal variation to her symptoms with fall being the worst.  Recently her primary care provider increase her Advair to 500/50 which has helped her somewhat.  However she is concerned about the increased dose of steroid of the inhaler.  Patient does describe some paroxysmal nocturnal dyspnea as well as some orthopnea.  This is also a new symptom.  She also has noted palpitations.  The patient also notes that she has significant issues with gastroesophageal reflux however these are less noticeable when she takes her Prilosec.  She has noted some weight gain of late.  She does follow with Dr. Saunders Revel for issues with PVCs.  Echocardiogram was in 2017 showed an EF of 60 to 65%, normal wall motion.  No diastolic dysfunction and no evidence of pulmonary hypertension.  Past Medical History:  Diagnosis Date  . Allergy    hay fever    . Anemia   . Ankle fracture, left 04/08/2014  . Arthritis   . Asthma   . Blood transfusion abn reaction or complication, no procedure mishap   . Blood transfusion without reported diagnosis   . Depression   . Diabetes mellitus   . Disc displacement, lumbar   . Fatty liver   . GERD (gastroesophageal reflux disease)   . Headache(784.0)   . Hyperlipidemia   . Hypertension   . Migraine   . Obstructive sleep apnea    CPAP  . PONV (postoperative nausea and vomiting)   . Sleep apnea 04/06/2017   uses CPAP  . UTI (lower urinary tract infection)   . Wears glasses    Past Surgical History:  Procedure Laterality Date  . APPENDECTOMY  1977  . COLONOSCOPY  last 11/17/2014  . KNEE ARTHROPLASTY Right 03/03/2019   Procedure: COMPUTER ASSISTED TOTAL KNEE ARTHROPLASTY-RIGHT;  Surgeon: Dereck Leep, MD;  Location: [facility];  Service: Orthopedics;  Laterality: Right;  . LUMBAR LAMINECTOMY/DECOMPRESSION MICRODISCECTOMY Right 02/06/2018   Procedure: Lumbar two Hemilaminectomy for Discectomy;  Surgeon: Ashok Pall, MD;  Location: [place] [facility];  Service: Neurosurgery;  Laterality: Right;  . ORIF ANKLE FRACTURE Left 04/08/2014   Procedure: OPEN REDUCTION INTERNAL FIXATION (ORIF) ANKLE FRACTURE LEFT ANKLE FRACTURE OPEN TREATMENT BIMALLEOLAR ANKLE INCLUDES INTERNAL FIXATION;  Surgeon: Johnny Bridge, MD;  Location: [facility];  Service: Orthopedics;  Laterality: Left;  . PAROTIDECTOMY  1980   left  . POLYPECTOMY    . TONSILLECTOMY AND ADENOIDECTOMY     as a child  . 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 TUBAL LIGATION  05/1976   Family History  Problem Relation Age of Onset  . Sudden death Mother   . Cancer Mother 73       pancreatic  . Heart disease Father 36  . Sudden death Father   . Cancer Brother        colon  . Colon cancer Brother 6  . Stroke Maternal Grandmother   . Heart disease Maternal Grandfather   . Cancer Sister        brain  . Breast cancer Neg Hx   .  Colon polyps Neg Hx   . Esophageal cancer Neg Hx   . Rectal cancer Neg Hx   . Stomach cancer Neg Hx   . Thyroid cancer Neg Hx    Social history: As noted patient is a lifelong never smoker.  She owns 2 dogs, no exotic pets.  She has never been in the [employer].  She has lived in [place] in [place] all of her life.  No recent travel outside of the country.  No known exposure to tuberculosis.  No occupational exposures.  Last pulmonary function testing was performed in 2015.  Allergies  Allergen Reactions  . Sulfa Antibiotics Hives, Shortness Of Breath and Rash  . Ramipril Cough   Current Meds  Medication Sig  . albuterol (PROVENTIL HFA;VENTOLIN HFA) 108 (90 Base) MCG/ACT inhaler Inhale 2 puffs into the lungs every 6 (six) hours as needed for wheezing or shortness of breath.  Marland Kitchen aspirin 81 MG tablet Take 81 mg by mouth daily.  Marland Kitchen atorvastatin (LIPITOR) 20 MG tablet Take 1 tablet (20 mg total) by mouth daily.  Marland Kitchen b complex vitamins tablet Take 1 tablet by mouth daily.  . cholecalciferol (VITAMIN D3) 25 MCG (1000 UT) tablet Take 1,000 Units by mouth daily.  Marland Kitchen glucose blood (FREESTYLE LITE) test strip Use as instructed to check sugar up to 3 times daily  . insulin degludec (TRESIBA FLEXTOUCH) 100 UNIT/ML FlexTouch Pen Inject 0.2 mLs (20 Units total) into the skin at bedtime.  . Insulin Pen Needle (BD PEN NEEDLE NANO U/F) 32G X 4 MM MISC USE TWICE A DAY AS DIRECTED WITH DIABETES MEDICATION PENS  . JARDIANCE 25 MG TABS tablet TAKE 1 TABLET BY MOUTH ONCE DAILY  . loratadine (CLARITIN) 10 MG tablet Take 10 mg by mouth daily.  Marland Kitchen losartan (COZAAR) 25 MG tablet TAKE 1 TABLET BY MOUTH DAILY.  . meclizine (ANTIVERT) 25 MG tablet Take 1 tablet (25 mg total) by mouth 3 (three) times daily as needed for dizziness.  . metFORMIN (GLUCOPHAGE) 1000 MG tablet TAKE 1 TABLET BY MOUTH 2 TIMES DAILY WITH A MEAL.  . montelukast (SINGULAIR) 10 MG tablet TAKE 1 TABLET BY MOUTH AT BEDTIME  . omeprazole  (PRILOSEC) 20 MG capsule TAKE 1 CAPSULE BY MOUTH DAILY.  Marland Kitchen venlafaxine XR (EFFEXOR-XR) 150 MG 24 hr capsule TAKE 1 CAPSULE BY MOUTH DAILY WITH BREAKFAST.  Marland Kitchen Verapamil HCl CR 300 MG CP24 TAKE 1 CAPSULE BY MOUTH DAILY  . VICTOZA 18 MG/3ML SOPN INJECT 1.8 MG UNDER THE SKIN AT BEDTIME  . [DISCONTINUED] Fluticasone-Salmeterol (ADVAIR DISKUS) 500-50 MCG/DOSE AEPB Inhale 1 puff into the lungs every 12 (twelve) hours.    Review of Systems  A 10 point review of systems was performed and it is as noted above otherwise negative.    Objective:   Physical Exam BP 126/72 (BP Location: Left Arm, Cuff Size: Large)   Pulse 81   Temp (!) 97.3 F (36.3 C) (Temporal)   Ht 5\' 4"  (1.626 m)   Wt 210 lb (95.3 kg)   SpO2 95%   BMI 36.05 kg/m  GENERAL: This is a well-developed obese woman in no acute respiratory distress.  She is fully ambulatory.  No conversational dyspnea. HEAD: Normocephalic, atraumatic.  EYES: Pupils equal, round, reactive to light.  No scleral icterus.  MOUTH: Nose/mouth/throat not examined due to masking requirements for COVID 19. NECK: Supple. No thyromegaly. No nodules. No JVD.  Trachea midline.  No crepitus. PULMONARY: Symmetrical air entry.  No adventitious sounds. CARDIOVASCULAR: S1 and S2. Regular rate and rhythm.  No rubs murmurs gallops heard. GASTROINTESTINAL: Obese abdomen, soft. MUSCULOSKELETAL: No joint deformity, no clubbing, no edema.  NEUROLOGIC: No focal deficits noted.  Speech is fluent.  Awake, alert, oriented. SKIN: Intact,warm,dry, limited exam: No rashes. PSYCH: Normal mood and behavior.  We have reviewed laboratory data and this showed showed that she had a CBC with differential in October 2020 and this showed mild elevation of eosinophils.  Most recent chest x-ray was performed on 23 March 2020, independently reviewed, as below:     Film shows hyperinflation but no other abnormalities.  Assessment & Plan:   Moderate persistent asthma Poor  control Cough Dyspnea We will obtain pulmonary function testing CBC with differential and RAST panel Change Advair 500/50 to Trelegy Ellipta 200/6.25/25, 1 inhalation daily Continue use of albuterol as needed Follow-up in 2 months time she is to contact us prior to that time should any new difficulties arise  Obstructive sleep apnea on CPAP Continue follow-up with primary provider    Meds ordered this encounter  Medications  . Fluticasone-Umeclidin-Vilant (TRELEGY ELLIPTA) 200-62.5-25 MCG/INH AEPB    Sig: Inhale 1 puff into the lungs daily.    Dispense:  60 each    Refill:  6    C. Derrill Kay, MD [facility]   *This note was dictated using voice recognition software/Dragon.  Despite best efforts to proofread, errors can occur which can change the meaning.  Any change was purely unintentional.

## 2020-05-04 ENCOUNTER — Ambulatory Visit (INDEPENDENT_AMBULATORY_CARE_PROVIDER_SITE_OTHER): Payer: No Typology Code available for payment source | Admitting: Internal Medicine

## 2020-05-04 ENCOUNTER — Other Ambulatory Visit: Payer: Self-pay

## 2020-05-04 ENCOUNTER — Encounter: Payer: Self-pay | Admitting: Internal Medicine

## 2020-05-04 VITALS — BP 130/66 | HR 85 | Ht 64.0 in | Wt 212.2 lb

## 2020-05-04 DIAGNOSIS — E785 Hyperlipidemia, unspecified: Secondary | ICD-10-CM

## 2020-05-04 DIAGNOSIS — I10 Essential (primary) hypertension: Secondary | ICD-10-CM | POA: Diagnosis not present

## 2020-05-04 DIAGNOSIS — I493 Ventricular premature depolarization: Secondary | ICD-10-CM

## 2020-05-04 DIAGNOSIS — R0609 Other forms of dyspnea: Secondary | ICD-10-CM

## 2020-05-04 DIAGNOSIS — R0600 Dyspnea, unspecified: Secondary | ICD-10-CM

## 2020-05-04 DIAGNOSIS — R0602 Shortness of breath: Secondary | ICD-10-CM

## 2020-05-04 NOTE — Patient Instructions (Signed)
Medication Instructions:  Your physician recommends that you continue on your current medications as directed. Please refer to the Current Medication list given to you today.  *If you need a refill on your cardiac medications before your next appointment, please call your pharmacy*   Lab Work: none If you have labs (blood work) drawn today and your tests are completely normal, you will receive your results only by: Marland Kitchen MyChart Message (if you have MyChart) OR . A paper copy in the mail If you have any lab test that is abnormal or we need to change your treatment, we will call you to review the results.   Testing/Procedures: Your physician has requested that you have an echocardiogram. Echocardiography is a painless test that uses sound waves to create images of your heart. It provides your doctor with information about the size and shape of your heart and how well your heart's chambers and valves are working. This procedure takes approximately one hour. There are no restrictions for this procedure. You may get an IV, if needed, to receive an ultrasound enhancing agent through to better visualize your heart.   Follow-Up: At [facility], you and your health needs are our priority.  As part of our continuing mission to provide you with exceptional heart care, we have created designated Provider Care Teams.  These Care Teams include your primary Cardiologist (physician) and Advanced Practice Providers (APPs -  Physician Assistants and Nurse Practitioners) who all work together to provide you with the care you need, when you need it.  We recommend signing up for the patient portal called "MyChart".  Sign up information is provided on this After Visit Summary.  MyChart is used to connect with patients for Virtual Visits (Telemedicine).  Patients are able to view lab/test results, encounter notes, upcoming appointments, etc.  Non-urgent messages can be sent to your provider as well.   To learn more  about what you can do with MyChart, go to [email or web address].    Your next appointment:   3 month(s) with an APP  The format for your next appointment:   In Person  Provider:    You may see one of the following Advanced Practice Providers on your designated Care Team:    [name], NP  [name], PA-C  Marrianne Mood, PA-C    Echocardiogram An echocardiogram is a procedure that uses painless sound waves (ultrasound) to produce an image of the heart. Images from an echocardiogram can provide important information about:  Signs of coronary artery disease (CAD).  Aneurysm detection. An aneurysm is a weak or damaged part of an artery wall that bulges out from the normal force of blood pumping through the body.  Heart size and shape. Changes in the size or shape of the heart can be associated with certain conditions, including heart failure, aneurysm, and CAD.  Heart muscle function.  Heart valve function.  Signs of a past heart attack.  Fluid buildup around the heart.  Thickening of the heart muscle.  A tumor or infectious growth around the heart valves. Tell a health care provider about:  Any allergies you have.  All medicines you are taking, including vitamins, herbs, eye drops, creams, and over-the-counter medicines.  Any blood disorders you have.  Any surgeries you have had.  Any medical conditions you have.  Whether you are pregnant or may be pregnant. What are the risks? Generally, this is a safe procedure. However, problems may occur, including:  Allergic reaction to dye (contrast) that  may be used during the procedure. What happens before the procedure? No specific preparation is needed. You may eat and drink normally. What happens during the procedure?   An IV tube may be inserted into one of your veins.  You may receive contrast through this tube. A contrast is an injection that improves the quality of the pictures from your  heart.  A gel will be applied to your chest.  A wand-like tool (transducer) will be moved over your chest. The gel will help to transmit the sound waves from the transducer.  The sound waves will harmlessly bounce off of your heart to allow the heart images to be captured in real-time motion. The images will be recorded on a computer. The procedure may vary among health care providers and hospitals. What happens after the procedure?  You may return to your normal, everyday life, including diet, activities, and medicines, unless your health care provider tells you not to do that. Summary  An echocardiogram is a procedure that uses painless sound waves (ultrasound) to produce an image of the heart.  Images from an echocardiogram can provide important information about the size and shape of your heart, heart muscle function, heart valve function, and fluid buildup around your heart.  You do not need to do anything to prepare before this procedure. You may eat and drink normally.  After the echocardiogram is completed, you may return to your normal, everyday life, unless your health care provider tells you not to do that. This information is not intended to replace advice given to you by your health care provider. Make sure you discuss any questions you have with your health care provider. Document Revised: 04/08/2019 Document Reviewed: 01/18/2017 Elsevier Patient Education  [place].

## 2020-05-04 NOTE — Progress Notes (Signed)
Follow-up Outpatient Visit Date: 05/04/2020  Primary Care Provider: Burnard Hawthorne, FNP [address] [place] [address]  Chief Complaint: Shortness of breath  HPI:  Vanessa Higgins is a 69 y.o. female with history of frequent PVCs, hypertension, hyperlipidemia, type 2 diabetes mellitus, asthma, obstructive sleep apnea, and GERD, who presents for follow-up of palpitations/PVCs.  We last spoke via virtual visit a year ago, at which time Vanessa Higgins was feeling well.  She noted only rare palpitations without associated symptoms.  We did not make any medication changes or pursue additional testing at that time.  Today, Vanessa Higgins notes that she has experienced increasing shortness of breath over the last few months.  This initially began with a cough and chest congestion around Christmas.  She then began to experience worsening exertional dyspnea over the last month.  She has not had any dyspnea at rest nor orthopnea.  She also denies chest pain and edema.  She has woken up occasionally at night feeling short of breath and with her heart racing despite using her CPAP.  She has not had any palpitations or lightheadedness during the day.  She notes that her blood pressure is typically less than 130/80.  Vanessa Higgins was recently seen by Dr. Patsey Berthold in the pulmonary clinic, with changes made to her inhalers (she has yet to begin the new medication).  She is also scheduled for PFTs.  --------------------------------------------------------------------------------------------------  Cardiovascular History & Procedures: Cardiovascular Problems:  Frequent PVCs  Shortness of breath  Risk Factors:  Hypertension, hyperlipidemia, diabetes mellitus, obesity, and age greater than 78  Cath/PCI:  None  CV Surgery:  None  EP Procedures and Devices:  Holter monitor (09/19/16): Predominantly sinus rhythm with rare PACs and frequent PVCs (11% burden). Single 5 beat atrial run.   Non-Invasive Evaluation(s):  TTE (09/19/16): Normal LV size with an EF of 60-65%. Normal wall motion and diastolic function. Normal RV size and function. No significant valvular abnormalities.  Pharmacologic MPI (09/06/16): Low risk study without ischemia. LVEF 55%.  Recent CV Pertinent Labs: Lab Results  Component Value Date   CHOL 180 03/22/2020   CHOL 102 06/28/2013   HDL 39.70 03/22/2020   HDL 44 06/28/2013   LDLCALC 119 (H) 03/22/2020   LDLCALC 42 06/28/2013   TRIG 109.0 03/22/2020   TRIG 81 06/28/2013   CHOLHDL 5 03/22/2020   INR 1.07 02/18/2019   K 4.1 03/22/2020   K 4.2 12/28/2012   BUN 13 03/22/2020   BUN 16 12/28/2012   CREATININE 0.52 03/22/2020   CREATININE 0.57 12/02/2014    Past medical and surgical history were reviewed and updated in EPIC.  Current Meds  Medication Sig  . albuterol (PROVENTIL HFA;VENTOLIN HFA) 108 (90 Base) MCG/ACT inhaler Inhale 2 puffs into the lungs every 6 (six) hours as needed for wheezing or shortness of breath.  Marland Kitchen aspirin 81 MG tablet Take 81 mg by mouth daily.  Marland Kitchen atorvastatin (LIPITOR) 20 MG tablet Take 1 tablet (20 mg total) by mouth daily.  Marland Kitchen b complex vitamins tablet Take 1 tablet by mouth daily.  . cholecalciferol (VITAMIN D3) 25 MCG (1000 UT) tablet Take 1,000 Units by mouth daily.  . Fluticasone-Umeclidin-Vilant (TRELEGY ELLIPTA) 200-62.5-25 MCG/INH AEPB Inhale 1 puff into the lungs daily.  Marland Kitchen glucose blood (FREESTYLE LITE) test strip Use as instructed to check sugar up to 3 times daily  . insulin degludec (TRESIBA FLEXTOUCH) 100 UNIT/ML FlexTouch Pen Inject 0.2 mLs (20 Units total) into the skin at  bedtime.  . Insulin Pen Needle (BD PEN NEEDLE NANO U/F) 32G X 4 MM MISC USE TWICE A DAY AS DIRECTED WITH DIABETES MEDICATION PENS  . JARDIANCE 25 MG TABS tablet TAKE 1 TABLET BY MOUTH ONCE DAILY  . loratadine (CLARITIN) 10 MG tablet Take 10 mg by mouth daily.  Marland Kitchen losartan (COZAAR) 25 MG tablet TAKE 1 TABLET BY MOUTH DAILY.  . meclizine  (ANTIVERT) 25 MG tablet Take 1 tablet (25 mg total) by mouth 3 (three) times daily as needed for dizziness.  . metFORMIN (GLUCOPHAGE) 1000 MG tablet TAKE 1 TABLET BY MOUTH 2 TIMES DAILY WITH A MEAL.  . montelukast (SINGULAIR) 10 MG tablet TAKE 1 TABLET BY MOUTH AT BEDTIME  . Multiple Vitamins-Minerals (CENTRUM SILVER PO) Take 1 tablet by mouth daily.   Marland Kitchen omeprazole (PRILOSEC) 20 MG capsule TAKE 1 CAPSULE BY MOUTH DAILY.  Marland Kitchen venlafaxine XR (EFFEXOR-XR) 150 MG 24 hr capsule TAKE 1 CAPSULE BY MOUTH DAILY WITH BREAKFAST.  Marland Kitchen Verapamil HCl CR 300 MG CP24 TAKE 1 CAPSULE BY MOUTH DAILY  . VICTOZA 18 MG/3ML SOPN INJECT 1.8 MG UNDER THE SKIN AT BEDTIME    Allergies: Sulfa antibiotics and Ramipril  Social History   Tobacco Use  . Smoking status: Never Smoker  . Smokeless tobacco: Never Used  Substance Use Topics  . Alcohol use: No  . Drug use: No    Family History  Problem Relation Age of Onset  . Sudden death Mother   . Cancer Mother 57       pancreatic  . Heart disease Father 20  . Sudden death Father   . Cancer Brother        colon  . Colon cancer Brother 7  . Stroke Maternal Grandmother   . Heart disease Maternal Grandfather   . Cancer Sister        brain  . Breast cancer Neg Hx   . Colon polyps Neg Hx   . Esophageal cancer Neg Hx   . Rectal cancer Neg Hx   . Stomach cancer Neg Hx   . Thyroid cancer Neg Hx     Review of Systems: A 12-system review of systems was performed and was negative except as noted in the HPI.  --------------------------------------------------------------------------------------------------  Physical Exam: BP 130/66 (BP Location: Left Arm, Patient Position: Sitting, Cuff Size: Large)   Pulse 85   Ht 5\' 4"  (1.626 m)   Wt 212 lb 4 oz (96.3 kg)   SpO2 92%   BMI 36.43 kg/m     General: NAD. Neck: No JVD or HJR, though evaluation is limited by body habitus. Lungs: Mildly diminished breath sounds throughout without wheezes or crackles. Heart:  Distant heart sounds with regular rate and rhythm.  No murmurs, rubs, or gallops. Abdomen: Soft, nontender, nondistended. Extremities: No lower extremity edema.  EKG: Normal sinus rhythm with low voltage.  Otherwise, no significant abnormality or change from prior tracing on 02/18/2019.  Lab Results  Component Value Date   WBC 6.9 05/01/2020   HGB 9.6 (L) 05/01/2020   HCT 33.7 (L) 05/01/2020   MCV 73.4 (L) 05/01/2020   PLT 250 05/01/2020    Lab Results  Component Value Date   NA 138 03/22/2020   K 4.1 03/22/2020   CL 103 03/22/2020   CO2 28 03/22/2020   BUN 13 03/22/2020   CREATININE 0.52 03/22/2020   GLUCOSE 91 03/22/2020   ALT 31 03/22/2020    Lab Results  Component Value Date   CHOL 180  03/22/2020   HDL 39.70 03/22/2020   LDLCALC 119 (H) 03/22/2020   TRIG 109.0 03/22/2020   CHOLHDL 5 03/22/2020    --------------------------------------------------------------------------------------------------  ASSESSMENT AND PLAN: Dyspnea on exertion: Given that symptoms began with cough and chest congestion and or not associated with edema orthopnea, I suspect that underlying pulmonary process is driving dyspnea.  However, given history of frequent PVCs, it is conceivable that Vanessa Higgins could have developed cardiomyopathy leading to her symptoms.  I have recommended that we obtain a transthoracic echocardiogram for further assessment.  I will defer cardiac medication changes today.  I encouraged Vanessa Higgins to proceed with adjustments to her inhalers as well as obtaining PFTs, as recommended by Dr. Patsey Berthold.  Frequent PVCs: Other than brief racing of her heart when she awakens abruptly at night, Vanessa Higgins denies palpitations.  Her EKG today shows sinus rhythm without ectopy.  We will plan to continue verapamil, though if cardiomyopathy is identified, we may need to consider transitioning to a beta-blocker.  Hypertension: Blood pressure borderline today but typically better at home.   We will defer medication changes at this time.  Hyperlipidemia: LDL mildly elevated at 109 on most recent labs (goal less than 100, ideally below 70, given history of diabetes mellitus).  Escalation of atorvastatin should be considered; I will defer this to Vanessa Higgins and readdress at our follow-up visit.  Follow-up: Return to clinic in 3 months.  [name], MD 05/04/2020 3:55 PM

## 2020-05-05 ENCOUNTER — Encounter: Payer: Self-pay | Admitting: Internal Medicine

## 2020-05-05 DIAGNOSIS — R0602 Shortness of breath: Secondary | ICD-10-CM | POA: Insufficient documentation

## 2020-05-08 ENCOUNTER — Other Ambulatory Visit (INDEPENDENT_AMBULATORY_CARE_PROVIDER_SITE_OTHER): Payer: No Typology Code available for payment source

## 2020-05-08 ENCOUNTER — Other Ambulatory Visit: Payer: Self-pay

## 2020-05-08 DIAGNOSIS — R899 Unspecified abnormal finding in specimens from other organs, systems and tissues: Secondary | ICD-10-CM | POA: Diagnosis not present

## 2020-05-08 LAB — COMPREHENSIVE METABOLIC PANEL
ALT: 21 U/L (ref 0–35)
AST: 24 U/L (ref 0–37)
Albumin: 4.2 g/dL (ref 3.5–5.2)
Alkaline Phosphatase: 44 U/L (ref 39–117)
BUN: 11 mg/dL (ref 6–23)
CO2: 29 mEq/L (ref 19–32)
Calcium: 9.2 mg/dL (ref 8.4–10.5)
Chloride: 100 mEq/L (ref 96–112)
Creatinine, Ser: 0.64 mg/dL (ref 0.40–1.20)
GFR: 91.54 mL/min (ref >60.00)
Glucose, Bld: 122 mg/dL — ABNORMAL HIGH (ref 70–99)
Potassium: 4.4 mEq/L (ref 3.5–5.1)
Sodium: 136 mEq/L (ref 135–145)
Total Bilirubin: 0.8 mg/dL (ref 0.2–1.2)
Total Protein: 7.5 g/dL (ref 6.0–8.3)

## 2020-05-09 ENCOUNTER — Other Ambulatory Visit
Admission: RE | Admit: 2020-05-09 | Discharge: 2020-05-09 | Disposition: A | Discharge status: Home / Self Care | Payer: No Typology Code available for payment source | Source: Ambulatory Visit | Attending: Pulmonary Disease | Admitting: Pulmonary Disease

## 2020-05-09 DIAGNOSIS — Z01812 Encounter for preprocedural laboratory examination: Secondary | ICD-10-CM | POA: Insufficient documentation

## 2020-05-09 DIAGNOSIS — Z20822 Contact with and (suspected) exposure to covid-19: Secondary | ICD-10-CM | POA: Insufficient documentation

## 2020-05-09 LAB — SARS CORONAVIRUS 2 (TAT 6-24 HRS): SARS Coronavirus 2: NEGATIVE

## 2020-05-10 ENCOUNTER — Other Ambulatory Visit: Payer: Self-pay

## 2020-05-10 ENCOUNTER — Ambulatory Visit: Payer: No Typology Code available for payment source | Attending: Pulmonary Disease

## 2020-05-10 ENCOUNTER — Ambulatory Visit (INDEPENDENT_AMBULATORY_CARE_PROVIDER_SITE_OTHER): Payer: No Typology Code available for payment source | Admitting: Bariatrics

## 2020-05-10 ENCOUNTER — Other Ambulatory Visit (INDEPENDENT_AMBULATORY_CARE_PROVIDER_SITE_OTHER): Payer: Self-pay | Admitting: Bariatrics

## 2020-05-10 ENCOUNTER — Encounter (INDEPENDENT_AMBULATORY_CARE_PROVIDER_SITE_OTHER): Payer: Self-pay | Admitting: Bariatrics

## 2020-05-10 VITALS — BP 155/74 | HR 77 | Temp 98.4°F | Ht 63.0 in | Wt 205.0 lb

## 2020-05-10 DIAGNOSIS — J4540 Moderate persistent asthma, uncomplicated: Secondary | ICD-10-CM | POA: Diagnosis not present

## 2020-05-10 DIAGNOSIS — K760 Fatty (change of) liver, not elsewhere classified: Secondary | ICD-10-CM

## 2020-05-10 DIAGNOSIS — D508 Other iron deficiency anemias: Secondary | ICD-10-CM

## 2020-05-10 DIAGNOSIS — I152 Hypertension secondary to endocrine disorders: Secondary | ICD-10-CM

## 2020-05-10 DIAGNOSIS — Z1331 Encounter for screening for depression: Secondary | ICD-10-CM

## 2020-05-10 DIAGNOSIS — E1159 Type 2 diabetes mellitus with other circulatory complications: Secondary | ICD-10-CM | POA: Diagnosis not present

## 2020-05-10 DIAGNOSIS — R5383 Other fatigue: Secondary | ICD-10-CM

## 2020-05-10 DIAGNOSIS — E559 Vitamin D deficiency, unspecified: Secondary | ICD-10-CM

## 2020-05-10 DIAGNOSIS — E1169 Type 2 diabetes mellitus with other specified complication: Secondary | ICD-10-CM

## 2020-05-10 DIAGNOSIS — Z6836 Body mass index (BMI) 36.0-36.9, adult: Secondary | ICD-10-CM

## 2020-05-10 DIAGNOSIS — I10 Essential (primary) hypertension: Secondary | ICD-10-CM

## 2020-05-10 DIAGNOSIS — Z0289 Encounter for other administrative examinations: Secondary | ICD-10-CM

## 2020-05-10 DIAGNOSIS — Z9189 Other specified personal risk factors, not elsewhere classified: Secondary | ICD-10-CM

## 2020-05-10 DIAGNOSIS — R0602 Shortness of breath: Secondary | ICD-10-CM

## 2020-05-10 DIAGNOSIS — E785 Hyperlipidemia, unspecified: Secondary | ICD-10-CM

## 2020-05-10 DIAGNOSIS — E538 Deficiency of other specified B group vitamins: Secondary | ICD-10-CM

## 2020-05-10 DIAGNOSIS — G4733 Obstructive sleep apnea (adult) (pediatric): Secondary | ICD-10-CM | POA: Diagnosis not present

## 2020-05-10 DIAGNOSIS — E669 Obesity, unspecified: Secondary | ICD-10-CM

## 2020-05-10 NOTE — Progress Notes (Signed)
Dear Vanessa Paris, FNP,   Thank you for referring Vanessa Higgins to our clinic. The following note includes my evaluation and treatment recommendations.  Chief Complaint:   OBESITY Vanessa Higgins (MR# [id number]) is a 69 y.o. female who presents for evaluation and treatment of obesity and related comorbidities. Current BMI is Body mass index is 36.31 kg/m.Marland Kitchen Vanessa Higgins has been struggling with her weight for many years and has been unsuccessful in either losing weight, maintaining weight loss, or reaching her healthy weight goal.  Vanessa Higgins is currently in the action stage of change and ready to dedicate time achieving and maintaining a healthier weight. Vanessa Higgins is interested in becoming our patient and working on intensive lifestyle modifications including (but not limited to) diet and exercise for weight loss.  Vanessa Higgins likes potatoes and sweets. She struggles with portion size at times. She goes out to eat 4 times a week.  Vanessa Higgins habits were reviewed today and are as follows: Her family eats meals together, she thinks her family will eat healthier with her, her desired weight loss is 30 lbs, she has been heavy most of her life, she started gaining weight when she got married and went to work, her heaviest weight ever was 265 pounds, she sometimes craves sweets; sometimes craves salty, she snacks frequently in the evenings, she frequently makes poor food choices, she has problems with excessive hunger and she struggles with emotional eating.  Depression Screen Vanessa Higgins Food and Mood (modified PHQ-9) score was 11.  Depression screen PHQ 2/9 05/10/2020  Decreased Interest 2  Down, Depressed, Hopeless 2  PHQ - 2 Score 4  Altered sleeping 0  Tired, decreased energy 3  Change in appetite 2  Feeling bad or failure about yourself  0  Trouble concentrating 2  Moving slowly or fidgety/restless 0  Suicidal thoughts 0  PHQ-9 Score 11  Difficult doing work/chores Not difficult at all   Subjective:    Other fatigue. Vanessa Higgins denies daytime somnolence and denies waking up still tired. Patent has a history of symptoms of Epworth sleepiness scale. Vanessa Higgins generally gets 6 hours of sleep per night, and states that she has generally restful sleep. Apneic episodes are present. Epworth Sleepiness Score is 16.  SOB (shortness of breath) on exertion. Vanessa Higgins notes increasing shortness of breath with certain activities and seems to be worsening over time with weight gain. She notes getting out of breath sooner with activity than she used to. This has gotten worse recently. Vanessa Higgins denies shortness of breath at rest or orthopnea.  Hypertension associated with diabetes (Vanessa Higgins). Blood pressure is reasonably well controlled; is slightly elevated today at 155/74.  BP Readings from Last 3 Encounters:  05/10/20 (!) 155/74  05/04/20 130/66  05/01/20 126/72   Lab Results  Component Value Date   CREATININE 0.64 05/08/2020   CREATININE 0.52 03/22/2020   CREATININE 0.51 10/12/2019   OSA (obstructive sleep apnea). Vanessa Higgins wears CPAP and reports restorative sleep.  Fatty liver disease, nonalcoholic. Vanessa Higgins denies abdominal pain.  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 Fasting blood sugars are in the 140's with 2-hour postprandials ranging in the 130's to 140's. Last A1c 6.9 on 03/22/2020. Vanessa Higgins is taking [place] 20 units at HS, Jardiance, and metformin.   Lab Results  Component Value Date   HGBA1C 6.9 (H) 03/22/2020   HGBA1C 7.1 (H) 10/12/2019   HGBA1C 7.3 (H) 06/10/2019   Lab Results  Component Value Date   MICROALBUR 1.6 03/22/2020   LDLCALC 119 (H) 03/22/2020  CREATININE 0.64 05/08/2020   No results found for: INSULIN  Hyperlipidemia associated with type 2 diabetes mellitus (Vanessa Higgins). Vanessa Higgins is taking Lipitor.   Lab Results  Component Value Date   CHOL 180 03/22/2020   HDL 39.70 03/22/2020   LDLCALC 119 (H) 03/22/2020   TRIG 109.0 03/22/2020   CHOLHDL 5 03/22/2020   Lab Results  Component Value  Date   ALT 21 05/08/2020   AST 24 05/08/2020   ALKPHOS 44 05/08/2020   BILITOT 0.8 05/08/2020   The 10-year ASCVD risk score Vanessa Higgins DC Jr., et al., 2013) is: 32.7%   Values used to calculate the score:     Age: 2 years     Sex: Female     Is Non-Hispanic African American: No     Diabetic: Yes     Tobacco smoker: No     Systolic Blood Pressure: 99991111 mmHg     Is BP treated: Yes     HDL Cholesterol: 39.7 mg/dL     Total Cholesterol: 180 mg/dL  Other iron deficiency anemia. Vanessa Higgins had a colonoscopy in December 2020, which was benign. Hemoglobin 9.6 on 05/01/2020 with a hematocrit of 33.7.   CBC Latest Ref Rng & Units 05/01/2020 10/12/2019 02/18/2019  WBC 4.0 - 10.5 K/uL 6.9 5.9 6.7  Hemoglobin 12.0 - 15.0 g/dL 9.6(L) 10.3 Repeated and verified X2.(L) 10.5(L)  Hematocrit 36.0 - 46.0 % 33.7(L) 33.8(L) 35.3(L)  Platelets 150 - 400 K/uL 250 462.0(H) 365   Lab Results  Component Value Date   IRON 62 10/02/2016   TIBC 426 10/02/2016   FERRITIN 12 (L) 10/02/2016   Lab Results  Component Value Date   VITAMINB12 176 (L) 10/12/2019   Vitamin D deficiency. No nausea, vomiting, or muscle weakness.   B12 deficiency. Vanessa Higgins is taking OTC Vitamin B12 supplementation.  Depression screening. Vanessa Higgins had a moderately positive depression screen with a PHQ-9 score of 11.  At risk for hypoglycemia. Vanessa Higgins is at increased risk for hypoglycemia secondary to diabetes mellitus type II and obesity.  Assessment/Plan:   Other fatigue. Vanessa Higgins does feel that her weight is causing her energy to be lower than it should be. Fatigue may be related to obesity, depression or many other causes. Labs will be ordered, and in the meanwhile, Vanessa Higgins will focus on self care including making healthy food choices, increasing physical activity and focusing on stress reduction. T3, T4, TSH ordered today.  SOB (shortness of breath) on exertion. Vanessa Higgins does feel that she gets out of breath more easily that she used to when she  exercises. Vanessa Higgins's shortness of breath appears to be obesity related and exercise induced. She has agreed to work on weight loss and gradually increase exercise to treat her exercise induced shortness of breath. Will continue to monitor closely.    Hypertension associated with diabetes ([place]). Emmanuella is working on healthy weight loss and exercise to improve blood pressure control. We will watch for signs of hypotension as she continues her lifestyle modifications. She will continue her medication as directed.  OSA (obstructive sleep apnea). Intensive lifestyle modifications are the first line treatment for this issue. We discussed several lifestyle modifications today and she will continue to work on diet, exercise and weight loss efforts. We will continue to monitor. Orders and follow up as documented in patient record. Ragna will wear CPAP nightly.  Counseling  Sleep apnea is a condition in which breathing pauses or becomes shallow during sleep. This happens over and over during the night. This disrupts  your sleep and keeps your body from getting the rest that it needs, which can cause tiredness and lack of energy (fatigue) during the day.  Sleep apnea treatment: If you were given a device to open your airway while you sleep, USE IT!  Sleep hygiene:   Limit or avoid alcohol, caffeinated beverages, and cigarettes, especially close to bedtime.   Do not eat a large meal or eat spicy foods right before bedtime. This can lead to digestive discomfort that can make it hard for you to sleep.  Keep a sleep diary to help you and your health care provider figure out what could be causing your insomnia.  . Make your bedroom a dark, comfortable place where it is easy to fall asleep. ? Put up shades or blackout curtains to block light from outside. ? Use a white noise machine to block noise. ? Keep the temperature cool. . Limit screen use before bedtime. This includes: ? Watching TV. ? Using your  smartphone, tablet, or computer. . Stick to a routine that includes going to bed and waking up at the same times every day and night. This can help you fall asleep faster. Consider making a quiet activity, such as reading, part of your nighttime routine. . Try to avoid taking naps during the day so that you sleep better at night. . Get out of bed if you are still awake after 15 minutes of trying to sleep. Keep the lights down, but try reading or doing a quiet activity. When you feel sleepy, go back to bed.  Fatty liver disease, nonalcoholic. Vanessa Higgins will follow with her PCP and GI. She will work on weight loss and exercise.  Type 2 diabetes mellitus with obesity ([place]). Good blood sugar control is important to decrease the likelihood of diabetic complications such as nephropathy, neuropathy, limb loss, blindness, coronary artery disease, and death. Intensive lifestyle modification including diet, exercise and weight loss are the first line of treatment for diabetes. Vanessa Higgins will continue metformin and Jardiance. She will decrease [place] to 12 units at HS. She will do fasting blood sugars and 2-hour postprandials and when if symptomatic.  Hyperlipidemia associated with type 2 diabetes mellitus ([place]). Cardiovascular risk and specific lipid/LDL goals reviewed.  We discussed several lifestyle modifications today and Vanessa Higgins will continue to work on diet, exercise and weight loss efforts. Orders and follow up as documented in patient record. Vanessa Higgins will continue Lipitor as directed.  Counseling Intensive lifestyle modifications are the first line treatment for this issue. . Dietary changes: Increase soluble fiber. Decrease simple carbohydrates. . Exercise changes: Moderate to vigorous-intensity aerobic activity 150 minutes per week if tolerated. . Lipid-lowering medications: see documented in medical record.  Other iron deficiency anemia. Orders and follow up as documented in patient record. Vanessa Higgins will  follow-up with her PCP.  Counseling . Iron is essential for our bodies to make red blood cells.  Reasons that someone may be deficient include: an iron-deficient diet (more likely in those following vegan or vegetarian diets), women with heavy menses, patients with GI disorders or poor absorption, patients that have had bariatric surgery, frequent blood donors, patients with cancer, and patients with heart disease.   Marland Kitchen An iron supplement has been recommended. This is found over-the-counter.  Vanessa Higgins Noble foods include dark leafy greens, red and white meats, eggs, seafood, and beans.   . Certain foods and drinks prevent your body from absorbing iron properly. Avoid eating these foods in the same meal as iron-rich foods or with  iron supplements. These foods include: coffee, black tea, and red wine; milk, dairy products, and foods that are high in calcium; beans and soybeans; whole grains.  . Constipation can be a side effect of iron supplementation. Increased water and fiber intake are helpful. Water goal: > 2 liters/day. Fiber goal: > 25 grams/day.  Vitamin D deficiency. Low Vitamin D level contributes to fatigue and are associated with obesity, breast, and colon cancer. VITAMIN D 25 Hydroxy (Vit-D Deficiency, Fractures) level ordered today.  B12 deficiency. The diagnosis was reviewed with the patient. Counseling provided today, see below. We will continue to monitor. Orders and follow up as documented in patient record. Vitamin B12 level ordered today.  Counseling . The body needs vitamin B12: to make red blood cells; to make DNA; and to help the nerves work properly so they can carry messages from the brain to the body.  . The main causes of vitamin B12 deficiency include dietary deficiency, digestive diseases, pernicious anemia, and having a surgery in which part of the stomach or small intestine is removed.  . Certain medicines can make it harder for the body to absorb vitamin B12. These medicines  include: heartburn medications; some antibiotics; some medications used to treat diabetes, gout, and high cholesterol.  . In some cases, there are no symptoms of this condition. If the condition leads to anemia or nerve damage, various symptoms can occur, such as weakness or fatigue, shortness of breath, and numbness or tingling in your hands and feet.   . Treatment:  o May include taking vitamin B12 supplements.  o Avoid alcohol.  o Eat lots of healthy foods that contain vitamin B12: - Beef, pork, chicken, [place], and organ meats, such as liver.  - Seafood: This includes clams, rainbow trout, salmon, tuna, and haddock.  - Eggs.  - Cereal and dairy products that are fortified: This means that vitamin B12 has been added to the food.      Depression screening. Zayah had a positive depression screening. Depression is commonly associated with obesity and often results in emotional eating behaviors. We will monitor this closely and work on CBT to help improve the non-hunger eating patterns. Referral to Psychology may be required if no improvement is seen as she continues in our clinic.  At risk for hypoglycemia. Evola was given approximately 15 minutes of counseling today regarding prevention of hypoglycemia. She was advised of symptoms of hypoglycemia. Phallon was instructed to avoid skipping meals, eat regular protein rich meals and schedule low calorie snacks as needed.   Repetitive spaced learning was employed today to elicit superior memory formation and behavioral change.  Class 2 severe obesity with serious comorbidity and body mass index (BMI) of 36.0 to 36.9 in adult, unspecified obesity type ([place]).  Sosha is currently in the action stage of change and her goal is to continue with weight loss efforts. I recommend Janelly begin the structured treatment plan as follows:  She has agreed to the Category 2 Plan + 100 calories.  She will work on meal planning.  We reviewed labs from 05/01/2020 and  05/08/2020 including CMP and CBC and labs from 03/22/2020 including A1c, glucose, and microalbumin.  Exercise goals: Older adults should follow the adult guidelines. When older adults cannot meet the adult guidelines, they should be as physically active as their abilities and conditions will allow.    Behavioral modification strategies: increasing lean protein intake, decreasing simple carbohydrates, increasing vegetables, increasing water intake, decreasing eating out, no skipping meals, meal  planning and cooking strategies, keeping healthy foods in the home and planning for success.  She was informed of the importance of frequent follow-up visits to maximize her success with intensive lifestyle modifications for her multiple health conditions. She was informed we would discuss her lab results at her next visit unless there is a critical issue that needs to be addressed sooner. Davia agreed to keep her next visit at the agreed upon time to discuss these results.  Objective:   Blood pressure (!) 155/74, pulse 77, temperature 98.4 F (36.9 C), height 5\' 3"  (1.6 m), weight 205 lb (93 kg), SpO2 94 %. Body mass index is 36.31 kg/m.  EKG: Not performed.  Indirect Calorimeter completed today shows a VO2 of 259 and a REE of 1803.  Her calculated basal metabolic rate is [id number] thus her basal metabolic rate is better than expected.  General: Cooperative, alert, well developed, in no acute distress. HEENT: Conjunctivae and lids unremarkable. Cardiovascular: Regular rhythm.  Lungs: Normal work of breathing. Neurologic: No focal deficits.   Lab Results  Component Value Date   CREATININE 0.64 05/08/2020   BUN 11 05/08/2020   NA 136 05/08/2020   K 4.4 05/08/2020   CL 100 05/08/2020   CO2 29 05/08/2020   Lab Results  Component Value Date   ALT 21 05/08/2020   AST 24 05/08/2020   ALKPHOS 44 05/08/2020   BILITOT 0.8 05/08/2020   Lab Results  Component Value Date   HGBA1C 6.9 (H) 03/22/2020    HGBA1C 7.1 (H) 10/12/2019   HGBA1C 7.3 (H) 06/10/2019   HGBA1C 6.8 (H) 02/18/2019   HGBA1C 7.2 (H) 11/18/2018   No results found for: INSULIN Lab Results  Component Value Date   TSH 0.87 10/12/2019   Lab Results  Component Value Date   CHOL 180 03/22/2020   HDL 39.70 03/22/2020   LDLCALC 119 (H) 03/22/2020   TRIG 109.0 03/22/2020   CHOLHDL 5 03/22/2020   Lab Results  Component Value Date   WBC 6.9 05/01/2020   HGB 9.6 (L) 05/01/2020   HCT 33.7 (L) 05/01/2020   MCV 73.4 (L) 05/01/2020   PLT 250 05/01/2020   Lab Results  Component Value Date   IRON 62 10/02/2016   TIBC 426 10/02/2016   FERRITIN 12 (L) 10/02/2016   Attestation Statements:   Reviewed by clinician on day of visit: allergies, medications, problem list, medical history, surgical history, family history, social history, and previous encounter notes.  Migdalia Dk, am acting as Location manager for [employer], DO   I have reviewed the above documentation for accuracy and completeness, and I agree with the above. Jearld Lesch, DO

## 2020-05-11 LAB — ALLERGEN PANEL (27) + IGE
Alternaria Alternata IgE: <0.1 kU/L
Aspergillus Fumigatus IgE: 0.1 kU/L — AB
Bahia Grass IgE: 0.14 kU/L — AB
Bermuda Grass IgE: <0.1 kU/L
Cat Dander IgE: <0.1 kU/L
Cedar, Mountain IgE: <0.1 kU/L
Cladosporium Herbarum IgE: <0.1 kU/L
Cocklebur IgE: <0.1 kU/L
Cockroach, American IgE: <0.1 kU/L
Common Silver Birch IgE: <0.1 kU/L
D Farinae IgE: 0.15 kU/L — AB
D Pteronyssinus IgE: 0.19 kU/L — AB
Dog Dander IgE: <0.1 kU/L
Elm, American IgE: <0.1 kU/L
Hickory, White IgE: <0.1 kU/L
IgE (Immunoglobulin E), Serum: 61 IU/mL (ref 6–495)
Johnson Grass IgE: <0.1 kU/L
Kentucky Bluegrass IgE: 0.29 kU/L — AB
Maple/Box Elder IgE: <0.1 kU/L
Mucor Racemosus IgE: <0.1 kU/L
Oak, White IgE: <0.1 kU/L
Penicillium Chrysogen IgE: <0.1 kU/L
Pigweed, Rough IgE: <0.1 kU/L
Plantain, English IgE: <0.1 kU/L
Ragweed, Short IgE: <0.1 kU/L
Setomelanomma Rostrat: <0.1 kU/L
Timothy Grass IgE: 0.21 kU/L — AB
White Mulberry IgE: <0.1 kU/L

## 2020-05-11 LAB — TSH: TSH: 1.29 u[IU]/mL (ref 0.450–4.500)

## 2020-05-11 LAB — VITAMIN B12: Vitamin B-12: 830 pg/mL (ref 232–1245)

## 2020-05-11 LAB — T4: T4, Total: 7.8 ug/dL (ref 4.5–12.0)

## 2020-05-11 LAB — VITAMIN D 25 HYDROXY (VIT D DEFICIENCY, FRACTURES): Vit D, 25-Hydroxy: 36.9 ng/mL (ref 30.0–100.0)

## 2020-05-11 LAB — T3: T3, Total: 158 ng/dL (ref 71–180)

## 2020-05-24 ENCOUNTER — Ambulatory Visit (INDEPENDENT_AMBULATORY_CARE_PROVIDER_SITE_OTHER): Payer: No Typology Code available for payment source | Admitting: Bariatrics

## 2020-05-24 ENCOUNTER — Other Ambulatory Visit: Payer: Self-pay

## 2020-05-24 ENCOUNTER — Encounter (INDEPENDENT_AMBULATORY_CARE_PROVIDER_SITE_OTHER): Payer: Self-pay | Admitting: Bariatrics

## 2020-05-24 VITALS — BP 133/63 | HR 87 | Temp 98.1°F | Ht 63.0 in | Wt 198.0 lb

## 2020-05-24 DIAGNOSIS — I10 Essential (primary) hypertension: Secondary | ICD-10-CM

## 2020-05-24 DIAGNOSIS — E1169 Type 2 diabetes mellitus with other specified complication: Secondary | ICD-10-CM

## 2020-05-24 DIAGNOSIS — Z6835 Body mass index (BMI) 35.0-35.9, adult: Secondary | ICD-10-CM

## 2020-05-24 DIAGNOSIS — E669 Obesity, unspecified: Secondary | ICD-10-CM

## 2020-05-24 DIAGNOSIS — E559 Vitamin D deficiency, unspecified: Secondary | ICD-10-CM | POA: Diagnosis not present

## 2020-05-24 MED ORDER — VITAMIN D (ERGOCALCIFEROL) 1.25 MG (50000 UNIT) PO CAPS: 50000.0000 [IU] | ORAL_CAPSULE | ORAL | 0 refills | Status: DC

## 2020-05-25 ENCOUNTER — Encounter (INDEPENDENT_AMBULATORY_CARE_PROVIDER_SITE_OTHER): Payer: Self-pay | Admitting: Bariatrics

## 2020-05-25 ENCOUNTER — Encounter: Payer: Self-pay | Admitting: Family

## 2020-05-25 NOTE — Progress Notes (Signed)
Chief Complaint:   OBESITY Vanessa Higgins is here to discuss her progress with her obesity treatment plan along with follow-up of her obesity related diagnoses. Vanessa Higgins is on the Category 2 Plan and states she is following her eating plan approximately 90% of the time. Vanessa Higgins states she is exercising 0 minutes 0 times per week.  Today's visit was #: 2 Starting weight: 205 lbs Starting date: 05/10/2020 Today's weight: 198 lbs Today's date: 5/262021 Total lbs lost to date: 7 Total lbs lost since last in-office visit: 7  Interim History: Vanessa Higgins is down 7 lbs. She had some obstacles (4 graduation parties). She avoided the desserts. She reports doing well with her water intake.  Subjective:   Vitamin D deficiency. Last Vitamin D 36.9 on 05/10/2020.  Essential hypertension. Vanessa Higgins is taking Cozaar and Verapamil.  BP Readings from Last 3 Encounters:  05/24/20 133/63  05/10/20 (!) 155/74  05/04/20 130/66   Lab Results  Component Value Date   CREATININE 0.64 05/08/2020   CREATININE 0.52 03/22/2020   CREATININE 0.51 10/12/2019   Type 2 diabetes mellitus with obesity (Vanessa Higgins). Fasting blood sugars typically 120's (98-130). Vanessa Higgins is taking Vanessa Higgins, metformin, and Victoza. She denies any lows.  Lab Results  Component Value Date   HGBA1C 6.9 (H) 03/22/2020   HGBA1C 7.1 (H) 10/12/2019   HGBA1C 7.3 (H) 06/10/2019   Lab Results  Component Value Date   MICROALBUR 1.6 03/22/2020   LDLCALC 119 (H) 03/22/2020   CREATININE 0.64 05/08/2020   No results found for: INSULIN  Assessment/Plan:   Vitamin D deficiency. Low Vitamin D level contributes to fatigue and are associated with obesity, breast, and colon cancer. She was given a prescription for Vitamin D, Ergocalciferol, (DRISDOL) 1.25 MG (50000 UNIT) CAPS capsule every week #4 with 0 refills and will follow-up for routine testing of Vitamin D, at least 2-3 times per year to avoid over-replacement.   Essential hypertension.  Vanessa Higgins is working on healthy weight loss and exercise to improve blood pressure control. We will watch for signs of hypotension as she continues her lifestyle modifications. She will continue her medications as directed.   Type 2 diabetes mellitus with obesity ([place]). Good blood sugar control is important to decrease the likelihood of diabetic complications such as nephropathy, neuropathy, limb loss, blindness, coronary artery disease, and death. Intensive lifestyle modification including diet, exercise and weight loss are the first line of treatment for diabetes. She will continue her current medications as directed.   Class 2 severe obesity with serious comorbidity and body mass index (BMI) of 35.0 to 35.9 in adult, unspecified obesity type (Vanessa Higgins).  Vanessa Higgins is currently in the action stage of change. As such, her goal is to continue with weight loss efforts. She has agreed to the Category 2 Plan.   She will work on meal planning and intentional eating.   We independently reviewed with the patient labs from 05/10/2020 including Vitamin D, B12, and thyroid panel.  Exercise goals: Older adults should follow the adult guidelines. When older adults cannot meet the adult guidelines, they should be as physically active as their abilities and conditions will allow.   Behavioral modification strategies: increasing lean protein intake, decreasing simple carbohydrates, increasing vegetables, increasing water intake, decreasing eating out, no skipping meals, meal planning and cooking strategies, keeping healthy foods in the home and planning for success.  Vanessa Higgins has agreed to follow-up with our clinic in 2 weeks. She was informed of the importance of frequent follow-up  visits to maximize her success with intensive lifestyle modifications for her multiple health conditions.   Objective:   Blood pressure 133/63, pulse 87, temperature 98.1 F (36.7 C), temperature source Oral, height 5\' 3"  (1.6 m), weight 198 lb  (89.8 kg), SpO2 97 %. Body mass index is 35.07 kg/m.  General: Cooperative, alert, well developed, in no acute distress. HEENT: Conjunctivae and lids unremarkable. Cardiovascular: Regular rhythm.  Lungs: Normal work of breathing. Neurologic: No focal deficits.   Lab Results  Component Value Date   CREATININE 0.64 05/08/2020   BUN 11 05/08/2020   NA 136 05/08/2020   K 4.4 05/08/2020   CL 100 05/08/2020   CO2 29 05/08/2020   Lab Results  Component Value Date   ALT 21 05/08/2020   AST 24 05/08/2020   ALKPHOS 44 05/08/2020   BILITOT 0.8 05/08/2020   Lab Results  Component Value Date   HGBA1C 6.9 (H) 03/22/2020   HGBA1C 7.1 (H) 10/12/2019   HGBA1C 7.3 (H) 06/10/2019   HGBA1C 6.8 (H) 02/18/2019   HGBA1C 7.2 (H) 11/18/2018   No results found for: INSULIN Lab Results  Component Value Date   TSH 1.290 05/10/2020   Lab Results  Component Value Date   CHOL 180 03/22/2020   HDL 39.70 03/22/2020   LDLCALC 119 (H) 03/22/2020   TRIG 109.0 03/22/2020   CHOLHDL 5 03/22/2020   Lab Results  Component Value Date   WBC 6.9 05/01/2020   HGB 9.6 (L) 05/01/2020   HCT 33.7 (L) 05/01/2020   MCV 73.4 (L) 05/01/2020   PLT 250 05/01/2020   Lab Results  Component Value Date   IRON 62 10/02/2016   TIBC 426 10/02/2016   FERRITIN 12 (L) 10/02/2016   Obesity Behavioral Intervention Documentation for Insurance:   Approximately 15 minutes were spent on the discussion below.  ASK: We discussed the diagnosis of obesity with Saprina today and Vanessa Higgins agreed to give [name] permission to discuss obesity behavioral modification therapy today.  ASSESS: Vanessa Higgins has the diagnosis of obesity and her BMI today is 35.2. Vanessa Higgins is in the action stage of change.   ADVISE: Vanessa Higgins was educated on the multiple health risks of obesity as well as the benefit of weight loss to improve her health. She was advised of the need for long term treatment and the importance of lifestyle modifications to improve her  current health and to decrease her risk of future health problems.  AGREE: Multiple dietary modification options and treatment options were discussed and Vanessa Higgins agreed to follow the recommendations documented in the above note.  ARRANGE: Vanessa Higgins was educated on the importance of frequent visits to treat obesity as outlined per CMS and USPSTF guidelines and agreed to schedule her next follow up appointment today.  Attestation Statements:   Reviewed by clinician on day of visit: allergies, medications, problem list, medical history, surgical history, family history, social history, and previous encounter notes.  Migdalia Dk, am acting as Location manager for [employer], DO   I have reviewed the above documentation for accuracy and completeness, and I agree with the above. Jearld Lesch, DO

## 2020-05-26 ENCOUNTER — Other Ambulatory Visit: Payer: Self-pay | Admitting: Family

## 2020-05-26 DIAGNOSIS — E119 Type 2 diabetes mellitus without complications: Secondary | ICD-10-CM

## 2020-05-30 ENCOUNTER — Other Ambulatory Visit: Payer: Self-pay

## 2020-05-30 ENCOUNTER — Encounter: Payer: Self-pay | Admitting: Family

## 2020-05-30 ENCOUNTER — Ambulatory Visit (INDEPENDENT_AMBULATORY_CARE_PROVIDER_SITE_OTHER): Payer: No Typology Code available for payment source | Admitting: Family

## 2020-05-30 VITALS — BP 126/60 | HR 74 | Temp 97.7°F | Ht 63.0 in | Wt 199.8 lb

## 2020-05-30 DIAGNOSIS — D509 Iron deficiency anemia, unspecified: Secondary | ICD-10-CM | POA: Diagnosis not present

## 2020-05-30 DIAGNOSIS — R0609 Other forms of dyspnea: Secondary | ICD-10-CM

## 2020-05-30 DIAGNOSIS — Z1231 Encounter for screening mammogram for malignant neoplasm of breast: Secondary | ICD-10-CM

## 2020-05-30 DIAGNOSIS — I10 Essential (primary) hypertension: Secondary | ICD-10-CM

## 2020-05-30 DIAGNOSIS — R0600 Dyspnea, unspecified: Secondary | ICD-10-CM | POA: Diagnosis not present

## 2020-05-30 LAB — URINALYSIS, ROUTINE W REFLEX MICROSCOPIC
Bilirubin Urine: NEGATIVE
Hgb urine dipstick: NEGATIVE
Ketones, ur: NEGATIVE
Nitrite: NEGATIVE
Specific Gravity, Urine: 1.01 (ref 1.000–1.030)
Total Protein, Urine: NEGATIVE
Urine Glucose: >=1000 — AB
Urobilinogen, UA: 0.2 (ref 0.0–1.0)
pH: 5.5 (ref 5.0–8.0)

## 2020-05-30 LAB — IBC + FERRITIN
Ferritin: 3.8 ng/mL — ABNORMAL LOW (ref 10.0–291.0)
Iron: 17 ug/dL — ABNORMAL LOW (ref 42–145)
Saturation Ratios: 3.2 % — ABNORMAL LOW (ref 20.0–50.0)
Transferrin: 374 mg/dL — ABNORMAL HIGH (ref 212.0–360.0)

## 2020-05-30 MED ORDER — FERROUS SULFATE 325 (65 FE) MG PO TBEC: 325.0000 mg | DELAYED_RELEASE_TABLET | Freq: Two times a day (BID) | ORAL | 2 refills | Status: DC

## 2020-05-30 NOTE — Progress Notes (Signed)
Subjective:    Patient ID: Vanessa Higgins, female    DOB: April 21, 1951, 69 y.o.   MRN: [id number]  CC: Vanessa Higgins is a 69 y.o. female who presents today for follow up.   HPI: Here today fatigue for past 3 months, unchanged  Wakes up feeling restored. Tired in the afternoons. Will take naps. Has lost 7 lbs in 2 weeks with [facility].   Noticed that tired after working in yard which is unusual for her. Legs get tired.  Works in yard on Saturdays. No formal exercise. hasnt been as active as has been.   No leg swelling, chest pain, orthopnea.  Sleeping well. Using Cipap. No depression.   H/o anemia. No blood in stool. No hematuria, coffee ground stools. Regular bowel movements.   H/o osa.   COPD- SOB is much better, recently off the advair and started on trelegy and feels 'tremendously better.'    Dr End - DOE. 05/04/2020 - echo scheduled 06/15/20  Dr Patsey Berthold- recently had PFTs  Mammogram due July. Colonoscopy UTD  Compliant with b12.   HISTORY:  Past Medical History:  Diagnosis Date  . Allergy    hay fever  . Anemia   . Ankle fracture, left 04/08/2014  . Arthritis   . Asthma   . Blood transfusion abn reaction or complication, no procedure mishap   . Blood transfusion without reported diagnosis   . Depression   . Diabetes mellitus   . Disc displacement, lumbar   . Fatty liver   . GERD (gastroesophageal reflux disease)   . Headache(784.0)   . Hyperlipidemia   . Hypertension   . Joint pain   . Low vitamin B12 level   . Low vitamin D level   . Migraine   . Obstructive sleep apnea    CPAP  . Osteoarthritis   . Palpitations   . PONV (postoperative nausea and vomiting)   . Sleep apnea 04/06/2017   uses CPAP  . SOB (shortness of breath)   . UTI (lower urinary tract infection)   . Vitamin B 12 deficiency   . Vitamin D deficiency   . Wears glasses    Past Surgical History:  Procedure Laterality Date  . APPENDECTOMY  1977  . COLONOSCOPY  last  11/17/2014  . KNEE ARTHROPLASTY Right 03/03/2019   Procedure: COMPUTER ASSISTED TOTAL KNEE ARTHROPLASTY-RIGHT;  Surgeon: Dereck Leep, MD;  Location: [facility];  Service: Orthopedics;  Laterality: Right;  . LUMBAR LAMINECTOMY/DECOMPRESSION MICRODISCECTOMY Right 02/06/2018   Procedure: Lumbar two Hemilaminectomy for Discectomy;  Surgeon: Ashok Pall, MD;  Location: [place];  Service: Neurosurgery;  Laterality: Right;  . ORIF ANKLE FRACTURE Left 04/08/2014   Procedure: OPEN REDUCTION INTERNAL FIXATION (ORIF) ANKLE FRACTURE LEFT ANKLE FRACTURE OPEN TREATMENT BIMALLEOLAR ANKLE INCLUDES INTERNAL FIXATION;  Surgeon: Johnny Bridge, MD;  Location: [place];  Service: Orthopedics;  Laterality: Left;  . PAROTIDECTOMY  1980   left  . POLYPECTOMY    . TONSILLECTOMY AND ADENOIDECTOMY     as a child  . 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 TUBAL LIGATION  05/1976   Family History  Problem Relation Age of Onset  . Sudden death Mother   . Cancer Mother 55       pancreatic  . Heart disease Father 77  . Sudden death Father   . Cancer Brother        colon  . Colon cancer Brother 16  . Stroke  Maternal Grandmother   . Heart disease Maternal Grandfather   . Cancer Sister        brain  . Breast cancer Neg Hx   . Colon polyps Neg Hx   . Esophageal cancer Neg Hx   . Rectal cancer Neg Hx   . Stomach cancer Neg Hx   . Thyroid cancer Neg Hx     Allergies: Sulfa antibiotics, Lisinopril, and Ramipril Current Outpatient Medications on File Prior to Visit  Medication Sig Dispense Refill  . albuterol (PROVENTIL HFA;VENTOLIN HFA) 108 (90 Base) MCG/ACT inhaler Inhale 2 puffs into the lungs every 6 (six) hours as needed for wheezing or shortness of breath.    Marland Kitchen aspirin 81 MG tablet Take 81 mg by mouth daily.    Marland Kitchen atorvastatin (LIPITOR) 20 MG tablet Take 1 tablet (20 mg total) by mouth daily. 90 tablet 1  . b complex vitamins tablet Take 1 tablet by mouth daily.    . cholecalciferol  (VITAMIN D3) 25 MCG (1000 UT) tablet Take 1,000 Units by mouth daily.    . Fluticasone-Umeclidin-Vilant (TRELEGY ELLIPTA) 200-62.5-25 MCG/INH AEPB Inhale 1 puff into the lungs daily. 60 each 6  . glucose blood (FREESTYLE LITE) test strip Use as instructed to check sugar up to 3 times daily 100 each 12  . insulin degludec (TRESIBA FLEXTOUCH) 100 UNIT/ML FlexTouch Pen Inject 0.2 mLs (20 Units total) into the skin at bedtime. (Patient taking differently: Inject 12 Units into the skin at bedtime. ) 15 mL 2  . Insulin Pen Needle (BD PEN NEEDLE NANO U/F) 32G X 4 MM MISC USE TWICE A DAY AS DIRECTED WITH DIABETES MEDICATION PENS 200 each 5  . JARDIANCE 25 MG TABS tablet TAKE 1 TABLET BY MOUTH ONCE DAILY 90 tablet 1  . loratadine (CLARITIN) 10 MG tablet Take 10 mg by mouth daily.    Marland Kitchen losartan (COZAAR) 25 MG tablet TAKE 1 TABLET BY MOUTH DAILY. 90 tablet 1  . meclizine (ANTIVERT) 25 MG tablet Take 1 tablet (25 mg total) by mouth 3 (three) times daily as needed for dizziness. 30 tablet 0  . metFORMIN (GLUCOPHAGE) 1000 MG tablet TAKE 1 TABLET BY MOUTH 2 TIMES DAILY WITH A MEAL. 180 tablet 3  . montelukast (SINGULAIR) 10 MG tablet TAKE 1 TABLET BY MOUTH AT BEDTIME 90 tablet 1  . Multiple Vitamins-Minerals (CENTRUM SILVER PO) Take 1 tablet by mouth daily.     Marland Kitchen omeprazole (PRILOSEC) 20 MG capsule TAKE 1 CAPSULE BY MOUTH DAILY. 90 capsule 0  . venlafaxine XR (EFFEXOR-XR) 150 MG 24 hr capsule TAKE 1 CAPSULE BY MOUTH DAILY WITH BREAKFAST. 90 capsule 1  . Verapamil HCl CR 300 MG CP24 TAKE 1 CAPSULE BY MOUTH DAILY 90 capsule 1  . VICTOZA 18 MG/3ML SOPN INJECT 1.8 MG UNDER THE SKIN AT BEDTIME 9 mL 3  . Vitamin D, Ergocalciferol, (DRISDOL) 1.25 MG (50000 UNIT) CAPS capsule Take 1 capsule (50,000 Units total) by mouth every 7 (seven) days. 4 capsule 0   No current facility-administered medications on file prior to visit.    Social History   Tobacco Use  . Smoking status: Never Smoker  . Smokeless tobacco: Never  Used  Substance Use Topics  . Alcohol use: No  . Drug use: No    Review of Systems  Constitutional: Positive for fatigue. Negative for chills, diaphoresis, fever and unexpected weight change.  Respiratory: Negative for cough and shortness of breath (resolved).   Cardiovascular: Negative for chest pain and palpitations.  Gastrointestinal: Negative for blood in stool, nausea and vomiting.  Genitourinary: Positive for hematuria.      Objective:    BP 126/60   Pulse 74   Temp 97.7 F (36.5 C) (Temporal)   Ht 5\' 3"  (1.6 m)   Wt 199 lb 12.8 oz (90.6 kg)   SpO2 95%   BMI 35.39 kg/m  BP Readings from Last 3 Encounters:  05/30/20 126/60  05/24/20 133/63  05/10/20 (!) 155/74   Wt Readings from Last 3 Encounters:  05/30/20 199 lb 12.8 oz (90.6 kg)  05/24/20 198 lb (89.8 kg)  05/10/20 205 lb (93 kg)    Physical Exam Vitals reviewed.  Constitutional:      Appearance: She is well-developed.  Eyes:     Conjunctiva/sclera: Conjunctivae normal.  Neck:     Thyroid: No thyroid mass or thyromegaly.  Cardiovascular:     Rate and Rhythm: Normal rate and regular rhythm.     Pulses: Normal pulses.     Heart sounds: Normal heart sounds.  Pulmonary:     Effort: Pulmonary effort is normal.     Breath sounds: Normal breath sounds. No wheezing, rhonchi or rales.  Lymphadenopathy:     Head:     Right side of head: No submental, submandibular, tonsillar, preauricular, posterior auricular or occipital adenopathy.     Left side of head: No submental, submandibular, tonsillar, preauricular, posterior auricular or occipital adenopathy.     Cervical: No cervical adenopathy.  Skin:    General: Skin is warm and dry.  Neurological:     Mental Status: She is alert.  Psychiatric:        Speech: Speech normal.        Behavior: Behavior normal.        Thought Content: Thought content normal.        Assessment & Plan:   Problem List Items Addressed This Visit      Cardiovascular and  Mediastinum   Essential hypertension    Stable. Continue regimen.        Other   Dyspnea on exertion    Very pleased to hear improved.  Pending echo. Will follow      IDA (iron deficiency anemia) - Primary    Discussed my concern that she may have symptomatic anemia. Suspect IDA as low MCV. Trial of iron. Pending stool cards urine and follow up in 3 months. We also discussed deconditioning may be contributory.       Relevant Medications   ferrous sulfate 325 (65 FE) MG EC tablet   Other Relevant Orders   Urinalysis, Routine w reflex microscopic   Fecal occult blood, imunochemical   IBC + Ferritin   Intrinsic Factor Antibodies    Other Visit Diagnoses    Encounter for screening mammogram for malignant neoplasm of breast       Relevant Orders   MM 3D SCREEN BREAST BILATERAL     Note: patient will schedule mammogram.  I am having Jaycee S. Wygant start on ferrous sulfate. I am also having her maintain her Multiple Vitamins-Minerals (CENTRUM SILVER PO), aspirin, b complex vitamins, loratadine, meclizine, albuterol, cholecalciferol, glucose blood, BD Pen Needle Nano U/F, metFORMIN, omeprazole, Verapamil HCl CR, montelukast, losartan, Tresiba FlexTouch, atorvastatin, Trelegy Ellipta, Vitamin D (Ergocalciferol), Victoza, Jardiance, and venlafaxine XR.   Meds ordered this encounter  Medications  . ferrous sulfate 325 (65 FE) MG EC tablet    Sig: Take 1 tablet (325 mg total) by mouth 2 (two) times daily with  a meal.    Dispense:  60 tablet    Refill:  2    Order Specific Question:   Supervising Provider    Answer:   Crecencio Mc [[id number]]    Return precautions given.   Risks, benefits, and alternatives of the medications and treatment plan prescribed today were discussed, and patient expressed understanding.   Education regarding symptom management and diagnosis given to patient on AVS.  Continue to follow with Burnard Hawthorne, FNP for routine health maintenance.   [name] and I agreed with plan.   Mable Paris, FNP

## 2020-05-30 NOTE — Assessment & Plan Note (Signed)
Stable. Continue regimen. 

## 2020-05-30 NOTE — Assessment & Plan Note (Signed)
Very pleased to hear improved.  Pending echo. Will follow

## 2020-05-30 NOTE — Assessment & Plan Note (Signed)
Discussed my concern that she may have symptomatic anemia. Suspect IDA as low MCV. Trial of iron. Pending stool cards urine and follow up in 3 months. We also discussed deconditioning may be contributory.

## 2020-05-30 NOTE — Patient Instructions (Signed)
Nice to see you!   

## 2020-06-02 ENCOUNTER — Other Ambulatory Visit: Payer: Self-pay | Admitting: Internal Medicine

## 2020-06-02 ENCOUNTER — Encounter: Payer: Self-pay | Admitting: Family

## 2020-06-02 DIAGNOSIS — E119 Type 2 diabetes mellitus without complications: Secondary | ICD-10-CM

## 2020-06-02 LAB — INTRINSIC FACTOR ANTIBODIES: Intrinsic Factor: NEGATIVE

## 2020-06-06 ENCOUNTER — Telehealth: Payer: Self-pay

## 2020-06-06 ENCOUNTER — Other Ambulatory Visit: Payer: Self-pay | Admitting: Family

## 2020-06-06 ENCOUNTER — Telehealth: Payer: Self-pay | Admitting: Family

## 2020-06-06 ENCOUNTER — Other Ambulatory Visit (INDEPENDENT_AMBULATORY_CARE_PROVIDER_SITE_OTHER): Payer: No Typology Code available for payment source

## 2020-06-06 ENCOUNTER — Encounter: Payer: Self-pay | Admitting: Family

## 2020-06-06 DIAGNOSIS — R300 Dysuria: Secondary | ICD-10-CM

## 2020-06-06 DIAGNOSIS — D509 Iron deficiency anemia, unspecified: Secondary | ICD-10-CM

## 2020-06-06 LAB — FECAL OCCULT BLOOD, IMMUNOCHEMICAL: Fecal Occult Bld: POSITIVE — AB

## 2020-06-06 NOTE — Telephone Encounter (Signed)
Critical lab  IFOB: Positive

## 2020-06-06 NOTE — Telephone Encounter (Signed)
Call pt She will need UA in 6 weeks as blood was seen in UA 05/30/20 during suspect UTI. We want to ensure this clears with treatment. Please order and sch

## 2020-06-06 NOTE — Telephone Encounter (Signed)
Spoke with patient She is aware of positive stool card and wil call dr Steve Rattler office to  Make follow up appointment She hasnt started iron and has been advised to do so  Complains of dysuria Would like to come in tomorrow  Lab drop

## 2020-06-06 NOTE — Telephone Encounter (Signed)
Telephone encounter closed prior to being complete Dysuria x 1 one day UA shows + bacteria blood.  Will do UC tomorrow and patient will come in No fever, chills. Declines empiric antibiotic therapy today. Will await urine culture

## 2020-06-06 NOTE — Telephone Encounter (Signed)
I called and LVM for the patient to call back. Tymel Conely,cma   

## 2020-06-07 ENCOUNTER — Other Ambulatory Visit: Payer: No Typology Code available for payment source

## 2020-06-07 ENCOUNTER — Other Ambulatory Visit: Payer: Self-pay

## 2020-06-07 DIAGNOSIS — R300 Dysuria: Secondary | ICD-10-CM

## 2020-06-07 NOTE — Telephone Encounter (Signed)
close

## 2020-06-09 ENCOUNTER — Other Ambulatory Visit: Payer: Self-pay

## 2020-06-09 ENCOUNTER — Other Ambulatory Visit: Payer: Self-pay | Admitting: Family Medicine

## 2020-06-09 DIAGNOSIS — N3001 Acute cystitis with hematuria: Secondary | ICD-10-CM

## 2020-06-09 LAB — URINE CULTURE
MICRO NUMBER:: 10571289
SPECIMEN QUALITY:: ADEQUATE

## 2020-06-09 MED ORDER — CEPHALEXIN 500 MG PO CAPS: 500.0000 mg | ORAL_CAPSULE | Freq: Two times a day (BID) | ORAL | 0 refills | Status: DC

## 2020-06-09 NOTE — Progress Notes (Signed)
Reordered Keflex because patient had gotten off work and wanted it sent to [facility].  Hampton Wixom,cma

## 2020-06-10 NOTE — Progress Notes (Signed)
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 She has IDA with Hb 9.6, MCV 73 (04/2020), iron studies c/w IDA.  Neg colon 12/08/2019.  Plan: -CT AP with PO and IV contrast (RE: GI Bleed) -Proceed with EGD with SB bx (pl go ahead and schedule at [facility]). Can hold PO iron 5 days before -Keep appt already made with me. -CBC, CMP at time of EGD  RG

## 2020-06-14 ENCOUNTER — Encounter (INDEPENDENT_AMBULATORY_CARE_PROVIDER_SITE_OTHER): Payer: Self-pay | Admitting: Family Medicine

## 2020-06-14 ENCOUNTER — Other Ambulatory Visit: Payer: Self-pay

## 2020-06-14 ENCOUNTER — Ambulatory Visit (INDEPENDENT_AMBULATORY_CARE_PROVIDER_SITE_OTHER): Payer: No Typology Code available for payment source | Admitting: Family Medicine

## 2020-06-14 VITALS — BP 119/70 | HR 75 | Temp 98.4°F | Ht 63.0 in | Wt 194.0 lb

## 2020-06-14 DIAGNOSIS — E559 Vitamin D deficiency, unspecified: Secondary | ICD-10-CM

## 2020-06-14 DIAGNOSIS — E1169 Type 2 diabetes mellitus with other specified complication: Secondary | ICD-10-CM

## 2020-06-14 DIAGNOSIS — Z794 Long term (current) use of insulin: Secondary | ICD-10-CM

## 2020-06-14 DIAGNOSIS — E669 Obesity, unspecified: Secondary | ICD-10-CM

## 2020-06-14 DIAGNOSIS — Z9189 Other specified personal risk factors, not elsewhere classified: Secondary | ICD-10-CM | POA: Diagnosis not present

## 2020-06-14 DIAGNOSIS — Z6834 Body mass index (BMI) 34.0-34.9, adult: Secondary | ICD-10-CM

## 2020-06-14 MED ORDER — VITAMIN D (ERGOCALCIFEROL) 1.25 MG (50000 UNIT) PO CAPS: 50000.0000 [IU] | ORAL_CAPSULE | ORAL | 0 refills | Status: DC

## 2020-06-14 NOTE — Progress Notes (Signed)
Chief Complaint:   OBESITY Vanessa Higgins is here to discuss her progress with her obesity treatment plan along with follow-up of her obesity related diagnoses. Vanessa Higgins is on the Category 2 Plan and states she is following her eating plan approximately 75% of the time. [name] states she is doing 0 minutes 0 times per week.  Today's visit was #: 3 Starting weight: 205 lbs Starting date: 05/10/2020 Today's weight: 194 lbs Today's date: 06/14/2020 Total lbs lost to date: 11 Total lbs lost since last in-office visit: 4  Interim History: [name] has done well despite attending several celebrations. She has made good choices. She is eating all of the meat on the plan. Her hunger is satisfied mostly.  Subjective:   1. Type 2 diabetes mellitus without complication, with long-term current use of insulin (HCC) [name]'s diabetes mellitus is well controlled on Tresiba, Jardiance, metformin, and Victoza. Her fasting BGs range between 105 and 118, and 2 hour post prandial range between 103 and 140. She denies hypoglycemia.  Lab Results  Component Value Date   HGBA1C 6.9 (H) 03/22/2020   HGBA1C 7.1 (H) 10/12/2019   HGBA1C 7.3 (H) 06/10/2019   Lab Results  Component Value Date   MICROALBUR 1.6 03/22/2020   LDLCALC 119 (H) 03/22/2020   CREATININE 0.64 05/08/2020   No results found for: INSULIN  2. Vitamin D deficiency Vanessa Higgins's last Vit D level was low at 36.9. She is on prescription Vit D.  3. At risk for osteoporosis Vanessa Higgins is at higher risk of osteopenia and osteoporosis due to Vitamin D deficiency.   Assessment/Plan:   1. Type 2 diabetes mellitus without complication, with long-term current use of insulin (HCC)  [name] will continue all her medications, and will follow up as directed.  2. Vitamin D deficiency  [name] will follow-up for routine testing of Vitamin D, at least 2-3 times per year to avoid over-replacement. We will recheck labs in 2 months.  - Vitamin D, Ergocalciferol, (DRISDOL)  1.25 MG (50000 UNIT) CAPS capsule; Take 1 capsule (50,000 Units total) by mouth every 7 (seven) days.  Dispense: 4 capsule; Refill: 0  3. At risk for osteoporosis [name] was given approximately 15 minutes of osteoporosis prevention counseling today. Vanessa Higgins is at risk for osteopenia and osteoporosis due to her Vitamin D deficiency. She was encouraged to take her Vitamin D and follow her higher calcium diet and increase strengthening exercise to help strengthen her bones and decrease her risk of osteopenia and osteoporosis.  Repetitive spaced learning was employed today to elicit superior memory formation and behavioral change.  4. Class 1 obesity with serious comorbidity and body mass index (BMI) of 34.0 to 34.9 in adult, unspecified obesity type Vanessa Higgins is currently in the action stage of change. As such, her goal is to continue with weight loss efforts. She has agreed to the Category 2 Plan.   Exercise goals: As is.  Behavioral modification strategies: avoiding temptations and planning for success.  Vanessa Higgins has agreed to follow-up with our clinic in 2 weeks. She was informed of the importance of frequent follow-up visits to maximize her success with intensive lifestyle modifications for her multiple health conditions.   Objective:   Blood pressure 119/70, pulse 75, temperature 98.4 F (36.9 C), temperature source Oral, height 5\' 3"  (1.6 m), weight 194 lb (88 kg), SpO2 92 %. Body mass index is 34.37 kg/m.  General: Cooperative, alert, well developed, in no acute distress. HEENT: Conjunctivae and lids unremarkable. Cardiovascular: Regular rhythm.  Lungs:  Normal work of breathing. Neurologic: No focal deficits.   Lab Results  Component Value Date   CREATININE 0.64 05/08/2020   BUN 11 05/08/2020   NA 136 05/08/2020   K 4.4 05/08/2020   CL 100 05/08/2020   CO2 29 05/08/2020   Lab Results  Component Value Date   ALT 21 05/08/2020   AST 24 05/08/2020   ALKPHOS 44 05/08/2020   BILITOT  0.8 05/08/2020   Lab Results  Component Value Date   HGBA1C 6.9 (H) 03/22/2020   HGBA1C 7.1 (H) 10/12/2019   HGBA1C 7.3 (H) 06/10/2019   HGBA1C 6.8 (H) 02/18/2019   HGBA1C 7.2 (H) 11/18/2018   No results found for: INSULIN Lab Results  Component Value Date   TSH 1.290 05/10/2020   Lab Results  Component Value Date   CHOL 180 03/22/2020   HDL 39.70 03/22/2020   LDLCALC 119 (H) 03/22/2020   TRIG 109.0 03/22/2020   CHOLHDL 5 03/22/2020   Lab Results  Component Value Date   WBC 6.9 05/01/2020   HGB 9.6 (L) 05/01/2020   HCT 33.7 (L) 05/01/2020   MCV 73.4 (L) 05/01/2020   PLT 250 05/01/2020   Lab Results  Component Value Date   IRON 17 (L) 05/30/2020   TIBC 426 10/02/2016   FERRITIN 3.8 (L) 05/30/2020   Attestation Statements:   Reviewed by clinician on day of visit: allergies, medications, problem list, medical history, surgical history, family history, social history, and previous encounter notes.   Wilhemena Durie, am acting as Location manager for [employer], FNP-C.  I have reviewed the above documentation for accuracy and completeness, and I agree with the above. -  Georgianne Fick, FNP

## 2020-06-15 ENCOUNTER — Encounter (INDEPENDENT_AMBULATORY_CARE_PROVIDER_SITE_OTHER): Payer: Self-pay | Admitting: Family Medicine

## 2020-06-15 ENCOUNTER — Ambulatory Visit (INDEPENDENT_AMBULATORY_CARE_PROVIDER_SITE_OTHER): Payer: No Typology Code available for payment source

## 2020-06-15 DIAGNOSIS — R0600 Dyspnea, unspecified: Secondary | ICD-10-CM | POA: Diagnosis not present

## 2020-06-15 DIAGNOSIS — R0609 Other forms of dyspnea: Secondary | ICD-10-CM

## 2020-06-16 NOTE — Telephone Encounter (Signed)
Urine scheduled

## 2020-06-19 ENCOUNTER — Other Ambulatory Visit: Payer: Self-pay

## 2020-06-19 DIAGNOSIS — D509 Iron deficiency anemia, unspecified: Secondary | ICD-10-CM

## 2020-06-19 DIAGNOSIS — K922 Gastrointestinal hemorrhage, unspecified: Secondary | ICD-10-CM

## 2020-06-21 ENCOUNTER — Telehealth: Payer: Self-pay | Admitting: Gastroenterology

## 2020-06-21 ENCOUNTER — Other Ambulatory Visit (INDEPENDENT_AMBULATORY_CARE_PROVIDER_SITE_OTHER): Payer: No Typology Code available for payment source

## 2020-06-21 DIAGNOSIS — D509 Iron deficiency anemia, unspecified: Secondary | ICD-10-CM

## 2020-06-21 DIAGNOSIS — K922 Gastrointestinal hemorrhage, unspecified: Secondary | ICD-10-CM

## 2020-06-21 LAB — BUN: BUN: 16 mg/dL (ref 6–23)

## 2020-06-21 LAB — CREATININE, SERUM: Creatinine, Ser: 0.62 mg/dL (ref 0.40–1.20)

## 2020-06-26 ENCOUNTER — Telehealth: Payer: Self-pay | Admitting: Gastroenterology

## 2020-06-26 ENCOUNTER — Ambulatory Visit (HOSPITAL_COMMUNITY)
Admission: RE | Admit: 2020-06-26 | Discharge: 2020-06-26 | Disposition: A | Discharge status: Home / Self Care | Payer: No Typology Code available for payment source | Source: Ambulatory Visit | Attending: Gastroenterology | Admitting: Gastroenterology

## 2020-06-26 ENCOUNTER — Encounter (HOSPITAL_COMMUNITY): Payer: Self-pay

## 2020-06-26 ENCOUNTER — Other Ambulatory Visit: Payer: Self-pay | Admitting: Gastroenterology

## 2020-06-26 ENCOUNTER — Other Ambulatory Visit: Payer: Self-pay

## 2020-06-26 DIAGNOSIS — D509 Iron deficiency anemia, unspecified: Secondary | ICD-10-CM

## 2020-06-26 DIAGNOSIS — K922 Gastrointestinal hemorrhage, unspecified: Secondary | ICD-10-CM

## 2020-06-27 ENCOUNTER — Other Ambulatory Visit: Payer: Self-pay | Admitting: Gastroenterology

## 2020-06-28 ENCOUNTER — Other Ambulatory Visit: Payer: Self-pay

## 2020-06-28 ENCOUNTER — Ambulatory Visit (INDEPENDENT_AMBULATORY_CARE_PROVIDER_SITE_OTHER): Payer: No Typology Code available for payment source | Admitting: Family Medicine

## 2020-06-28 ENCOUNTER — Encounter (INDEPENDENT_AMBULATORY_CARE_PROVIDER_SITE_OTHER): Payer: Self-pay | Admitting: Family Medicine

## 2020-06-28 ENCOUNTER — Ambulatory Visit: Payer: No Typology Code available for payment source | Admitting: Family

## 2020-06-28 VITALS — BP 134/68 | HR 80 | Temp 98.4°F | Ht 63.0 in | Wt 192.0 lb

## 2020-06-28 DIAGNOSIS — E1169 Type 2 diabetes mellitus with other specified complication: Secondary | ICD-10-CM | POA: Diagnosis not present

## 2020-06-28 DIAGNOSIS — E669 Obesity, unspecified: Secondary | ICD-10-CM

## 2020-06-28 DIAGNOSIS — D509 Iron deficiency anemia, unspecified: Secondary | ICD-10-CM

## 2020-06-28 DIAGNOSIS — Z794 Long term (current) use of insulin: Secondary | ICD-10-CM | POA: Diagnosis not present

## 2020-06-28 DIAGNOSIS — Z6834 Body mass index (BMI) 34.0-34.9, adult: Secondary | ICD-10-CM

## 2020-06-29 ENCOUNTER — Encounter (HOSPITAL_COMMUNITY): Payer: Self-pay

## 2020-06-29 ENCOUNTER — Other Ambulatory Visit: Payer: Self-pay

## 2020-06-29 ENCOUNTER — Telehealth: Payer: Self-pay | Admitting: Gastroenterology

## 2020-06-29 ENCOUNTER — Ambulatory Visit (HOSPITAL_COMMUNITY): Admission: RE | Admit: 2020-06-29 | Payer: No Typology Code available for payment source | Source: Ambulatory Visit

## 2020-06-29 DIAGNOSIS — K922 Gastrointestinal hemorrhage, unspecified: Secondary | ICD-10-CM

## 2020-06-29 NOTE — Telephone Encounter (Signed)
Patient needs order for CT

## 2020-07-04 ENCOUNTER — Encounter (INDEPENDENT_AMBULATORY_CARE_PROVIDER_SITE_OTHER): Payer: Self-pay | Admitting: Family Medicine

## 2020-07-04 ENCOUNTER — Telehealth: Payer: Self-pay | Admitting: Gastroenterology

## 2020-07-04 ENCOUNTER — Telehealth: Payer: Self-pay | Admitting: Pulmonary Disease

## 2020-07-04 NOTE — Progress Notes (Signed)
Chief Complaint:   OBESITY Vanessa Higgins is here to discuss her progress with her obesity treatment plan along with follow-up of her obesity related diagnoses. Vanessa Higgins is on the Category 2 Plan and states she is following her eating plan approximately 75% of the time. Vanessa Higgins states she is walking for 15-20 minutes 2-3 times per week.  Today's visit was #: 4 Starting weight: 205 lbs Starting date: 05/10/2020 Today's weight: 192 lbs Today's date: 06/28/2020 Total lbs lost to date: 13 Total lbs lost since last in-office visit: 2  Interim History: Vanessa Higgins notes cravings after and before supper. She does try to have a snack with protein before supper.  She has started walking with her daughter.  Subjective:   1. Type 2 diabetes mellitus without complication, with long-term current use of insulin (HCC) Janya's diabetes mellitus is well controlled. She is on Tresiba, Jardiance, metformin, and Victoza. Her fasting BGs range between 112 and 135, and 2 hour post prandial range between 103 and 148. Her diabetes mellitus is managed by her primary care physician.  Lab Results  Component Value Date   HGBA1C 6.9 (H) 03/22/2020   HGBA1C 7.1 (H) 10/12/2019   HGBA1C 7.3 (H) 06/10/2019   Lab Results  Component Value Date   MICROALBUR 1.6 03/22/2020   LDLCALC 119 (H) 03/22/2020   CREATININE 0.62 06/21/2020   No results found for: INSULIN  2. Iron deficiency anemia, unspecified iron deficiency anemia type Vanessa Higgins is on oral iron. Last hemoglobin was 9.6 on 05/01/2020. She has hem +  stool, and will have CT tomorrow. She had a colonoscopy in December 2020 that was normal. Her brother had colon cancer.   Assessment/Plan:   1. Type 2 diabetes mellitus without complication, with long-term current use of insulin (HCC)  Ramsey will continue all her medications, and we will recheck labs at her next office visit.  2. Iron deficiency anemia, unspecified iron deficiency anemia type We will check labs at her next  office visit. Vanessa Higgins will follow up as directed.   3. Class 1 obesity with serious comorbidity and body mass index (BMI) of 34.0 to 34.9 in adult, unspecified obesity type Vanessa Higgins is currently in the action stage of change. As such, her goal is to continue with weight loss efforts. She has agreed to the Category 2 Plan.   Exercise goals: As is.  Behavioral modification strategies: better snacking choices.  Vanessa Higgins has agreed to follow-up with our clinic in 2 weeks. She was informed of the importance of frequent follow-up visits to maximize her success with intensive lifestyle modifications for her multiple health conditions.   Objective:   Blood pressure 134/68, pulse 80, temperature 98.4 F (36.9 C), temperature source Oral, height 5\' 3"  (1.6 m), weight 192 lb (87.1 kg), SpO2 96 %. Body mass index is 34.01 kg/m.  General: Cooperative, alert, well developed, in no acute distress. HEENT: Conjunctivae and lids unremarkable. Cardiovascular: Regular rhythm.  Lungs: Normal work of breathing. Neurologic: No focal deficits.   Lab Results  Component Value Date   CREATININE 0.62 06/21/2020   BUN 16 06/21/2020   NA 136 05/08/2020   K 4.4 05/08/2020   CL 100 05/08/2020   CO2 29 05/08/2020   Lab Results  Component Value Date   ALT 21 05/08/2020   AST 24 05/08/2020   ALKPHOS 44 05/08/2020   BILITOT 0.8 05/08/2020   Lab Results  Component Value Date   HGBA1C 6.9 (H) 03/22/2020   HGBA1C 7.1 (H) 10/12/2019  HGBA1C 7.3 (H) 06/10/2019   HGBA1C 6.8 (H) 02/18/2019   HGBA1C 7.2 (H) 11/18/2018   No results found for: INSULIN Lab Results  Component Value Date   TSH 1.290 05/10/2020   Lab Results  Component Value Date   CHOL 180 03/22/2020   HDL 39.70 03/22/2020   LDLCALC 119 (H) 03/22/2020   TRIG 109.0 03/22/2020   CHOLHDL 5 03/22/2020   Lab Results  Component Value Date   WBC 6.9 05/01/2020   HGB 9.6 (L) 05/01/2020   HCT 33.7 (L) 05/01/2020   MCV 73.4 (L) 05/01/2020   PLT 250  05/01/2020   Lab Results  Component Value Date   IRON 17 (L) 05/30/2020   TIBC 426 10/02/2016   FERRITIN 3.8 (L) 05/30/2020   Attestation Statements:   Reviewed by clinician on day of visit: allergies, medications, problem list, medical history, surgical history, family history, social history, and previous encounter notes.   Vanessa Higgins, am acting as Location manager for [employer], FNP-C.  I have reviewed the above documentation for accuracy and completeness, and I agree with the above. -  Georgianne Fick, FNP

## 2020-07-04 NOTE — Telephone Encounter (Signed)
Patient calling stating she received a call from [facility] about scheduling an EGD. Patient has an appointment made for 7-14 to discuss EGD and had an appointment for the EGD in the [place] was 7-13 but it has been cancelled. I told patient I would reach out to the doctor to understand more since I don't know anything about this. Does patient need to see you before having procedure or can she be rescheduled for EGD and does she need labs the day of the procedure? Please let me know as soon possible.  Patient will be working tomorrow so I can call work but will be off Thursday afternoon and could call the home phone in the afternoon.

## 2020-07-04 NOTE — Telephone Encounter (Signed)
Pt was contacted to see if her appt time for 07/19/2019 could be adjusting, however this is no longer needed.  Pt is aware and voiced her understanding.  Nothing further is needed.

## 2020-07-06 ENCOUNTER — Ambulatory Visit (HOSPITAL_COMMUNITY)
Admission: RE | Admit: 2020-07-06 | Discharge: 2020-07-06 | Disposition: A | Discharge status: Home / Self Care | Payer: No Typology Code available for payment source | Source: Ambulatory Visit | Attending: Gastroenterology | Admitting: Gastroenterology

## 2020-07-06 ENCOUNTER — Other Ambulatory Visit: Payer: Self-pay

## 2020-07-06 ENCOUNTER — Encounter (HOSPITAL_COMMUNITY): Payer: Self-pay

## 2020-07-06 DIAGNOSIS — D509 Iron deficiency anemia, unspecified: Secondary | ICD-10-CM | POA: Diagnosis not present

## 2020-07-06 DIAGNOSIS — K922 Gastrointestinal hemorrhage, unspecified: Secondary | ICD-10-CM | POA: Diagnosis present

## 2020-07-06 MED ORDER — IOHEXOL 350 MG/ML SOLN
100.0000 mL | Freq: Once | INTRAVENOUS | Status: AC | PRN
  Administered 2020-07-06: 100 mL via INTRAVENOUS

## 2020-07-06 MED ORDER — SODIUM CHLORIDE (PF) 0.9 % IJ SOLN
INTRAMUSCULAR | Status: AC
  Filled 2020-07-06: qty 50

## 2020-07-10 ENCOUNTER — Encounter: Payer: Self-pay | Admitting: Gastroenterology

## 2020-07-10 ENCOUNTER — Other Ambulatory Visit: Payer: Self-pay

## 2020-07-10 DIAGNOSIS — D509 Iron deficiency anemia, unspecified: Secondary | ICD-10-CM

## 2020-07-11 ENCOUNTER — Other Ambulatory Visit: Payer: Self-pay

## 2020-07-11 ENCOUNTER — Ambulatory Visit (AMBULATORY_SURGERY_CENTER): Payer: No Typology Code available for payment source | Admitting: Gastroenterology

## 2020-07-11 ENCOUNTER — Other Ambulatory Visit: Payer: Self-pay | Admitting: Family

## 2020-07-11 ENCOUNTER — Encounter: Payer: No Typology Code available for payment source | Admitting: Gastroenterology

## 2020-07-11 ENCOUNTER — Encounter: Payer: Self-pay | Admitting: Gastroenterology

## 2020-07-11 VITALS — BP 118/61 | HR 71 | Temp 98.0°F | Resp 16 | Ht 64.0 in | Wt 192.0 lb

## 2020-07-11 DIAGNOSIS — K449 Diaphragmatic hernia without obstruction or gangrene: Secondary | ICD-10-CM

## 2020-07-11 DIAGNOSIS — K2950 Unspecified chronic gastritis without bleeding: Secondary | ICD-10-CM | POA: Diagnosis not present

## 2020-07-11 DIAGNOSIS — K2970 Gastritis, unspecified, without bleeding: Secondary | ICD-10-CM

## 2020-07-11 DIAGNOSIS — D509 Iron deficiency anemia, unspecified: Secondary | ICD-10-CM

## 2020-07-11 DIAGNOSIS — K317 Polyp of stomach and duodenum: Secondary | ICD-10-CM

## 2020-07-11 DIAGNOSIS — J45909 Unspecified asthma, uncomplicated: Secondary | ICD-10-CM

## 2020-07-11 MED ORDER — SODIUM CHLORIDE 0.9 % IV SOLN: 500.0000 mL | Freq: Once | INTRAVENOUS | Status: DC

## 2020-07-11 NOTE — Progress Notes (Signed)
VS by CW. ?

## 2020-07-11 NOTE — Patient Instructions (Signed)
No Aspirin ,Ibuprofen,Naproxen, Or other non steroidal anti inflammatory drugs   Return to clinic in 4 weeks - make this appointment  Await biopsy results    YOU HAD AN ENDOSCOPIC PROCEDURE TODAY AT [facility]:   Refer to the procedure report that was given to you for any specific questions about what was found during the examination.  If the procedure report does not answer your questions, please call your gastroenterologist to clarify.  If you requested that your care partner not be given the details of your procedure findings, then the procedure report has been included in a sealed envelope for you to review at your convenience later.  YOU SHOULD EXPECT: Some feelings of bloating in the abdomen. Passage of more gas than usual.  Walking can help get rid of the air that was put into your GI tract during the procedure and reduce the bloating. If you had a lower endoscopy (such as a colonoscopy or flexible sigmoidoscopy) you may notice spotting of blood in your stool or on the toilet paper. If you underwent a bowel prep for your procedure, you may not have a normal bowel movement for a few days.  Please Note:  You might notice some irritation and congestion in your nose or some drainage.  This is from the oxygen used during your procedure.  There is no need for concern and it should clear up in a day or so.  SYMPTOMS TO REPORT IMMEDIATELY:     Following upper endoscopy (EGD)  Vomiting of blood or coffee ground material  New chest pain or pain under the shoulder blades  Painful or persistently difficult swallowing  New shortness of breath  Fever of 100F or higher  Black, tarry-looking stools  For urgent or emergent issues, a gastroenterologist can be reached at any hour by calling [phone]. Do not use MyChart messaging for urgent concerns.    DIET:  We do recommend a small meal at first, but then you may proceed to your regular diet.  Drink plenty of fluids  but you should avoid alcoholic beverages for 24 hours.  ACTIVITY:  You should plan to take it easy for the rest of today and you should NOT DRIVE or use heavy machinery until tomorrow (because of the sedation medicines used during the test).    FOLLOW UP: Our staff will call the number listed on your records 48-72 hours following your procedure to check on you and address any questions or concerns that you may have regarding the information given to you following your procedure. If we do not reach you, we will leave a message.  We will attempt to reach you two times.  During this call, we will ask if you have developed any symptoms of COVID 19. If you develop any symptoms (ie: fever, flu-like symptoms, shortness of breath, cough etc.) before then, please call [phone].  If you test positive for Covid 19 in the 2 weeks post procedure, please call and report this information to [name].    If any biopsies were taken you will be contacted by phone or by letter within the next 1-3 weeks.  Please call us at [phone] if you have not heard about the biopsies in 3 weeks.    SIGNATURES/CONFIDENTIALITY: You and/or your care partner have signed paperwork which will be entered into your electronic medical record.  These signatures attest to the fact that that the information above on your After Visit Summary has been reviewed and  is understood.  Full responsibility of the confidentiality of this discharge information lies with you and/or your care-partner.

## 2020-07-11 NOTE — Progress Notes (Signed)
I have discussed with Vanessa Higgins given her a copy of the CT I have also reviewed the CT myself. Also has a large inguinal hernia which is not causing her to have any problems.  But needs surgical eval (nonurgent).  She will let us know when she comes for follow-up visit.

## 2020-07-11 NOTE — Progress Notes (Signed)
Report to PACU, RN, vss, BBS= Clear.  

## 2020-07-11 NOTE — Op Note (Signed)
[facility] Patient Name: Vanessa Higgins Procedure Date: 07/11/2020 8:56 AM MRN: [id number] Endoscopist: Jackquline Denmark , MD Age: 69 Referring MD:  Date of Birth: 1951/07/20 Gender: Female Account #: [id number] Procedure:                Upper GI endoscopy Indications:              Iron deficiency anemia with abn CT showing gastric                            wall thickening. Medicines:                Monitored Anesthesia Care Procedure:                Pre-Anesthesia Assessment:                           - Prior to the procedure, a History and Physical                            was performed, and patient medications and                            allergies were reviewed. The patient's tolerance of                            previous anesthesia was also reviewed. The risks                            and benefits of the procedure and the sedation                            options and risks were discussed with the patient.                            All questions were answered, and informed consent                            was obtained. Prior Anticoagulants: The patient has                            taken no previous anticoagulant or antiplatelet                            agents. ASA Grade Assessment: II - A patient with                            mild systemic disease. After reviewing the risks                            and benefits, the patient was deemed in                            satisfactory condition to undergo the procedure.  After obtaining informed consent, the endoscope was                            passed under direct vision. Throughout the                            procedure, the patient's blood pressure, pulse, and                            oxygen saturations were monitored continuously. The                            Endoscope was introduced through the mouth, and                            advanced to the second part of duodenum.  The upper                            GI endoscopy was accomplished without difficulty.                            The patient tolerated the procedure well. Scope In: Scope Out: Findings:                 The examined esophagus was normal with well-defined                            Z-line at 35 cm. Examined by NBI.                           A 2 cm hiatal hernia was present. 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 There                            was mild gastritis. Biopsies were taken with a cold                            forceps for histology using tunnel technique. The                            antrum and pylorus was normal. Multiple biopsies  were also obtained from the antrum and sent in                            separate container to rule out HP                           The examined duodenum was normal. Biopsies for                            histology were taken with a cold forceps for                            evaluation of celiac disease. Complications:            No immediate complications. Estimated Blood Loss:     Estimated blood loss: none. Impression:               - Small hiatal hernia.                           - Mildly prominent gastric folds. Biopsied. Recommendation:           - Patient has a contact number available for                            emergencies. The signs and symptoms of potential                            delayed complications were discussed with the                            patient. Return to normal activities tomorrow.                            Written discharge instructions were provided to the                            patient.                           - Resume previous diet.                            - Continue present medications.                           - Await pathology results.                           - No aspirin, ibuprofen, naproxen, or other                            non-steroidal anti-inflammatory drugs.                           - Return to GI clinic in 4 weeks. Jackquline Denmark, MD 07/11/2020 9:24:40 AM This report has been signed electronically.

## 2020-07-12 ENCOUNTER — Encounter (INDEPENDENT_AMBULATORY_CARE_PROVIDER_SITE_OTHER): Payer: Self-pay | Admitting: Family Medicine

## 2020-07-12 ENCOUNTER — Ambulatory Visit (INDEPENDENT_AMBULATORY_CARE_PROVIDER_SITE_OTHER): Payer: No Typology Code available for payment source | Admitting: Family Medicine

## 2020-07-12 ENCOUNTER — Ambulatory Visit: Payer: No Typology Code available for payment source | Admitting: Gastroenterology

## 2020-07-12 VITALS — BP 138/66 | HR 79 | Temp 98.3°F | Ht 63.0 in | Wt 188.0 lb

## 2020-07-12 DIAGNOSIS — E1169 Type 2 diabetes mellitus with other specified complication: Secondary | ICD-10-CM

## 2020-07-12 DIAGNOSIS — E559 Vitamin D deficiency, unspecified: Secondary | ICD-10-CM | POA: Diagnosis not present

## 2020-07-12 DIAGNOSIS — Z6833 Body mass index (BMI) 33.0-33.9, adult: Secondary | ICD-10-CM

## 2020-07-12 DIAGNOSIS — E538 Deficiency of other specified B group vitamins: Secondary | ICD-10-CM | POA: Diagnosis not present

## 2020-07-12 DIAGNOSIS — Z794 Long term (current) use of insulin: Secondary | ICD-10-CM

## 2020-07-12 DIAGNOSIS — D508 Other iron deficiency anemias: Secondary | ICD-10-CM

## 2020-07-12 DIAGNOSIS — E669 Obesity, unspecified: Secondary | ICD-10-CM

## 2020-07-12 DIAGNOSIS — E785 Hyperlipidemia, unspecified: Secondary | ICD-10-CM

## 2020-07-12 MED ORDER — VITAMIN D (ERGOCALCIFEROL) 1.25 MG (50000 UNIT) PO CAPS: 50000.0000 [IU] | ORAL_CAPSULE | ORAL | 0 refills | Status: DC

## 2020-07-13 ENCOUNTER — Telehealth: Payer: Self-pay

## 2020-07-13 LAB — LIPID PANEL WITH LDL/HDL RATIO
Cholesterol, Total: 105 mg/dL (ref 100–199)
HDL: 41 mg/dL (ref >39)
LDL Chol Calc (NIH): 49 mg/dL (ref 0–99)
LDL/HDL Ratio: 1.2 ratio (ref 0.0–3.2)
Triglycerides: 70 mg/dL (ref 0–149)
VLDL Cholesterol Cal: 15 mg/dL (ref 5–40)

## 2020-07-13 LAB — COMPREHENSIVE METABOLIC PANEL
ALT: 12 IU/L (ref 0–32)
AST: 16 IU/L (ref 0–40)
Albumin/Globulin Ratio: 1.6 (ref 1.2–2.2)
Albumin: 4.5 g/dL (ref 3.8–4.8)
Alkaline Phosphatase: 48 IU/L (ref 48–121)
BUN/Creatinine Ratio: 27 (ref 12–28)
BUN: 16 mg/dL (ref 8–27)
Bilirubin Total: 0.5 mg/dL (ref 0.0–1.2)
CO2: 25 mmol/L (ref 20–29)
Calcium: 9.3 mg/dL (ref 8.7–10.3)
Chloride: 101 mmol/L (ref 96–106)
Creatinine, Ser: 0.59 mg/dL (ref 0.57–1.00)
GFR calc Af Amer: 107 mL/min/{1.73_m2} (ref >59)
GFR calc non Af Amer: 93 mL/min/{1.73_m2} (ref >59)
Globulin, Total: 2.8 g/dL (ref 1.5–4.5)
Glucose: 107 mg/dL — ABNORMAL HIGH (ref 65–99)
Potassium: 4.4 mmol/L (ref 3.5–5.2)
Sodium: 139 mmol/L (ref 134–144)
Total Protein: 7.3 g/dL (ref 6.0–8.5)

## 2020-07-13 LAB — CBC WITH DIFFERENTIAL/PLATELET
Basophils Absolute: 0.1 10*3/uL (ref 0.0–0.2)
Basos: 1 %
EOS (ABSOLUTE): 0.4 10*3/uL (ref 0.0–0.4)
Eos: 6 %
Hemoglobin: 11.3 g/dL (ref 11.1–15.9)
Immature Grans (Abs): 0 10*3/uL (ref 0.0–0.1)
Immature Granulocytes: 0 %
Lymphocytes Absolute: 1.7 10*3/uL (ref 0.7–3.1)
Lymphs: 28 %
MCH: 22.6 pg — ABNORMAL LOW (ref 26.6–33.0)
MCHC: 30.7 g/dL — ABNORMAL LOW (ref 31.5–35.7)
MCV: 74 fL — ABNORMAL LOW (ref 79–97)
Monocytes Absolute: 0.5 10*3/uL (ref 0.1–0.9)
Monocytes: 8 %
Neutrophils Absolute: 3.4 10*3/uL (ref 1.4–7.0)
Neutrophils: 57 %
Platelets: 422 10*3/uL (ref 150–450)
RBC: 5.01 x10E6/uL (ref 3.77–5.28)
RDW: 21.9 % — ABNORMAL HIGH (ref 11.7–15.4)
WBC: 6.1 10*3/uL (ref 3.4–10.8)

## 2020-07-13 LAB — ANEMIA PANEL
Ferritin: 8 ng/mL — ABNORMAL LOW (ref 15–150)
Folate, Hemolysate: 323 ng/mL
Folate, RBC: 878 ng/mL (ref >498)
Hematocrit: 36.8 % (ref 34.0–46.6)
Iron Saturation: 6 % — CL (ref 15–55)
Iron: 26 ug/dL — ABNORMAL LOW (ref 27–139)
Retic Ct Pct: 1.7 % (ref 0.6–2.6)
Total Iron Binding Capacity: 401 ug/dL (ref 250–450)
UIBC: 375 ug/dL — ABNORMAL HIGH (ref 118–369)
Vitamin B-12: >2000 pg/mL — ABNORMAL HIGH (ref 232–1245)

## 2020-07-13 LAB — HEMOGLOBIN A1C
Est. average glucose Bld gHb Est-mCnc: 126 mg/dL
Hgb A1c MFr Bld: 6 % — ABNORMAL HIGH (ref 4.8–5.6)

## 2020-07-13 NOTE — Telephone Encounter (Signed)
  Follow up Call-  Call back number 07/11/2020 12/08/2019  Post procedure Call Back phone  # [phone]  Permission to leave phone message No Yes  Some recent data might be hidden     Patient questions:  Do you have a fever, pain , or abdominal swelling? No. Pain Score  0 *  Have you tolerated food without any problems? Yes.    Have you been able to return to your normal activities? Yes.    Do you have any questions about your discharge instructions: Diet   No. Medications  No. Follow up visit  No.  Do you have questions or concerns about your Care? No.  Actions: * If pain score is 4 or above: No action needed, pain <4.  1. Have you developed a fever since your procedure? no  2.   Have you had an respiratory symptoms (SOB or cough) since your procedure? no  3.   Have you tested positive for COVID 19 since your procedure no  4.   Have you had any family members/close contacts diagnosed with the COVID 19 since your procedure?  no   If yes to any of these questions please route to Joylene John, RN and Erenest Rasher, RN

## 2020-07-13 NOTE — Telephone Encounter (Signed)
Called pt at work number, unavailable to answer at this time.

## 2020-07-13 NOTE — Progress Notes (Signed)
Chief Complaint:   OBESITY Vanessa Higgins is here to discuss her progress with her obesity treatment plan along with follow-up of her obesity related diagnoses. Vanessa Higgins is on the Category 2 Plan and states she is following her eating plan approximately 75% of the time. Vanessa Higgins states she is walking for 15-20 minutes 2-3 times per week.  Today's visit was #: 5 Starting weight: 205 lbs Starting date: 05/10/2020 Today's weight: 188 lbs Today's date: 07/12/2020 Total lbs lost to date: 17 Total lbs lost since last in-office visit: 4  Interim History: Vanessa Higgins has had some sweet cravings over the past few weeks. She has been eating all of her food on the plan. Her hunger is satisfied. She has been undergoing testing for heme positive stool. EGD was negative. She had a colonoscopy in dec 2020 which was negative. She will be going to the beach in 2 weeks.  Subjective:   1. Vitamin D deficiency Vanessa Higgins's last Vit D was slightly low at 36.9. She is on weekly prescription Vit D. She notes fatigue but she has anemia.  2. Type 2 diabetes mellitus with other specified complication, with long-term current use of insulin (Vanessa Higgins) Vanessa Higgins's diabetes mellitus is well controlled on Victoza. Fasting BGs range between 101 and 128, and 2 hour post prandial range between 102 and 161. She denies hypoglycemia. Her diabetes is managed by her primary care physician. She is on Victoza, Jardiance, Tresiba, and metformin.  Lab Results  Component Value Date   HGBA1C 6.0 (H) 07/12/2020   HGBA1C 6.9 (H) 03/22/2020   HGBA1C 7.1 (H) 10/12/2019   Lab Results  Component Value Date   MICROALBUR 1.6 03/22/2020   LDLCALC 49 07/12/2020   CREATININE 0.59 07/12/2020   No results found for: INSULIN  3. Other iron deficiency anemia Vanessa Higgins has been on iron once daily. It is prescribed BID but she was unaware. Her energy is improving.  She recently had a positive stool.  CBC Latest Ref Rng & Units 07/12/2020 05/01/2020 10/12/2019  WBC 3.4 -  10.8 x10E3/uL 6.1 6.9 5.9  Hemoglobin 11.1 - 15.9 g/dL 11.3 9.6(L) 10.3 Repeated and verified X2.(L)  Hematocrit 34.0 - 46.6 % 36.8 33.7(L) 33.8(L)  Platelets 150 - 450 x10E3/uL 422 250 462.0(H)   Lab Results  Component Value Date   IRON 26 (L) 07/12/2020   TIBC 401 07/12/2020   FERRITIN 8 (L) 07/12/2020   Lab Results  Component Value Date   VITAMINB12 >2000 (H) 07/12/2020   4. Other hyperlipidemia Vanessa Higgins has hyperlipidemia and has been trying to improve her cholesterol levels with intensive lifestyle modification including a low saturated fat diet, exercise and weight loss. Last LDL was elevated at 119, HDL low at 39, and triglycerides within normal limits. She is taking 20 mg of atorvastatin.   Lab Results  Component Value Date   ALT 12 07/12/2020   AST 16 07/12/2020   ALKPHOS 48 07/12/2020   BILITOT 0.5 07/12/2020   Lab Results  Component Value Date   CHOL 105 07/12/2020   HDL 41 07/12/2020   LDLCALC 49 07/12/2020   TRIG 70 07/12/2020   CHOLHDL 5 03/22/2020   5. Low serum vitamin B12 Vanessa Higgins is on B12 supplementation. Last B12 was within normal limits at 830.  Lab Results  Component Value Date   VITAMINB12 >2000 (H) 07/12/2020   Assessment/Plan:   1. Vitamin D deficiency Low Vitamin D level contributes to fatigue and are associated with obesity, breast, and colon cancer. We will refill  prescription Vitamin D for 1 month. Vanessa Higgins will follow-up for routine testing of Vitamin D, at least 2-3 times per year to avoid over-replacement. We will recheck labs next month.  - Vitamin D, Ergocalciferol, (DRISDOL) 1.25 MG (50000 UNIT) CAPS capsule; Take 1 capsule (50,000 Units total) by mouth every 7 (seven) days.  Dispense: 4 capsule; Refill: 0  2. Type 2 diabetes mellitus with other specified complication, with long-term current use of insulin (HCC) Good blood sugar control is important to decrease the likelihood of diabetic complications such as nephropathy, neuropathy, limb  loss, blindness, coronary artery disease, and death. Intensive lifestyle modification including diet, exercise and weight loss are the first line of treatment for diabetes. Vanessa Higgins will continue all her medications, and we will check labs today.  - Comprehensive metabolic panel - Hemoglobin A1c  3. Other iron deficiency anemia We will check labs today. Vanessa Higgins is to take iron BID as prescribed.  - CBC with Differential/Platelet  4. Other hyperlipidemia Cardiovascular risk and specific lipid/LDL goals reviewed. We discussed several lifestyle modifications today and Vanessa Higgins will continue to work on diet, exercise and weight loss efforts. Vanessa Higgins is to continue statin and we will check labs today.  - Comprehensive metabolic panel - Lipid Panel With LDL/HDL Ratio  5. Low serum vitamin B12 The diagnosis was reviewed with the patient. Counseling provided today, see below. We will continue to monitor and we will check labs today.  - Vitamin B12  6. Class 1 obesity with serious comorbidity and body mass index (BMI) of 33.0 to 33.9 in adult, unspecified obesity type Vanessa Higgins is currently in the action stage of change. As such, her goal is to continue with weight loss efforts. She has agreed to the Category 2 Plan.   Exercise goals: Vanessa Higgins is to increase cardio. Exercise bands were provided, and she was advised to uses bands 2 times weekly.  Behavioral modification strategies: better snacking choices and travel eating strategies.  Vanessa Higgins has agreed to follow-up with our clinic in 2 weeks. She was informed of the importance of frequent follow-up visits to maximize her success with intensive lifestyle modifications for her multiple health conditions.   Vanessa Higgins was informed we would discuss her lab results at her next visit unless there is a critical issue that needs to be addressed sooner. Vanessa Higgins agreed to keep her next visit at the agreed upon time to discuss these results.  Objective:   Blood pressure 138/66,  pulse 79, temperature 98.3 F (36.8 C), temperature source Oral, height 5\' 3"  (1.6 m), weight 188 lb (85.3 kg), SpO2 90 %. Body mass index is 33.3 kg/m.  General: Cooperative, alert, well developed, in no acute distress. HEENT: Conjunctivae and lids unremarkable. Cardiovascular: Regular rhythm.  Lungs: Normal work of breathing. Neurologic: No focal deficits.   Lab Results  Component Value Date   CREATININE 0.59 07/12/2020   BUN 16 07/12/2020   NA 139 07/12/2020   K 4.4 07/12/2020   CL 101 07/12/2020   CO2 25 07/12/2020   Lab Results  Component Value Date   ALT 12 07/12/2020   AST 16 07/12/2020   ALKPHOS 48 07/12/2020   BILITOT 0.5 07/12/2020   Lab Results  Component Value Date   HGBA1C 6.0 (H) 07/12/2020   HGBA1C 6.9 (H) 03/22/2020   HGBA1C 7.1 (H) 10/12/2019   HGBA1C 7.3 (H) 06/10/2019   HGBA1C 6.8 (H) 02/18/2019   No results found for: INSULIN Lab Results  Component Value Date   TSH 1.290 05/10/2020  Lab Results  Component Value Date   CHOL 105 07/12/2020   HDL 41 07/12/2020   LDLCALC 49 07/12/2020   TRIG 70 07/12/2020   CHOLHDL 5 03/22/2020   Lab Results  Component Value Date   WBC 6.1 07/12/2020   HGB 11.3 07/12/2020   HCT 36.8 07/12/2020   MCV 74 (L) 07/12/2020   PLT 422 07/12/2020   Lab Results  Component Value Date   IRON 26 (L) 07/12/2020   TIBC 401 07/12/2020   FERRITIN 8 (L) 07/12/2020    Obesity Behavioral Intervention Documentation for Insurance:   Approximately 15 minutes were spent on the discussion below.  ASK: We discussed the diagnosis of obesity with Soriyah today and Alivia agreed to give [name] permission to discuss obesity behavioral modification therapy today.  ASSESS: Almee has the diagnosis of obesity and her BMI today is 33.31. Trease is in the action stage of change.   ADVISE: Aleera was educated on the multiple health risks of obesity as well as the benefit of weight loss to improve her health. She was advised of the need  for long term treatment and the importance of lifestyle modifications to improve her current health and to decrease her risk of future health problems.  AGREE: Multiple dietary modification options and treatment options were discussed and Gratia agreed to follow the recommendations documented in the above note.  ARRANGE: Enrica was educated on the importance of frequent visits to treat obesity as outlined per CMS and USPSTF guidelines and agreed to schedule her next follow up appointment today.  Attestation Statements:   Reviewed by clinician on day of visit: allergies, medications, problem list, medical history, surgical history, family history, social history, and previous encounter notes.   Wilhemena Durie, am acting as Location manager for [employer], FNP-C.  I have reviewed the above documentation for accuracy and completeness, and I agree with the above. -  Georgianne Fick, FNP

## 2020-07-14 DIAGNOSIS — E538 Deficiency of other specified B group vitamins: Secondary | ICD-10-CM | POA: Insufficient documentation

## 2020-07-14 DIAGNOSIS — E785 Hyperlipidemia, unspecified: Secondary | ICD-10-CM | POA: Insufficient documentation

## 2020-07-14 DIAGNOSIS — E1169 Type 2 diabetes mellitus with other specified complication: Secondary | ICD-10-CM | POA: Insufficient documentation

## 2020-07-16 ENCOUNTER — Encounter: Payer: Self-pay | Admitting: Gastroenterology

## 2020-07-17 ENCOUNTER — Other Ambulatory Visit: Payer: No Typology Code available for payment source

## 2020-07-18 ENCOUNTER — Encounter: Payer: Self-pay | Admitting: Pulmonary Disease

## 2020-07-18 ENCOUNTER — Other Ambulatory Visit: Payer: Self-pay

## 2020-07-18 ENCOUNTER — Other Ambulatory Visit (INDEPENDENT_AMBULATORY_CARE_PROVIDER_SITE_OTHER): Payer: No Typology Code available for payment source

## 2020-07-18 ENCOUNTER — Ambulatory Visit: Payer: No Typology Code available for payment source | Admitting: Pulmonary Disease

## 2020-07-18 ENCOUNTER — Other Ambulatory Visit: Payer: No Typology Code available for payment source

## 2020-07-18 VITALS — BP 136/80 | HR 74 | Temp 97.5°F | Ht 63.0 in | Wt 195.0 lb

## 2020-07-18 DIAGNOSIS — N3001 Acute cystitis with hematuria: Secondary | ICD-10-CM | POA: Diagnosis not present

## 2020-07-18 DIAGNOSIS — J4540 Moderate persistent asthma, uncomplicated: Secondary | ICD-10-CM

## 2020-07-18 LAB — POCT URINALYSIS DIPSTICK
Bilirubin, UA: NEGATIVE
Glucose, UA: POSITIVE — AB
Protein, UA: NEGATIVE
Spec Grav, UA: 1.015 (ref 1.010–1.025)
Urobilinogen, UA: 0.2 E.U./dL
pH, UA: 5 (ref 5.0–8.0)

## 2020-07-18 NOTE — Progress Notes (Signed)
 Assessment & Plan:  There are no diagnoses linked to this encounter.  Patient Instructions  Continue Trelegy  We will see him in follow-up in 3 months time call sooner should any new difficulties arise  Please note: late entry documentation due to logistical difficulties during COVID-19 pandemic. This note is filed for information purposes only, and is not intended to be used for billing, nor does it represent the full scope/nature of the visit in question. Please see any associated scanned media linked to date of encounter for additional pertinent information.  Subjective:    HPI: Vanessa Higgins is a 69 y.o. female presenting to the pulmonology clinic on 07/18/2020 with report of: Follow-up (c/o non prod cough. )     Outpatient Encounter Medications as of 07/18/2020  Medication Sig   loratadine  (CLARITIN ) 10 MG tablet Take 10 mg by mouth at bedtime.    [DISCONTINUED] albuterol  (PROVENTIL  HFA;VENTOLIN  HFA) 108 (90 Base) MCG/ACT inhaler Inhale 2 puffs into the lungs every 6 (six) hours as needed for wheezing or shortness of breath.   [DISCONTINUED] aspirin  81 MG tablet Take 81 mg by mouth daily.  (Patient not taking: Reported on 09/27/2020)   [DISCONTINUED] atorvastatin  (LIPITOR) 20 MG tablet Take 1 tablet (20 mg total) by mouth daily. (Patient taking differently: Take 20 mg by mouth at bedtime. )   [DISCONTINUED] b complex vitamins tablet Take 1 tablet by mouth daily.   [DISCONTINUED] cholecalciferol  (VITAMIN D3) 25 MCG (1000 UT) tablet Take 2,000 Units by mouth daily.  (Patient not taking: Reported on 02/14/2021)   [DISCONTINUED] ferrous sulfate  325 (65 FE) MG EC tablet Take 1 tablet (325 mg total) by mouth 2 (two) times daily with a meal. (Patient taking differently: Take 325 mg by mouth at bedtime.)   [DISCONTINUED] Fluticasone -Umeclidin-Vilant (TRELEGY ELLIPTA ) 200-62.5-25 MCG/INH AEPB Inhale 1 puff into the lungs daily.   [DISCONTINUED] glucose blood (FREESTYLE LITE) test strip  USE AS INSTRUCTED TO CHECK SUGAR UP TO 3 TIMES DAILY   [DISCONTINUED] insulin  degludec (TRESIBA  FLEXTOUCH) 100 UNIT/ML FlexTouch Pen Inject 0.2 mLs (20 Units total) into the skin at bedtime. (Patient taking differently: Inject 6 Units into the skin at bedtime.)   [DISCONTINUED] Insulin  Pen Needle (BD PEN NEEDLE NANO U/F) 32G X 4 MM MISC USE TWICE A DAY AS DIRECTED WITH DIABETES MEDICATION PENS   [DISCONTINUED] JARDIANCE  25 MG TABS tablet TAKE 1 TABLET BY MOUTH ONCE DAILY (Patient taking differently: Take 25 mg by mouth daily.)   [DISCONTINUED] losartan  (COZAAR ) 25 MG tablet TAKE 1 TABLET BY MOUTH DAILY.   [DISCONTINUED] meclizine  (ANTIVERT ) 25 MG tablet Take 1 tablet (25 mg total) by mouth 3 (three) times daily as needed for dizziness. (Patient not taking: Reported on 09/27/2020)   [DISCONTINUED] metFORMIN  (GLUCOPHAGE ) 1000 MG tablet TAKE 1 TABLET BY MOUTH 2 TIMES DAILY WITH A MEAL. (Patient taking differently: Take 1,000 mg by mouth 2 (two) times daily with a meal. )   [DISCONTINUED] montelukast  (SINGULAIR ) 10 MG tablet TAKE 1 TABLET BY MOUTH AT BEDTIME (Patient taking differently: Take 10 mg by mouth at bedtime.)   [DISCONTINUED] Multiple Vitamins-Minerals (CENTRUM SILVER PO) Take 1 tablet by mouth daily.    [DISCONTINUED] omeprazole  (PRILOSEC) 20 MG capsule TAKE 1 CAPSULE BY MOUTH DAILY. (Patient taking differently: Take 20 mg by mouth every morning. )   [DISCONTINUED] venlafaxine  XR (EFFEXOR -XR) 150 MG 24 hr capsule TAKE 1 CAPSULE BY MOUTH DAILY WITH BREAKFAST. (Patient taking differently: Take 150 mg by mouth daily with breakfast. )   [  DISCONTINUED] Verapamil  HCl CR 300 MG CP24 TAKE 1 CAPSULE BY MOUTH DAILY   [DISCONTINUED] VICTOZA  18 MG/3ML SOPN INJECT 1.8 MG UNDER THE SKIN AT BEDTIME (Patient taking differently: Inject 1.8 mg into the skin at bedtime. )   [DISCONTINUED] Vitamin D , Ergocalciferol , (DRISDOL ) 1.25 MG (50000 UNIT) CAPS capsule Take 1 capsule (50,000 Units total) by mouth every 7  (seven) days.   No facility-administered encounter medications on file as of 07/18/2020.      Objective:   Vitals:   07/18/20 1538  BP: 136/80  Pulse: 74  Temp: (!) 97.5 F (36.4 C)  Height: 5' 3 (1.6 m)  Weight: 195 lb (88.5 kg)  SpO2: 95%  TempSrc: Temporal  BMI (Calculated): 34.55     Physical exam documentation is limited by delayed entry of information.

## 2020-07-18 NOTE — Patient Instructions (Signed)
Continue Trelegy  We will see him in follow-up in 3 months time call sooner should any new difficulties arise

## 2020-07-19 ENCOUNTER — Other Ambulatory Visit: Payer: No Typology Code available for payment source

## 2020-07-19 ENCOUNTER — Other Ambulatory Visit: Payer: Self-pay | Admitting: Family

## 2020-07-19 DIAGNOSIS — R8290 Unspecified abnormal findings in urine: Secondary | ICD-10-CM

## 2020-07-19 LAB — URINALYSIS, MICROSCOPIC ONLY

## 2020-07-20 ENCOUNTER — Telehealth: Payer: Self-pay

## 2020-07-20 ENCOUNTER — Encounter: Payer: Self-pay | Admitting: Family

## 2020-07-20 DIAGNOSIS — R300 Dysuria: Secondary | ICD-10-CM

## 2020-07-20 MED ORDER — AMOXICILLIN-POT CLAVULANATE 875-125 MG PO TABS: 1.0000 | ORAL_TABLET | Freq: Two times a day (BID) | ORAL | 0 refills | Status: AC

## 2020-07-20 NOTE — Telephone Encounter (Signed)
Call and spoke with patient from home  complain of dysuria, urinary frequency x 2 days.  No hematuria, fever, nausea, vomiting, flank pain.  She feels that her symptoms completely resolved after taking Augmentin approximately 6 weeks ago.  Diarrhea has completely resolved as well since CT 07/06/20.  Urine culture pending.  I discussed with patient I would go ahead and send another course of Augmentin and as that had been an appropriate agent with her prior urine culture.  She may start this tomorrow as she is heading into work if her symptoms have progressed.  Otherwise she will wait on urine culture.  Advised over the weekend if symptoms were to worsen she develop fever, nausea or vomiting, she understands to be seen in person urgent care or emergency room  She understands the importance of taking probiotics particularly as she has had prior recent antibiotics

## 2020-07-20 NOTE — Telephone Encounter (Signed)
Referral faxed to Dr. Loa Socks per patient request.  Per Dr Lyndel Safe patient needs hernia surgical consultation.

## 2020-07-20 NOTE — Telephone Encounter (Signed)
Attempted to call patient and scheduled visit for another date as Vanessa Higgins is out of office on 07/21/20. Left a voicemail to call back.

## 2020-07-21 LAB — URINE CULTURE
MICRO NUMBER:: 10732270
SPECIMEN QUALITY:: ADEQUATE

## 2020-07-21 NOTE — Telephone Encounter (Signed)
Attempted to call patient and scheduled visit for another date as Vanessa Higgins is out of office on 07/21/20. Left a voicemail to call back.

## 2020-07-21 NOTE — Telephone Encounter (Signed)
° °  ° °  Attempted to call patient and scheduled visit for another date as Vanessa Higgins is out of office on 07/21/20. Left a voicemail to call back.

## 2020-07-22 ENCOUNTER — Other Ambulatory Visit: Payer: Self-pay | Admitting: Family

## 2020-07-22 DIAGNOSIS — R319 Hematuria, unspecified: Secondary | ICD-10-CM

## 2020-07-24 ENCOUNTER — Telehealth: Payer: Self-pay

## 2020-07-24 NOTE — Telephone Encounter (Signed)
3rd attempt to call patient and reschedule Video Visit. Left a verbal message to have the patient call back and reschedule.

## 2020-07-24 NOTE — Telephone Encounter (Signed)
3rd attempt to call patient and reschedule appointment. Left a verbal message to have the patient call back and reschedule.

## 2020-07-27 ENCOUNTER — Ambulatory Visit (INDEPENDENT_AMBULATORY_CARE_PROVIDER_SITE_OTHER): Payer: No Typology Code available for payment source | Admitting: Bariatrics

## 2020-07-27 ENCOUNTER — Other Ambulatory Visit: Payer: Self-pay

## 2020-07-27 ENCOUNTER — Encounter (INDEPENDENT_AMBULATORY_CARE_PROVIDER_SITE_OTHER): Payer: Self-pay | Admitting: Bariatrics

## 2020-07-27 VITALS — BP 100/68 | HR 82 | Temp 98.5°F | Ht 63.0 in | Wt 190.0 lb

## 2020-07-27 DIAGNOSIS — D508 Other iron deficiency anemias: Secondary | ICD-10-CM | POA: Diagnosis not present

## 2020-07-27 DIAGNOSIS — Z794 Long term (current) use of insulin: Secondary | ICD-10-CM

## 2020-07-27 DIAGNOSIS — E1169 Type 2 diabetes mellitus with other specified complication: Secondary | ICD-10-CM

## 2020-07-27 DIAGNOSIS — Z9189 Other specified personal risk factors, not elsewhere classified: Secondary | ICD-10-CM | POA: Diagnosis not present

## 2020-07-27 DIAGNOSIS — E669 Obesity, unspecified: Secondary | ICD-10-CM | POA: Diagnosis not present

## 2020-07-27 DIAGNOSIS — Z6833 Body mass index (BMI) 33.0-33.9, adult: Secondary | ICD-10-CM

## 2020-07-27 NOTE — Progress Notes (Signed)
Chief Complaint:   OBESITY Vanessa Higgins is here to discuss her progress with her obesity treatment plan along with Higgins of her obesity related diagnoses. Vanessa Higgins is on the Category 2 Plan and states she is following her eating plan approximately 50% of the time. Vanessa Higgins states she is walking 20 minutes 3 times per week.  Today's visit was #: 6 Starting weight: 205 lbs Starting date: 05/10/2020 Today's weight: 190 lbs Today's date: 07/27/2020 Total lbs lost to date: 15 Total lbs lost since last in-office visit: 0  Interim History: Vanessa Higgins's weight is up 2 lbs.  Subjective:   Other iron deficiency anemia. Vanessa Higgins had an upper endoscopy and CT scan for positive Hemoccult stool. She had a colonoscopy on 12/08/2019.  CBC Latest Ref Rng & Units 07/12/2020 05/01/2020 10/12/2019  WBC 3.4 - 10.8 x10E3/uL 6.1 6.9 5.9  Hemoglobin 11.1 - 15.9 g/dL 11.3 9.6(L) 10.3 Repeated and verified X2.(L)  Hematocrit 34.0 - 46.6 % 36.8 33.7(L) 33.8(L)  Platelets 150 - 450 x10E3/uL 422 250 462.0(H)   Lab Results  Component Value Date   IRON 26 (L) 07/12/2020   TIBC 401 07/12/2020   FERRITIN 8 (L) 07/12/2020   Lab Results  Component Value Date   VITAMINB12 >2000 (H) 07/12/2020   Type 2 diabetes mellitus with other specified complication, with long-term current use of insulin ([place]). Vanessa Higgins is taking Jardiance, Victoza, metformin, and [place]. A1c 6.0 - previously was 6.9. Fasting blood sugars are in the range of 90 to 100's.  Lab Results  Component Value Date   HGBA1C 6.0 (H) 07/12/2020   HGBA1C 6.9 (H) 03/22/2020   HGBA1C 7.1 (H) 10/12/2019   Lab Results  Component Value Date   MICROALBUR 1.6 03/22/2020   LDLCALC 49 07/12/2020   CREATININE 0.59 07/12/2020   No results found for: INSULIN  Assessment/Plan:   Other iron deficiency anemia. Orders and follow up as documented in patient record. Vanessa Higgins with GI in 2 weeks. Dr. Jackquline Denmark was emailed (patient aware) to see if she  needs an iron infusion.  Counseling . Iron is essential for our bodies to make red blood cells.  Reasons that someone may be deficient include: an iron-deficient diet (more likely in those following vegan or vegetarian diets), women with heavy menses, patients with GI disorders or poor absorption, patients that have had bariatric surgery, frequent blood donors, patients with cancer, and patients with heart disease.   Vanessa Higgins Kitchen An iron supplement has been recommended. This is found over-the-counter.   Vanessa Higgins include dark leafy greens, red and white meats, eggs, seafood, and beans.   . Certain Higgins and drinks prevent your body from absorbing iron properly. Avoid eating these Higgins in the same meal as iron-rich Higgins or with iron supplements. These Higgins include: coffee, black tea, and red wine; milk, dairy products, and Higgins that are high in calcium; beans and soybeans; whole grains.  . Constipation can be a side effect of iron supplementation. Increased water and fiber intake are helpful. Water goal: > 2 liters/day. Fiber goal: > 25 grams/day.  Type 2 diabetes mellitus with other specified complication, with long-term current use of insulin ([place]). Good blood sugar control is important to decrease the likelihood of diabetic complications such as nephropathy, neuropathy, limb loss, blindness, coronary artery disease, and death. Intensive lifestyle modification including diet, exercise and weight loss are the first line of treatment for diabetes. Vanessa Higgins will continue her medications as directed. Vanessa Higgins will be decreased from  12 units to 6 units and she may stop at her next visit. Vanessa Higgins will check fasting blood sugars and 2-hour postprandials.  Class 1 obesity with serious comorbidity and body mass index (BMI) of 33.0 to 33.9 in adult, unspecified obesity type.  Vanessa Higgins in the action stage of change. As such, her goal is to continue with weight loss efforts. She has agreed to the Category 2  Plan.   She will work on meal planning.  We reviewed with the patient labs from 07/12/2020 including CMP, lipids, B12, and A1c.  Exercise goals: Older adults should follow the adult guidelines. When older adults cannot meet the adult guidelines, they should be as physically active as their abilities and conditions will allow.   Behavioral modification strategies: increasing lean protein intake, decreasing simple carbohydrates, increasing vegetables, increasing water intake, decreasing eating out, no skipping meals, meal planning and cooking strategies, keeping healthy Higgins in the home and planning for success.  Vanessa Higgins has agreed to Higgins with our clinic in 2-3 weeks. She was informed of the importance of frequent Higgins visits to maximize her success with intensive lifestyle modifications for her multiple health conditions.   Objective:   Pulse 82, temperature 98.5 F (36.9 C), height 5\' 3"  (1.6 m), weight 190 lb (86.2 kg), SpO2 94 %. Body mass index is 33.66 kg/m.  General: Cooperative, alert, well developed, in no acute distress. HEENT: Conjunctivae and lids unremarkable. Cardiovascular: Regular rhythm.  Lungs: Normal work of breathing. Neurologic: No focal deficits.   Lab Results  Component Value Date   CREATININE 0.59 07/12/2020   BUN 16 07/12/2020   NA 139 07/12/2020   K 4.4 07/12/2020   CL 101 07/12/2020   CO2 25 07/12/2020   Lab Results  Component Value Date   ALT 12 07/12/2020   AST 16 07/12/2020   ALKPHOS 48 07/12/2020   BILITOT 0.5 07/12/2020   Lab Results  Component Value Date   HGBA1C 6.0 (H) 07/12/2020   HGBA1C 6.9 (H) 03/22/2020   HGBA1C 7.1 (H) 10/12/2019   HGBA1C 7.3 (H) 06/10/2019   HGBA1C 6.8 (H) 02/18/2019   No results found for: INSULIN Lab Results  Component Value Date   TSH 1.290 05/10/2020   Lab Results  Component Value Date   CHOL 105 07/12/2020   HDL 41 07/12/2020   LDLCALC 49 07/12/2020   TRIG 70 07/12/2020   CHOLHDL 5  03/22/2020   Lab Results  Component Value Date   WBC 6.1 07/12/2020   HGB 11.3 07/12/2020   HCT 36.8 07/12/2020   MCV 74 (L) 07/12/2020   PLT 422 07/12/2020   Lab Results  Component Value Date   IRON 26 (L) 07/12/2020   TIBC 401 07/12/2020   FERRITIN 8 (L) 07/12/2020   Obesity Behavioral Intervention Documentation for Insurance:   Approximately 15 minutes were spent on the discussion below.  ASK: We discussed the diagnosis of obesity with [place] today and Vanessa Higgins agreed to give [name] permission to discuss obesity behavioral modification therapy today.  ASSESS: Vanessa Higgins has the diagnosis of obesity and her BMI today is 33.7. Vanessa Higgins is in the action stage of change.   ADVISE: Vanessa Higgins was educated on the multiple health risks of obesity as well as the benefit of weight loss to improve her health. She was advised of the need for long term treatment and the importance of lifestyle modifications to improve her current health and to decrease her risk of future health problems.  AGREE: Multiple dietary modification  options and treatment options were discussed and Vanessa Higgins agreed to follow the recommendations documented in the above note.  ARRANGE: Vanessa Higgins was educated on the importance of frequent visits to treat obesity as outlined per CMS and USPSTF guidelines and agreed to schedule her next follow up appointment today.  Attestation Statements:   Reviewed by clinician on day of visit: allergies, medications, problem list, medical history, surgical history, family history, social history, and previous encounter notes.  Vanessa Higgins, am acting as Location manager for [employer], DO   I have reviewed the above documentation for accuracy and completeness, and I agree with the above. Jearld Lesch, DO

## 2020-08-03 ENCOUNTER — Other Ambulatory Visit: Payer: Self-pay | Admitting: Family

## 2020-08-08 ENCOUNTER — Other Ambulatory Visit: Payer: Self-pay | Admitting: General Surgery

## 2020-08-08 NOTE — Progress Notes (Signed)
Patient ID: Vanessa Higgins is a 69 y.o. female.  HPI  The following portions of the patient's history were reviewed and updated as appropriate.  This a new patient is here today for: office visit. Patient was referred by Dr. Lyndel Safe for evaluation of a hernia. She denies any pain or a knot. She states it was discovered on a CT scan that was done for blood in stool with negative colonoscopy and endoscopy.  She had her upper endoscopy and lower GI endoscopy in [place].  She reports her gastroenterologist had deferred Givens capsule endoscopy after identification of the hernia.  The patient is participating in a medically directed weight loss program, reporting 18 pounds over the last several months.  She is not aware of any blood in the stools.  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      Chief Complaint  Patient presents with  . Hernia     Temp 36.2 C (97.1 F)   Ht 160 cm (_0 )   Wt 86.6 kg (191 lb)   BMI 33.83 kg/m       Past Medical History:  Diagnosis Date  . Asthma 07/09/2012  . Benign neoplasm of colon   . Borderline systolic HTN 1/95/0932  . Chronic obstructive asthma, unspecified (CMS-HCC)   . Colitis, unspecified   . Diabetes (CMS-HCC) 06/24/2014  . Essential hypertension 07/09/2012  . Fatty infiltration of liver 09/22/2014  . Gastroesophageal reflux disease without esophagitis 02/01/2015  . GERD (gastroesophageal reflux disease)   . H/O adenomatous polyp of colon   . History of migraine headaches   . HTN (hypertension)   . IDA (iron deficiency anemia) 10/20/2014  . Iron deficiency anemia, unspecified   . Obesity 06/24/2014  . OSA (obstructive sleep apnea) 04/21/2017   Last Assessment & Plan:  compliant.  . Other and unspecified hyperlipidemia   . PVC's (premature ventricular contractions) 08/01/2017  . Rhinitis   . Type 2 diabetes mellitus (CMS-HCC)   . Vitamin D deficiency 12/02/2014          Past Surgical  History:  Procedure Laterality Date  . APPENDECTOMY    . COLONOSCOPY  02/09/2010   Adenomatous Polyp  . COLONOSCOPY  11/17/2014   04/26/2011; PH Adenomatous Polyp: CBF 10/2019 Recall ltr mailed   . EGD  02/09/2010   No repeat per RTE  . EGD  11/17/2014   No repeat per RTE  . FLEXIBLE SIGMOIDOSCOPY  1980s   Colitis  . LAPAROSCOPIC TUBAL LIGATION    . 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 Marry Guan  . OPEN REDUCTION ANKLE FRACTURE Left 04/07/2014  . PARTIAL THYROIDECTOMY Left   . POLYPECTOMY    . Right total knee arthroplasty using computer-assisted navigation  03/03/2019   Dr Marry Guan  . TONSILLECTOMY & ADENOIDECTOMY  1955     OB History   No obstetric history on file.     Social History          Socioeconomic History  . Marital status: Widowed    Spouse name: Not on file  . Number of children: Not on file  . Years of education: 57  . Highest education level: Not on file  Occupational History  . Occupation: Full-time-RN [employer]  Tobacco Use  . Smoking status: Never Smoker  . Smokeless tobacco: Never Used  Substance and Sexual Activity  . Alcohol use: No  . Drug use: No  . Sexual activity: Never  Partners: Male  Other Topics Concern  . Not on file  Social History Narrative  . Not on file   Social Determinants of Health      Financial Resource Strain:   . Difficulty of Paying Living Expenses:   Food Insecurity:   . Worried About Charity fundraiser in the Last Year:   . Arboriculturist in the Last Year:   Transportation Needs:   . Film/video editor (Medical):   Marland Kitchen Lack of Transportation (Non-Medical):            Allergies  Allergen Reactions  . Sulfa (Sulfonamide Antibiotics) Shortness Of Breath and Rash  . Ramipril Cough    Current Medications        Current Outpatient Medications  Medication Sig Dispense Refill  . albuterol sulfate 90 mcg/actuation AePB Inhale 2  puffs using inhaler as needed    . atorvastatin (LIPITOR) 10 MG tablet TAKE 1 TABLET BY MOUTH ONCE DAILY **MUST MAKE AN APPOINTMENT** 90 tablet 0  . b complex vitamins tablet Take 1 tablet by mouth once daily      . BD ULTRA-FINE NANO PEN NEEDLE 32 gauge x 5/32" Ndle USE ONCE A DAY AS DIRECTED WITH DIABETES MEDICATION PENS 100 each 3  . cholecalciferol (VITAMIN D3) 1,000 unit tablet Take by mouth    . CONTOUR NEXT TEST STRIPS test strip USE AS DIRECTED 2 TIMES DAILY. 100 each 3  . docusate (COLACE) 100 MG capsule Take 100 mg by mouth once daily    . ferrous sulfate 325 (65 FE) MG tablet Take 325 mg by mouth 2 (two) times daily with meals    . FREESTYLE LANCETS lancets   1  . FREESTYLE LITE METER kit   0  . JARDIANCE 25 mg Tab tablet Take 25 mg by mouth once daily    1  . loratadine (CLARITIN) 10 mg tablet Take 10 mg by mouth once daily.    Marland Kitchen losartan (COZAAR) 25 MG tablet Take 25 mg by mouth once daily.    . metFORMIN (GLUCOPHAGE) 1000 MG tablet Take 1,000 mg by mouth 2 (two) times daily with meals    3  . mometasone (NASONEX) 50 mcg/actuation nasal spray Place 2 sprays into both nostrils as needed.    . montelukast (SINGULAIR) 10 mg tablet Take 10 mg by mouth once daily.     Marland Kitchen omeprazole (PRILOSEC) 20 MG DR capsule Take 20 mg by mouth once daily      . TRESIBA FLEXTOUCH U-100 pen injector (concentration 100 units/mL)     . venlafaxine (EFFEXOR-XR) 150 MG XR capsule TAKE 1 CAPSULE BY MOUTH DAILY WITH BREAKFAST.    Marland Kitchen verapamil (VERELAN PM) 300 mg ER capsule Take 300 mg by mouth nightly.    Marland Kitchen VICTOZA 3-PAK 0.6 mg/0.1 mL (18 mg/3 mL) pen injector INJECT 0.3 MLS SUBCUTANEOUSLY ONCE DAILY AT BEDTIME 9 mL 5   No current facility-administered medications for this visit.           Family History  Problem Relation Age of Onset  . Pancreatic cancer Mother   . Myocardial Infarction (Heart attack) Father   . Brain cancer Sister   . Colon cancer Brother   .  No Known Problems Son   . Inflammatory bowel disease Daughter           Review of Systems  Constitutional: Negative for chills and fever.  Respiratory: Negative for cough.   Gastrointestinal: Negative for abdominal pain.  Objective:   Physical Exam Exam conducted with a chaperone present.  Constitutional:      Appearance: Normal appearance.  Eyes:     Extraocular Movements: Extraocular movements intact.     Pupils: Pupils are equal, round, and reactive to light.  Cardiovascular:     Rate and Rhythm: Normal rate and regular rhythm.     Pulses: Normal pulses.     Heart sounds: Normal heart sounds.  Pulmonary:     Effort: Pulmonary effort is normal.     Breath sounds: Normal breath sounds.  Abdominal:     General: There is no distension.     Palpations: There is no mass.     Tenderness: There is no abdominal tenderness. There is no guarding.     Hernia: A hernia is present. Hernia is present in the right inguinal area.    Musculoskeletal:     Cervical back: Normal range of motion and neck supple.  Skin:    General: Skin is warm and dry.  Neurological:     Mental Status: She is alert and oriented to person, place, and time.  Psychiatric:        Mood and Affect: Mood normal.        Behavior: Behavior normal.    Laboratory/radiology review: CT scan of the chest abdomen and pelvis dated July 06, 2020 was independently reviewed.  Prominent loops of small bowel noted in the supine position for the scan coming through a defect in the right lower quadrant.  No mesenteric abnormality noted.  Vasculature appeared normal.  The patient CBC was normal 2 years ago.  She reached a nadir of her hemoglobin at 9.63 months ago with an MCV of 73.  Now improved to a hemoglobin of 11.3 with iron supplements.  Normal white blood cell count and platelet count.  Low serum iron stores.  Serum B12 level was low at 170 610 months ago.  Now normal with  supplementation.  Colonoscopy of December 08, 2019 and upper endoscopy of July 11, 2020 documents were reviewed.  Biopsies were benign.    Assessment:     Asymptomatic right inguinal hernia with small bowel contents present.    Plan:     The patient was unaware of the hernia and is asymptomatic in its regard.  While she had reported some abdominal cramping, she had never had pain in the right lower quadrant.  The only prior abdominal procedure was tubal ligation.  Considering the fact that she has small bowel contents within the defect at rest I am concerned that she is at higher than average risk for incarceration.  As her general health is good, I think she would be well served by elective inguinal hernia repair.  Indications for mesh placement were reviewed.  We discussed the various approaches to hernia repair including open technique (my preference) and laparoscopic/robotic repair.  She is amenable to have an open repair completed.  She would like to defer surgery until the vacation season is over and it is tentatively scheduled for September 27, 2020.    Patient to follow up as scheduled and is aware to call for any new issues or concerns.  Entered by Wendall Stade, CMA, acting as a scribe for Dr. Donnalee Curry, MD.  Entered by Dorathy Daft, RN, acting as a scribe for Dr. Donnalee Curry, MD.   The documentation recorded by the scribe accurately reflects the service I personally performed and the decisions made by me.   Earline Mayotte,  MD FACS      Electronically signed by Mayer Masker, MD on 08/08/2020 8:57 PM

## 2020-08-10 ENCOUNTER — Encounter: Payer: Self-pay | Admitting: Physician Assistant

## 2020-08-10 ENCOUNTER — Other Ambulatory Visit: Payer: Self-pay

## 2020-08-10 ENCOUNTER — Ambulatory Visit: Payer: No Typology Code available for payment source | Admitting: Physician Assistant

## 2020-08-10 VITALS — BP 124/74 | HR 80 | Ht 63.0 in | Wt 191.2 lb

## 2020-08-10 DIAGNOSIS — R195 Other fecal abnormalities: Secondary | ICD-10-CM

## 2020-08-10 DIAGNOSIS — R0600 Dyspnea, unspecified: Secondary | ICD-10-CM

## 2020-08-10 DIAGNOSIS — Z794 Long term (current) use of insulin: Secondary | ICD-10-CM

## 2020-08-10 DIAGNOSIS — R0609 Other forms of dyspnea: Secondary | ICD-10-CM

## 2020-08-10 DIAGNOSIS — K219 Gastro-esophageal reflux disease without esophagitis: Secondary | ICD-10-CM

## 2020-08-10 DIAGNOSIS — I10 Essential (primary) hypertension: Secondary | ICD-10-CM

## 2020-08-10 DIAGNOSIS — E785 Hyperlipidemia, unspecified: Secondary | ICD-10-CM

## 2020-08-10 DIAGNOSIS — E1169 Type 2 diabetes mellitus with other specified complication: Secondary | ICD-10-CM | POA: Diagnosis not present

## 2020-08-10 DIAGNOSIS — I493 Ventricular premature depolarization: Secondary | ICD-10-CM | POA: Diagnosis not present

## 2020-08-10 DIAGNOSIS — J45909 Unspecified asthma, uncomplicated: Secondary | ICD-10-CM

## 2020-08-10 DIAGNOSIS — G4733 Obstructive sleep apnea (adult) (pediatric): Secondary | ICD-10-CM

## 2020-08-10 NOTE — Progress Notes (Signed)
Office Visit    Patient Name: Vanessa Higgins Date of Encounter: 08/10/2020  Primary Care Provider:  Burnard Hawthorne, FNP Primary Cardiologist:  [name], MD  Chief Complaint    Chief Complaint  Patient presents with  . office visit    3 month F/U; Meds verbally reviewed with patient.   69 year old female with history of frequent PVCs, hypertension, hyperlipidemia, DM2, asthma, OSA, GERD, and who presents for 71-month follow-up.  Past Medical History    Past Medical History:  Diagnosis Date  . Allergy    hay fever  . Anemia   . Ankle fracture, left 04/08/2014  . Anxiety and depression   . Arthritis   . Asthma   . Blood transfusion abn reaction or complication, no procedure mishap   . Blood transfusion without reported diagnosis   . Depression   . Diabetes mellitus   . Disc displacement, lumbar   . Fatty liver   . GERD (gastroesophageal reflux disease)   . Headache(784.0)   . Hyperlipidemia   . Hypertension   . Joint pain   . Low vitamin B12 level   . Low vitamin D level   . Migraine   . Mitral valve prolapse   . Obstructive sleep apnea    CPAP  . Osteoarthritis   . Palpitations   . PONV (postoperative nausea and vomiting)   . Sleep apnea 04/06/2017   uses CPAP  . SOB (shortness of breath)   . UTI (lower urinary tract infection)   . Vitamin B 12 deficiency   . Vitamin D deficiency   . Wears glasses    Past Surgical History:  Procedure Laterality Date  . APPENDECTOMY  1977  . COLONOSCOPY  last 11/17/2014  . KNEE ARTHROPLASTY Right 03/03/2019   Procedure: COMPUTER ASSISTED TOTAL KNEE ARTHROPLASTY-RIGHT;  Surgeon: Dereck Leep, MD;  Location: [facility];  Service: Orthopedics;  Laterality: Right;  . LUMBAR LAMINECTOMY/DECOMPRESSION MICRODISCECTOMY Right 02/06/2018   Procedure: Lumbar two Hemilaminectomy for Discectomy;  Surgeon: Ashok Pall, MD;  Location: [place];  Service: Neurosurgery;  Laterality: Right;  . ORIF ANKLE FRACTURE Left 04/08/2014    Procedure: OPEN REDUCTION INTERNAL FIXATION (ORIF) ANKLE FRACTURE LEFT ANKLE FRACTURE OPEN TREATMENT BIMALLEOLAR ANKLE INCLUDES INTERNAL FIXATION;  Surgeon: Johnny Bridge, MD;  Location: [place];  Service: Orthopedics;  Laterality: Left;  . PAROTIDECTOMY  1980   left  . POLYPECTOMY    . TONSILLECTOMY AND ADENOIDECTOMY     as a child  . 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 TUBAL LIGATION  05/1976    Allergies  Allergies  Allergen Reactions  . Sulfa Antibiotics Hives, Shortness Of Breath and Rash  . Lisinopril   . Ramipril Cough    History of Present Illness    [name] is a 69 y.o. female with PMH as above. She underwent Holter monitoring 08/2016 that showed sinus rhythm with ventricular ectopy.  08/2016 stress testing was without significant ischemia, EF 55%, and ruled low risk.  She was seen via virtual visit in 2020, at which time she is feeling well and noted only rare palpitations with associated symptoms.  No medication changes or additional testing.    At follow-up 05/04/20, she was experiencing progressive shortness of breath over the last few months.  Her symptoms began with cough and chest congestion around Christmas.  The symptoms progressed to worsening DOE over the last month prior to her appointment.  No shortness of  breath at rest.  She had occasionally woken up at night feeling short of breath with her heart racing despite using her CPAP.  BP was noted to be typically less than 130/80.  She had recently been seen by Dr. Duwayne Heck with changes made to her inhalers; however, she had yet to start her new medications.  She was scheduled for PFTs with results showing air trapping and hyperinflation with clinical correlation advised and outpatient pulmonary consultation if needed.  Suspicion was for underlying pulmonary process.  However, given her PVCs, further work-up with echo was recommended to rule out cardiomyopathy and showed normal pump function  with diastolic dysfunction.  Mild to moderate mitral and aortic valve regurgitation was also noted.  Recommendation was for continued BP monitoring.  It was noted that if her blood pressure was consistently elevated, she should let us know.  Medication changes were deferred.   Today, she returns to clinic and doing well from a cardiac standpoint. She denies chest pain, pnd, orthopnea, n, v, dizziness, syncope, edema, weight gain, or early satiety.  Since starting her new inhaler, she reports significant improvement in her dyspnea. She reports the rare palpitations but not concerning to her.  BP today is well controlled at 120/74.  She reports home blood pressure of SBP 130 or lower and DBP 60s to 70s.  She reports a recent positive fecal occult blood test for which she underwent work-up.  She has been trying to lose weight.  She reports walking around her yard at least 3-4 times for activity and doing so 3-4 times a week.  She denies any symptoms consistent with claudication.  Recent labs show LDL well controlled.  She is monitoring her blood sugars closely and reports that she may be able to come off her [place] soon.  She reports CPAP compliance.  She has upcoming hernia repair next month September 2021.  Home Medications    Prior to Admission medications   Medication Sig Start Date End Date Taking? Authorizing Provider  albuterol (PROVENTIL HFA;VENTOLIN HFA) 108 (90 Base) MCG/ACT inhaler Inhale 2 puffs into the lungs every 6 (six) hours as needed for wheezing or shortness of breath.   Yes [provider]  aspirin 81 MG tablet Take 81 mg by mouth daily.   Yes [provider]  atorvastatin (LIPITOR) 20 MG tablet Take 1 tablet (20 mg total) by mouth daily. 03/28/20  Yes Burnard Hawthorne, FNP  b complex vitamins tablet Take 1 tablet by mouth daily.   Yes [provider]  cholecalciferol (VITAMIN D3) 25 MCG (1000 UT) tablet Take 1,000 Units by mouth daily.   Yes [provider]  ferrous sulfate 325 (65 FE) MG EC tablet Take 1 tablet (325 mg total) by mouth 2 (two) times daily with a meal. 05/30/20  Yes Arnett, Yvetta Coder, FNP  Fluticasone-Umeclidin-Vilant (TRELEGY ELLIPTA) 200-62.5-25 MCG/INH AEPB Inhale 1 puff into the lungs daily. 05/01/20  Yes Tyler Pita, MD  glucose blood (FREESTYLE LITE) test strip USE AS INSTRUCTED TO CHECK SUGAR UP TO 3 TIMES DAILY 06/02/20  Yes Crecencio Mc, MD  insulin degludec (TRESIBA FLEXTOUCH) 100 UNIT/ML FlexTouch Pen Inject 6 Units into the skin at bedtime.   Yes [provider]  Insulin Pen Needle (BD PEN NEEDLE NANO U/F) 32G X 4 MM MISC USE TWICE A DAY AS DIRECTED WITH DIABETES MEDICATION PENS 09/23/19  Yes Arnett, Yvetta Coder, FNP  JARDIANCE 25 MG TABS tablet TAKE 1 TABLET BY MOUTH ONCE DAILY  05/26/20  Yes Arnett, Yvetta Coder, FNP  loratadine (CLARITIN) 10 MG tablet Take 10 mg by mouth daily.   Yes [provider]  losartan (COZAAR) 25 MG tablet TAKE 1 TABLET BY MOUTH DAILY. 03/01/20  Yes Burnard Hawthorne, FNP  meclizine (ANTIVERT) 25 MG tablet Take 1 tablet (25 mg total) by mouth 3 (three) times daily as needed for dizziness. 03/22/17  Yes Veronese, [place], MD  metFORMIN (GLUCOPHAGE) 1000 MG tablet TAKE 1 TABLET BY MOUTH 2 TIMES DAILY WITH A MEAL. 11/03/19  Yes Burnard Hawthorne, FNP  montelukast (SINGULAIR) 10 MG tablet TAKE 1 TABLET BY MOUTH AT BEDTIME 07/11/20  Yes Arnett, Yvetta Coder, FNP  Multiple Vitamins-Minerals (CENTRUM SILVER PO) Take 1 tablet by mouth daily.    Yes [provider]  omeprazole (PRILOSEC) 20 MG capsule TAKE 1 CAPSULE BY MOUTH DAILY. 07/11/20  Yes Burnard Hawthorne, FNP  venlafaxine XR (EFFEXOR-XR) 150 MG 24 hr capsule TAKE 1 CAPSULE BY MOUTH DAILY WITH BREAKFAST. 05/26/20  Yes Burnard Hawthorne, FNP  Verapamil HCl CR 300 MG CP24 TAKE 1 CAPSULE BY MOUTH DAILY 08/03/20  Yes Burnard Hawthorne, FNP  VICTOZA 18 MG/3ML SOPN INJECT 1.8 MG UNDER THE SKIN AT BEDTIME 05/26/20  Yes  Arnett, Yvetta Coder, FNP  Vitamin D, Ergocalciferol, (DRISDOL) 1.25 MG (50000 UNIT) CAPS capsule Take 1 capsule (50,000 Units total) by mouth every 7 (seven) days. 07/12/20  Yes [place], Dawn W, FNP  insulin degludec (TRESIBA FLEXTOUCH) 100 UNIT/ML FlexTouch Pen Inject 0.2 mLs (20 Units total) into the skin at bedtime. Patient taking differently: Inject 12 Units into the skin at bedtime.  03/22/20   Burnard Hawthorne, FNP    Review of Systems    She denies chest pain, pnd, orthopnea, n, v, dizziness, syncope, edema, weight gain, or early satiety.  She reports significantly improved dyspnea since starting her new inhaler.  She has a rare palpitation that is not concerning to her.  All other systems reviewed and are otherwise negative except as noted above.  Physical Exam    VS:  BP 124/74 (BP Location: Left Arm, Patient Position: Sitting, Cuff Size: Large)   Pulse 80   Ht 5\' 3"  (1.6 m)   Wt 191 lb 4 oz (86.8 kg)   SpO2 94%   BMI 33.88 kg/m  , BMI Body mass index is 33.88 kg/m. GEN: Well nourished, well developed, in no acute distress. HEENT: normal. Neck: Supple, no JVD, carotid bruits, or masses. Cardiac: RRR, no murmurs, rubs, or gallops. No clubbing, cyanosis, edema.  Radials/DP/PT 2+ and equal bilaterally.  Respiratory:  Respirations regular and unlabored, clear to auscultation bilaterally. GI: Soft, nontender, nondistended, BS + x 4. MS: no deformity or atrophy. Skin: warm and dry, no rash. Neuro:  Strength and sensation are intact. Psych: Normal affect.  Accessory Clinical Findings    ECG personally reviewed by me today - NSR, low voltage QRS, 80bpm, PRi 118ms, QTc 483ms, poor R wave progression V1-3 as seen in previous EKG, poor R wave progression in III, avF, baseline wander - no acute changes.  VITALS Reviewed today   Temp Readings from Last 3 Encounters:  07/27/20 98.5 F (36.9 C)  07/18/20 (!) 97.5 F (36.4 C) (Temporal)  07/12/20 98.3 F (36.8 C) (Oral)   BP  Readings from Last 3 Encounters:  08/10/20 124/74  07/27/20 100/68  07/18/20 136/80   Pulse Readings from Last 3 Encounters:  08/10/20 80  07/27/20 82  07/18/20 74  Wt Readings from Last 3 Encounters:  08/10/20 191 lb 4 oz (86.8 kg)  07/27/20 190 lb (86.2 kg)  07/18/20 195 lb (88.5 kg)     LABS  reviewed today   Lab Results  Component Value Date   WBC 6.1 07/12/2020   HGB 11.3 07/12/2020   HCT 36.8 07/12/2020   MCV 74 (L) 07/12/2020   PLT 422 07/12/2020   Lab Results  Component Value Date   CREATININE 0.59 07/12/2020   BUN 16 07/12/2020   NA 139 07/12/2020   K 4.4 07/12/2020   CL 101 07/12/2020   CO2 25 07/12/2020   Lab Results  Component Value Date   ALT 12 07/12/2020   AST 16 07/12/2020   ALKPHOS 48 07/12/2020   BILITOT 0.5 07/12/2020   Lab Results  Component Value Date   CHOL 105 07/12/2020   HDL 41 07/12/2020   LDLCALC 49 07/12/2020   TRIG 70 07/12/2020   CHOLHDL 5 03/22/2020    Lab Results  Component Value Date   HGBA1C 6.0 (H) 07/12/2020   Lab Results  Component Value Date   TSH 1.290 05/10/2020     STUDIES/PROCEDURES reviewed today   Echo 06/15/20 1. Left ventricular ejection fraction, by estimation, is 60 to 65%. The  left ventricle has normal function. The left ventricle has no regional  wall motion abnormalities. Left ventricular diastolic parameters are  consistent with Grade I diastolic  dysfunction (impaired relaxation).  2. Right ventricular systolic function is normal. The right ventricular  size is normal.  3. The mitral valve is normal in structure. Mild mitral valve  regurgitation.  4. The aortic valve was not well visualized. Aortic valve regurgitation  is mild to moderate.   Assessment & Plan    DOE --Significantly improved since starting her new inhaler. Reviewed echo as above. Continue current medications.  PVCs --Rare palpitations. Continue current medications.  HTN --BP well controlled. Continue current  medications.   HLD  --Most recent labs with LDL at goal. Continue current statin.  Medication changes: None Labs ordered: None Studies / Imaging ordered: None Future considerations: None Disposition: RTC 6 months    Arvil Chaco, PA-C 08/10/2020

## 2020-08-10 NOTE — Patient Instructions (Signed)
Medication Instructions:  Your physician recommends that you continue on your current medications as directed. Please refer to the Current Medication list given to you today.  *If you need a refill on your cardiac medications before your next appointment, please call your pharmacy*   Lab Work: None ordered If you have labs (blood work) drawn today and your tests are completely normal, you will receive your results only by: Marland Kitchen MyChart Message (if you have MyChart) OR . A paper copy in the mail If you have any lab test that is abnormal or we need to change your treatment, we will call you to review the results.   Testing/Procedures: None ordered   Follow-Up: At [facility], you and your health needs are our priority.  As part of our continuing mission to provide you with exceptional heart care, we have created designated Provider Care Teams.  These Care Teams include your primary Cardiologist (physician) and Advanced Practice Providers (APPs -  Physician Assistants and Nurse Practitioners) who all work together to provide you with the care you need, when you need it.  We recommend signing up for the patient portal called "MyChart".  Sign up information is provided on this After Visit Summary.  MyChart is used to connect with patients for Virtual Visits (Telemedicine).  Patients are able to view lab/test results, encounter notes, upcoming appointments, etc.  Non-urgent messages can be sent to your provider as well.   To learn more about what you can do with MyChart, go to [email or web address].    Your next appointment:   6 month(s)  The format for your next appointment:   In Person  Provider:     You may see [name], MD or Marrianne Mood, PA-C

## 2020-08-14 ENCOUNTER — Other Ambulatory Visit (INDEPENDENT_AMBULATORY_CARE_PROVIDER_SITE_OTHER): Payer: Self-pay | Admitting: Family Medicine

## 2020-08-14 DIAGNOSIS — E559 Vitamin D deficiency, unspecified: Secondary | ICD-10-CM

## 2020-08-15 ENCOUNTER — Other Ambulatory Visit (INDEPENDENT_AMBULATORY_CARE_PROVIDER_SITE_OTHER): Payer: Self-pay | Admitting: Family Medicine

## 2020-08-17 ENCOUNTER — Ambulatory Visit
Admission: RE | Admit: 2020-08-17 | Discharge: 2020-08-17 | Disposition: A | Discharge status: Home / Self Care | Payer: No Typology Code available for payment source | Source: Ambulatory Visit | Attending: Family | Admitting: Family

## 2020-08-17 ENCOUNTER — Ambulatory Visit (INDEPENDENT_AMBULATORY_CARE_PROVIDER_SITE_OTHER): Payer: No Typology Code available for payment source | Admitting: Bariatrics

## 2020-08-17 ENCOUNTER — Other Ambulatory Visit: Payer: Self-pay

## 2020-08-17 DIAGNOSIS — Z1231 Encounter for screening mammogram for malignant neoplasm of breast: Secondary | ICD-10-CM | POA: Insufficient documentation

## 2020-08-18 ENCOUNTER — Telehealth: Payer: Self-pay | Admitting: Family

## 2020-08-18 NOTE — Telephone Encounter (Signed)
Call [facility]  See mammogram report  There is odd statement there - appears like typo. Please make provider aware and addend report  I read the R for years there a thought about a probably followup Thursday with were a daughter is out school possible retracted please Insert Correct Screening Template

## 2020-08-21 ENCOUNTER — Encounter (INDEPENDENT_AMBULATORY_CARE_PROVIDER_SITE_OTHER): Payer: Self-pay | Admitting: Bariatrics

## 2020-08-21 ENCOUNTER — Other Ambulatory Visit: Payer: Self-pay

## 2020-08-21 ENCOUNTER — Ambulatory Visit (INDEPENDENT_AMBULATORY_CARE_PROVIDER_SITE_OTHER): Payer: No Typology Code available for payment source | Admitting: Bariatrics

## 2020-08-21 VITALS — BP 143/81 | HR 80 | Temp 97.9°F | Ht 63.0 in | Wt 186.0 lb

## 2020-08-21 DIAGNOSIS — E669 Obesity, unspecified: Secondary | ICD-10-CM

## 2020-08-21 DIAGNOSIS — D508 Other iron deficiency anemias: Secondary | ICD-10-CM | POA: Diagnosis not present

## 2020-08-21 DIAGNOSIS — E1169 Type 2 diabetes mellitus with other specified complication: Secondary | ICD-10-CM

## 2020-08-21 DIAGNOSIS — Z6833 Body mass index (BMI) 33.0-33.9, adult: Secondary | ICD-10-CM | POA: Diagnosis not present

## 2020-08-21 NOTE — Telephone Encounter (Signed)
I called [facility] & made Jamie aware of typo. She stated that she would make Dr. Radford Pax aware.

## 2020-08-22 ENCOUNTER — Encounter (INDEPENDENT_AMBULATORY_CARE_PROVIDER_SITE_OTHER): Payer: Self-pay | Admitting: Bariatrics

## 2020-08-22 NOTE — Progress Notes (Signed)
Chief Complaint:   OBESITY Vanessa Higgins is here to discuss her progress with her obesity treatment plan along with follow-up of her obesity related diagnoses. Vanessa Higgins is on the Category 2 Plan and states she is following her eating plan approximately 50% of the time. Vanessa Higgins states she is walking 2 miles 4-5 times per week.  Today's visit was #: 7 Starting weight: 205 lbs Starting date: 05/10/2020 Today's weight: 186 lbs Today's date: 08/21/2020 Total lbs lost to date: 19 Total lbs lost since last in-office visit: 4  Interim History: Vanessa Higgins is down an additional 4 lbs since her last visit and states she is walking more. She has been on vacation.  Subjective:   Type 2 diabetes mellitus with obesity ([place]). Vanessa Higgins is taking Jardiance, Victoza, Tresiba, and metformin. Fasting blood sugars are in the range of 110 and 120 with 2-hour postprandials in the range of 140 and 160.  Lab Results  Component Value Date   HGBA1C 6.0 (H) 07/12/2020   HGBA1C 6.9 (H) 03/22/2020   HGBA1C 7.1 (H) 10/12/2019   Lab Results  Component Value Date   MICROALBUR 1.6 03/22/2020   LDLCALC 49 07/12/2020   CREATININE 0.59 07/12/2020   No results found for: INSULIN  Other iron deficiency anemia. Lab work on 07/12/2020 showed low iron saturation and low ferritin levels. Vanessa Higgins is on an iron supplement. She has an inguinal hernia and has seen a Psychologist, sport and exercise.  CBC Latest Ref Rng & Units 07/12/2020 05/01/2020 10/12/2019  WBC 3.4 - 10.8 x10E3/uL 6.1 6.9 5.9  Hemoglobin 11.1 - 15.9 g/dL 11.3 9.6(L) 10.3 Repeated and verified X2.(L)  Hematocrit 34.0 - 46.6 % 36.8 33.7(L) 33.8(L)  Platelets 150 - 450 x10E3/uL 422 250 462.0(H)   Lab Results  Component Value Date   IRON 26 (L) 07/12/2020   TIBC 401 07/12/2020   FERRITIN 8 (L) 07/12/2020   Lab Results  Component Value Date   VITAMINB12 >2000 (H) 07/12/2020   Assessment/Plan:   Type 2 diabetes mellitus with obesity ([place]). Good blood sugar control is important to  decrease the likelihood of diabetic complications such as nephropathy, neuropathy, limb loss, blindness, coronary artery disease, and death. Intensive lifestyle modification including diet, exercise and weight loss are the first line of treatment for diabetes. Vanessa Higgins will continue her medications as directed and will continue checking her blood sugars.  Other iron deficiency anemia. Orders and follow up as documented in patient record. Vanessa Higgins will follow-up with the surgeon and have hemoglobin and hematocrit checked at that time. She will continue iron supplementation and follow-up with her PCP next month.  Counseling . Iron is essential for our bodies to make red blood cells.  Reasons that someone may be deficient include: an iron-deficient diet (more likely in those following vegan or vegetarian diets), women with heavy menses, patients with GI disorders or poor absorption, patients that have had bariatric surgery, frequent blood donors, patients with cancer, and patients with heart disease.   Marland Kitchen An iron supplement has been recommended. This is found over-the-counter.  Vanessa Higgins foods include dark leafy greens, red and white meats, eggs, seafood, and beans.   . Certain foods and drinks prevent your body from absorbing iron properly. Avoid eating these foods in the same meal as iron-rich foods or with iron supplements. These foods include: coffee, black tea, and red wine; milk, dairy products, and foods that are high in calcium; beans and soybeans; whole grains.  . Constipation can be a side effect of  iron supplementation. Increased water and fiber intake are helpful. Water goal: > 2 liters/day. Fiber goal: > 25 grams/day.  Class 1 obesity with serious comorbidity and body mass index (BMI) of 33.0 to 33.9 in adult, unspecified obesity type.  Vanessa Higgins is currently in the action stage of change. As such, her goal is to continue with weight loss efforts. She has agreed to the Category 2 Plan.   She will  work on meal planning, intentional eating, and continuing to drink adequate water.    Exercise goals: Vanessa Higgins will continue walking for exercise.  Behavioral modification strategies: increasing lean protein intake, decreasing simple carbohydrates, increasing vegetables, increasing water intake, decreasing eating out, no skipping meals, meal planning and cooking strategies, keeping healthy foods in the home and planning for success.  Vanessa Higgins has agreed to follow-up with our clinic in 3 weeks. She was informed of the importance of frequent follow-up visits to maximize her success with intensive lifestyle modifications for her multiple health conditions.   Objective:   Blood pressure (!) 143/81, pulse 80, temperature 97.9 F (36.6 C), height 5\' 3"  (1.6 m), weight 186 lb (84.4 kg), SpO2 93 %. Body mass index is 32.95 kg/m.  General: Cooperative, alert, well developed, in no acute distress. HEENT: Conjunctivae and lids unremarkable. Cardiovascular: Regular rhythm.  Lungs: Normal work of breathing. Neurologic: No focal deficits.   Lab Results  Component Value Date   CREATININE 0.59 07/12/2020   BUN 16 07/12/2020   NA 139 07/12/2020   K 4.4 07/12/2020   CL 101 07/12/2020   CO2 25 07/12/2020   Lab Results  Component Value Date   ALT 12 07/12/2020   AST 16 07/12/2020   ALKPHOS 48 07/12/2020   BILITOT 0.5 07/12/2020   Lab Results  Component Value Date   HGBA1C 6.0 (H) 07/12/2020   HGBA1C 6.9 (H) 03/22/2020   HGBA1C 7.1 (H) 10/12/2019   HGBA1C 7.3 (H) 06/10/2019   HGBA1C 6.8 (H) 02/18/2019   No results found for: INSULIN Lab Results  Component Value Date   TSH 1.290 05/10/2020   Lab Results  Component Value Date   CHOL 105 07/12/2020   HDL 41 07/12/2020   LDLCALC 49 07/12/2020   TRIG 70 07/12/2020   CHOLHDL 5 03/22/2020   Lab Results  Component Value Date   WBC 6.1 07/12/2020   HGB 11.3 07/12/2020   HCT 36.8 07/12/2020   MCV 74 (L) 07/12/2020   PLT 422 07/12/2020    Lab Results  Component Value Date   IRON 26 (L) 07/12/2020   TIBC 401 07/12/2020   FERRITIN 8 (L) 07/12/2020   Attestation Statements:   Reviewed by clinician on day of visit: allergies, medications, problem list, medical history, surgical history, family history, social history, and previous encounter notes.  Time spent on visit including pre-visit chart review and post-visit charting and care was 20 minutes.   Vanessa Higgins, am acting as Location manager for [employer], DO   I have reviewed the above documentation for accuracy and completeness, and I agree with the above. Jearld Lesch, DO

## 2020-08-29 ENCOUNTER — Ambulatory Visit: Payer: No Typology Code available for payment source | Admitting: Gastroenterology

## 2020-08-30 ENCOUNTER — Telehealth: Payer: Self-pay | Admitting: Family

## 2020-08-30 ENCOUNTER — Telehealth (HOSPITAL_COMMUNITY): Payer: Self-pay | Admitting: Nurse Practitioner

## 2020-08-30 ENCOUNTER — Telehealth: Payer: Self-pay

## 2020-08-30 ENCOUNTER — Telehealth (INDEPENDENT_AMBULATORY_CARE_PROVIDER_SITE_OTHER): Payer: No Typology Code available for payment source | Admitting: Family

## 2020-08-30 ENCOUNTER — Encounter: Payer: Self-pay | Admitting: Nurse Practitioner

## 2020-08-30 DIAGNOSIS — R319 Hematuria, unspecified: Secondary | ICD-10-CM

## 2020-08-30 DIAGNOSIS — U071 COVID-19: Secondary | ICD-10-CM

## 2020-08-30 DIAGNOSIS — N3001 Acute cystitis with hematuria: Secondary | ICD-10-CM | POA: Diagnosis not present

## 2020-08-30 DIAGNOSIS — J069 Acute upper respiratory infection, unspecified: Secondary | ICD-10-CM

## 2020-08-30 DIAGNOSIS — N390 Urinary tract infection, site not specified: Secondary | ICD-10-CM | POA: Insufficient documentation

## 2020-08-30 DIAGNOSIS — J22 Unspecified acute lower respiratory infection: Secondary | ICD-10-CM | POA: Insufficient documentation

## 2020-08-30 HISTORY — DX: COVID-19: U07.1

## 2020-08-30 MED ORDER — DEXTROMETHORPHAN-GUAIFENESIN 5-100 MG/5ML PO LIQD: 10.0000 mL | Freq: Every evening | ORAL | 0 refills | Status: DC | PRN

## 2020-08-30 NOTE — Patient Instructions (Signed)
Please start Mucinex DM to take at bedtime for cough.     Please begin Vit C 1000 mg daily   Vitamin D3 4000 IU daily   Zinc 100 g daily   Quercetin 250 mg -500 mg twice daily    Rest, hydrate very well, eat healthy protein food, Tylenol or Advil as directed .    Please go to Acute Care if symptoms worsen but are not an emergency. Office video visit again early next week.    Remain in self quarantine until at least 10 days since symptom onset AND 3 consecutive days fever free without antipyretics AND improvement in respiratory symptoms.    Utilize over the counter medications to treat symptoms.   Seek  treatment in the ED if respiratory issues/distress develops or unrelieved chest pain. Or, if you become severely weak, dehydrated, confused.    Only leave home to seek medical care and must wear a mask in public. Limit contact with family members or caregivers in the home and to notify her family who she was with yesterday that they should quarantine for 14 days. Practice social distancing and to continue to use good preventative care measures such has frequent hand washing.      Your COVID-19 test was resulted as positive.    You should continue your self imposed quantarine until you can answer "yes" to ALL 3 of the conditions below:   1) Your symptoms (fever, cough, shortness of breath) started 7 or more days ago   2) Your body temperature  has been normal for at least 72 hours (WITHOUT the use of any tylenol, motrin or aleve) . Normal is < 100.4 Farenheit  3) your other flu  like symptoms symptoms are getting better.   YOUR FAMILY or other household contacts, however , even if they feel fine , will need to continue to quarantining themselves  (that means no contact with ANYONE outside of the house)  for 14 days STARTING from the end of your initial 7 day period . (why? because they have been theoretically exposed to you during the entire 7 days of your illness, and they can sheD  the virus without symptoms for that period of time ).         10 Things You Can Do to Manage Your COVID-19 Symptoms at Home If you have possible or confirmed COVID-19: 1. Stay home from work and school. And stay away from other public places. If you must go out, avoid using any kind of public transportation, ridesharing, or taxis. 2. Monitor your symptoms carefully. If your symptoms get worse, call your healthcare provider immediately. 3. Get rest and stay hydrated. 4. If you have a medical appointment, call the healthcare provider ahead of time and tell them that you have or may have COVID-19. 5. For medical emergencies, call 911 and notify the dispatch personnel that you have or may have COVID-19. 6. Cover your cough and sneezes with a tissue or use the inside of your elbow. 7. Wash your hands often with soap and water for at least 20 seconds or clean your hands with an alcohol-based hand sanitizer that contains at least 60% alcohol. 8. As much as possible, stay in a specific room and away from other people in your home. Also, you should use a separate bathroom, if available. If you need to be around other people in or outside of the home, wear a mask. 9. Avoid sharing personal items with other people in your household,  like dishes, towels, and bedding. 10. Clean all surfaces that are touched often, like counters, tabletops, and doorknobs. Use household cleaning sprays or wipes according to the label instructions. [email or web address] 06/30/2019

## 2020-08-30 NOTE — Assessment & Plan Note (Addendum)
Speaking well on phone, not labored in speech. BP at baseline. Occasional wet sounding cough. Febrile. Concern for covid and false negative antigen test on first day of symptom onset. Left message for monoclonal antibody infusion as due to co morbidities, age, and window as symptoms started 5 days ago. She is eating and drinking and feels safe to be home. Advised to quarantine for now. Advised of vitamins and cough syrup and to seek immediate medical attention for worsening of symptoms, sob. She verbalized understanding of all.

## 2020-08-30 NOTE — Telephone Encounter (Signed)
Pt called at 10:33 this morning about her appt. I went to the back and they stated that they sent multiple text and called and no answer  Pt daugther called back and said that no one had contacted her mother.  Pt daughter also stated that she has been in bed since Saturday with a fever

## 2020-08-30 NOTE — Telephone Encounter (Signed)
Noted. Vanessa Higgins has spoken with patient.

## 2020-08-30 NOTE — Telephone Encounter (Signed)
Called to Discuss with patient about Covid symptoms and the use of regeneron, a monoclonal antibody infusion for those with mild to moderate Covid symptoms and at a high risk of hospitalization.     Pt is qualified for this infusion at the [facility] infusion center due to co-morbid conditions and/or a member of an at-risk group.     Unable to reach pt. Left message to return call. [place], [place], AGNP-C [phone] ([place])

## 2020-08-30 NOTE — Telephone Encounter (Signed)
I spoke with patient's daughter who was very upset that she had thought no one reached out to her mom for her VV this morning. I explained that I had sent the text twice to her mom's cell to join for visit. Then I called patient & LM bc it was unlike patient to no show. [place] & it may have been a reception issue.   Daughter is very worried about her mother bc she has been very sick since Friday. She went to [facility] Saturday & had a UTI. She is being treated for 7 days with antibiotic. She is however very weak & now bleeding when she urinates. She also still has low grade fever. She has been in the bed since Friday & is to weak to get out.   I advised that Joycelyn Schmid will call to speak with patient & daughter

## 2020-08-30 NOTE — Telephone Encounter (Signed)
noted 

## 2020-08-30 NOTE — Assessment & Plan Note (Signed)
hematuria, urinary incontinence exacerbated by frequent cough. Per patient appears to be on appropriate agent, ciprofloxacin. Will recheck UA, I have ordered. Advised urology consult due to recurrent nature.

## 2020-08-30 NOTE — Progress Notes (Signed)
Verbal consent for services obtained from patient prior to services given to TELEPHONE visit:   Location of call:  provider at work patient at home  Names of all persons present for services: Mable Paris, NP and patient Chief complaint:  Primary symptom is constant productive cough 5 days ago, describes as constant.  SOB when coughing. NO sob with activity. No CP, leg swelling, nasal congestion, sinus pressure, ear pain, sore throat.   BP today 128/68, 100.4 F today. Max 100.8 Max. Taking around the clock tylenol, nasonex.  Nausea has resolved. Eating and drinking well.  10 days ago when at work was nauseated, worsened the next day which is when daughter took her to urgent care. Negative covid test with antigen at urgent care 5 days ago At urgent care, PA told her ' she was septic as vital signs were so low.' Blood pressure 74/ over something, HR 109, sa 94%. 100.4 F at that time.   Given rocephin for UTI at urgent care. No urinary symptoms at urgent care, they checked urine due to UTI recurrent history.  Found out yesterday from urgent care had an 'e coli UTI' and started on ciprofloxacin bid x 7 days. Told urine culture sensitive to cipro. No dysuria.  Endorses urinary incontinence, worse with cough, hematuria. No large clots.   Works in endoscopy  H/o asthma- using trelegy daily. 'Hasnt used albuterol much' with cough. No wheezing.   covid vaccinated  History, background, results pertinent:  BP Readings from Last 3 Encounters:  08/21/20 (!) 143/81  08/10/20 124/74  07/27/20 100/68     A/P/next steps:  Problem List Items Addressed This Visit      Respiratory   URI (upper respiratory infection) - Primary    Speaking well on phone, not labored in speech. BP at baseline. Occasional wet sounding cough. Febrile. Concern for covid and false negative antigen test on first day of symptom onset. Left message for monoclonal antibody infusion as due to co morbidities, age, and window  as symptoms started 5 days ago. She is eating and drinking and feels safe to be home. Advised to quarantine for now. Advised of vitamins and cough syrup and to seek immediate medical attention for worsening of symptoms, sob. She verbalized understanding of all.       Relevant Medications   Dextromethorphan-guaiFENesin 5-100 MG/5ML LIQD     Genitourinary   UTI (urinary tract infection)     hematuria, urinary incontinence exacerbated by frequent cough. Per patient appears to be on appropriate agent, ciprofloxacin. Will recheck UA, I have ordered. Advised urology consult due to recurrent nature.          I spent 21 min  discussing plan of care over the phone.

## 2020-08-30 NOTE — Telephone Encounter (Signed)
Call pt Does she have covid test sch? Has she heard from [place] [facility]?  Also please sch her for UA in 6 weeks to ensure blood cleared.  Also, im concerned with recurrent nature of utis and dont see that she is seeing urology. Can I make a referral?

## 2020-08-30 NOTE — Telephone Encounter (Signed)
Just FYI patient is going for Covid testing & she would like to see urology. She wants to get well first & she will let us know or mychart [name] when she would like referral placed.

## 2020-09-01 ENCOUNTER — Other Ambulatory Visit: Payer: Self-pay | Admitting: Family

## 2020-09-01 DIAGNOSIS — I10 Essential (primary) hypertension: Secondary | ICD-10-CM

## 2020-09-02 ENCOUNTER — Telehealth: Payer: Self-pay | Admitting: Nurse Practitioner

## 2020-09-02 NOTE — Telephone Encounter (Signed)
Called to discuss with Vanessa Higgins about Covid symptoms and the use of casirivimab/imdevimab, a combination monoclonal antibody infusion for those with mild to moderate Covid symptoms and at a high risk of hospitalization.     Pt is qualified for this infusion at the [facility] infusion center due to co-morbid conditions (as indicated below)   Re-attempted to call patient. Voicemail left.    Patient Active Problem List   Diagnosis Date Noted  . URI (upper respiratory infection) 08/30/2020  . UTI (urinary tract infection)   . Hyperlipidemia associated with type 2 diabetes mellitus ([place]) 07/14/2020  . Low serum vitamin B12 07/14/2020  . Dyspnea on exertion 05/05/2020  . Dysuria 03/22/2020  . Left foot pain 09/01/2019  . H/O adenomatous polyp of colon 03/03/2019  . Total knee replacement status 03/03/2019  . Depression, major, single episode, complete remission ([place]) 11/18/2018  . HNP (herniated nucleus pulposus), lumbar 02/06/2018  . Fatty liver disease, nonalcoholic 57/26/2035  . GERD (gastroesophageal reflux disease) 10/15/2017  . PVC's (premature ventricular contractions) 08/01/2017  . OSA (obstructive sleep apnea) 04/21/2017  . Vertigo 03/26/2017  . Elevated liver enzymes 12/03/2016  . Cough 06/05/2016  . Irregular heart rate 06/05/2016  . Vitamin D deficiency 12/02/2014  . IDA (iron deficiency anemia) 10/20/2014  . Routine general medical examination at a health care facility 05/03/2013  . Diabetes mellitus ([place]) 07/09/2012  . Essential hypertension 07/09/2012  . Hyperlipidemia LDL goal <70 07/09/2012  . Asthma 07/09/2012  . Class 1 obesity due to excess calories with body mass index (BMI) of 33.0 to 33.9 in adult 07/09/2012    Alda Lea, AGPCNP-BC

## 2020-09-03 ENCOUNTER — Other Ambulatory Visit: Payer: Self-pay | Admitting: Nurse Practitioner

## 2020-09-03 ENCOUNTER — Encounter: Payer: Self-pay | Admitting: Nurse Practitioner

## 2020-09-03 ENCOUNTER — Telehealth: Payer: Self-pay | Admitting: Nurse Practitioner

## 2020-09-03 DIAGNOSIS — U071 COVID-19: Secondary | ICD-10-CM

## 2020-09-03 DIAGNOSIS — I10 Essential (primary) hypertension: Secondary | ICD-10-CM

## 2020-09-03 DIAGNOSIS — E1169 Type 2 diabetes mellitus with other specified complication: Secondary | ICD-10-CM

## 2020-09-03 DIAGNOSIS — Z794 Long term (current) use of insulin: Secondary | ICD-10-CM

## 2020-09-03 NOTE — Telephone Encounter (Signed)
I connected by phone with Vanessa Higgins on 09/03/2020 at 1:21 PM to discuss the potential use of a new treatment for mild to moderate COVID-19 viral infection in non-hospitalized patients.  This patient is a 69 y.o. female that meets the FDA criteria for Emergency Use Authorization of COVID monoclonal antibody casirivimab/imdevimab.  Has a (+) direct SARS-CoV-2 viral test result  Has mild or moderate COVID-19   Is NOT hospitalized due to COVID-19  Is within 10 days of symptom onset  Has at least one of the high risk factor(s) for progression to severe COVID-19 and/or hospitalization as defined in EUA.  Specific high risk criteria : Older age (>/= 71 yo); BMI; HTN; DM   I have spoken and communicated the following to the patient or parent/caregiver regarding COVID monoclonal antibody treatment:  1. FDA has authorized the emergency use for the treatment of mild to moderate COVID-19 in adults and pediatric patients with positive results of direct SARS-CoV-2 viral testing who are 42 years of age and older weighing at least 40 kg, and who are at high risk for progressing to severe COVID-19 and/or hospitalization.  2. The significant known and potential risks and benefits of COVID monoclonal antibody, and the extent to which such potential risks and benefits are unknown.  3. Information on available alternative treatments and the risks and benefits of those alternatives, including clinical trials.  4. Patients treated with COVID monoclonal antibody should continue to self-isolate and use infection control measures (e.g., wear mask, isolate, social distance, avoid sharing personal items, clean and disinfect "high touch" surfaces, and frequent handwashing) according to CDC guidelines.   5. The patient or parent/caregiver has the option to accept or refuse COVID monoclonal antibody treatment.  After reviewing this information with the patient, The patient agreed to proceed with receiving  casirivimab\imdevimab infusion and will be provided a copy of the Fact sheet prior to receiving the infusion.   [name], NP 09/03/2020 1:21 PM

## 2020-09-03 NOTE — Progress Notes (Signed)
I connected by phone with Vanessa Higgins on 09/03/2020 at 1:32 PM to discuss the potential use of a new treatment for mild to moderate COVID-19 viral infection in non-hospitalized patients.  This patient is a 69 y.o. female that meets the FDA criteria for Emergency Use Authorization of COVID monoclonal antibody casirivimab/imdevimab.  Has a (+) direct SARS-CoV-2 viral test result  Has mild or moderate COVID-19   Is NOT hospitalized due to COVID-19  Is within 10 days of symptom onset  Has at least one of the high risk factor(s) for progression to severe COVID-19 and/or hospitalization as defined in EUA.  Specific high risk criteria : Older age (>/= 71 yo); HTN;DM;BMI>25   I have spoken and communicated the following to the patient or parent/caregiver regarding COVID monoclonal antibody treatment:  1. FDA has authorized the emergency use for the treatment of mild to moderate COVID-19 in adults and pediatric patients with positive results of direct SARS-CoV-2 viral testing who are 13 years of age and older weighing at least 40 kg, and who are at high risk for progressing to severe COVID-19 and/or hospitalization.  2. The significant known and potential risks and benefits of COVID monoclonal antibody, and the extent to which such potential risks and benefits are unknown.  3. Information on available alternative treatments and the risks and benefits of those alternatives, including clinical trials.  4. Patients treated with COVID monoclonal antibody should continue to self-isolate and use infection control measures (e.g., wear mask, isolate, social distance, avoid sharing personal items, clean and disinfect "high touch" surfaces, and frequent handwashing) according to CDC guidelines.   5. The patient or parent/caregiver has the option to accept or refuse COVID monoclonal antibody treatment.  After reviewing this information with the patient, The patient agreed to proceed with receiving  casirivimab\imdevimab infusion and will be provided a copy of the Fact sheet prior to receiving the infusion.   [name], NP 09/03/2020 1:32 PM

## 2020-09-04 ENCOUNTER — Ambulatory Visit (HOSPITAL_COMMUNITY)
Admission: RE | Admit: 2020-09-04 | Discharge: 2020-09-04 | Disposition: A | Discharge status: Home / Self Care | Payer: No Typology Code available for payment source | Source: Ambulatory Visit | Attending: Pulmonary Disease | Admitting: Pulmonary Disease

## 2020-09-04 DIAGNOSIS — U071 COVID-19: Secondary | ICD-10-CM

## 2020-09-04 DIAGNOSIS — E1169 Type 2 diabetes mellitus with other specified complication: Secondary | ICD-10-CM

## 2020-09-04 DIAGNOSIS — Z794 Long term (current) use of insulin: Secondary | ICD-10-CM | POA: Insufficient documentation

## 2020-09-04 DIAGNOSIS — I10 Essential (primary) hypertension: Secondary | ICD-10-CM

## 2020-09-04 MED ORDER — EPINEPHRINE 0.3 MG/0.3ML IJ SOAJ: 0.3000 mg | Freq: Once | INTRAMUSCULAR | Status: DC | PRN

## 2020-09-04 MED ORDER — ALBUTEROL SULFATE HFA 108 (90 BASE) MCG/ACT IN AERS: 2.0000 | INHALATION_SPRAY | Freq: Once | RESPIRATORY_TRACT | Status: DC | PRN

## 2020-09-04 MED ORDER — FAMOTIDINE IN NACL 20-0.9 MG/50ML-% IV SOLN: 20.0000 mg | Freq: Once | INTRAVENOUS | Status: DC | PRN

## 2020-09-04 MED ORDER — DIPHENHYDRAMINE HCL 50 MG/ML IJ SOLN: 50.0000 mg | Freq: Once | INTRAMUSCULAR | Status: DC | PRN

## 2020-09-04 MED ORDER — SODIUM CHLORIDE 0.9 % IV SOLN: INTRAVENOUS | Status: DC | PRN

## 2020-09-04 MED ORDER — METHYLPREDNISOLONE SODIUM SUCC 125 MG IJ SOLR: 125.0000 mg | Freq: Once | INTRAMUSCULAR | Status: DC | PRN

## 2020-09-04 MED ORDER — SODIUM CHLORIDE 0.9 % IV SOLN
1200.0000 mg | Freq: Once | INTRAVENOUS | Status: AC
  Administered 2020-09-04: 1200 mg via INTRAVENOUS
  Filled 2020-09-04: qty 10

## 2020-09-04 NOTE — Progress Notes (Signed)
  Diagnosis: COVID-19  Physician: Dr.  Asencion Noble  Procedure: Cov[facility] Infusion Clinic Med: casirivimab\imdevimab infusion - Provided patient with casirivimab\imdevimab fact sheet for patients, parents and caregivers prior to infusion.  Complications: No immediate complications noted.  Discharge: Discharged home   Gaye Alken 09/04/2020

## 2020-09-04 NOTE — Discharge Instructions (Signed)

## 2020-09-06 ENCOUNTER — Ambulatory Visit: Payer: No Typology Code available for payment source | Admitting: Gastroenterology

## 2020-09-11 ENCOUNTER — Ambulatory Visit (INDEPENDENT_AMBULATORY_CARE_PROVIDER_SITE_OTHER): Payer: No Typology Code available for payment source | Admitting: Bariatrics

## 2020-09-11 ENCOUNTER — Other Ambulatory Visit: Payer: Self-pay

## 2020-09-11 ENCOUNTER — Encounter (INDEPENDENT_AMBULATORY_CARE_PROVIDER_SITE_OTHER): Payer: Self-pay | Admitting: Bariatrics

## 2020-09-11 VITALS — BP 139/75 | HR 86 | Temp 98.5°F | Ht 63.0 in | Wt 181.0 lb

## 2020-09-11 DIAGNOSIS — E669 Obesity, unspecified: Secondary | ICD-10-CM

## 2020-09-11 DIAGNOSIS — E1169 Type 2 diabetes mellitus with other specified complication: Secondary | ICD-10-CM | POA: Diagnosis not present

## 2020-09-11 DIAGNOSIS — R790 Abnormal level of blood mineral: Secondary | ICD-10-CM | POA: Diagnosis not present

## 2020-09-11 DIAGNOSIS — E66811 Obesity, class 1: Secondary | ICD-10-CM

## 2020-09-11 DIAGNOSIS — Z9189 Other specified personal risk factors, not elsewhere classified: Secondary | ICD-10-CM

## 2020-09-11 DIAGNOSIS — E785 Hyperlipidemia, unspecified: Secondary | ICD-10-CM

## 2020-09-11 DIAGNOSIS — E559 Vitamin D deficiency, unspecified: Secondary | ICD-10-CM | POA: Diagnosis not present

## 2020-09-11 DIAGNOSIS — Z794 Long term (current) use of insulin: Secondary | ICD-10-CM

## 2020-09-11 DIAGNOSIS — Z6832 Body mass index (BMI) 32.0-32.9, adult: Secondary | ICD-10-CM

## 2020-09-11 MED ORDER — VITAMIN D (ERGOCALCIFEROL) 1.25 MG (50000 UNIT) PO CAPS: ORAL_CAPSULE | ORAL | 0 refills | Status: DC

## 2020-09-12 ENCOUNTER — Other Ambulatory Visit
Admission: RE | Admit: 2020-09-12 | Discharge: 2020-09-12 | Disposition: A | Discharge status: Home / Self Care | Payer: No Typology Code available for payment source | Source: Home / Self Care | Attending: Nurse Practitioner | Admitting: Nurse Practitioner

## 2020-09-12 ENCOUNTER — Ambulatory Visit
Admission: RE | Admit: 2020-09-12 | Discharge: 2020-09-12 | Disposition: A | Discharge status: Home / Self Care | Payer: No Typology Code available for payment source | Source: Ambulatory Visit | Attending: Nurse Practitioner | Admitting: Nurse Practitioner

## 2020-09-12 ENCOUNTER — Telehealth (INDEPENDENT_AMBULATORY_CARE_PROVIDER_SITE_OTHER): Payer: No Typology Code available for payment source | Admitting: Nurse Practitioner

## 2020-09-12 ENCOUNTER — Ambulatory Visit: Payer: No Typology Code available for payment source

## 2020-09-12 ENCOUNTER — Encounter: Payer: Self-pay | Admitting: Nurse Practitioner

## 2020-09-12 VITALS — BP 142/98 | HR 91 | Temp 97.7°F | Ht 63.0 in | Wt 181.0 lb

## 2020-09-12 DIAGNOSIS — R319 Hematuria, unspecified: Secondary | ICD-10-CM

## 2020-09-12 DIAGNOSIS — U071 COVID-19: Secondary | ICD-10-CM | POA: Diagnosis not present

## 2020-09-12 DIAGNOSIS — R05 Cough: Secondary | ICD-10-CM | POA: Insufficient documentation

## 2020-09-12 DIAGNOSIS — E1169 Type 2 diabetes mellitus with other specified complication: Secondary | ICD-10-CM

## 2020-09-12 DIAGNOSIS — Z794 Long term (current) use of insulin: Secondary | ICD-10-CM

## 2020-09-12 DIAGNOSIS — R059 Cough, unspecified: Secondary | ICD-10-CM

## 2020-09-12 LAB — URINALYSIS, ROUTINE W REFLEX MICROSCOPIC
Bacteria, UA: NONE SEEN
Bilirubin Urine: NEGATIVE
Glucose, UA: >=500 mg/dL — AB
Hgb urine dipstick: NEGATIVE
Ketones, ur: NEGATIVE mg/dL
Leukocytes,Ua: NEGATIVE
Nitrite: NEGATIVE
Protein, ur: NEGATIVE mg/dL
Specific Gravity, Urine: 1.027 (ref 1.005–1.030)
pH: 5 (ref 5.0–8.0)

## 2020-09-12 LAB — CBC WITH DIFFERENTIAL/PLATELET
Abs Immature Granulocytes: 0.03 10*3/uL (ref 0.00–0.07)
Basophils Absolute: 0.1 10*3/uL (ref 0.0–0.1)
Basophils Relative: 1 %
Eosinophils Absolute: 0.3 10*3/uL (ref 0.0–0.5)
Eosinophils Relative: 4 %
HCT: 43.1 % (ref 36.0–46.0)
Hemoglobin: 13 g/dL (ref 12.0–15.0)
Immature Granulocytes: 0 %
Lymphocytes Relative: 27 %
Lymphs Abs: 2.2 10*3/uL (ref 0.7–4.0)
MCH: 25 pg — ABNORMAL LOW (ref 26.0–34.0)
MCHC: 30.2 g/dL (ref 30.0–36.0)
MCV: 82.7 fL (ref 80.0–100.0)
Monocytes Absolute: 0.6 10*3/uL (ref 0.1–1.0)
Monocytes Relative: 7 %
Neutro Abs: 4.9 10*3/uL (ref 1.7–7.7)
Neutrophils Relative %: 61 %
Platelets: 452 10*3/uL — ABNORMAL HIGH (ref 150–400)
RBC: 5.21 MIL/uL — ABNORMAL HIGH (ref 3.87–5.11)
RDW: 21.8 % — ABNORMAL HIGH (ref 11.5–15.5)
Smear Review: NORMAL
WBC: 8.1 10*3/uL (ref 4.0–10.5)
nRBC: 0 % (ref 0.0–0.2)

## 2020-09-12 LAB — COMPREHENSIVE METABOLIC PANEL
ALT: 21 U/L (ref 0–44)
AST: 26 U/L (ref 15–41)
Albumin: 4 g/dL (ref 3.5–5.0)
Alkaline Phosphatase: 38 U/L (ref 38–126)
Anion gap: 13 (ref 5–15)
BUN: 12 mg/dL (ref 8–23)
CO2: 26 mmol/L (ref 22–32)
Calcium: 9.5 mg/dL (ref 8.9–10.3)
Chloride: 99 mmol/L (ref 98–111)
Creatinine, Ser: 0.53 mg/dL (ref 0.44–1.00)
GFR calc Af Amer: >60 mL/min (ref >60)
GFR calc non Af Amer: >60 mL/min (ref >60)
Glucose, Bld: 123 mg/dL — ABNORMAL HIGH (ref 70–99)
Potassium: 3.7 mmol/L (ref 3.5–5.1)
Sodium: 138 mmol/L (ref 135–145)
Total Bilirubin: 1 mg/dL (ref 0.3–1.2)
Total Protein: 8.3 g/dL — ABNORMAL HIGH (ref 6.5–8.1)

## 2020-09-12 LAB — FOLATE: Folate: 8.3 ng/mL (ref >3.0)

## 2020-09-12 NOTE — Addendum Note (Signed)
Addended by: Santiago Bur on: 09/12/2020 12:58 PM   Modules accepted: Orders

## 2020-09-12 NOTE — Progress Notes (Signed)
Virtual Visit via Video Note  This visit type was conducted due to national recommendations for restrictions regarding the COVID-19 pandemic (e.g. social distancing).  This format is felt to be most appropriate for this patient at this time.  All issues noted in this document were discussed and addressed.  No physical exam was performed (except for noted visual exam findings with Video Visits).   I connected with@ on 09/12/20 at  8:00 AM EDT by a video enabled telemedicine application or telephone and verified that I am speaking with the correct person using two identifiers. Location patient: home Location provider: work  Persons participating in the virtual visit: patient, provider  I discussed the limitations, risks, security and privacy concerns of performing an evaluation and management service by telephone and the availability of in person appointments. I also discussed with the patient that there may be a patient responsible charge related to this service. The patient expressed understanding and agreed to proceed.   Reason for visit: Follow-up for Covid infection.  HPI: This 69 year old patient with diabetes mellitus, asthma, hyperlipidemia, obesity, developed Covid type symptoms 2 weeks ago with nausea, vomiting headache, body aches,  and fever.  Her initial Covid test was negative.  She was found to have a urinary tract infection treated with Cipro that was culture sensitive.   She was diagnosed with positive Covid on September 2 and received monoclonal antibody infusion on September 6.  She has been using Mucinex 400 mg 2 tablets every 6 hours to help bring up phlegm.  She is feeling overall improved.  She still has weakness, decreased energy, and has been able to do small chores around the house.  She is coughing much less, but still has spells of frequent cough 2-3 episodes a day, with sputum production of brown/green.  She has less volume but it has not cleared.  She is considering  bronchitis.  She did utilize albuterol inhaler for wheezing twice during this illness and that most recently was on September 9.  She currently has no chest pain, tightness, headaches, sinus symptoms, and the last fever was about September 8.  She has lost 5 pounds during this Covid infection.  Patient has been involved.  Medical weight loss and now she has lost a total of 25 pounds.  She is eating and drinking well.  She did have diarrhea 3 or 4 episodes of watery stools over the weekend.  This has resolved.  Her Tyler Aas dosage has been decreased to 6 units at bedtime as her blood sugars have improved with her weight loss regimen.  She is hoping to go back to work in the next week.  ROS: See pertinent positives and negatives per HPI.  Past Medical History:  Diagnosis Date  . Allergy    hay fever  . Anemia   . Ankle fracture, left 04/08/2014  . Anxiety and depression   . Arthritis   . Asthma   . Blood transfusion abn reaction or complication, no procedure mishap   . Blood transfusion without reported diagnosis   . COVID-19 virus infection 08/2020  . Depression   . Diabetes mellitus   . Disc displacement, lumbar   . Fatty liver   . GERD (gastroesophageal reflux disease)   . Headache(784.0)   . Hyperlipidemia   . Hypertension   . Joint pain   . Low vitamin B12 level   . Low vitamin D level   . Migraine   . Mitral valve prolapse   . Obstructive sleep  apnea    CPAP  . Osteoarthritis   . Palpitations   . PONV (postoperative nausea and vomiting)   . Sleep apnea 04/06/2017   uses CPAP  . SOB (shortness of breath)   . UTI (lower urinary tract infection)   . Vitamin B 12 deficiency   . Vitamin D deficiency   . Wears glasses     Past Surgical History:  Procedure Laterality Date  . APPENDECTOMY  1977  . COLONOSCOPY  last 11/17/2014  . KNEE ARTHROPLASTY Right 03/03/2019   Procedure: COMPUTER ASSISTED TOTAL KNEE ARTHROPLASTY-RIGHT;  Surgeon: Dereck Leep, MD;  Location: [facility];   Service: Orthopedics;  Laterality: Right;  . LUMBAR LAMINECTOMY/DECOMPRESSION MICRODISCECTOMY Right 02/06/2018   Procedure: Lumbar two Hemilaminectomy for Discectomy;  Surgeon: Ashok Pall, MD;  Location: [facility];  Service: Neurosurgery;  Laterality: Right;  . ORIF ANKLE FRACTURE Left 04/08/2014   Procedure: OPEN REDUCTION INTERNAL FIXATION (ORIF) ANKLE FRACTURE LEFT ANKLE FRACTURE OPEN TREATMENT BIMALLEOLAR ANKLE INCLUDES INTERNAL FIXATION;  Surgeon: Johnny Bridge, MD;  Location: [facility];  Service: Orthopedics;  Laterality: Left;  . PAROTIDECTOMY  1980   left  . POLYPECTOMY    . TONSILLECTOMY AND ADENOIDECTOMY     as a child  . 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 TUBAL LIGATION  05/1976    Family History  Problem Relation Age of Onset  . Sudden death Mother   . Cancer Mother 11       pancreatic  . Heart disease Father 46  . Sudden death Father   . Cancer Brother        colon  . Colon cancer Brother 85  . Stroke Maternal Grandmother   . Heart disease Maternal Grandfather   . Cancer Sister        brain  . Breast cancer Neg Hx   . Colon polyps Neg Hx   . Esophageal cancer Neg Hx   . Rectal cancer Neg Hx   . Stomach cancer Neg Hx   . Thyroid cancer Neg Hx     SOCIAL HX: Never smoked.   Current Outpatient Medications:  .  albuterol (PROVENTIL HFA;VENTOLIN HFA) 108 (90 Base) MCG/ACT inhaler, Inhale 2 puffs into the lungs every 6 (six) hours as needed for wheezing or shortness of breath., Disp: , Rfl:  .  aspirin 81 MG tablet, Take 81 mg by mouth daily. , Disp: , Rfl:  .  atorvastatin (LIPITOR) 20 MG tablet, Take 1 tablet (20 mg total) by mouth daily., Disp: 90 tablet, Rfl: 1 .  b complex vitamins tablet, Take 1 tablet by mouth daily., Disp: , Rfl:  .  cholecalciferol (VITAMIN D3) 25 MCG (1000 UT) tablet, Take 1,000 Units by mouth daily., Disp: , Rfl:  .  Dextromethorphan-guaiFENesin 5-100 MG/5ML LIQD, Take 10 mLs by mouth at bedtime as needed (for  cough)., Disp: 118 mL, Rfl: 0 .  docusate sodium (COLACE) 100 MG capsule, Take 100 mg by mouth daily. , Disp: , Rfl:  .  ferrous sulfate 325 (65 FE) MG EC tablet, Take 1 tablet (325 mg total) by mouth 2 (two) times daily with a meal., Disp: 60 tablet, Rfl: 2 .  Fluticasone-Umeclidin-Vilant (TRELEGY ELLIPTA) 200-62.5-25 MCG/INH AEPB, Inhale 1 puff into the lungs daily., Disp: 60 each, Rfl: 6 .  glucose blood (FREESTYLE LITE) test strip, USE AS INSTRUCTED TO CHECK SUGAR UP TO 3 TIMES DAILY, Disp: 100 strip, Rfl: 12 .  insulin degludec (TRESIBA FLEXTOUCH) 100  UNIT/ML FlexTouch Pen, Inject 0.2 mLs (20 Units total) into the skin at bedtime. (Patient taking differently: Inject 6 Units into the skin at bedtime. ), Disp: 15 mL, Rfl: 2 .  Insulin Pen Needle (BD PEN NEEDLE NANO U/F) 32G X 4 MM MISC, USE TWICE A DAY AS DIRECTED WITH DIABETES MEDICATION PENS, Disp: 200 each, Rfl: 5 .  JARDIANCE 25 MG TABS tablet, TAKE 1 TABLET BY MOUTH ONCE DAILY (Patient taking differently: Take 25 mg by mouth daily. ), Disp: 90 tablet, Rfl: 1 .  loratadine (CLARITIN) 10 MG tablet, Take 10 mg by mouth daily., Disp: , Rfl:  .  losartan (COZAAR) 25 MG tablet, TAKE 1 TABLET BY MOUTH DAILY. (Patient taking differently: Take 25 mg by mouth daily. ), Disp: 90 tablet, Rfl: 1 .  meclizine (ANTIVERT) 25 MG tablet, Take 1 tablet (25 mg total) by mouth 3 (three) times daily as needed for dizziness., Disp: 30 tablet, Rfl: 0 .  metFORMIN (GLUCOPHAGE) 1000 MG tablet, TAKE 1 TABLET BY MOUTH 2 TIMES DAILY WITH A MEAL. (Patient taking differently: Take 1,000 mg by mouth 2 (two) times daily with a meal. ), Disp: 180 tablet, Rfl: 3 .  montelukast (SINGULAIR) 10 MG tablet, TAKE 1 TABLET BY MOUTH AT BEDTIME (Patient taking differently: Take 10 mg by mouth at bedtime. ), Disp: 90 tablet, Rfl: 1 .  Multiple Vitamins-Minerals (CENTRUM SILVER PO), Take 1 tablet by mouth daily. , Disp: , Rfl:  .  omeprazole (PRILOSEC) 20 MG capsule, TAKE 1 CAPSULE BY  MOUTH DAILY. (Patient taking differently: Take 20 mg by mouth daily. ), Disp: 90 capsule, Rfl: 0 .  venlafaxine XR (EFFEXOR-XR) 150 MG 24 hr capsule, TAKE 1 CAPSULE BY MOUTH DAILY WITH BREAKFAST. (Patient taking differently: Take 150 mg by mouth daily with breakfast. ), Disp: 90 capsule, Rfl: 1 .  Verapamil HCl CR 300 MG CP24, TAKE 1 CAPSULE BY MOUTH DAILY (Patient taking differently: Take 300 mg by mouth daily. ), Disp: 90 capsule, Rfl: 1 .  VICTOZA 18 MG/3ML SOPN, INJECT 1.8 MG UNDER THE SKIN AT BEDTIME (Patient taking differently: Inject 1.8 mg into the skin at bedtime. ), Disp: 9 mL, Rfl: 3 .  Vitamin D, Ergocalciferol, (DRISDOL) 1.25 MG (50000 UNIT) CAPS capsule, TAKE 1 CAPSULE BY MOUTH EVERY 7 DAYS., Disp: 4 capsule, Rfl: 0  EXAM:  VITALS per patient if applicable:Wt 181 lbs, 97.7, 91, 142/98  GENERAL: alert, oriented, appears fatigued and in no acute distress  HEENT: atraumatic, conjunctiva clear, no obvious abnormalities on inspection of external nose and ears  NECK: normal movements of the head and neck  LUNGS: on inspection no signs of respiratory distress, breathing rate appears normal, no obvious gross SOB, gasping or wheezing.  Patient was asked to demonstrate a cough, she has a moist congested cough.  She was not coughing spontaneously throughout the interaction.  CV: no obvious cyanosis  MS: moves all visible extremities without noticeable abnormality  PSYCH/NEURO: pleasant and cooperative, no obvious depression or anxiety, speech and thought processing grossly intact  ASSESSMENT AND PLAN:  Discussed the following assessment and plan:  COVID-19 virus infection - Plan: DG Chest 2 View, CBC with Differential/Platelet, Comprehensive metabolic panel, CANCELED: CBC with Differential/Platelet, CANCELED: Comprehensive metabolic panel  Cough - Plan: DG Chest 2 View  Type 2 diabetes mellitus with other specified complication, with long-term current use of insulin (HCC)  No  problem-specific Assessment & Plan notes found for this encounter. Please go to the medical mall for chest x-ray  and laboratory studies.  Further recommendations pending these results.  In the meantime, continue with the Mucinex.  Try to borrow a pulse oximeter from your family member to check spot oxygen saturations as you are walking around the house.  With your degree of weakness, fatigue, and inability to perform simple tasks in the house, I do not think you will be ready for work next week.  I would give yourself 2 weeks before returning.  Continue to wear your mask when you are out of the house.   I discussed the assessment and treatment plan with the patient. The patient was provided an opportunity to ask questions and all were answered. The patient agreed with the plan and demonstrated an understanding of the instructions.   The patient was advised to call back or seek an in-person evaluation if the symptoms worsen or if the condition fails to improve as anticipated.  I provided 25 minutes of non-face-to-face time during this encounter.  Denice Paradise, NP Adult Nurse Practitioner [place] [phone]

## 2020-09-12 NOTE — Patient Instructions (Signed)
Please go to the medical mall for chest x-ray and laboratory studies.  Further recommendations pending these results.  In the meantime, continue with the Mucinex.  Try to borrow a pulse oximeter from your family member to check spot oxygen saturations as you are walking around the house.  With your degree of weakness, fatigue, and inability to perform simple tasks in the house, I do not not think you will be ready for work next week.  I would give yourself 2 weeks before returning.  Continue to wear your mask when you are out of the house.

## 2020-09-13 ENCOUNTER — Other Ambulatory Visit: Payer: Self-pay | Admitting: Family

## 2020-09-13 ENCOUNTER — Encounter: Payer: Self-pay | Admitting: Nurse Practitioner

## 2020-09-13 DIAGNOSIS — B379 Candidiasis, unspecified: Secondary | ICD-10-CM

## 2020-09-13 MED ORDER — FLUCONAZOLE 150 MG PO TABS: 150.0000 mg | ORAL_TABLET | Freq: Once | ORAL | 1 refills | Status: DC

## 2020-09-13 NOTE — Progress Notes (Signed)
Chief Complaint:   OBESITY Vanessa Higgins Higgins is here to discuss her progress with her obesity treatment plan along with follow-up of her obesity related diagnoses. Vanessa Higgins is on the Category 2 Plan and states she is following her eating plan approximately 75% of the time. Vanessa Higgins states she is exercising for 0 minutes 0 times per week.  Today's visit was #: 8 Starting weight: 205 lbs Starting date: 05/10/2020 Today's weight: 181 lbs Today's date: 09/11/2020 Total lbs lost to date: 24 lbs Total lbs lost since last in-office visit: 5 lbs  Interim History: Vanessa Higgins is down an additional 5 pounds since her last visit.  She has been sick (COVID) and has now recovered.  Subjective:   1. Hyperlipidemia associated with type 2 diabetes mellitus ([place]) Vanessa Higgins Higgins has hyperlipidemia and has been trying to improve her cholesterol levels with intensive lifestyle modification including a low saturated fat diet, exercise and weight loss. She denies any chest pain, claudication or myalgias.  She is taking Lipitor.  Lab Results  Component Value Date   ALT 21 09/12/2020   AST 26 09/12/2020   ALKPHOS 38 09/12/2020   BILITOT 1.0 09/12/2020   Lab Results  Component Value Date   CHOL 105 07/12/2020   HDL 41 07/12/2020   LDLCALC 49 07/12/2020   TRIG 70 07/12/2020   CHOLHDL 5 03/22/2020   2. Type 2 diabetes mellitus with other specified complication, with long-term current use of insulin (HCC) Medications reviewed. Diabetic ROS: no polyuria or polydipsia, no chest pain, dyspnea or TIA's, no numbness, tingling or pain in extremities.  She is taking metformin and Victoza.  Lab Results  Component Value Date   HGBA1C 6.0 (H) 07/12/2020   HGBA1C 6.9 (H) 03/22/2020   HGBA1C 7.1 (H) 10/12/2019   Lab Results  Component Value Date   MICROALBUR 1.6 03/22/2020   LDLCALC 49 07/12/2020   CREATININE 0.53 09/12/2020   3. Vitamin D deficiency Vanessa Higgins's Vitamin D level was 36.9 on 05/10/2020. She is currently taking  prescription vitamin D 50,000 IU each week. She denies nausea, vomiting or muscle weakness.    4. Low ferritin Vanessa Higgins is not a vegetarian.  She does not have a history of weight loss surgery.  She was started on an iron supplement approximatly 6 weeks ago after having a colonoscopy/endoscopy.    CBC Latest Ref Rng & Units 09/12/2020 07/12/2020 05/01/2020  WBC 4.0 - 10.5 K/uL 8.1 6.1 6.9  Hemoglobin 12.0 - 15.0 g/dL 13.0 11.3 9.6(L)  Hematocrit 36 - 46 % 43.1 36.8 33.7(L)  Platelets 150 - 400 K/uL 452(H) 422 250   Lab Results  Component Value Date   IRON 26 (L) 07/12/2020   TIBC 401 07/12/2020   FERRITIN 8 (L) 07/12/2020   Lab Results  Component Value Date   VITAMINB12 >2000 (H) 07/12/2020   5. At risk for osteoporosis Vanessa Higgins is at higher risk of osteopenia and osteoporosis due to Vitamin D deficiency and obesity.   Assessment/Plan:   1. Hyperlipidemia associated with type 2 diabetes mellitus ([place]) Cardiovascular risk and specific lipid/LDL goals reviewed.  We discussed several lifestyle modifications today and Vanessa Higgins Higgins will continue to work on diet, exercise and weight loss efforts. Orders and follow up as documented in patient record.  Continue medication.  Counseling Intensive lifestyle modifications are the first line treatment for this issue. . Dietary changes: Increase soluble fiber. Decrease simple carbohydrates. . Exercise changes: Moderate to vigorous-intensity aerobic activity 150 minutes per week if tolerated. . Lipid-lowering medications: see  documented in medical record.  2. Type 2 diabetes mellitus with other specified complication, with long-term current use of insulin (HCC) Good blood sugar control is important to decrease the likelihood of diabetic complications such as nephropathy, neuropathy, limb loss, blindness, coronary artery disease, and death. Intensive lifestyle modification including diet, exercise and weight loss are the first line of treatment for diabetes.   Continue medications.  Keep refined carbohydrate level low.  3. Vitamin D deficiency Low Vitamin D level contributes to fatigue and are associated with obesity, breast, and colon cancer. She agrees to continue to take prescription Vitamin D @50 ,000 IU every week and will follow-up for routine testing of Vitamin D, at least 2-3 times per year to avoid over-replacement.  -Refill Vitamin D, Ergocalciferol, (DRISDOL) 1.25 MG (50000 UNIT) CAPS capsule; TAKE 1 CAPSULE BY MOUTH EVERY 7 DAYS.  Dispense: 4 capsule; Refill: 0  4. Low ferritin Will check anemia panel today.  She will see her PCP tomorrow and discuss.  Orders and follow up as documented in patient record.  Counseling . Iron is essential for our bodies to make red blood cells.  Reasons that someone may be deficient include: an iron-deficient diet (more likely in those following vegan or vegetarian diets), women with heavy menses, patients with GI disorders or poor absorption, patients that have had bariatric surgery, frequent blood donors, patients with cancer, and patients with heart disease.   Vanessa Higgins Higgins foods include dark leafy greens, red and white meats, eggs, seafood, and beans.   . Certain foods and drinks prevent your body from absorbing iron properly. Avoid eating these foods in the same meal as iron-rich foods or with iron supplements. These foods include: coffee, black tea, and red wine; milk, dairy products, and foods that are high in calcium; beans and soybeans; whole grains.  . Constipation can be a side effect of iron supplementation. Increased water and fiber intake are helpful. Water goal: > 2 liters/day. Fiber goal: > 25 grams/day.  - Vitamin B12 - Folate - Iron and TIBC - Ferritin - Anemia panel  5. At risk for osteoporosis Vanessa Higgins was given approximately 15 minutes of osteoporosis prevention counseling today. Vanessa Higgins is at risk for osteopenia and osteoporosis due to her Vitamin D deficiency. She was encouraged to take her  Vitamin D and follow her higher calcium diet and increase strengthening exercise to help strengthen her bones and decrease her risk of osteopenia and osteoporosis.  Repetitive spaced learning was employed today to elicit superior memory formation and behavioral change.  6. Class 1 obesity with serious comorbidity and body mass index (BMI) of 32.0 to 32.9 in adult, unspecified obesity type Vanessa Higgins is currently in the action stage of change. As such, her goal is to continue with weight loss efforts. She has agreed to the Category 2 Plan.   Vanessa Higgins will work on meal planning and will continue to adhere closely to the plan.  Exercise goals: Older adults should follow the adult guidelines. When older adults cannot meet the adult guidelines, they should be as physically active as their abilities and conditions will allow.  Older adults should do exercises that maintain or improve balance if they are at risk of falling.   Behavioral modification strategies: increasing lean protein intake, decreasing simple carbohydrates, increasing vegetables, increasing water intake, decreasing eating out, no skipping meals, meal planning and cooking strategies, keeping healthy foods in the home and planning for success.  Vanessa Higgins has agreed to follow-up with our clinic in 2 weeks. She was  informed of the importance of frequent follow-up visits to maximize her success with intensive lifestyle modifications for her multiple health conditions.   Vanessa Higgins was informed we would discuss her lab results at her next visit unless there is a critical issue that needs to be addressed sooner. Vanessa Higgins agreed to keep her next visit at the agreed upon time to discuss these results.  Objective:   Blood pressure 139/75, pulse 86, temperature 98.5 F (36.9 C), height 5\' 3"  (1.6 m), weight 181 lb (82.1 kg), SpO2 93 %. Body mass index is 32.06 kg/m.  General: Cooperative, alert, well developed, in no acute distress. HEENT: Conjunctivae and lids  unremarkable. Cardiovascular: Regular rhythm.  Lungs: Normal work of breathing. Neurologic: No focal deficits.   Lab Results  Component Value Date   CREATININE 0.53 09/12/2020   BUN 12 09/12/2020   NA 138 09/12/2020   K 3.7 09/12/2020   CL 99 09/12/2020   CO2 26 09/12/2020   Lab Results  Component Value Date   ALT 21 09/12/2020   AST 26 09/12/2020   ALKPHOS 38 09/12/2020   BILITOT 1.0 09/12/2020   Lab Results  Component Value Date   HGBA1C 6.0 (H) 07/12/2020   HGBA1C 6.9 (H) 03/22/2020   HGBA1C 7.1 (H) 10/12/2019   HGBA1C 7.3 (H) 06/10/2019   HGBA1C 6.8 (H) 02/18/2019   Lab Results  Component Value Date   TSH 1.290 05/10/2020   Lab Results  Component Value Date   CHOL 105 07/12/2020   HDL 41 07/12/2020   LDLCALC 49 07/12/2020   TRIG 70 07/12/2020   CHOLHDL 5 03/22/2020   Lab Results  Component Value Date   WBC 8.1 09/12/2020   HGB 13.0 09/12/2020   HCT 43.1 09/12/2020   MCV 82.7 09/12/2020   PLT 452 (H) 09/12/2020   Lab Results  Component Value Date   IRON 26 (L) 07/12/2020   TIBC 401 07/12/2020   FERRITIN 8 (L) 07/12/2020   Attestation Statements:   Reviewed by clinician on day of visit: allergies, medications, problem list, medical history, surgical history, family history, social history, and previous encounter notes.  I, Water quality scientist, CMA, am acting as Location manager for [employer], DO  I have reviewed the above documentation for accuracy and completeness, and I agree with the above. Jearld Lesch, DO

## 2020-09-14 ENCOUNTER — Encounter (INDEPENDENT_AMBULATORY_CARE_PROVIDER_SITE_OTHER): Payer: Self-pay | Admitting: Bariatrics

## 2020-09-18 ENCOUNTER — Other Ambulatory Visit: Payer: Self-pay

## 2020-09-18 ENCOUNTER — Encounter
Admission: RE | Admit: 2020-09-18 | Discharge: 2020-09-18 | Disposition: A | Discharge status: Home / Self Care | Payer: No Typology Code available for payment source | Source: Ambulatory Visit | Attending: General Surgery | Admitting: General Surgery

## 2020-09-18 DIAGNOSIS — Z01812 Encounter for preprocedural laboratory examination: Secondary | ICD-10-CM | POA: Insufficient documentation

## 2020-09-18 HISTORY — DX: Cardiac arrhythmia, unspecified: I49.9

## 2020-09-18 HISTORY — DX: Personal history of other diseases of the digestive system: Z87.19

## 2020-09-18 NOTE — Pre-Procedure Instructions (Signed)
Vanessa Bush, MD  Physician  Cardiology  Progress Notes    Signed  Encounter Date:  05/04/2020          Signed      Expand All Collapse All  Show:Clear all [x] Manual[x] Template[x] Copied  Added by: [x] End, Harrell Gave, MD  [] Hover for details   Follow-up Outpatient Visit Date: 05/04/2020  Primary Care Provider: Burnard Hawthorne, FNP [address] [place] [address]  Chief Complaint: Shortness of breath  HPI:  Vanessa Higgins is a 69 y.o. female with history of frequent PVCs, hypertension, hyperlipidemia, type 2 diabetes mellitus, asthma, obstructive sleep apnea, and GERD, who presents for follow-up of palpitations/PVCs.  We last spoke via virtual visit a year ago, at which time Vanessa Higgins was feeling well.  She noted only rare palpitations without associated symptoms.  We did not make any medication changes or pursue additional testing at that time.  Today, Vanessa Higgins notes that she has experienced increasing shortness of breath over the last few months.  This initially began with a cough and chest congestion around Christmas.  She then began to experience worsening exertional dyspnea over the last month.  She has not had any dyspnea at rest nor orthopnea.  She also denies chest pain and edema.  She has woken up occasionally at night feeling short of breath and with her heart racing despite using her CPAP.  She has not had any palpitations or lightheadedness during the day.  She notes that her blood pressure is typically less than 130/80.  Vanessa Higgins was recently seen by Dr. Patsey Berthold in the pulmonary clinic, with changes made to her inhalers (she has yet to begin the new medication).  She is also scheduled for PFTs.  --------------------------------------------------------------------------------------------------  Cardiovascular History & Procedures: Cardiovascular Problems:  Frequent PVCs  Shortness of breath  Risk Factors:  Hypertension,  hyperlipidemia, diabetes mellitus, obesity, and age greater than 34  Cath/PCI:  None  CV Surgery:  None  EP Procedures and Devices:  Holter monitor (09/19/16): Predominantly sinus rhythm with rare PACs and frequent PVCs (11% burden). Single 5 beat atrial run.  Non-Invasive Evaluation(s):  TTE (09/19/16): Normal LV size with an EF of 60-65%. Normal wall motion and diastolic function. Normal RV size and function. No significant valvular abnormalities.  Pharmacologic MPI (09/06/16): Low risk study without ischemia. LVEF 55%.  Recent CV Pertinent Labs: Labs (Brief)       Lab Results  Component Value Date   CHOL 180 03/22/2020   CHOL 102 06/28/2013   HDL 39.70 03/22/2020   HDL 44 06/28/2013   LDLCALC 119 (H) 03/22/2020   LDLCALC 42 06/28/2013   TRIG 109.0 03/22/2020   TRIG 81 06/28/2013   CHOLHDL 5 03/22/2020   INR 1.07 02/18/2019   K 4.1 03/22/2020   K 4.2 12/28/2012   BUN 13 03/22/2020   BUN 16 12/28/2012   CREATININE 0.52 03/22/2020   CREATININE 0.57 12/02/2014      Past medical and surgical history were reviewed and updated in EPIC.  Active Medications      Current Meds  Medication Sig  . albuterol (PROVENTIL HFA;VENTOLIN HFA) 108 (90 Base) MCG/ACT inhaler Inhale 2 puffs into the lungs every 6 (six) hours as needed for wheezing or shortness of breath.  Marland Kitchen aspirin 81 MG tablet Take 81 mg by mouth daily.  Marland Kitchen atorvastatin (LIPITOR) 20 MG tablet Take 1 tablet (20 mg total) by mouth daily.  Marland Kitchen b complex vitamins tablet Take 1 tablet by mouth daily.  Marland Kitchen  cholecalciferol (VITAMIN D3) 25 MCG (1000 UT) tablet Take 1,000 Units by mouth daily.  . Fluticasone-Umeclidin-Vilant (TRELEGY ELLIPTA) 200-62.5-25 MCG/INH AEPB Inhale 1 puff into the lungs daily.  Marland Kitchen glucose blood (FREESTYLE LITE) test strip Use as instructed to check sugar up to 3 times daily  . insulin degludec (TRESIBA FLEXTOUCH) 100 UNIT/ML FlexTouch Pen Inject 0.2 mLs (20 Units total) into the  skin at bedtime.  . Insulin Pen Needle (BD PEN NEEDLE NANO U/F) 32G X 4 MM MISC USE TWICE A DAY AS DIRECTED WITH DIABETES MEDICATION PENS  . JARDIANCE 25 MG TABS tablet TAKE 1 TABLET BY MOUTH ONCE DAILY  . loratadine (CLARITIN) 10 MG tablet Take 10 mg by mouth daily.  Marland Kitchen losartan (COZAAR) 25 MG tablet TAKE 1 TABLET BY MOUTH DAILY.  . meclizine (ANTIVERT) 25 MG tablet Take 1 tablet (25 mg total) by mouth 3 (three) times daily as needed for dizziness.  . metFORMIN (GLUCOPHAGE) 1000 MG tablet TAKE 1 TABLET BY MOUTH 2 TIMES DAILY WITH A MEAL.  . montelukast (SINGULAIR) 10 MG tablet TAKE 1 TABLET BY MOUTH AT BEDTIME  . Multiple Vitamins-Minerals (CENTRUM SILVER PO) Take 1 tablet by mouth daily.   Marland Kitchen omeprazole (PRILOSEC) 20 MG capsule TAKE 1 CAPSULE BY MOUTH DAILY.  Marland Kitchen venlafaxine XR (EFFEXOR-XR) 150 MG 24 hr capsule TAKE 1 CAPSULE BY MOUTH DAILY WITH BREAKFAST.  Marland Kitchen Verapamil HCl CR 300 MG CP24 TAKE 1 CAPSULE BY MOUTH DAILY  . VICTOZA 18 MG/3ML SOPN INJECT 1.8 MG UNDER THE SKIN AT BEDTIME      Allergies: Sulfa antibiotics and Ramipril  Social History       Tobacco Use  . Smoking status: Never Smoker  . Smokeless tobacco: Never Used  Substance Use Topics  . Alcohol use: No  . Drug use: No         Family History  Problem Relation Age of Onset  . Sudden death Mother   . Cancer Mother 47       pancreatic  . Heart disease Father 37  . Sudden death Father   . Cancer Brother        colon  . Colon cancer Brother 28  . Stroke Maternal Grandmother   . Heart disease Maternal Grandfather   . Cancer Sister        brain  . Breast cancer Neg Hx   . Colon polyps Neg Hx   . Esophageal cancer Neg Hx   . Rectal cancer Neg Hx   . Stomach cancer Neg Hx   . Thyroid cancer Neg Hx     Review of Systems: A 12-system review of systems was performed and was negative except as noted in the  HPI.  --------------------------------------------------------------------------------------------------  Physical Exam: BP 130/66 (BP Location: Left Arm, Patient Position: Sitting, Cuff Size: Large)   Pulse 85   Ht 5\' 4"  (1.626 m)   Wt 212 lb 4 oz (96.3 kg)   SpO2 92%   BMI 36.43 kg/m     General: NAD. Neck: No JVD or HJR, though evaluation is limited by body habitus. Lungs: Mildly diminished breath sounds throughout without wheezes or crackles. Heart: Distant heart sounds with regular rate and rhythm.  No murmurs, rubs, or gallops. Abdomen: Soft, nontender, nondistended. Extremities: No lower extremity edema.  EKG: Normal sinus rhythm with low voltage.  Otherwise, no significant abnormality or change from prior tracing on 02/18/2019.  Recent Labs       Lab Results  Component Value Date   WBC  6.9 05/01/2020   HGB 9.6 (L) 05/01/2020   HCT 33.7 (L) 05/01/2020   MCV 73.4 (L) 05/01/2020   PLT 250 05/01/2020      Recent Labs       Lab Results  Component Value Date   NA 138 03/22/2020   K 4.1 03/22/2020   CL 103 03/22/2020   CO2 28 03/22/2020   BUN 13 03/22/2020   CREATININE 0.52 03/22/2020   GLUCOSE 91 03/22/2020   ALT 31 03/22/2020      Recent Labs       Lab Results  Component Value Date   CHOL 180 03/22/2020   HDL 39.70 03/22/2020   LDLCALC 119 (H) 03/22/2020   TRIG 109.0 03/22/2020   CHOLHDL 5 03/22/2020      --------------------------------------------------------------------------------------------------  ASSESSMENT AND PLAN: Dyspnea on exertion: Given that symptoms began with cough and chest congestion and or not associated with edema orthopnea, I suspect that underlying pulmonary process is driving dyspnea.  However, given history of frequent PVCs, it is conceivable that Vanessa Higgins could have developed cardiomyopathy leading to her symptoms.  I have recommended that we obtain a transthoracic echocardiogram for further  assessment.  I will defer cardiac medication changes today.  I encouraged Vanessa Higgins to proceed with adjustments to her inhalers as well as obtaining PFTs, as recommended by Dr. Patsey Berthold.  Frequent PVCs: Other than brief racing of her heart when she awakens abruptly at night, Vanessa Higgins denies palpitations.  Her EKG today shows sinus rhythm without ectopy.  We will plan to continue verapamil, though if cardiomyopathy is identified, we may need to consider transitioning to a beta-blocker.  Hypertension: Blood pressure borderline today but typically better at home.  We will defer medication changes at this time.  Hyperlipidemia: LDL mildly elevated at 109 on most recent labs (goal less than 100, ideally below 70, given history of diabetes mellitus).  Escalation of atorvastatin should be considered; I will defer this to Vanessa Higgins and readdress at our follow-up visit.  Follow-up: Return to clinic in 3 months.  Vanessa Bush, MD 05/04/2020 3:55 PM         Electronically signed by Vanessa Bush, MD at 05/05/2020 7:19 AM  Office Visit on 05/04/2020   Office Visit on 05/04/2020     Detailed Report    Note shared with patient

## 2020-09-18 NOTE — Patient Instructions (Addendum)
Your procedure is scheduled on: 10-04-20 [facility] Report to Same Day Surgery 2nd floor medical mall [facility] Entrance-take elevator on left to 2nd floor.  Check in with surgery information desk.) To find out your arrival time please call [phone] between 1PM - 3PM on 10-03-20 TUESDAY  Remember: Instructions that are not followed completely may result in serious medical risk, up to and including death, or upon the discretion of your surgeon and anesthesiologist your surgery may need to be rescheduled.    _x___ 1. Do not eat food after midnight the night before your procedure. NO GUM OR CANDY AFTER MIDNIGHT. You may drink WATER up to 2 hours before you are scheduled to arrive at the hospital for your procedure.  Do not drink WATER within 2 hours of your scheduled arrival to the hospital.  Type 1 and type 2 diabetics should only drink water-YOU MAY DRINK APPLE JUICE IF YOUR BLOOD SUGAR DROPS     __x__ 2. No Alcohol for 24 hours before or after surgery.   __x__3. No Smoking or e-cigarettes for 24 prior to surgery.  Do not use any chewable tobacco products for at least 6 hour prior to surgery   ____  4. Bring all medications with you on the day of surgery if instructed.    __x__ 5. Notify your doctor if there is any change in your medical condition     (cold, fever, infections).    x___6. On the morning of surgery brush your teeth with toothpaste and water.  You may rinse your mouth with mouth wash if you wish.  Do not swallow any toothpaste or mouthwash.   Do not wear jewelry, make-up, hairpins, clips or nail polish.  Do not wear lotions, powders, or perfumes.  Do not shave 48 hours prior to surgery. Men may shave face and neck.  Do not bring valuables to the hospital.    [facility] is not responsible for any belongings or valuables.               Contacts, dentures or bridgework may not be worn into surgery.  Leave your suitcase in the car. After surgery it may be brought to  your room.  For patients admitted to the hospital, discharge time is determined by your treatment team.  _  Patients discharged the day of surgery will not be allowed to drive home.  You will need someone to drive you home and stay with you the night of your procedure.    Please read over the following fact sheets that you were given:   [facility] Preparing for Surgery   _x___ TAKE THE FOLLOWING MEDICATION THE MORNING OF SURGERY WITH A SMALL SIP OF WATER. These include:  1. EFFEXOR (VENLAFAXINE)  2. PRILOSEC (OMEPRAZOLE)  3. TAKE AN EXTRA PRILOSEC THE NIGHT BEFORE YOUR SURGERY  4.  5.  6.  ____Fleets enema or Magnesium Citrate as directed.   _x___ Use CHG Soap as directed on instruction sheet   _X___ Use inhalers on the day of surgery and bring to hospital day of surgery-USE YOUR TRELEGY ELLIPTA AND ALBUTEROL INHALER AND BRING YOUR ALBUTEROL [place]  _X___ Stop Metformin 2 days prior to surgery-LAST DOSE ON 10-01-20 SUNDAY    _X___ Take 1/2 of usual insulin dose the night before surgery and none on the morning surgery-TAKE HALF OF YOUR TRESIBA (3 UNITS) THE NIGHT BEFORE YOUR SURGERY  _x___ Follow recommendations from Cardiologist, Pulmonologist or PCP regarding stopping Aspirin, Coumadin, Plavix ,Eliquis,  Effient, or Pradaxa, and Pletal-ASPIRIN WAS STOPPED ON 08-30-20  X____Stop Anti-inflammatories such as Advil, Aleve, Ibuprofen, Motrin, Naproxen, Naprosyn, Goodies powders or aspirin products 7 DAYS PRIOR TO SURGERY-OK to take Tylenol   _x___ Stop supplements until after surgery.    _X___ Bring C-Pap to the hospital.

## 2020-09-21 ENCOUNTER — Other Ambulatory Visit: Payer: Self-pay

## 2020-09-21 ENCOUNTER — Telehealth (INDEPENDENT_AMBULATORY_CARE_PROVIDER_SITE_OTHER): Payer: No Typology Code available for payment source | Admitting: Nurse Practitioner

## 2020-09-21 ENCOUNTER — Telehealth: Payer: Self-pay | Admitting: Family

## 2020-09-21 ENCOUNTER — Telehealth: Payer: Self-pay | Admitting: Nurse Practitioner

## 2020-09-21 ENCOUNTER — Encounter: Payer: Self-pay | Admitting: Nurse Practitioner

## 2020-09-21 VITALS — BP 131/93 | HR 91 | Temp 100.0°F | Ht 63.0 in | Wt 181.0 lb

## 2020-09-21 DIAGNOSIS — U071 COVID-19: Secondary | ICD-10-CM

## 2020-09-21 DIAGNOSIS — J22 Unspecified acute lower respiratory infection: Secondary | ICD-10-CM | POA: Diagnosis not present

## 2020-09-21 NOTE — Patient Instructions (Addendum)
Please seek in person care, this morning at  [facility] walk-in clinic.  You have worsening symptoms with cough, chest tightness,  sore throat, ear hurts, fever, and we need to rule out pneumonia, sinusitis, and other Covid infection complications.    You have transportation and a driver to take you there immediately after this telephone visit.

## 2020-09-21 NOTE — Telephone Encounter (Signed)
I called her to see if she is en route to Acute Care or ED for in-person evaluation of worsening Covid symptoms as recommend during our video visit today. I left a HIPAA compliant  message to call the office.

## 2020-09-21 NOTE — Progress Notes (Signed)
Virtual Visit via telephone Note  This visit type was conducted due to national recommendations for restrictions regarding the COVID-19 pandemic (e.g. social distancing).  This format is felt to be most appropriate for this patient at this time.  All issues noted in this document were discussed and addressed.  No physical exam was performed (except for noted visual exam findings with Video Visits).   I connected with@ on 09/21/20 at 10:30 AM EDT by a video enabled telemedicine application or telephone and verified that I am speaking with the correct person using two identifiers. Location patient: home Location provider: work or home office Persons participating in the virtual visit: patient, provider  I discussed the limitations, risks, security and privacy concerns of performing an evaluation and management service by telephone and the availability of in person appointments. I also discussed with the patient that there may be a patient responsible charge related to this service. The patient expressed understanding and agreed to proceed.  Interactive audio and video telecommunications were attempted between this provider and patient, however failed, due to patient did not have access to video capability.  We continued and completed visit with audio only.   Reason for visit: Follow up Covid- scheduled appt  HPI: She was diagnosed with positive Covid on September 2 and received monoclonal antibody infusion on September 6.  She has been using Mucinex 400 mg 2 tablets every 6 hours to help bring up phlegm.  She was feeling overall improved on 09/12/2020 video visit and still has weakness, decreased energy, and has been able to do small chores around the house.  Her coughing was improved and she had a  CXR on 09/12/2020 that was normal.  She has been taking Mucinex, and reports that she has steadily improved until yesterday.    Patient reports she was feeling really good until she got up yesterday morning  and did not feel well.  She had hoarseness, and had to use her inhaler twice last night due to chest tightness and cough.  Pulse oximetry 90 to 94%.  She has a sore throat, eyes hurt, left ear hurts, and she has a congested cough.  Pulse ox currently is 91%, blood pressure 131/93, HR 91,  temp of 100.0. She has fatigue and feels achy.   ROS: See pertinent positives and negatives per HPI.  Past Medical History:  Diagnosis Date  . Allergy    hay fever  . Anemia   . Ankle fracture, left 04/08/2014  . Anxiety and depression   . Arthritis   . Asthma    well controlled   . Blood transfusion abn reaction or complication, no procedure mishap   . Blood transfusion without reported diagnosis   . COVID-19 virus infection 08/2020  . Depression   . Diabetes mellitus   . Disc displacement, lumbar   . Dysrhythmia    pvc's  . Fatty liver   . GERD (gastroesophageal reflux disease)   . Headache(784.0)   . History of hiatal hernia    small  . Hyperlipidemia   . Hypertension   . Joint pain   . Low vitamin B12 level   . Low vitamin D level   . Migraine   . Mitral valve prolapse   . Obstructive sleep apnea    CPAP  . Osteoarthritis   . Palpitations   . PONV (postoperative nausea and vomiting)    h/o in the past  . Sleep apnea 04/06/2017   uses CPAP  . SOB (shortness of  breath)   . UTI (lower urinary tract infection)   . Vitamin B 12 deficiency   . Vitamin D deficiency   . Wears glasses     Past Surgical History:  Procedure Laterality Date  . APPENDECTOMY  1977  . COLONOSCOPY  last 11/17/2014  . JOINT REPLACEMENT    . KNEE ARTHROPLASTY Right 03/03/2019   Procedure: COMPUTER ASSISTED TOTAL KNEE ARTHROPLASTY-RIGHT;  Surgeon: Dereck Leep, MD;  Location: [facility];  Service: Orthopedics;  Laterality: Right;  . LUMBAR LAMINECTOMY/DECOMPRESSION MICRODISCECTOMY Right 02/06/2018   Procedure: Lumbar two Hemilaminectomy for Discectomy;  Surgeon: Ashok Pall, MD;  Location: [place];  Service:  Neurosurgery;  Laterality: Right;  . ORIF ANKLE FRACTURE Left 04/08/2014   Procedure: OPEN REDUCTION INTERNAL FIXATION (ORIF) ANKLE FRACTURE LEFT ANKLE FRACTURE OPEN TREATMENT BIMALLEOLAR ANKLE INCLUDES INTERNAL FIXATION;  Surgeon: Johnny Bridge, MD;  Location: [place];  Service: Orthopedics;  Laterality: Left;  . PAROTIDECTOMY  1980   left  . POLYPECTOMY    . TONSILLECTOMY AND ADENOIDECTOMY     as a child  . TOTAL KNEE ARTHROPLASTY Bilateral    left  . TUBAL LIGATION  05/1976    Family History  Problem Relation Age of Onset  . Sudden death Mother   . Cancer Mother 9       pancreatic  . Heart disease Father 10  . Sudden death Father   . Cancer Brother        colon  . Colon cancer Brother 25  . Stroke Maternal Grandmother   . Heart disease Maternal Grandfather   . Cancer Sister        brain  . Breast cancer Neg Hx   . Colon polyps Neg Hx   . Esophageal cancer Neg Hx   . Rectal cancer Neg Hx   . Stomach cancer Neg Hx   . Thyroid cancer Neg Hx     SOCIAL HX: never smoked   Current Outpatient Medications:  .  albuterol (PROVENTIL HFA;VENTOLIN HFA) 108 (90 Base) MCG/ACT inhaler, Inhale 2 puffs into the lungs every 6 (six) hours as needed for wheezing or shortness of breath., Disp: , Rfl:  .  aspirin 81 MG tablet, Take 81 mg by mouth daily. , Disp: , Rfl:  .  atorvastatin (LIPITOR) 20 MG tablet, Take 1 tablet (20 mg total) by mouth daily. (Patient taking differently: Take 20 mg by mouth at bedtime. ), Disp: 90 tablet, Rfl: 1 .  b complex vitamins tablet, Take 1 tablet by mouth daily., Disp: , Rfl:  .  cholecalciferol (VITAMIN D3) 25 MCG (1000 UT) tablet, Take 1,000 Units by mouth daily., Disp: , Rfl:  .  Dextromethorphan-guaiFENesin 5-100 MG/5ML LIQD, Take 10 mLs by mouth at bedtime as needed (for cough)., Disp: 118 mL, Rfl: 0 .  docusate sodium (COLACE) 100 MG capsule, Take 100 mg by mouth daily. , Disp: , Rfl:  .  ferrous sulfate 325 (65 FE) MG EC tablet,  Take 1 tablet (325 mg total) by mouth 2 (two) times daily with a meal., Disp: 60 tablet, Rfl: 2 .  Fluticasone-Umeclidin-Vilant (TRELEGY ELLIPTA) 200-62.5-25 MCG/INH AEPB, Inhale 1 puff into the lungs daily. (Patient taking differently: Inhale 1 puff into the lungs every morning. ), Disp: 60 each, Rfl: 6 .  glucose blood (FREESTYLE LITE) test strip, USE AS INSTRUCTED TO CHECK SUGAR UP TO 3 TIMES DAILY, Disp: 100 strip, Rfl: 12 .  insulin degludec (TRESIBA FLEXTOUCH) 100 UNIT/ML FlexTouch Pen, Inject 0.2 mLs (  20 Units total) into the skin at bedtime. (Patient taking differently: Inject 6 Units into the skin at bedtime. ), Disp: 15 mL, Rfl: 2 .  Insulin Pen Needle (BD PEN NEEDLE NANO U/F) 32G X 4 MM MISC, USE TWICE A DAY AS DIRECTED WITH DIABETES MEDICATION PENS, Disp: 200 each, Rfl: 5 .  JARDIANCE 25 MG TABS tablet, TAKE 1 TABLET BY MOUTH ONCE DAILY (Patient taking differently: Take 25 mg by mouth daily. ), Disp: 90 tablet, Rfl: 1 .  loratadine (CLARITIN) 10 MG tablet, Take 10 mg by mouth at bedtime. , Disp: , Rfl:  .  losartan (COZAAR) 25 MG tablet, TAKE 1 TABLET BY MOUTH DAILY. (Patient taking differently: Take 25 mg by mouth every morning. ), Disp: 90 tablet, Rfl: 1 .  meclizine (ANTIVERT) 25 MG tablet, Take 1 tablet (25 mg total) by mouth 3 (three) times daily as needed for dizziness., Disp: 30 tablet, Rfl: 0 .  metFORMIN (GLUCOPHAGE) 1000 MG tablet, TAKE 1 TABLET BY MOUTH 2 TIMES DAILY WITH A MEAL. (Patient taking differently: Take 1,000 mg by mouth 2 (two) times daily with a meal. ), Disp: 180 tablet, Rfl: 3 .  montelukast (SINGULAIR) 10 MG tablet, TAKE 1 TABLET BY MOUTH AT BEDTIME (Patient taking differently: Take 10 mg by mouth at bedtime. ), Disp: 90 tablet, Rfl: 1 .  Multiple Vitamins-Minerals (CENTRUM SILVER PO), Take 1 tablet by mouth daily. , Disp: , Rfl:  .  omeprazole (PRILOSEC) 20 MG capsule, TAKE 1 CAPSULE BY MOUTH DAILY. (Patient taking differently: Take 20 mg by mouth every morning. ),  Disp: 90 capsule, Rfl: 0 .  venlafaxine XR (EFFEXOR-XR) 150 MG 24 hr capsule, TAKE 1 CAPSULE BY MOUTH DAILY WITH BREAKFAST. (Patient taking differently: Take 150 mg by mouth daily with breakfast. ), Disp: 90 capsule, Rfl: 1 .  Verapamil HCl CR 300 MG CP24, TAKE 1 CAPSULE BY MOUTH DAILY (Patient taking differently: Take 300 mg by mouth at bedtime. ), Disp: 90 capsule, Rfl: 1 .  VICTOZA 18 MG/3ML SOPN, INJECT 1.8 MG UNDER THE SKIN AT BEDTIME (Patient taking differently: Inject 1.8 mg into the skin at bedtime. ), Disp: 9 mL, Rfl: 3 .  Vitamin D, Ergocalciferol, (DRISDOL) 1.25 MG (50000 UNIT) CAPS capsule, TAKE 1 CAPSULE BY MOUTH EVERY 7 DAYS., Disp: 4 capsule, Rfl: 0  EXAM:  VITALS per patient if applicable: 440, pulse 91, blood pressure 131/93, oxygen saturation 91% by pulse oximeter.  GENERAL: Telephone call today as patient does not have video capabilities.  She is alert, oriented, good historian.    HEENT: Nasal congested tone, hoarseness, coughing during conversation.  LUNGS: Respirations sound non labored, she is completing full sentences without breathiness, but she is coughing intermittently throughout the conversation.  Congested cough,  no audible wheezing or signs of respiratory distress.  PSYCH/NEURO: pleasant and cooperative, no obvious depression or anxiety, speech and thought processing grossly intact  ASSESSMENT AND PLAN:  Discussed the following assessment and plan:  COVID-19 virus infection  Lower respiratory tract infection  No problem-specific Assessment & Plan notes found for this encounter.  Patient advised: Please seek in person care, this morning at  [facility] clinic.  You have worsening symptoms with cough, chest tightness,  sore throat, ear hurts, fever, and we need to rule out pneumonia, sinusitis, and other Covid infection complications.   You have transportation and a driver to take you there immediately after this telephone visit.  I discussed  the assessment and treatment plan with the  patient. The patient was provided an opportunity to ask questions and all were answered. The patient agreed with the plan and demonstrated an understanding of the instructions.   The patient was advised to call back or seek an in-person evaluation if the symptoms worsen or if the condition fails to improve as anticipated.  I provided 12 minutes of non-face-to-face time during this encounter.  Denice Paradise, NP Adult Nurse Practitioner [place] [phone]

## 2020-09-21 NOTE — Telephone Encounter (Signed)
Patient called in stated that she went to urgent care this morning and they gave her some antibiotics and prednisone and wanted Joelene Millin to go ahead a file her short term disability.

## 2020-09-22 NOTE — Telephone Encounter (Signed)
Pt would like a call back from the nurse.  

## 2020-09-22 NOTE — Telephone Encounter (Signed)
Patient wanted to let Maudie Mercury know she is doing better today; o2 is up to 94%. States she will most likely be out of work another week for you to fill out her FMLA paperwork

## 2020-09-22 NOTE — Telephone Encounter (Signed)
Ok. Let her know she can pick it up on Mon.

## 2020-09-22 NOTE — Telephone Encounter (Signed)
Patient did go somewhere to be seen; see other TE

## 2020-09-25 ENCOUNTER — Encounter (INDEPENDENT_AMBULATORY_CARE_PROVIDER_SITE_OTHER): Payer: Self-pay | Admitting: Bariatrics

## 2020-09-25 ENCOUNTER — Telehealth: Payer: Self-pay | Admitting: Nurse Practitioner

## 2020-09-25 ENCOUNTER — Ambulatory Visit (INDEPENDENT_AMBULATORY_CARE_PROVIDER_SITE_OTHER): Payer: No Typology Code available for payment source | Admitting: Bariatrics

## 2020-09-25 ENCOUNTER — Other Ambulatory Visit: Payer: Self-pay

## 2020-09-25 ENCOUNTER — Other Ambulatory Visit: Payer: No Typology Code available for payment source

## 2020-09-25 VITALS — BP 147/76 | HR 85 | Temp 97.7°F | Ht 63.0 in | Wt 178.0 lb

## 2020-09-25 DIAGNOSIS — Z6831 Body mass index (BMI) 31.0-31.9, adult: Secondary | ICD-10-CM | POA: Diagnosis not present

## 2020-09-25 DIAGNOSIS — E1169 Type 2 diabetes mellitus with other specified complication: Secondary | ICD-10-CM

## 2020-09-25 DIAGNOSIS — E7849 Other hyperlipidemia: Secondary | ICD-10-CM

## 2020-09-25 DIAGNOSIS — E669 Obesity, unspecified: Secondary | ICD-10-CM

## 2020-09-25 NOTE — Telephone Encounter (Signed)
I called to speak with Vanessa Higgins abut her FMLA form. I had to [facility] and will try to connect with her again from 12:30-1:00 today if she will be available then.

## 2020-09-26 DIAGNOSIS — Z0279 Encounter for issue of other medical certificate: Secondary | ICD-10-CM

## 2020-09-26 NOTE — Progress Notes (Signed)
Chief Complaint:   OBESITY Vanessa Higgins is here to discuss her progress with her obesity treatment plan along with follow-up of her obesity related diagnoses. Vanessa Higgins is on the Category 2 Plan and states she is following her eating plan approximately 50% of the time. Vanessa Higgins states she is exercising for 0 minutes 0 times per week.  Today's visit was #: 9 Starting weight: 205 lbs Starting date: 05/10/2020 Today's weight: 178 lbs Today's date: 09/25/2020 Total lbs lost to date: 27 lbs Total lbs lost since last in-office visit: 3 lbs  Interim History:  She is down 3 pounds since her last visit.  She had been on prednisone and still lost.  She is doing well with her water.  Subjective:   1. Diabetes mellitus type 2 in obese [facility]) Medications reviewed. Diabetic ROS: no polyuria or polydipsia, no chest pain, dyspnea or TIA's, no numbness, tingling or pain in extremities.  She is taking Jardiance, Victoza, and metformin.  FBS 110-120.  Two hour postprandial - highest - 100.  She denies lows.  Lab Results  Component Value Date   HGBA1C 6.0 (H) 07/12/2020   HGBA1C 6.9 (H) 03/22/2020   HGBA1C 7.1 (H) 10/12/2019   Lab Results  Component Value Date   MICROALBUR 1.6 03/22/2020   LDLCALC 49 07/12/2020   CREATININE 0.53 09/12/2020   2. Other hyperlipidemia Vanessa Higgins has hyperlipidemia and has been trying to improve her cholesterol levels with intensive lifestyle modification including a low saturated fat diet, exercise and weight loss. She denies any chest pain, claudication or myalgias.  She is taking Lipitor.  Lab Results  Component Value Date   ALT 21 09/12/2020   AST 26 09/12/2020   ALKPHOS 38 09/12/2020   BILITOT 1.0 09/12/2020   Lab Results  Component Value Date   CHOL 105 07/12/2020   HDL 41 07/12/2020   LDLCALC 49 07/12/2020   TRIG 70 07/12/2020   CHOLHDL 5 03/22/2020   Assessment/Plan:   1. Diabetes mellitus type 2 in obese (HCC) Good blood sugar control is important to decrease  the likelihood of diabetic complications such as nephropathy, neuropathy, limb loss, blindness, coronary artery disease, and death. Intensive lifestyle modification including diet, exercise and weight loss are the first line of treatment for diabetes.  Continue medications.  2. Other hyperlipidemia Cardiovascular risk and specific lipid/LDL goals reviewed.  We discussed several lifestyle modifications today and Vanessa Higgins will continue to work on diet, exercise and weight loss efforts. Orders and follow up as documented in patient record.  Continue Lipitor.   Counseling Intensive lifestyle modifications are the first line treatment for this issue. . Dietary changes: Increase soluble fiber. Decrease simple carbohydrates. . Exercise changes: Moderate to vigorous-intensity aerobic activity 150 minutes per week if tolerated. . Lipid-lowering medications: see documented in medical record.  3. Class 1 obesity with serious comorbidity and body mass index (BMI) of 31.0 to 31.9 in adult, unspecified obesity type  Vanessa Higgins is currently in the action stage of change. As such, her goal is to continue with weight loss efforts. She has agreed to the Category 2 Plan.   She will work on meal planning and intentional eating.  Labs were reviewed with the patient and independently including CMP, CBC, and UA.  Exercise goals: Older adults should follow the adult guidelines. When older adults cannot meet the adult guidelines, they should be as physically active as their abilities and conditions will allow.  Older adults should do exercises that maintain or improve balance if  they are at risk of falling.   Behavioral modification strategies: increasing lean protein intake, decreasing simple carbohydrates, increasing vegetables, increasing water intake, decreasing eating out, no skipping meals, meal planning and cooking strategies, keeping healthy foods in the home and planning for success.  Vanessa Higgins has agreed to follow-up with  our clinic in 3 weeks. She was informed of the importance of frequent follow-up visits to maximize her success with intensive lifestyle modifications for her multiple health conditions.   Objective:   Blood pressure (!) 147/76, pulse 85, temperature 97.7 F (36.5 C), height 5\' 3"  (1.6 m), weight 178 lb (80.7 kg), SpO2 93 %. Body mass index is 31.53 kg/m.  General: Cooperative, alert, well developed, in no acute distress. HEENT: Conjunctivae and lids unremarkable. Cardiovascular: Regular rhythm.  Lungs: Normal work of breathing. Neurologic: No focal deficits.   Lab Results  Component Value Date   CREATININE 0.53 09/12/2020   BUN 12 09/12/2020   NA 138 09/12/2020   K 3.7 09/12/2020   CL 99 09/12/2020   CO2 26 09/12/2020   Lab Results  Component Value Date   ALT 21 09/12/2020   AST 26 09/12/2020   ALKPHOS 38 09/12/2020   BILITOT 1.0 09/12/2020   Lab Results  Component Value Date   HGBA1C 6.0 (H) 07/12/2020   HGBA1C 6.9 (H) 03/22/2020   HGBA1C 7.1 (H) 10/12/2019   HGBA1C 7.3 (H) 06/10/2019   HGBA1C 6.8 (H) 02/18/2019   Lab Results  Component Value Date   TSH 1.290 05/10/2020   Lab Results  Component Value Date   CHOL 105 07/12/2020   HDL 41 07/12/2020   LDLCALC 49 07/12/2020   TRIG 70 07/12/2020   CHOLHDL 5 03/22/2020   Lab Results  Component Value Date   WBC 8.1 09/12/2020   HGB 13.0 09/12/2020   HCT 43.1 09/12/2020   MCV 82.7 09/12/2020   PLT 452 (H) 09/12/2020   Lab Results  Component Value Date   IRON 26 (L) 07/12/2020   TIBC 401 07/12/2020   FERRITIN 8 (L) 07/12/2020   Attestation Statements:   Reviewed by clinician on day of visit: allergies, medications, problem list, medical history, surgical history, family history, social history, and previous encounter notes.  I, Water quality scientist, CMA, am acting as Location manager for [employer], DO  I have reviewed the above documentation for accuracy and completeness, and I agree with the above. Jearld Lesch, DO

## 2020-09-26 NOTE — Telephone Encounter (Signed)
Faxed FMLA paperwork off

## 2020-09-26 NOTE — Telephone Encounter (Signed)
I spoke with her

## 2020-09-27 ENCOUNTER — Encounter (INDEPENDENT_AMBULATORY_CARE_PROVIDER_SITE_OTHER): Payer: Self-pay | Admitting: Bariatrics

## 2020-09-27 ENCOUNTER — Telehealth (INDEPENDENT_AMBULATORY_CARE_PROVIDER_SITE_OTHER): Payer: No Typology Code available for payment source | Admitting: Family

## 2020-09-27 ENCOUNTER — Other Ambulatory Visit: Payer: Self-pay

## 2020-09-27 ENCOUNTER — Other Ambulatory Visit: Payer: Self-pay | Admitting: Family

## 2020-09-27 ENCOUNTER — Encounter: Payer: Self-pay | Admitting: Family

## 2020-09-27 DIAGNOSIS — U071 COVID-19: Secondary | ICD-10-CM

## 2020-09-27 DIAGNOSIS — J22 Unspecified acute lower respiratory infection: Secondary | ICD-10-CM | POA: Diagnosis not present

## 2020-09-27 NOTE — Progress Notes (Signed)
Virtual Visit via Video Note  I connected with@  on 09/27/20 at  2:00 PM EDT by a video enabled telemedicine application and verified that I am speaking with the correct person using two identifiers.  Location patient: home Location provider:work  Persons participating in the virtual visit: patient, provider  I discussed the limitations of evaluation and management by telemedicine and the availability of in person appointments. The patient expressed understanding and agreed to proceed.   HPI: Follow up covid.  Feels well today  Would like to have paperwork done for STD and to return to work Cough, sob resolved. Afebrile.  No diarrhea. Energy improving.  Can feel bilateral enlarged 'lymph nodes' on sides of neck which she noticed past couple of days. They are not red or tender to touch. Not growing in size.  Currently on doxycycline, completed the prednisone 6 day taper  08/31/20 diagnosed positive covid, s/p monoclonal antibodies.  CXR 09/12/20 - no acute findings.   ROS: See pertinent positives and negatives per HPI.    EXAM:  VITALS per patient if applicable:  GENERAL: alert, oriented, appears well and in no acute distress  HEENT: atraumatic, conjunttiva clear, no obvious abnormalities on inspection of external nose and ears  NECK: normal movements of the head and neck  LUNGS: on inspection no signs of respiratory distress, breathing rate appears normal, no obvious gross SOB, gasping or wheezing  CV: no obvious cyanosis  MS: moves all visible extremities without noticeable abnormality  PSYCH/NEURO: pleasant and cooperative, no obvious depression or anxiety, speech and thought processing grossly intact  ASSESSMENT AND PLAN:  Discussed the following assessment and plan:  Problem List Items Addressed This Visit      Respiratory   Lower respiratory tract infection due to 2019 novel coronavirus    Pleased as she had greatly improved. Suspected viral lymphadenopathy  expected after viral illness. Advised to monitor and if gradually over the next couple of weeks doesn't resolve or becomes red, tender, to let me know. Forms completed to return to work 10/02/20         -we discussed possible serious and likely etiologies, options for evaluation and workup, limitations of telemedicine visit vs in person visit, treatment, treatment risks and precautions. Pt prefers to treat via telemedicine empirically rather then risking or undertaking an in person visit at this moment.  .   I discussed the assessment and treatment plan with the patient. The patient was provided an opportunity to ask questions and all were answered. The patient agreed with the plan and demonstrated an understanding of the instructions.   The patient was advised to call back or seek an in-person evaluation if the symptoms worsen or if the condition fails to improve as anticipated.   Mable Paris, FNP

## 2020-09-27 NOTE — Assessment & Plan Note (Signed)
Pleased as she had greatly improved. Suspected viral lymphadenopathy expected after viral illness. Advised to monitor and if gradually over the next couple of weeks doesn't resolve or becomes red, tender, to let me know. Forms completed to return to work 10/02/20

## 2020-09-29 NOTE — Telephone Encounter (Signed)
Labs done.  Vander Kueker,cma

## 2020-10-03 ENCOUNTER — Other Ambulatory Visit: Payer: Self-pay | Admitting: Family

## 2020-10-04 ENCOUNTER — Ambulatory Visit
Admission: RE | Admit: 2020-10-04 | Payer: No Typology Code available for payment source | Source: Home / Self Care | Admitting: General Surgery

## 2020-10-04 ENCOUNTER — Encounter: Admission: RE | Payer: Self-pay | Source: Home / Self Care

## 2020-10-04 SURGERY — REPAIR, HERNIA, INGUINAL, ADULT
Anesthesia: General | Laterality: Right

## 2020-10-05 ENCOUNTER — Other Ambulatory Visit: Payer: Self-pay | Admitting: General Surgery

## 2020-10-16 ENCOUNTER — Other Ambulatory Visit: Payer: Self-pay | Admitting: Family

## 2020-10-16 DIAGNOSIS — E119 Type 2 diabetes mellitus without complications: Secondary | ICD-10-CM

## 2020-10-20 ENCOUNTER — Ambulatory Visit: Payer: No Typology Code available for payment source | Admitting: Gastroenterology

## 2020-10-30 ENCOUNTER — Other Ambulatory Visit: Admission: RE | Admit: 2020-10-30 | Payer: No Typology Code available for payment source | Source: Ambulatory Visit

## 2020-11-01 ENCOUNTER — Ambulatory Visit
Admission: RE | Admit: 2020-11-01 | Discharge: 2020-11-01 | Disposition: A | Discharge status: Home / Self Care | Payer: No Typology Code available for payment source | Attending: General Surgery | Admitting: General Surgery

## 2020-11-01 ENCOUNTER — Encounter: Payer: Self-pay | Admitting: General Surgery

## 2020-11-01 ENCOUNTER — Other Ambulatory Visit: Payer: Self-pay

## 2020-11-01 ENCOUNTER — Encounter
Admission: RE | Disposition: A | Discharge status: Home / Self Care | Payer: Self-pay | Source: Home / Self Care | Attending: General Surgery

## 2020-11-01 ENCOUNTER — Ambulatory Visit: Payer: No Typology Code available for payment source | Admitting: Anesthesiology

## 2020-11-01 DIAGNOSIS — Z8616 Personal history of COVID-19: Secondary | ICD-10-CM | POA: Insufficient documentation

## 2020-11-01 DIAGNOSIS — K4090 Unilateral inguinal hernia, without obstruction or gangrene, not specified as recurrent: Secondary | ICD-10-CM | POA: Insufficient documentation

## 2020-11-01 HISTORY — PX: INGUINAL HERNIA REPAIR: SHX194

## 2020-11-01 LAB — GLUCOSE, CAPILLARY
Glucose-Capillary: 103 mg/dL — ABNORMAL HIGH (ref 70–99)
Glucose-Capillary: 121 mg/dL — ABNORMAL HIGH (ref 70–99)

## 2020-11-01 SURGERY — REPAIR, HERNIA, INGUINAL, ADULT
Anesthesia: General | Site: Abdomen | Laterality: Right

## 2020-11-01 MED ORDER — ACETAMINOPHEN 10 MG/ML IV SOLN
INTRAVENOUS | Status: DC | PRN
  Administered 2020-11-01: 1000 mg via INTRAVENOUS

## 2020-11-01 MED ORDER — EPINEPHRINE PF 1 MG/ML IJ SOLN
INTRAMUSCULAR | Status: AC
  Filled 2020-11-01: qty 1

## 2020-11-01 MED ORDER — KETOROLAC TROMETHAMINE 30 MG/ML IJ SOLN
INTRAMUSCULAR | Status: DC | PRN
  Administered 2020-11-01: 30 mg via INTRAVENOUS

## 2020-11-01 MED ORDER — BUPIVACAINE HCL (PF) 0.5 % IJ SOLN
INTRAMUSCULAR | Status: AC
  Filled 2020-11-01: qty 30

## 2020-11-01 MED ORDER — FENTANYL CITRATE (PF) 100 MCG/2ML IJ SOLN
INTRAMUSCULAR | Status: DC | PRN
  Administered 2020-11-01 (×2): 25 ug via INTRAVENOUS
  Administered 2020-11-01: 50 ug via INTRAVENOUS

## 2020-11-01 MED ORDER — CEFAZOLIN SODIUM-DEXTROSE 2-4 GM/100ML-% IV SOLN
INTRAVENOUS | Status: AC
  Filled 2020-11-01: qty 100

## 2020-11-01 MED ORDER — SODIUM CHLORIDE 0.9 % IV SOLN: INTRAVENOUS | Status: DC

## 2020-11-01 MED ORDER — GLYCOPYRROLATE 0.2 MG/ML IJ SOLN
INTRAMUSCULAR | Status: DC | PRN
  Administered 2020-11-01: .2 mg via INTRAVENOUS

## 2020-11-01 MED ORDER — MIDAZOLAM HCL 2 MG/2ML IJ SOLN
INTRAMUSCULAR | Status: AC
  Filled 2020-11-01: qty 2

## 2020-11-01 MED ORDER — OXYCODONE HCL 5 MG PO TABS: 5.0000 mg | ORAL_TABLET | Freq: Once | ORAL | Status: DC | PRN

## 2020-11-01 MED ORDER — OXYCODONE HCL 5 MG/5ML PO SOLN: 5.0000 mg | Freq: Once | ORAL | Status: DC | PRN

## 2020-11-01 MED ORDER — PROMETHAZINE HCL 25 MG/ML IJ SOLN: 6.2500 mg | INTRAMUSCULAR | Status: DC | PRN

## 2020-11-01 MED ORDER — DEXAMETHASONE SODIUM PHOSPHATE 10 MG/ML IJ SOLN
INTRAMUSCULAR | Status: DC | PRN
  Administered 2020-11-01: 10 mg via INTRAVENOUS

## 2020-11-01 MED ORDER — ONDANSETRON HCL 4 MG/2ML IJ SOLN
INTRAMUSCULAR | Status: DC | PRN
  Administered 2020-11-01: 4 mg via INTRAVENOUS

## 2020-11-01 MED ORDER — DEXTROSE 5 % IV SOLN
2000.0000 mg | Freq: Once | INTRAVENOUS | Status: DC
  Filled 2020-11-01: qty 20

## 2020-11-01 MED ORDER — PROPOFOL 10 MG/ML IV BOLUS
INTRAVENOUS | Status: DC | PRN
  Administered 2020-11-01: 150 mg via INTRAVENOUS

## 2020-11-01 MED ORDER — FENTANYL CITRATE (PF) 100 MCG/2ML IJ SOLN
INTRAMUSCULAR | Status: AC
  Filled 2020-11-01: qty 2

## 2020-11-01 MED ORDER — ACETAMINOPHEN 10 MG/ML IV SOLN
INTRAVENOUS | Status: AC
  Filled 2020-11-01: qty 100

## 2020-11-01 MED ORDER — CEFAZOLIN SODIUM-DEXTROSE 2-4 GM/100ML-% IV SOLN
2.0000 g | Freq: Once | INTRAVENOUS | Status: AC
  Administered 2020-11-01: 2 g via INTRAVENOUS

## 2020-11-01 MED ORDER — CHLORHEXIDINE GLUCONATE 0.12 % MT SOLN
15.0000 mL | Freq: Once | OROMUCOSAL | Status: AC
  Administered 2020-11-01: 15 mL via OROMUCOSAL

## 2020-11-01 MED ORDER — LIDOCAINE HCL (CARDIAC) PF 100 MG/5ML IV SOSY
PREFILLED_SYRINGE | INTRAVENOUS | Status: DC | PRN
  Administered 2020-11-01: 100 mg via INTRAVENOUS

## 2020-11-01 MED ORDER — PROPOFOL 10 MG/ML IV BOLUS
INTRAVENOUS | Status: AC
  Filled 2020-11-01: qty 20

## 2020-11-01 MED ORDER — FENTANYL CITRATE (PF) 100 MCG/2ML IJ SOLN: 25.0000 ug | INTRAMUSCULAR | Status: DC | PRN

## 2020-11-01 MED ORDER — ORAL CARE MOUTH RINSE: 15.0000 mL | Freq: Once | OROMUCOSAL | Status: AC

## 2020-11-01 MED ORDER — BUPIVACAINE-EPINEPHRINE (PF) 0.5% -1:200000 IJ SOLN
INTRAMUSCULAR | Status: DC | PRN
  Administered 2020-11-01: 20 mL via PERINEURAL
  Administered 2020-11-01: 10 mL via PERINEURAL

## 2020-11-01 SURGICAL SUPPLY — 38 items
APL PRP STRL LF DISP 70% ISPRP (MISCELLANEOUS) ×1
APL SKNCLS STERI-STRIP NONHPOA (GAUZE/BANDAGES/DRESSINGS) ×1
BENZOIN TINCTURE PRP APPL 2/3 (GAUZE/BANDAGES/DRESSINGS) ×1 IMPLANT
BLADE SURG 15 STRL SS SAFETY (BLADE) ×4 IMPLANT
CANISTER SUCT 1200ML W/VALVE (MISCELLANEOUS) ×2 IMPLANT
CHLORAPREP W/TINT 26 (MISCELLANEOUS) ×2 IMPLANT
COVER WAND RF STERILE (DRAPES) ×2 IMPLANT
DECANTER SPIKE VIAL GLASS SM (MISCELLANEOUS) ×2 IMPLANT
DRAIN PENROSE 1/4X12 LTX STRL (WOUND CARE) ×2 IMPLANT
DRAPE LAPAROTOMY 100X77 ABD (DRAPES) ×2 IMPLANT
DRSG TEGADERM 4X4.75 (GAUZE/BANDAGES/DRESSINGS) ×2 IMPLANT
DRSG TELFA 4X3 1S NADH ST (GAUZE/BANDAGES/DRESSINGS) ×2 IMPLANT
ELECT REM PT RETURN 9FT ADLT (ELECTROSURGICAL) ×2
ELECTRODE REM PT RTRN 9FT ADLT (ELECTROSURGICAL) ×1 IMPLANT
GLOVE BIO SURGEON STRL SZ7.5 (GLOVE) ×2 IMPLANT
GLOVE INDICATOR 8.0 STRL GRN (GLOVE) ×2 IMPLANT
GOWN STRL REUS W/ TWL LRG LVL3 (GOWN DISPOSABLE) ×2 IMPLANT
GOWN STRL REUS W/TWL LRG LVL3 (GOWN DISPOSABLE) ×4
KIT TURNOVER KIT A (KITS) ×2 IMPLANT
LABEL OR SOLS (LABEL) ×2 IMPLANT
MESH HERNIA 6X12 ULTRAPRO MED (Mesh General) ×1 IMPLANT
MESH HERNIA ULTRAPRO MED (Mesh General) ×1 IMPLANT
NEEDLE HYPO 22GX1.5 SAFETY (NEEDLE) ×4 IMPLANT
PACK BASIN MINOR (MISCELLANEOUS) ×2 IMPLANT
PAD TELFA 2X3 NADH STRL (GAUZE/BANDAGES/DRESSINGS) ×1 IMPLANT
STRIP CLOSURE SKIN 1/2X4 (GAUZE/BANDAGES/DRESSINGS) ×2 IMPLANT
SUT PDS AB 0 CT1 27 (SUTURE) ×2 IMPLANT
SUT SURGILON 0 BLK (SUTURE) ×4 IMPLANT
SUT VIC AB 2-0 SH 27 (SUTURE) ×2
SUT VIC AB 2-0 SH 27XBRD (SUTURE) ×1 IMPLANT
SUT VIC AB 3-0 54X BRD REEL (SUTURE) ×1 IMPLANT
SUT VIC AB 3-0 BRD 54 (SUTURE) ×2
SUT VIC AB 3-0 SH 27 (SUTURE) ×2
SUT VIC AB 3-0 SH 27X BRD (SUTURE) ×1 IMPLANT
SUT VIC AB 4-0 FS2 27 (SUTURE) ×2 IMPLANT
SWABSTK COMLB BENZOIN TINCTURE (MISCELLANEOUS) ×2 IMPLANT
SYR 10ML LL (SYRINGE) ×2 IMPLANT
SYR 3ML LL SCALE MARK (SYRINGE) ×2 IMPLANT

## 2020-11-01 NOTE — H&P (Signed)
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  Surgery had been postponed secondary to Covid infection, now resolved.    Medications Prior to Admission  Medication Sig Dispense Refill Last Dose  . albuterol (PROVENTIL HFA;VENTOLIN HFA) 108 (90 Base) MCG/ACT inhaler Inhale 2 puffs into the lungs every 6 (six) hours as needed for wheezing or shortness of breath.   11/01/2020 at Unknown time  . atorvastatin (LIPITOR) 20 MG tablet TAKE 1 TABLET (20 MG TOTAL) BY MOUTH DAILY. (Patient taking differently: Take 20 mg by mouth at bedtime. ) 90 tablet 1 10/31/2020 at Unknown time  . b complex vitamins tablet Take 1 tablet by mouth daily.   Past Week at Unknown time  . cholecalciferol (VITAMIN D3) 25 MCG (1000 UT) tablet Take 2,000 Units by mouth daily.    Past Week at Unknown time  . docusate sodium (COLACE) 100 MG capsule Take 100 mg by mouth at bedtime.    Past Month at Unknown time  . ferrous sulfate 325 (65 FE) MG EC tablet Take 1 tablet (325 mg total) by mouth 2 (two) times daily with a meal. (Patient taking differently: Take 325 mg by mouth at bedtime. ) 60 tablet 2 10/31/2020 at Unknown time  . Fluticasone-Umeclidin-Vilant (TRELEGY ELLIPTA) 200-62.5-25 MCG/INH AEPB Inhale 1 puff into the lungs daily. 60 each 6 11/01/2020 at Unknown time  . glucose blood (FREESTYLE LITE) test strip USE AS INSTRUCTED TO CHECK SUGAR UP TO 3 TIMES DAILY 100 strip 12 11/01/2020 at Unknown time  . insulin degludec (TRESIBA FLEXTOUCH) 100 UNIT/ML FlexTouch Pen Inject 0.2 mLs (20 Units total) into the skin at bedtime. (Patient taking differently: Inject 6 Units into the skin at bedtime. ) 15 mL 2 10/31/2020 at Unknown time  . JARDIANCE 25 MG TABS tablet TAKE 1 TABLET BY MOUTH ONCE DAILY (Patient taking differently: Take 25 mg by mouth daily. ) 90 tablet 1 10/31/2020 at Unknown time  . liraglutide (VICTOZA) 18 MG/3ML SOPN Inject 1.8 mg into the skin at bedtime. 9 mL 3 10/31/2020 at  Unknown time  . loratadine (CLARITIN) 10 MG tablet Take 10 mg by mouth at bedtime.    10/31/2020 at Unknown time  . losartan (COZAAR) 25 MG tablet TAKE 1 TABLET BY MOUTH DAILY. (Patient taking differently: Take 25 mg by mouth daily. ) 90 tablet 1 10/31/2020 at Unknown time  . metFORMIN (GLUCOPHAGE) 1000 MG tablet TAKE 1 TABLET BY MOUTH 2 TIMES DAILY WITH A MEAL. (Patient taking differently: Take 1,000 mg by mouth 2 (two) times daily with a meal. ) 180 tablet 3 10/28/2020 at Unknown time  . montelukast (SINGULAIR) 10 MG tablet TAKE 1 TABLET BY MOUTH AT BEDTIME (Patient taking differently: Take 10 mg by mouth at bedtime. ) 90 tablet 1 10/31/2020 at Unknown time  . Multiple Vitamin (MULTIVITAMIN WITH MINERALS) TABS tablet Take 1 tablet by mouth daily.   Past Month at Unknown time  . omeprazole (PRILOSEC) 20 MG capsule TAKE 1 CAPSULE BY MOUTH DAILY (Patient taking differently: Take 20 mg by mouth daily before breakfast. ) 90 capsule 1 11/01/2020 at Unknown time  . UNIFINE PENTIPS 32G X 4 MM MISC USE TWICE A DAY AS DIRECTED WITH DIABETES MEDICATION PENS 200 each 5 10/31/2020 at Unknown time  . venlafaxine XR (EFFEXOR-XR) 150 MG 24 hr capsule TAKE 1 CAPSULE BY MOUTH DAILY WITH BREAKFAST. (Patient taking differently: Take 150 mg by mouth daily with breakfast. ) 90  capsule 1 11/01/2020 at Unknown time  . Verapamil HCl CR 300 MG CP24 TAKE 1 CAPSULE BY MOUTH DAILY (Patient taking differently: Take 300 mg by mouth at bedtime. ) 90 capsule 1 10/31/2020 at Unknown time  . vitamin B-12 (CYANOCOBALAMIN) 1000 MCG tablet Take 1,000 mcg by mouth at bedtime.   Past Week at Unknown time  . Vitamin D, Ergocalciferol, (DRISDOL) 1.25 MG (50000 UNIT) CAPS capsule TAKE 1 CAPSULE BY MOUTH EVERY 7 DAYS. (Patient taking differently: Take 50,000 Units by mouth every Monday. ) 4 capsule 0 Past Month at Unknown time   Allergies  Allergen Reactions  . Sulfa Antibiotics Hives, Shortness Of Breath and Rash  . Lisinopril Cough  . Ramipril  Cough   Past Medical History:  Diagnosis Date  . Allergy    hay fever  . Anemia   . Ankle fracture, left 04/08/2014  . Anxiety and depression   . Arthritis   . Asthma    well controlled   . Blood transfusion abn reaction or complication, no procedure mishap   . Blood transfusion without reported diagnosis   . COVID-19 virus infection 08/2020  . Depression   . Diabetes mellitus   . Disc displacement, lumbar   . Dysrhythmia    pvc's  . Fatty liver   . GERD (gastroesophageal reflux disease)   . Headache(784.0)   . History of hiatal hernia    small  . Hyperlipidemia   . Hypertension   . Joint pain   . Low vitamin B12 level   . Low vitamin D level   . Migraine   . Mitral valve prolapse   . Obstructive sleep apnea    CPAP  . Osteoarthritis   . Palpitations   . PONV (postoperative nausea and vomiting)    h/o in the past  . Sleep apnea 04/06/2017   uses CPAP  . SOB (shortness of breath)   . UTI (lower urinary tract infection)   . Vitamin B 12 deficiency   . Vitamin D deficiency   . Wears glasses    Past Surgical History:  Procedure Laterality Date  . APPENDECTOMY  1977  . COLONOSCOPY  last 11/17/2014  . JOINT REPLACEMENT    . KNEE ARTHROPLASTY Right 03/03/2019   Procedure: COMPUTER ASSISTED TOTAL KNEE ARTHROPLASTY-RIGHT;  Surgeon: Dereck Leep, MD;  Location: [facility];  Service: Orthopedics;  Laterality: Right;  . LUMBAR LAMINECTOMY/DECOMPRESSION MICRODISCECTOMY Right 02/06/2018   Procedure: Lumbar two Hemilaminectomy for Discectomy;  Surgeon: Ashok Pall, MD;  Location: [facility];  Service: Neurosurgery;  Laterality: Right;  . ORIF ANKLE FRACTURE Left 04/08/2014   Procedure: OPEN REDUCTION INTERNAL FIXATION (ORIF) ANKLE FRACTURE LEFT ANKLE FRACTURE OPEN TREATMENT BIMALLEOLAR ANKLE INCLUDES INTERNAL FIXATION;  Surgeon: Johnny Bridge, MD;  Location: [place] [place];  Service: Orthopedics;  Laterality: Left;  . PAROTIDECTOMY  1980   left  . POLYPECTOMY    .  TONSILLECTOMY AND ADENOIDECTOMY     as a child  . TOTAL KNEE ARTHROPLASTY Bilateral    left  . TUBAL LIGATION  05/1976   Social History   Socioeconomic History  . Marital status: Widowed    Spouse name: Not on file  . Number of children: Not on file  . Years of education: Not on file  . Highest education level: Not on file  Occupational History  . 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 Smoking status: Never Smoker  . Smokeless tobacco: Never Used  Vaping Use  .  Vaping Use: Never used  Substance and Sexual Activity  . Alcohol use: No  . Drug use: No  . Sexual activity: Not on file  Other Topics Concern  . Not on file  Social History Narrative   Lives in [place] with daughter and twin grandchildren 9YO. Husband deceased 03/26/2008.      Work - [place] [employer]- [employer], [employer]   Diet - healthy   Exercise - gym 2 x per week   Social Determinants of Health   Financial Resource Strain:   . Difficulty of Paying Living Expenses: Not on file  Food Insecurity:   . Worried About Charity fundraiser in the Last Year: Not on file  . Ran Out of Food in the Last Year: Not on file  Transportation Needs:   . Lack of Transportation (Medical): Not on file  . Lack of Transportation (Non-Medical): Not on file  Physical Activity:   . Days of Exercise per Week: Not on file  . Minutes of Exercise per Session: Not on file  Stress:   . Feeling of Stress : Not on file  Social Connections:   . Frequency of Communication with Friends and Family: Not on file  . Frequency of Social Gatherings with Friends and Family: Not on file  . Attends Religious Services: Not on file  . Active Member of Clubs or Organizations: Not on file  . Attends Archivist Meetings: Not on file  . Marital Status: Not on file  Intimate Partner Violence:   . Fear of Current or Ex-Partner: Not on file  . Emotionally Abused: Not on file  . Physically Abused: Not on file  . Sexually Abused: Not on  file   Social History   Social History Narrative   Lives in [place] with daughter and twin grandchildren 9YO. Husband deceased 2008/03/26.      Work - [place] [employer]- [employer]iffany [employer], [employer]   Diet - healthy   Exercise - gym 2 x per week     ROS: Negative.     PE: HEENT: Negative. Lungs: Clear. Cardio: RR.   Assessment/Plan:  Proceed with planned right inguinal hernia repair.    Forest Gleason [facility] 11/01/2020

## 2020-11-01 NOTE — Anesthesia Procedure Notes (Signed)
Procedure Name: LMA Insertion Date/Time: 11/01/2020 4:05 PM Performed by: Nelda Marseille, CRNA Pre-anesthesia Checklist: Patient identified, Patient being monitored, Timeout performed, Emergency Drugs available and Suction available Patient Re-evaluated:Patient Re-evaluated prior to induction Oxygen Delivery Method: Circle system utilized Preoxygenation: Pre-oxygenation with 100% oxygen Induction Type: IV induction Ventilation: Mask ventilation without difficulty LMA: LMA inserted LMA Size: 4.0 Tube type: Oral Number of attempts: 1 Placement Confirmation: positive ETCO2 and breath sounds checked- equal and bilateral Tube secured with: Tape Dental Injury: Teeth and Oropharynx as per pre-operative assessment

## 2020-11-01 NOTE — Discharge Instructions (Signed)

## 2020-11-01 NOTE — Anesthesia Preprocedure Evaluation (Addendum)
Anesthesia Evaluation  Patient identified by MRN, date of birth, ID band Patient awake    Reviewed: Allergy & Precautions, H&P , NPO status , Patient's Chart, lab work & pertinent test results  History of Anesthesia Complications (+) PONV  Airway Mallampati: III  TM Distance: >3 FB Neck ROM: full    Dental  (+) Chipped   Pulmonary asthma , sleep apnea , neg COPD,    breath sounds clear to auscultation       Cardiovascular hypertension, (-) angina(-) Past MI and (-) Cardiac Stents (-) dysrhythmias  Rhythm:regular Rate:Normal     Neuro/Psych  Headaches, PSYCHIATRIC DISORDERS Anxiety Depression    GI/Hepatic Neg liver ROS, hiatal hernia, GERD  ,  Endo/Other  diabetes  Renal/GU      Musculoskeletal   Abdominal   Peds  Hematology negative hematology ROS (+)   Anesthesia Other Findings Past Medical History: No date: Allergy     Comment:  hay fever No date: Anemia 04/08/2014: Ankle fracture, left No date: Anxiety and depression No date: Arthritis No date: Asthma     Comment:  well controlled  No date: Blood transfusion abn reaction or complication, no procedure  mishap No date: Blood transfusion without reported diagnosis 08/2020: COVID-19 virus infection No date: Depression No date: Diabetes mellitus 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, lumbar No date: Dysrhythmia     Comment:  pvc's No date: Fatty liver No date: GERD (gastroesophageal reflux disease) No date: Headache(784.0) No date: History of hiatal hernia     Comment:  small No date: Hyperlipidemia No date: Hypertension No date: Joint pain No date: Low vitamin B12 level No date: Low vitamin D level No date: Migraine No date: Mitral valve prolapse No date: Obstructive sleep apnea     Comment:  CPAP No date: Osteoarthritis No date: Palpitations No date: PONV (postoperative nausea and vomiting)     Comment:  h/o in the past 04/06/2017: Sleep apnea      Comment:  uses CPAP No date: SOB (shortness of breath) No date: UTI (lower urinary tract infection) No date: Vitamin B 12 deficiency No date: Vitamin D deficiency No date: Wears glasses  Past Surgical History: 1977: APPENDECTOMY last 11/17/2014: COLONOSCOPY No date: JOINT REPLACEMENT 03/03/2019: KNEE ARTHROPLASTY; Right     Comment:  Procedure: COMPUTER ASSISTED TOTAL KNEE               ARTHROPLASTY-RIGHT;  Surgeon: Dereck Leep, MD;                Location: [facility];  Service: Orthopedics;  Laterality:               Right; 02/06/2018: LUMBAR LAMINECTOMY/DECOMPRESSION MICRODISCECTOMY; Right     Comment:  Procedure: Lumbar two Hemilaminectomy for Discectomy;                Surgeon: Ashok Pall, MD;  Location: [facility];  Service:               Neurosurgery;  Laterality: Right; 04/08/2014: ORIF ANKLE FRACTURE; Left     Comment:  Procedure: OPEN REDUCTION INTERNAL FIXATION (ORIF) ANKLE              FRACTURE LEFT ANKLE FRACTURE OPEN TREATMENT BIMALLEOLAR               ANKLE INCLUDES INTERNAL FIXATION;  Surgeon: Johnny Bridge, MD;  Location: [facility];  Service: Orthopedics;  Laterality: Left; 1980: PAROTIDECTOMY     Comment:  left No date: POLYPECTOMY No date: TONSILLECTOMY AND ADENOIDECTOMY     Comment:  as a child No date: TOTAL KNEE ARTHROPLASTY; Bilateral     Comment:  left 05/1976: TUBAL LIGATION  BMI    Body Mass Index: 32.61 kg/m      Reproductive/Obstetrics negative OB ROS                            Anesthesia Physical Anesthesia Plan  ASA: III  Anesthesia Plan: General LMA   Post-op Pain Management:    Induction:   PONV Risk Score and Plan: Dexamethasone, Ondansetron and Treatment may vary due to age or medical condition  Airway Management Planned:   Additional Equipment:   Intra-op Plan:   Post-operative Plan:   Informed Consent: I have reviewed the patients History and Physical,  chart, labs and discussed the procedure including the risks, benefits and alternatives for the proposed anesthesia with the patient or authorized representative who has indicated his/her understanding and acceptance.     Dental Advisory Given  Plan Discussed with: Anesthesiologist, CRNA and Surgeon  Anesthesia Plan Comments:         Anesthesia Quick Evaluation

## 2020-11-01 NOTE — Anesthesia Postprocedure Evaluation (Signed)
Anesthesia Post Note  Patient: Vanessa Higgins  Procedure(s) Performed: HERNIA REPAIR INGUINAL ADULT (Right Abdomen)  Patient location during evaluation: PACU Anesthesia Type: General Level of consciousness: awake and alert Pain management: pain level controlled Vital Signs Assessment: post-procedure vital signs reviewed and stable Respiratory status: spontaneous breathing, nonlabored ventilation, respiratory function stable and patient connected to nasal cannula oxygen Cardiovascular status: blood pressure returned to baseline and stable Postop Assessment: no apparent nausea or vomiting Anesthetic complications: no   No complications documented.   Last Vitals:  Vitals:   11/01/20 1735 11/01/20 1750  BP: 132/69 (!) 162/89  Pulse: 80 79  Resp: 10 20  Temp:  (!) 36.3 C  SpO2: 95% 94%    Last Pain:  Vitals:   11/01/20 1750  TempSrc: Temporal  PainSc: 0-No pain                 Arita Miss

## 2020-11-01 NOTE — Op Note (Signed)
Preoperative diagnosis: Right inguinal hernia.  Postoperative diagnosis: Same.  Operative procedure: Repair of right indirect inguinal hernia with medium Ultra Pro mesh.  Operating surgeon: Hervey Ard, MD.  Anesthesia: General by LMA, Marcaine 0.5% with 1: 200,000's of epinephrine, 30 cc, Toradol 30 mg.  Estimated blood loss: Less than 5 cc.  Clinical note: This 69 year old woman was identified with a right inguinal hernia on a CT scan for occult bleeding.  She is asymptomatic.  She is admitted at this time for elective repair.  She received Ancef prior to the procedure.  SCD stockings for DVT prevention.  Operative note: With the patient under adequate general anesthesia the abdomen was prepped with ChloraPrep and draped.  Tape had been used to lift the abdominal panniculus to better expose the inguinal area.  A 6 cm skin line incision along the anticipated course the inguinal canal was carried out after placement of a field block.  The skin was incised sharply and the remaining dissection completed with electrocautery.  Hemostasis was electrocautery and 3-0 Vicryl ties.  The external oblique was opened in the direction of its fibers.  The large indirect ring was identified and the hernia sac freed circumferentially and returned to the preperitoneal space.  A medium Ultra Pro mesh was smoothed into position.  This was anchored to the pubic tubercle medially and then along the inguinal ligament with interrupted 0 Surgilon sutures.  The medial and superior borders were were anchored to the transverse abdominis aponeurosis.  The ilioinguinal nerve appeared to have been divided and this was ligated with a 3-0 Vicryl tied to minimize neuroma formation.  The external oblique was closed with a running 2-0 Vicryl suture.  Scarpa's fascia was closed with a running 3-0 Vicryl suture.  An additional layer of a 3-0 Vicryl sutures were used to close the superficial adipose layer and the skin closed with a  running 4-0 Vicryl subcuticular suture.  Benzoin, Steri-Strips, Telfa and Tegaderm dressings were applied.  Patient tolerated the procedure well and was taken recovery room in stable condition.

## 2020-11-01 NOTE — Transfer of Care (Signed)
Immediate Anesthesia Transfer of Care Note  Patient: Vanessa Higgins  Procedure(s) Performed: HERNIA REPAIR INGUINAL ADULT (Right Abdomen)  Patient Location: PACU  Anesthesia Type:General  Level of Consciousness: awake, alert  and oriented  Airway & Oxygen Therapy: Patient Spontanous Breathing and Patient connected to face mask oxygen  Post-op Assessment: Report given to RN and Post -op Vital signs reviewed and stable  Post vital signs: Reviewed and stable  Last Vitals:  Vitals Value Taken Time  BP    Temp    Pulse    Resp    SpO2      Last Pain:  Vitals:   11/01/20 1346  TempSrc: Temporal  PainSc: 0-No pain         Complications: No complications documented.

## 2020-11-02 ENCOUNTER — Encounter: Payer: Self-pay | Admitting: General Surgery

## 2020-11-09 ENCOUNTER — Encounter (INDEPENDENT_AMBULATORY_CARE_PROVIDER_SITE_OTHER): Payer: Self-pay | Admitting: Bariatrics

## 2020-11-09 ENCOUNTER — Other Ambulatory Visit (INDEPENDENT_AMBULATORY_CARE_PROVIDER_SITE_OTHER): Payer: Self-pay | Admitting: Bariatrics

## 2020-11-09 ENCOUNTER — Other Ambulatory Visit: Payer: Self-pay

## 2020-11-09 ENCOUNTER — Ambulatory Visit (INDEPENDENT_AMBULATORY_CARE_PROVIDER_SITE_OTHER): Payer: No Typology Code available for payment source | Admitting: Bariatrics

## 2020-11-09 VITALS — BP 148/77 | HR 74 | Temp 98.0°F | Ht 63.0 in | Wt 183.0 lb

## 2020-11-09 DIAGNOSIS — I10 Essential (primary) hypertension: Secondary | ICD-10-CM | POA: Diagnosis not present

## 2020-11-09 DIAGNOSIS — E1169 Type 2 diabetes mellitus with other specified complication: Secondary | ICD-10-CM | POA: Diagnosis not present

## 2020-11-09 DIAGNOSIS — Z9189 Other specified personal risk factors, not elsewhere classified: Secondary | ICD-10-CM

## 2020-11-09 DIAGNOSIS — Z6832 Body mass index (BMI) 32.0-32.9, adult: Secondary | ICD-10-CM

## 2020-11-09 DIAGNOSIS — E559 Vitamin D deficiency, unspecified: Secondary | ICD-10-CM | POA: Diagnosis not present

## 2020-11-09 DIAGNOSIS — E669 Obesity, unspecified: Secondary | ICD-10-CM

## 2020-11-09 DIAGNOSIS — Z794 Long term (current) use of insulin: Secondary | ICD-10-CM

## 2020-11-09 MED ORDER — VITAMIN D (ERGOCALCIFEROL) 1.25 MG (50000 UNIT) PO CAPS: ORAL_CAPSULE | ORAL | 0 refills | Status: DC

## 2020-11-09 MED ORDER — TELMISARTAN-HCTZ 80-25 MG PO TABS: 1.0000 | ORAL_TABLET | Freq: Every day | ORAL | 0 refills | Status: DC

## 2020-11-09 NOTE — Progress Notes (Signed)
Chief Complaint:   OBESITY Vanessa Higgins is here to discuss her progress with her obesity treatment plan along with follow-up of her obesity related diagnoses. Vanessa Higgins is on the Category 2 Plan and states she is following her eating plan approximately 40% of the time. Vanessa Higgins states she is exercising 0 minutes 0 times per week.  Today's visit was #: 10 Starting weight: 205 lbs Starting date: 05/10/2020 Today's weight: 183 lbs Today's date: 11/09/2020 Total lbs lost to date: 22 Total lbs lost since last in-office visit: 0  Interim History: Vanessa Higgins is up 5 lbs. She has not been seen for 6 weeks and reports being back on track for 1 week. She reports doing well with her water intake.  Subjective:   Vitamin D deficiency. Vanessa Higgins endorses minimal sun exposure.   Ref. Range 05/10/2020 15:46  Vitamin D, 25-Hydroxy Latest Ref Range: 30.0 - 100.0 ng/mL 36.9   Essential hypertension. Vanessa Higgins reports systolic blood pressures run 532-992 with diastolics 426 at home. Blood pressure is reasonably well controlled.  BP Readings from Last 3 Encounters:  11/09/20 (!) 148/77  11/01/20 (!) 162/89  09/27/20 119/78   Lab Results  Component Value Date   CREATININE 0.53 09/12/2020   CREATININE 0.59 07/12/2020   CREATININE 0.62 06/21/2020   Type 2 diabetes mellitus with other specified complication, with long-term current use of insulin ([place]). Vanessa Higgins is taking Jardiance, Tyler Aas, Victoza, and metformin. She denies low blood sugars.   Lab Results  Component Value Date   HGBA1C 6.0 (H) 07/12/2020   HGBA1C 6.9 (H) 03/22/2020   HGBA1C 7.1 (H) 10/12/2019   Lab Results  Component Value Date   MICROALBUR 1.6 03/22/2020   LDLCALC 49 07/12/2020   CREATININE 0.53 09/12/2020   No results found for: INSULIN  At risk for osteoporosis. Vanessa Higgins is at higher risk of osteopenia and osteoporosis due to Vitamin D deficiency.   Assessment/Plan:   Vitamin D deficiency. Low Vitamin D level contributes to fatigue  and are associated with obesity, breast, and colon cancer. She was given a prescription for Vitamin D, Ergocalciferol, (DRISDOL) 1.25 MG (50000 UNIT) CAPS capsule every week #4 with 0 refills and will follow-up for routine testing of Vitamin D, at least 2-3 times per year to avoid over-replacement.   Essential hypertension. Vanessa Higgins is working on healthy weight loss and exercise to improve blood pressure control. We will watch for signs of hypotension as she continues her lifestyle modifications. Vanessa Higgins will continue her medication and continue monitoring her blood pressure at home. New prescription was given for telmisartan-hydrochlorothiazide (MICARDIS HCT) 80-25 MG tablet #30 with 0 refills.  Type 2 diabetes mellitus with other specified complication, with long-term current use of insulin (Vanessa Higgins). Vanessa Higgins was given approximately 15 minutes of diabetes education and counseling today. We discussed intensive lifestyle modifications today with an emphasis on weight loss as well as increasing exercise and decreasing simple carbohydrates in her diet. We also reviewed medication options with an emphasis on risk versus benefit of those discussed. Vanessa Higgins will continue her medications as directed.   Repetitive spaced learning was employed today to elicit superior memory formation and behavioral change.  At risk for osteoporosis. Vanessa Higgins was given approximately 15 minutes of osteoporosis prevention counseling today. Vanessa Higgins is at risk for osteopenia and osteoporosis due to her Vitamin D deficiency. She was encouraged to take her Vitamin D and follow her higher calcium diet and increase strengthening exercise to help strengthen her bones and decrease her risk of osteopenia and  osteoporosis.  Repetitive spaced learning was employed today to elicit superior memory formation and behavioral change.  Class 1 obesity with serious comorbidity and body mass index (BMI) of 32.0 to 32.9 in adult, unspecified obesity type.  Vanessa Higgins is  currently in the action stage of change. As such, her goal is to continue with weight loss efforts. She has agreed to the Category 2 Plan.   She will work on meal planning, intentional eating, and will avoid sugary drinks.  Exercise goals: Vanessa Higgins will see the surgeon today and will resume exercise.  Behavioral modification strategies: increasing lean protein intake, decreasing simple carbohydrates, increasing vegetables, increasing water intake, decreasing eating out, no skipping meals, meal planning and cooking strategies, keeping healthy foods in the home and planning for success.  Vanessa Higgins has agreed to follow-up with our clinic in 3 weeks. She was informed of the importance of frequent follow-up visits to maximize her success with intensive lifestyle modifications for her multiple health conditions.   Objective:   Blood pressure (!) 148/77, pulse 74, temperature 98 F (36.7 C), height 5\' 3"  (1.6 m), weight 183 lb (83 kg), SpO2 94 %. Body mass index is 32.42 kg/m.  General: Cooperative, alert, well developed, in no acute distress. HEENT: Conjunctivae and lids unremarkable. Cardiovascular: Regular rhythm.  Lungs: Normal work of breathing. Neurologic: No focal deficits.   Lab Results  Component Value Date   CREATININE 0.53 09/12/2020   BUN 12 09/12/2020   NA 138 09/12/2020   K 3.7 09/12/2020   CL 99 09/12/2020   CO2 26 09/12/2020   Lab Results  Component Value Date   ALT 21 09/12/2020   AST 26 09/12/2020   ALKPHOS 38 09/12/2020   BILITOT 1.0 09/12/2020   Lab Results  Component Value Date   HGBA1C 6.0 (H) 07/12/2020   HGBA1C 6.9 (H) 03/22/2020   HGBA1C 7.1 (H) 10/12/2019   HGBA1C 7.3 (H) 06/10/2019   HGBA1C 6.8 (H) 02/18/2019   No results found for: INSULIN Lab Results  Component Value Date   TSH 1.290 05/10/2020   Lab Results  Component Value Date   CHOL 105 07/12/2020   HDL 41 07/12/2020   LDLCALC 49 07/12/2020   TRIG 70 07/12/2020   CHOLHDL 5 03/22/2020    Lab Results  Component Value Date   WBC 8.1 09/12/2020   HGB 13.0 09/12/2020   HCT 43.1 09/12/2020   MCV 82.7 09/12/2020   PLT 452 (H) 09/12/2020   Lab Results  Component Value Date   IRON 26 (L) 07/12/2020   TIBC 401 07/12/2020   FERRITIN 8 (L) 07/12/2020   Attestation Statements:   Reviewed by clinician on day of visit: allergies, medications, problem list, medical history, surgical history, family history, social history, and previous encounter notes.  Migdalia Dk, am acting as Location manager for [employer], DO   I have reviewed the above documentation for accuracy and completeness, and I agree with the above. Jearld Lesch, DO

## 2020-11-13 ENCOUNTER — Encounter (INDEPENDENT_AMBULATORY_CARE_PROVIDER_SITE_OTHER): Payer: Self-pay | Admitting: Bariatrics

## 2020-11-20 ENCOUNTER — Other Ambulatory Visit: Payer: Self-pay | Admitting: Family

## 2020-11-20 ENCOUNTER — Other Ambulatory Visit: Payer: Self-pay | Admitting: Pulmonary Disease

## 2020-11-20 DIAGNOSIS — E119 Type 2 diabetes mellitus without complications: Secondary | ICD-10-CM

## 2020-11-29 ENCOUNTER — Encounter (INDEPENDENT_AMBULATORY_CARE_PROVIDER_SITE_OTHER): Payer: Self-pay | Admitting: Bariatrics

## 2020-11-29 ENCOUNTER — Ambulatory Visit (INDEPENDENT_AMBULATORY_CARE_PROVIDER_SITE_OTHER): Payer: No Typology Code available for payment source | Admitting: Bariatrics

## 2020-11-29 ENCOUNTER — Other Ambulatory Visit: Payer: Self-pay

## 2020-11-29 VITALS — BP 126/77 | HR 74 | Temp 98.1°F | Ht 63.0 in | Wt 184.0 lb

## 2020-11-29 DIAGNOSIS — I152 Hypertension secondary to endocrine disorders: Secondary | ICD-10-CM

## 2020-11-29 DIAGNOSIS — E1159 Type 2 diabetes mellitus with other circulatory complications: Secondary | ICD-10-CM | POA: Diagnosis not present

## 2020-11-29 DIAGNOSIS — E669 Obesity, unspecified: Secondary | ICD-10-CM

## 2020-11-29 DIAGNOSIS — E559 Vitamin D deficiency, unspecified: Secondary | ICD-10-CM

## 2020-11-29 DIAGNOSIS — Z9189 Other specified personal risk factors, not elsewhere classified: Secondary | ICD-10-CM | POA: Diagnosis not present

## 2020-11-29 NOTE — Progress Notes (Signed)
Chief Complaint:   OBESITY Vanessa Higgins is here to discuss her progress with her obesity treatment plan along with follow-up of her obesity related diagnoses. Vanessa Higgins is on the Category 2 Plan and states she is following her eating plan approximately 50% of the time. [name] states she is exercising 0 minutes 0 times per week.  Today's visit was #: 11 Starting weight: 205 lbs Starting date: 05/10/2020 Today's weight: 184 lbs Today's date: 11/29/2020 Total lbs lost to date: 21 Total lbs lost since last in-office visit: 0  Interim History: Vanessa Higgins is up 1 lb since her last visit, but has done well overall. She reports doing well with her water and protein intake.  Subjective:   Vitamin D deficiency. Virgilene is taking Vitamin D supplementation.    Ref. Range 05/10/2020 15:46  Vitamin D, 25-Hydroxy Latest Ref Range: 30.0 - 100.0 ng/mL 36.9   Hypertension associated with type 2 diabetes mellitus ([place]). Hayleigh is taking Verapamil and Micardis-HCT.  BP Readings from Last 3 Encounters:  11/29/20 126/77  11/09/20 (!) 148/77  11/01/20 (!) 162/89   Lab Results  Component Value Date   CREATININE 0.53 09/12/2020   CREATININE 0.59 07/12/2020   CREATININE 0.62 06/21/2020   Assessment/Plan:   Vitamin D deficiency. Low Vitamin D level contributes to fatigue and are associated with obesity, breast, and colon cancer. She agrees to continue to take Vitamin D as directed and will follow-up for routine testing of Vitamin D, at least 2-3 times per year to avoid over-replacement.  Hypertension associated with type 2 diabetes mellitus ([place]). Vanessa Higgins is working on healthy weight loss and exercise to improve blood pressure control. We will watch for signs of hypotension as she continues her lifestyle modifications. She will continue her medications as directed.   Class 1 obesity with serious comorbidity and body mass index (BMI) of 32.0 to 32.9 in adult, unspecified obesity type.  Vanessa Higgins is currently in  the action stage of change. As such, her goal is to continue with weight loss efforts. She has agreed to the Category 2 Plan.   She will work on meal planning and intentional eating.   Exercise goals: Vanessa Higgins will continue walking every other day.  Behavioral modification strategies: increasing lean protein intake, decreasing simple carbohydrates, increasing vegetables, increasing water intake, decreasing eating out, no skipping meals, meal planning and cooking strategies, keeping healthy foods in the home and planning for success.  Vanessa Higgins has agreed to follow-up with our clinic in 2-3 weeks. She was informed of the importance of frequent follow-up visits to maximize her success with intensive lifestyle modifications for her multiple health conditions.   Objective:   Blood pressure 126/77, pulse 74, temperature 98.1 F (36.7 C), height 5\' 3"  (1.6 m), weight 184 lb (83.5 kg), SpO2 94 %. Body mass index is 32.59 kg/m.  General: Cooperative, alert, well developed, in no acute distress. HEENT: Conjunctivae and lids unremarkable. Cardiovascular: Regular rhythm.  Lungs: Normal work of breathing. Neurologic: No focal deficits.   Lab Results  Component Value Date   CREATININE 0.53 09/12/2020   BUN 12 09/12/2020   NA 138 09/12/2020   K 3.7 09/12/2020   CL 99 09/12/2020   CO2 26 09/12/2020   Lab Results  Component Value Date   ALT 21 09/12/2020   AST 26 09/12/2020   ALKPHOS 38 09/12/2020   BILITOT 1.0 09/12/2020   Lab Results  Component Value Date   HGBA1C 6.0 (H) 07/12/2020   HGBA1C 6.9 (H) 03/22/2020  HGBA1C 7.1 (H) 10/12/2019   HGBA1C 7.3 (H) 06/10/2019   HGBA1C 6.8 (H) 02/18/2019   No results found for: INSULIN Lab Results  Component Value Date   TSH 1.290 05/10/2020   Lab Results  Component Value Date   CHOL 105 07/12/2020   HDL 41 07/12/2020   LDLCALC 49 07/12/2020   TRIG 70 07/12/2020   CHOLHDL 5 03/22/2020   Lab Results  Component Value Date   WBC 8.1  09/12/2020   HGB 13.0 09/12/2020   HCT 43.1 09/12/2020   MCV 82.7 09/12/2020   PLT 452 (H) 09/12/2020   Lab Results  Component Value Date   IRON 26 (L) 07/12/2020   TIBC 401 07/12/2020   FERRITIN 8 (L) 07/12/2020   Obesity Behavioral Intervention:   Approximately 15 minutes were spent on the discussion below.  ASK: We discussed the diagnosis of obesity with [name] today and [name] agreed to give [name] permission to discuss obesity behavioral modification therapy today.  ASSESS: Vanessa Higgins has the diagnosis of obesity and her BMI today is 32.6. Vanessa Higgins is in the action stage of change.   ADVISE: Vanessa Higgins was educated on the multiple health risks of obesity as well as the benefit of weight loss to improve her health. She was advised of the need for long term treatment and the importance of lifestyle modifications to improve her current health and to decrease her risk of future health problems.  AGREE: Multiple dietary modification options and treatment options were discussed and Vanessa Higgins agreed to follow the recommendations documented in the above note.  ARRANGE: Vanessa Higgins was educated on the importance of frequent visits to treat obesity as outlined per CMS and USPSTF guidelines and agreed to schedule her next follow up appointment today.  Attestation Statements:   Reviewed by clinician on day of visit: allergies, medications, problem list, medical history, surgical history, family history, social history, and previous encounter notes.  Migdalia Dk, am acting as Location manager for [employer], DO   I have reviewed the above documentation for accuracy and completeness, and I agree with the above. Jearld Lesch, DO

## 2020-11-30 ENCOUNTER — Encounter (INDEPENDENT_AMBULATORY_CARE_PROVIDER_SITE_OTHER): Payer: Self-pay | Admitting: Bariatrics

## 2020-12-06 ENCOUNTER — Encounter (INDEPENDENT_AMBULATORY_CARE_PROVIDER_SITE_OTHER): Payer: Self-pay

## 2020-12-06 ENCOUNTER — Other Ambulatory Visit (INDEPENDENT_AMBULATORY_CARE_PROVIDER_SITE_OTHER): Payer: Self-pay | Admitting: Bariatrics

## 2020-12-06 ENCOUNTER — Encounter: Payer: Self-pay | Admitting: Gastroenterology

## 2020-12-06 ENCOUNTER — Ambulatory Visit: Payer: No Typology Code available for payment source | Admitting: Gastroenterology

## 2020-12-06 VITALS — BP 126/70 | HR 69 | Ht 63.0 in | Wt 188.1 lb

## 2020-12-06 DIAGNOSIS — D509 Iron deficiency anemia, unspecified: Secondary | ICD-10-CM

## 2020-12-06 DIAGNOSIS — I10 Essential (primary) hypertension: Secondary | ICD-10-CM

## 2020-12-06 DIAGNOSIS — K219 Gastro-esophageal reflux disease without esophagitis: Secondary | ICD-10-CM | POA: Diagnosis not present

## 2020-12-06 NOTE — Telephone Encounter (Signed)
Message sent to pt.

## 2020-12-06 NOTE — Progress Notes (Signed)
Chief Complaint: FU  Referring Provider:  Burnard Hawthorne, FNP      ASSESSMENT AND PLAN;   #1.  IDA (resolved) with FOBT neg stools. Neg EGD, colon, CTA.   #2.  GERD with small HH  Plan: -Decrease iron 1/day -Can continue omeprazole 20 mg p.o. QD -Call me in case of any problems.  Have given her all contact numbers. 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  Wants to hold off on any further evaluation by means of VCE or EUS.  Denies having any nausea, heartburn, regurgitation odynophagia or dysphagia.  She denies having any diarrhea or constipation.  Iron deficiency anemia has resolved.  Her most recent hemoglobin was 13.0.  Has been trying to lose wt.  Diabetes under good control.  Past GI work-up: Colonoscopy 11/2019 -Moderate left colonic diverticulosis. -Otherwise normal colonoscopy to TI. EGD 06/2020 -Small hiatal hernia -Mildly prominent gastric folds. Bx- neg, fundic gland polyps. -Neg SB bx for celiac  CTA 06/26/2020 1. No evidence of significant mesenteric arterial occlusive disease. 2. Moderate to large sized right inguinal hernia containing some distal small bowel with some associated fecalization of contents. No overt signs by CT of associated bowel obstruction or incarcerated hernia. 3. Possible degree of gastric wall thickening especially involving the proximal to mid stomach. This is nonspecific but may implicate gastric pathology such as gastritis or infiltrative disease. Correlation with EGD may be helpful. 4. Sigmoid diverticulosis without evidence of diverticulitis. 5. No evidence of active gastrointestinal bleeding by CTA or vascular malformation.  Past Medical History:  Diagnosis Date  . Allergy    hay fever  . Anemia   . Ankle fracture, left  04/08/2014  . Anxiety and depression   . Arthritis   . Asthma    well controlled   . Blood transfusion abn reaction or complication, no procedure mishap   . Blood transfusion without reported diagnosis   . COVID-19 virus infection 08/2020  . Depression   . Diabetes mellitus   . Disc displacement, lumbar   . Dysrhythmia    pvc's  . Fatty liver   . GERD (gastroesophageal reflux disease)   . Headache(784.0)   . History of hiatal hernia    small  . Hyperlipidemia   . Hypertension   . Joint pain   . Low vitamin B12 level   . Low vitamin D level   . Migraine   . Mitral valve prolapse   . Obstructive sleep apnea    CPAP  . Osteoarthritis   . Palpitations   . PONV (postoperative nausea and vomiting)    h/o in the past  . Sleep apnea 04/06/2017   uses CPAP  . SOB (shortness of breath)   . UTI (lower urinary tract infection)   . Vitamin B 12 deficiency   . Vitamin D deficiency   . Wears glasses     Past Surgical History:  Procedure Laterality Date  . APPENDECTOMY  1977  . COLONOSCOPY  last 11/17/2014  . INGUINAL HERNIA REPAIR Right 11/01/2020   Procedure: HERNIA REPAIR INGUINAL ADULT;  Surgeon: Robert Bellow, MD;  Location: [facility];  Service: General;  Laterality: Right;  . JOINT REPLACEMENT    . KNEE ARTHROPLASTY Right 03/03/2019   Procedure: COMPUTER  ASSISTED TOTAL KNEE ARTHROPLASTY-RIGHT;  Surgeon: Dereck Leep, MD;  Location: [facility];  Service: Orthopedics;  Laterality: Right;  . LUMBAR LAMINECTOMY/DECOMPRESSION MICRODISCECTOMY Right 02/06/2018   Procedure: Lumbar two Hemilaminectomy for Discectomy;  Surgeon: Ashok Pall, MD;  Location: [facility];  Service: Neurosurgery;  Laterality: Right;  . ORIF ANKLE FRACTURE Left 04/08/2014   Procedure: OPEN REDUCTION INTERNAL FIXATION (ORIF) ANKLE FRACTURE LEFT ANKLE FRACTURE OPEN TREATMENT BIMALLEOLAR ANKLE INCLUDES INTERNAL FIXATION;  Surgeon: Johnny Bridge, MD;  Location: [facility];  Service: Orthopedics;   Laterality: Left;  . PAROTIDECTOMY  1980   left  . POLYPECTOMY    . TONSILLECTOMY AND ADENOIDECTOMY     as a child  . TOTAL KNEE ARTHROPLASTY Bilateral    left  . TUBAL LIGATION  05/1976    Family History  Problem Relation Age of Onset  . Sudden death Mother   . Cancer Mother 11       pancreatic  . Heart disease Father 23  . Sudden death Father   . Cancer Brother        colon  . Colon cancer Brother 4  . Stroke Maternal Grandmother   . Heart disease Maternal Grandfather   . Cancer Sister        brain  . Breast cancer Neg Hx   . Colon polyps Neg Hx   . Esophageal cancer Neg Hx   . Rectal cancer Neg Hx   . Stomach cancer Neg Hx   . Thyroid cancer Neg Hx     Social History   Tobacco Use  . Smoking status: Never Smoker  . Smokeless tobacco: Never Used  Vaping Use  . Vaping Use: Never used  Substance Use Topics  . Alcohol use: No  . Drug use: No    Current Outpatient Medications  Medication Sig Dispense Refill  . albuterol (PROVENTIL HFA;VENTOLIN HFA) 108 (90 Base) MCG/ACT inhaler Inhale 2 puffs into the lungs every 6 (six) hours as needed for wheezing or shortness of breath.    Marland Kitchen atorvastatin (LIPITOR) 20 MG tablet TAKE 1 TABLET (20 MG TOTAL) BY MOUTH DAILY. (Patient taking differently: Take 20 mg by mouth at bedtime. ) 90 tablet 1  . b complex vitamins tablet Take 1 tablet by mouth daily.    . cholecalciferol (VITAMIN D3) 25 MCG (1000 UT) tablet Take 2,000 Units by mouth daily.     Marland Kitchen docusate sodium (COLACE) 100 MG capsule Take 100 mg by mouth at bedtime.     . ferrous sulfate 325 (65 FE) MG EC tablet Take 1 tablet (325 mg total) by mouth 2 (two) times daily with a meal. (Patient taking differently: Take 325 mg by mouth at bedtime. ) 60 tablet 2  . glucose blood (FREESTYLE LITE) test strip USE AS INSTRUCTED TO CHECK SUGAR UP TO 3 TIMES DAILY 100 strip 12  . insulin degludec (TRESIBA FLEXTOUCH) 100 UNIT/ML FlexTouch Pen Inject 0.2 mLs (20 Units total) into the skin  at bedtime. (Patient taking differently: Inject 6 Units into the skin at bedtime. ) 15 mL 2  . JARDIANCE 25 MG TABS tablet TAKE 1 TABLET BY MOUTH ONCE DAILY (Patient taking differently: Take 25 mg by mouth daily. ) 90 tablet 1  . liraglutide (VICTOZA) 18 MG/3ML SOPN Inject 1.8 mg into the skin at bedtime. 9 mL 3  . loratadine (CLARITIN) 10 MG tablet Take 10 mg by mouth at bedtime.     . metFORMIN (GLUCOPHAGE) 1000 MG tablet TAKE 1 TABLET  BY MOUTH 2 TIMES DAILY WITH A MEAL. 60 tablet 3  . montelukast (SINGULAIR) 10 MG tablet TAKE 1 TABLET BY MOUTH AT BEDTIME (Patient taking differently: Take 10 mg by mouth at bedtime. ) 90 tablet 1  . Multiple Vitamin (MULTIVITAMIN WITH MINERALS) TABS tablet Take 1 tablet by mouth daily.    Marland Kitchen omeprazole (PRILOSEC) 20 MG capsule TAKE 1 CAPSULE BY MOUTH DAILY (Patient taking differently: Take 20 mg by mouth daily before breakfast. ) 90 capsule 1  . telmisartan-hydrochlorothiazide (MICARDIS HCT) 80-25 MG tablet Take 1 tablet by mouth daily. 30 tablet 0  . TRELEGY ELLIPTA 200-62.5-25 MCG/INH AEPB INHALE 1 PUFF INTO THE LUNGS DAILY. 60 each 6  . UNIFINE PENTIPS 32G X 4 MM MISC USE TWICE A DAY AS DIRECTED WITH DIABETES MEDICATION PENS 200 each 5  . venlafaxine XR (EFFEXOR-XR) 150 MG 24 hr capsule TAKE 1 CAPSULE BY MOUTH DAILY WITH BREAKFAST. 90 capsule 1  . Verapamil HCl CR 300 MG CP24 TAKE 1 CAPSULE BY MOUTH DAILY (Patient taking differently: Take 300 mg by mouth at bedtime. ) 90 capsule 1  . vitamin B-12 (CYANOCOBALAMIN) 1000 MCG tablet Take 1,000 mcg by mouth at bedtime.    . Vitamin D, Ergocalciferol, (DRISDOL) 1.25 MG (50000 UNIT) CAPS capsule TAKE 1 CAPSULE BY MOUTH EVERY 7 DAYS. 4 capsule 0   No current facility-administered medications for this visit.    Allergies  Allergen Reactions  . Sulfa Antibiotics Hives, Shortness Of Breath and Rash  . Lisinopril Cough  . Ramipril Cough    Review of Systems:  neg    Physical Exam:    BP 126/70   Pulse 69    Ht 5\' 3"  (1.6 m)   Wt 188 lb 2 oz (85.3 kg)   SpO2 96%   BMI 33.32 kg/m  Wt Readings from Last 3 Encounters:  12/06/20 188 lb 2 oz (85.3 kg)  11/29/20 184 lb (83.5 kg)  11/09/20 183 lb (83 kg)   Constitutional:  Well-developed, in no acute distress. Psychiatric: Normal mood and affect. Behavior is normal. HEENT: Pupils normal.  Conjunctivae are normal. No scleral icterus. Neck supple.  Cardiovascular: Normal rate, regular rhythm. No edema Pulmonary/chest: Effort normal and breath sounds normal. No wheezing, rales or rhonchi. Abdominal: Soft, nondistended. Nontender. Bowel sounds active throughout. There are no masses palpable. No hepatomegaly. Rectal: Deferred Neurological: Alert and oriented to person place and time. Skin: Skin is warm and dry. No rashes noted.  Data Reviewed: I have personally reviewed following labs and imaging studies  CBC: CBC Latest Ref Rng & Units 09/12/2020 07/12/2020 05/01/2020  WBC 4.0 - 10.5 K/uL 8.1 6.1 6.9  Hemoglobin 12.0 - 15.0 g/dL 13.0 11.3 9.6(L)  Hematocrit 36 - 46 % 43.1 36.8 33.7(L)  Platelets 150 - 400 K/uL 452(H) 422 250    CMP: CMP Latest Ref Rng & Units 09/12/2020 07/12/2020 06/21/2020  Glucose 70 - 99 mg/dL 123(H) 107(H) -  BUN 8 - 23 mg/dL 12 16 16   Creatinine 0.44 - 1.00 mg/dL 0.53 0.59 0.62  Sodium 135 - 145 mmol/L 138 139 -  Potassium 3.5 - 5.1 mmol/L 3.7 4.4 -  Chloride 98 - 111 mmol/L 99 101 -  CO2 22 - 32 mmol/L 26 25 -  Calcium 8.9 - 10.3 mg/dL 9.5 9.3 -  Total Protein 6.5 - 8.1 g/dL 8.3(H) 7.3 -  Total Bilirubin 0.3 - 1.2 mg/dL 1.0 0.5 -  Alkaline Phos 38 - 126 U/L 38 48 -  AST 15 -  41 U/L 26 16 -  ALT 0 - 44 U/L 21 12 -       Carmell Austria, MD 12/06/2020, 9:34 AM  Cc: Burnard Hawthorne, FNP

## 2020-12-06 NOTE — Patient Instructions (Signed)
If you are age 71 or older, your body mass index should be between 23-30. Your Body mass index is 33.32 kg/m. If this is out of the aforementioned range listed, please consider follow up with your Primary Care Provider.  If you are age 44 or younger, your body mass index should be between 19-25. Your Body mass index is 33.32 kg/m. If this is out of the aformentioned range listed, please consider follow up with your Primary Care Provider.   Follow up as needed.   Thank you,  Dr. Jackquline Denmark

## 2020-12-06 NOTE — Telephone Encounter (Signed)
This patient was last seen by Dr. Owens Shark, and currently has an upcoming appt scheduled on 12/20/20 with her.

## 2020-12-20 ENCOUNTER — Ambulatory Visit (INDEPENDENT_AMBULATORY_CARE_PROVIDER_SITE_OTHER): Payer: Medicare Other | Admitting: Bariatrics

## 2020-12-27 ENCOUNTER — Other Ambulatory Visit: Payer: Self-pay

## 2020-12-27 ENCOUNTER — Ambulatory Visit (INDEPENDENT_AMBULATORY_CARE_PROVIDER_SITE_OTHER): Payer: No Typology Code available for payment source | Admitting: Family

## 2020-12-27 ENCOUNTER — Ambulatory Visit: Payer: No Typology Code available for payment source | Admitting: Family

## 2020-12-27 ENCOUNTER — Encounter: Payer: Self-pay | Admitting: Family

## 2020-12-27 VITALS — BP 116/62 | HR 67 | Temp 98.3°F | Ht 63.0 in | Wt 196.0 lb

## 2020-12-27 DIAGNOSIS — D509 Iron deficiency anemia, unspecified: Secondary | ICD-10-CM

## 2020-12-27 DIAGNOSIS — E1169 Type 2 diabetes mellitus with other specified complication: Secondary | ICD-10-CM | POA: Diagnosis not present

## 2020-12-27 DIAGNOSIS — Z780 Asymptomatic menopausal state: Secondary | ICD-10-CM

## 2020-12-27 DIAGNOSIS — Z794 Long term (current) use of insulin: Secondary | ICD-10-CM

## 2020-12-27 DIAGNOSIS — E785 Hyperlipidemia, unspecified: Secondary | ICD-10-CM

## 2020-12-27 DIAGNOSIS — I10 Essential (primary) hypertension: Secondary | ICD-10-CM | POA: Diagnosis not present

## 2020-12-27 LAB — CBC WITH DIFFERENTIAL/PLATELET
Basophils Absolute: 0 10*3/uL (ref 0.0–0.1)
Basophils Relative: 0.7 % (ref 0.0–3.0)
Eosinophils Absolute: 0.4 10*3/uL (ref 0.0–0.7)
Eosinophils Relative: 7.2 % — ABNORMAL HIGH (ref 0.0–5.0)
HCT: 41.1 % (ref 36.0–46.0)
Hemoglobin: 13.6 g/dL (ref 12.0–15.0)
Lymphocytes Relative: 39.1 % (ref 12.0–46.0)
Lymphs Abs: 2.2 10*3/uL (ref 0.7–4.0)
MCHC: 33.1 g/dL (ref 30.0–36.0)
MCV: 90.5 fl (ref 78.0–100.0)
Monocytes Absolute: 0.5 10*3/uL (ref 0.1–1.0)
Monocytes Relative: 8.6 % (ref 3.0–12.0)
Neutro Abs: 2.5 10*3/uL (ref 1.4–7.7)
Neutrophils Relative %: 44.4 % (ref 43.0–77.0)
Platelets: 251 10*3/uL (ref 150.0–400.0)
RBC: 4.54 Mil/uL (ref 3.87–5.11)
RDW: 17.5 % — ABNORMAL HIGH (ref 11.5–15.5)
WBC: 5.7 10*3/uL (ref 4.0–10.5)

## 2020-12-27 LAB — IBC + FERRITIN
Ferritin: 10.9 ng/mL (ref 10.0–291.0)
Iron: 74 ug/dL (ref 42–145)
Saturation Ratios: 18.4 % — ABNORMAL LOW (ref 20.0–50.0)
Transferrin: 288 mg/dL (ref 212.0–360.0)

## 2020-12-27 LAB — COMPREHENSIVE METABOLIC PANEL
ALT: 16 U/L (ref 0–35)
AST: 20 U/L (ref 0–37)
Albumin: 4.3 g/dL (ref 3.5–5.2)
Alkaline Phosphatase: 41 U/L (ref 39–117)
BUN: 16 mg/dL (ref 6–23)
CO2: 32 mEq/L (ref 19–32)
Calcium: 9.4 mg/dL (ref 8.4–10.5)
Chloride: 100 mEq/L (ref 96–112)
Creatinine, Ser: 0.54 mg/dL (ref 0.40–1.20)
GFR: 92.64 mL/min (ref >60.00)
Glucose, Bld: 100 mg/dL — ABNORMAL HIGH (ref 70–99)
Potassium: 4.4 mEq/L (ref 3.5–5.1)
Sodium: 139 mEq/L (ref 135–145)
Total Bilirubin: 1 mg/dL (ref 0.2–1.2)
Total Protein: 7 g/dL (ref 6.0–8.3)

## 2020-12-27 LAB — HEMOGLOBIN A1C: Hgb A1c MFr Bld: 6.9 % — ABNORMAL HIGH (ref 4.6–6.5)

## 2020-12-27 MED ORDER — TELMISARTAN-HCTZ 80-25 MG PO TABS: 1.0000 | ORAL_TABLET | Freq: Every day | ORAL | 1 refills | Status: DC

## 2020-12-27 NOTE — Assessment & Plan Note (Signed)
Anticipate resolved. Pending labs. Advised if ferritin stores normal, she may stop ferrous sulfate and start using daily multivitamin.

## 2020-12-27 NOTE — Assessment & Plan Note (Signed)
Uncontrolled based on readings at home. Discontinue losartan. Start telmisartan-hczt 80-25, continue verapamil 300mg . Labs today and again in one week to monitor electrolytes, renal function.

## 2020-12-27 NOTE — Assessment & Plan Note (Signed)
Controlled. Continue lipitor 20mg 

## 2020-12-27 NOTE — Assessment & Plan Note (Signed)
Excellent control.Discontinue tresiba 6 units. Continue jardiance 25mg , metformin 1000mg  BID, victoza 1.8mg .

## 2020-12-27 NOTE — Patient Instructions (Addendum)
Stop [place].  Stop losartan I have refilled Micardis for you to take.  Lab in one week Please call  and schedule your  bone density scan as discussed.   [facility]  [address]  [place], [place]  [phone]

## 2020-12-27 NOTE — Progress Notes (Signed)
Subjective:    Patient ID: Vanessa Higgins, female    DOB: 06/13/1951, 69 y.o.   MRN: [id number]  CC: Vanessa Higgins is a 69 y.o. female who presents today for follow up.   HPI: HTN- She was recently changed to telmisartan- hctz by Dr Owens Shark however she has run out of medication. She then started losartan from prior prescription however unsure of dose. Compliant with verapamil 300mg .  No cp, sob, leg swelling, dizziness. No salt indiscretion, decongestants, NSAIDs.   At home BP, 150-160/90.   HLD-compliant with lipitor 20mg .   DM- compliant with tresiba 6 units, jardiance 25mg , metformin 1000mg  BID, victoza 1.8mg . Lost 21lbs with [facility]. No hypoglyemic episodes. FGB 130.   IDA resolved. Continues to take ferrous sulfate 325mg  QD with colace prn.  UTD colonoscopy and EGD with Dr Lyndel Safe whom advised continued omeprazole 20mg  QD.     HISTORY:  Past Medical History:  Diagnosis Date  . Allergy    hay fever  . Anemia   . Ankle fracture, left 04/08/2014  . Anxiety and depression   . Arthritis   . Asthma    well controlled   . Blood transfusion abn reaction or complication, no procedure mishap   . Blood transfusion without reported diagnosis   . COVID-19 virus infection 08/2020  . Depression   . Diabetes mellitus   . Disc displacement, lumbar   . Dysrhythmia    pvc's  . Fatty liver   . GERD (gastroesophageal reflux disease)   . Headache(784.0)   . History of hiatal hernia    small  . Hyperlipidemia   . Hypertension   . Joint pain   . Low vitamin B12 level   . Low vitamin D level   . Migraine   . Mitral valve prolapse   . Obstructive sleep apnea    CPAP  . Osteoarthritis   . Palpitations   . PONV (postoperative nausea and vomiting)    h/o in the past  . Sleep apnea 04/06/2017   uses CPAP  . SOB (shortness of breath)   . UTI (lower urinary tract infection)   . Vitamin B 12 deficiency   . Vitamin D deficiency   . Wears glasses    Past Surgical  History:  Procedure Laterality Date  . APPENDECTOMY  1977  . COLONOSCOPY  last 11/17/2014  . INGUINAL HERNIA REPAIR Right 11/01/2020   Procedure: HERNIA REPAIR INGUINAL ADULT;  Surgeon: Robert Bellow, MD;  Location: [facility];  Service: General;  Laterality: Right;  . JOINT REPLACEMENT    . KNEE ARTHROPLASTY Right 03/03/2019   Procedure: COMPUTER ASSISTED TOTAL KNEE ARTHROPLASTY-RIGHT;  Surgeon: Dereck Leep, MD;  Location: [facility];  Service: Orthopedics;  Laterality: Right;  . LUMBAR LAMINECTOMY/DECOMPRESSION MICRODISCECTOMY Right 02/06/2018   Procedure: Lumbar two Hemilaminectomy for Discectomy;  Surgeon: Ashok Pall, MD;  Location: [place];  Service: Neurosurgery;  Laterality: Right;  . ORIF ANKLE FRACTURE Left 04/08/2014   Procedure: OPEN REDUCTION INTERNAL FIXATION (ORIF) ANKLE FRACTURE LEFT ANKLE FRACTURE OPEN TREATMENT BIMALLEOLAR ANKLE INCLUDES INTERNAL FIXATION;  Surgeon: Johnny Bridge, MD;  Location: [place];  Service: Orthopedics;  Laterality: Left;  . PAROTIDECTOMY  1980   left  . POLYPECTOMY    . TONSILLECTOMY AND ADENOIDECTOMY     as a child  . TOTAL KNEE ARTHROPLASTY Bilateral    left  . TUBAL LIGATION  05/1976   Family History  Problem Relation Age of Onset  .  Sudden death Mother   . Cancer Mother 91       pancreatic  . Heart disease Father 6  . Sudden death Father   . Cancer Brother        colon  . Colon cancer Brother 57  . Stroke Maternal Grandmother   . Heart disease Maternal Grandfather   . Cancer Sister        brain  . Breast cancer Neg Hx   . Colon polyps Neg Hx   . Esophageal cancer Neg Hx   . Rectal cancer Neg Hx   . Stomach cancer Neg Hx   . Thyroid cancer Neg Hx     Allergies: Sulfa antibiotics, Lisinopril, and Ramipril Current Outpatient Medications on File Prior to Visit  Medication Sig Dispense Refill  . albuterol (PROVENTIL HFA;VENTOLIN HFA) 108 (90 Base) MCG/ACT inhaler Inhale 2 puffs into the lungs every 6 (six)  hours as needed for wheezing or shortness of breath.    Marland Kitchen atorvastatin (LIPITOR) 20 MG tablet TAKE 1 TABLET (20 MG TOTAL) BY MOUTH DAILY. (Patient taking differently: Take 20 mg by mouth at bedtime.) 90 tablet 1  . b complex vitamins tablet Take 1 tablet by mouth daily.    . cholecalciferol (VITAMIN D3) 25 MCG (1000 UT) tablet Take 2,000 Units by mouth daily.     Marland Kitchen docusate sodium (COLACE) 100 MG capsule Take 100 mg by mouth at bedtime.     . ferrous sulfate 325 (65 FE) MG EC tablet Take 1 tablet (325 mg total) by mouth 2 (two) times daily with a meal. (Patient taking differently: Take 325 mg by mouth at bedtime.) 60 tablet 2  . glucose blood (FREESTYLE LITE) test strip USE AS INSTRUCTED TO CHECK SUGAR UP TO 3 TIMES DAILY 100 strip 12  . JARDIANCE 25 MG TABS tablet TAKE 1 TABLET BY MOUTH ONCE DAILY (Patient taking differently: Take 25 mg by mouth daily.) 90 tablet 1  . liraglutide (VICTOZA) 18 MG/3ML SOPN Inject 1.8 mg into the skin at bedtime. 9 mL 3  . loratadine (CLARITIN) 10 MG tablet Take 10 mg by mouth at bedtime.     . metFORMIN (GLUCOPHAGE) 1000 MG tablet TAKE 1 TABLET BY MOUTH 2 TIMES DAILY WITH A MEAL. 60 tablet 3  . montelukast (SINGULAIR) 10 MG tablet TAKE 1 TABLET BY MOUTH AT BEDTIME (Patient taking differently: Take 10 mg by mouth at bedtime.) 90 tablet 1  . Multiple Vitamin (MULTIVITAMIN WITH MINERALS) TABS tablet Take 1 tablet by mouth daily.    Marland Kitchen omeprazole (PRILOSEC) 20 MG capsule TAKE 1 CAPSULE BY MOUTH DAILY (Patient taking differently: Take 20 mg by mouth daily before breakfast.) 90 capsule 1  . TRELEGY ELLIPTA 200-62.5-25 MCG/INH AEPB INHALE 1 PUFF INTO THE LUNGS DAILY. 60 each 6  . UNIFINE PENTIPS 32G X 4 MM MISC USE TWICE A DAY AS DIRECTED WITH DIABETES MEDICATION PENS 200 each 5  . venlafaxine XR (EFFEXOR-XR) 150 MG 24 hr capsule TAKE 1 CAPSULE BY MOUTH DAILY WITH BREAKFAST. 90 capsule 1  . Verapamil HCl CR 300 MG CP24 TAKE 1 CAPSULE BY MOUTH DAILY 90 capsule 1  . vitamin  B-12 (CYANOCOBALAMIN) 1000 MCG tablet Take 1,000 mcg by mouth at bedtime.    . Vitamin D, Ergocalciferol, (DRISDOL) 1.25 MG (50000 UNIT) CAPS capsule TAKE 1 CAPSULE BY MOUTH EVERY 7 DAYS. 4 capsule 0   No current facility-administered medications on file prior to visit.    Social History   Tobacco Use  .  Smoking status: Never Smoker  . Smokeless tobacco: Never Used  Vaping Use  . Vaping Use: Never used  Substance Use Topics  . Alcohol use: No  . Drug use: No    Review of Systems  Constitutional: Negative for chills and fever.  Respiratory: Negative for cough and shortness of breath.   Cardiovascular: Negative for chest pain and palpitations.  Gastrointestinal: Negative for nausea and vomiting.      Objective:    BP 116/62   Pulse 67   Temp 98.3 F (36.8 C)   Ht 5\' 3"  (1.6 m)   Wt 196 lb (88.9 kg)   SpO2 95%   BMI 34.72 kg/m  BP Readings from Last 3 Encounters:  12/27/20 116/62  12/06/20 126/70  11/29/20 126/77   Wt Readings from Last 3 Encounters:  12/27/20 196 lb (88.9 kg)  12/06/20 188 lb 2 oz (85.3 kg)  11/29/20 184 lb (83.5 kg)    Physical Exam Vitals reviewed.  Constitutional:      Appearance: She is well-developed and well-nourished.  Eyes:     Conjunctiva/sclera: Conjunctivae normal.  Cardiovascular:     Rate and Rhythm: Normal rate and regular rhythm.     Pulses: Normal pulses.     Heart sounds: Normal heart sounds.  Pulmonary:     Effort: Pulmonary effort is normal.     Breath sounds: Normal breath sounds. No wheezing, rhonchi or rales.  Skin:    General: Skin is warm and dry.  Neurological:     Mental Status: She is alert.  Psychiatric:        Mood and Affect: Mood and affect normal.        Speech: Speech normal.        Behavior: Behavior normal.        Thought Content: Thought content normal.        Assessment & Plan:   Problem List Items Addressed This Visit      Cardiovascular and Mediastinum   Essential hypertension     Uncontrolled based on readings at home. Discontinue losartan. Start telmisartan-hczt 80-25, continue verapamil 300mg . Labs today and again in one week to monitor electrolytes, renal function.       Relevant Medications   telmisartan-hydrochlorothiazide (MICARDIS HCT) 80-25 MG tablet   Other Relevant Orders   Comprehensive metabolic panel   Hemoglobin A1c   Basic metabolic panel     Endocrine   Diabetes mellitus (HCC)    Excellent control.Discontinue tresiba 6 units. Continue jardiance 25mg , metformin 1000mg  BID, victoza 1.8mg .      Relevant Medications   telmisartan-hydrochlorothiazide (MICARDIS HCT) 80-25 MG tablet     Other   Hyperlipidemia LDL goal <70    Controlled. Continue lipitor 20mg       Relevant Medications   telmisartan-hydrochlorothiazide (MICARDIS HCT) 80-25 MG tablet   IDA (iron deficiency anemia)    Anticipate resolved. Pending labs. Advised if ferritin stores normal, she may stop ferrous sulfate and start using daily multivitamin.       Relevant Orders   CBC with Differential/Platelet   IBC + Ferritin    Other Visit Diagnoses    Asymptomatic menopausal state    -  Primary   Relevant Orders   DG Bone Density       I have discontinued Elloise S. Pollard's 14/01/21 FlexTouch. I am also having her maintain her b complex vitamins, loratadine, albuterol, cholecalciferol, Jardiance, ferrous sulfate, FREESTYLE LITE, montelukast, Verapamil HCl CR, docusate sodium, Unifine Pentips, atorvastatin, Victoza, omeprazole, multivitamin  with minerals, vitamin B-12, Vitamin D (Ergocalciferol), Trelegy Ellipta, venlafaxine XR, metFORMIN, and telmisartan-hydrochlorothiazide.   Meds ordered this encounter  Medications  . telmisartan-hydrochlorothiazide (MICARDIS HCT) 80-25 MG tablet    Sig: Take 1 tablet by mouth daily.    Dispense:  90 tablet    Refill:  1    Order Specific Question:   Supervising Provider    Answer:   Crecencio Mc [[id number]]    Return precautions given.    Risks, benefits, and alternatives of the medications and treatment plan prescribed today were discussed, and patient expressed understanding.   Education regarding symptom management and diagnosis given to patient on AVS.  Continue to follow with Burnard Hawthorne, FNP for routine health maintenance.   [name] and I agreed with plan.   Mable Paris, FNP

## 2020-12-30 ENCOUNTER — Other Ambulatory Visit: Payer: Self-pay | Admitting: Family

## 2020-12-30 DIAGNOSIS — D509 Iron deficiency anemia, unspecified: Secondary | ICD-10-CM

## 2021-01-09 ENCOUNTER — Other Ambulatory Visit (INDEPENDENT_AMBULATORY_CARE_PROVIDER_SITE_OTHER): Payer: PPO

## 2021-01-09 ENCOUNTER — Other Ambulatory Visit: Payer: Self-pay

## 2021-01-09 DIAGNOSIS — I10 Essential (primary) hypertension: Secondary | ICD-10-CM | POA: Diagnosis not present

## 2021-01-10 ENCOUNTER — Encounter: Payer: Self-pay | Admitting: Family

## 2021-01-10 LAB — BASIC METABOLIC PANEL WITH GFR
BUN: 16 mg/dL (ref 6–23)
CO2: 33 meq/L — ABNORMAL HIGH (ref 19–32)
Calcium: 11.2 mg/dL — ABNORMAL HIGH (ref 8.4–10.5)
Chloride: 98 meq/L (ref 96–112)
Creatinine, Ser: 0.68 mg/dL (ref 0.40–1.20)
GFR: 87.61 mL/min
Glucose, Bld: 144 mg/dL — ABNORMAL HIGH (ref 70–99)
Potassium: 4.5 meq/L (ref 3.5–5.1)
Sodium: 140 meq/L (ref 135–145)

## 2021-01-12 ENCOUNTER — Other Ambulatory Visit: Payer: Self-pay | Admitting: Family

## 2021-01-12 DIAGNOSIS — R899 Unspecified abnormal finding in specimens from other organs, systems and tissues: Secondary | ICD-10-CM

## 2021-01-16 ENCOUNTER — Other Ambulatory Visit: Payer: Self-pay | Admitting: Family

## 2021-01-16 DIAGNOSIS — E119 Type 2 diabetes mellitus without complications: Secondary | ICD-10-CM

## 2021-01-16 DIAGNOSIS — J45909 Unspecified asthma, uncomplicated: Secondary | ICD-10-CM

## 2021-01-17 ENCOUNTER — Other Ambulatory Visit: Payer: Self-pay | Admitting: Family

## 2021-01-25 DIAGNOSIS — L03031 Cellulitis of right toe: Secondary | ICD-10-CM | POA: Diagnosis not present

## 2021-01-25 DIAGNOSIS — E119 Type 2 diabetes mellitus without complications: Secondary | ICD-10-CM | POA: Diagnosis not present

## 2021-01-31 ENCOUNTER — Encounter: Payer: Self-pay | Admitting: Family

## 2021-01-31 DIAGNOSIS — Z9189 Other specified personal risk factors, not elsewhere classified: Secondary | ICD-10-CM

## 2021-01-31 DIAGNOSIS — E559 Vitamin D deficiency, unspecified: Secondary | ICD-10-CM

## 2021-02-02 ENCOUNTER — Other Ambulatory Visit: Payer: Self-pay | Admitting: Family

## 2021-02-08 DIAGNOSIS — E119 Type 2 diabetes mellitus without complications: Secondary | ICD-10-CM | POA: Diagnosis not present

## 2021-02-08 DIAGNOSIS — L03031 Cellulitis of right toe: Secondary | ICD-10-CM | POA: Diagnosis not present

## 2021-02-12 ENCOUNTER — Other Ambulatory Visit: Payer: Self-pay | Admitting: Family

## 2021-02-12 ENCOUNTER — Other Ambulatory Visit: Payer: PPO

## 2021-02-12 NOTE — Telephone Encounter (Signed)
I called and LVM for  the patient to schedule a lab appointment in 1-2 weeks to recheck her calcium and vitamin d level.  Cherene Dobbins,cma

## 2021-02-14 ENCOUNTER — Ambulatory Visit (INDEPENDENT_AMBULATORY_CARE_PROVIDER_SITE_OTHER): Payer: PPO | Admitting: Internal Medicine

## 2021-02-14 ENCOUNTER — Encounter: Payer: Self-pay | Admitting: Internal Medicine

## 2021-02-14 ENCOUNTER — Other Ambulatory Visit: Payer: Self-pay

## 2021-02-14 VITALS — BP 110/64 | HR 73 | Ht 63.0 in | Wt 199.2 lb

## 2021-02-14 DIAGNOSIS — R0602 Shortness of breath: Secondary | ICD-10-CM | POA: Diagnosis not present

## 2021-02-14 DIAGNOSIS — E785 Hyperlipidemia, unspecified: Secondary | ICD-10-CM

## 2021-02-14 DIAGNOSIS — I493 Ventricular premature depolarization: Secondary | ICD-10-CM

## 2021-02-14 DIAGNOSIS — E1169 Type 2 diabetes mellitus with other specified complication: Secondary | ICD-10-CM

## 2021-02-14 DIAGNOSIS — I10 Essential (primary) hypertension: Secondary | ICD-10-CM

## 2021-02-14 NOTE — Patient Instructions (Signed)
Medication Instructions:  Your physician recommends that you continue on your current medications as directed. Please refer to the Current Medication list given to you today.  *If you need a refill on your cardiac medications before your next appointment, please call your pharmacy*  Follow-Up: At [facility], you and your health needs are our priority.  As part of our continuing mission to provide you with exceptional heart care, we have created designated Provider Care Teams.  These Care Teams include your primary Cardiologist (physician) and Advanced Practice Providers (APPs -  Physician Assistants and Nurse Practitioners) who all work together to provide you with the care you need, when you need it.  We recommend signing up for the patient portal called "MyChart".  Sign up information is provided on this After Visit Summary.  MyChart is used to connect with patients for Virtual Visits (Telemedicine).  Patients are able to view lab/test results, encounter notes, upcoming appointments, etc.  Non-urgent messages can be sent to your provider as well.   To learn more about what you can do with MyChart, go to [email or web address].    Your next appointment:   12 month(s)  The format for your next appointment:   In Person  Provider:   You may see Christopher End, MD or one of the following Advanced Practice Providers on your designated Care Team:    Christopher Berge, NP  Ryan Dunn, PA-C  Jacquelyn Visser, PA-C  Cadence Furth, PA-C  Caitlin Walker, NP  

## 2021-02-14 NOTE — Progress Notes (Signed)
Follow-up Outpatient Visit Date: 02/14/2021  Primary Care Provider: Burnard Hawthorne, FNP [address] [place] [address]  Chief Complaint: Follow-up PVCs and shortness of breath  HPI:  Vanessa Higgins is a 70 y.o. female with history of frequent PVCs, hypertension, hyperlipidemia, type 2 diabetes mellitus, asthma, obstructive sleep apnea, and GERD, who presents for follow-up of palpitations and PVCs.  She was last seen in our office in 07/2020 by Marrianne Mood, PA, at which time she reported improved shortness of breath since starting a new inhaler.  Today, Vanessa Higgins reports that she has continued to do well with no significant shortness of breath on her current inhaler regimen.  She also denies chest pain, lightheadedness, and edema.  She reports rare "jumps" in her heartbeat without associated symptoms.  She is using CPAP nightly and feels like it has helped her rest.  Blood pressure was elevated in the fall after she contracted COVID-19, though this has normalized with adjustments in her blood pressure regimen by her PCP.  --------------------------------------------------------------------------------------------------  Cardiovascular History & Procedures: Cardiovascular Problems:  Frequent PVCs  Shortness of breath  Risk Factors:  Hypertension, hyperlipidemia, diabetes mellitus, obesity, and age greater than 72  Cath/PCI:  None  CV Surgery:  None  EP Procedures and Devices:  Holter monitor (09/19/16): Predominantly sinus rhythm with rare PACs and frequent PVCs (11% burden). Single 5 beat atrial run.  Non-Invasive Evaluation(s):  TTE (06/15/20): Normal LV size and wall thickness.  LVEF 60-65% with grade 1 diastolic dysfunction.  Normal RV size and function.  Mild mitral regurgitation.  TTE (09/19/16): Normal LV size with an EF of 60-65%. Normal wall motion and diastolic function. Normal RV size and function. No significant valvular  abnormalities.  Pharmacologic MPI (09/06/16): Low risk study without ischemia. LVEF 55%.   Recent CV Pertinent Labs: Lab Results  Component Value Date   CHOL 105 07/12/2020   CHOL 102 06/28/2013   HDL 41 07/12/2020   HDL 44 06/28/2013   LDLCALC 49 07/12/2020   LDLCALC 42 06/28/2013   TRIG 70 07/12/2020   TRIG 81 06/28/2013   CHOLHDL 5 03/22/2020   INR 1.07 02/18/2019   K 4.5 01/09/2021   K 4.2 12/28/2012   BUN 16 01/09/2021   BUN 16 07/12/2020   BUN 16 12/28/2012   CREATININE 0.68 01/09/2021   CREATININE 0.57 12/02/2014    Past medical and surgical history were reviewed and updated in EPIC.  Current Meds  Medication Sig  . albuterol (PROVENTIL HFA;VENTOLIN HFA) 108 (90 Base) MCG/ACT inhaler Inhale 2 puffs into the lungs every 6 (six) hours as needed for wheezing or shortness of breath.  Marland Kitchen atorvastatin (LIPITOR) 20 MG tablet TAKE 1 TABLET (20 MG TOTAL) BY MOUTH DAILY. (Patient taking differently: Take 20 mg by mouth at bedtime.)  . b complex vitamins tablet Take 1 tablet by mouth daily.  . cholecalciferol (VITAMIN D3) 25 MCG (1000 UNIT) tablet Take 1,000 Units by mouth daily.  Marland Kitchen docusate sodium (COLACE) 100 MG capsule Take 100 mg by mouth at bedtime.   . ferrous sulfate 325 (65 FE) MG EC tablet Take 1 tablet (325 mg total) by mouth 2 (two) times daily with a meal. (Patient taking differently: Take 325 mg by mouth at bedtime.)  . glucose blood (FREESTYLE LITE) test strip USE AS INSTRUCTED TO CHECK SUGAR UP TO 3 TIMES DAILY  . JARDIANCE 25 MG TABS tablet TAKE 1 TABLET BY MOUTH ONCE DAILY  . liraglutide (VICTOZA) 18 MG/3ML SOPN  Inject 1.8 mg into the skin at bedtime.  Marland Kitchen loratadine (CLARITIN) 10 MG tablet Take 10 mg by mouth at bedtime.   . metFORMIN (GLUCOPHAGE) 1000 MG tablet TAKE 1 TABLET BY MOUTH 2 TIMES DAILY WITH A MEAL.  . montelukast (SINGULAIR) 10 MG tablet TAKE 1 TABLET BY MOUTH AT BEDTIME  . Multiple Vitamin (MULTIVITAMIN WITH MINERALS) TABS tablet Take 1 tablet by  mouth daily.  Marland Kitchen omeprazole (PRILOSEC) 20 MG capsule TAKE 1 CAPSULE BY MOUTH DAILY (Patient taking differently: Take 20 mg by mouth daily before breakfast.)  . telmisartan-hydrochlorothiazide (MICARDIS HCT) 80-25 MG tablet Take 1 tablet by mouth daily.  . TRELEGY ELLIPTA 200-62.5-25 MCG/INH AEPB INHALE 1 PUFF INTO THE LUNGS DAILY.  Marland Kitchen UNIFINE PENTIPS 32G X 4 MM MISC USE TWICE A DAY AS DIRECTED WITH DIABETES MEDICATION PENS  . venlafaxine XR (EFFEXOR-XR) 150 MG 24 hr capsule TAKE 1 CAPSULE BY MOUTH DAILY WITH BREAKFAST.  Marland Kitchen Verapamil HCl CR 300 MG CP24 TAKE 1 CAPSULE BY MOUTH DAILY  . vitamin B-12 (CYANOCOBALAMIN) 1000 MCG tablet Take 1,000 mcg by mouth at bedtime.    Allergies: Sulfa antibiotics, Lisinopril, Other, and Ramipril  Social History   Tobacco Use  . Smoking status: Never Smoker  . Smokeless tobacco: Never Used  Vaping Use  . Vaping Use: Never used  Substance Use Topics  . Alcohol use: No  . Drug use: No    Family History  Problem Relation Age of Onset  . Sudden death Mother   . Cancer Mother 28       pancreatic  . Heart disease Father 21  . Sudden death Father   . Cancer Brother        colon  . Colon cancer Brother 7  . Stroke Maternal Grandmother   . Heart disease Maternal Grandfather   . Cancer Sister        brain  . Breast cancer Neg Hx   . Colon polyps Neg Hx   . Esophageal cancer Neg Hx   . Rectal cancer Neg Hx   . Stomach cancer Neg Hx   . Thyroid cancer Neg Hx     Review of Systems: A 12-system review of systems was performed and was negative except as noted in the HPI.  --------------------------------------------------------------------------------------------------  Physical Exam: BP 110/64 (BP Location: Left Arm, Patient Position: Sitting, Cuff Size: Normal)   Pulse 73   Ht 5\' 3"  (1.6 m)   Wt 199 lb 4 oz (90.4 kg)   SpO2 97%   BMI 35.30 kg/m   General:  NAD. Neck: No JVD or HJR. Lungs: Clear to auscultation bilaterally without wheezes  or crackles. Heart: Regular rate and rhythm without murmurs, rubs, or gallops. Abdomen: Soft, nontender, nondistended. Extremities: No lower extremity edema.  EKG: Normal sinus rhythm with poor R wave progression, most likely reflecting lead placement.  No significant change from prior tracing on 08/10/2020.  Lab Results  Component Value Date   WBC 5.7 12/27/2020   HGB 13.6 12/27/2020   HCT 41.1 12/27/2020   MCV 90.5 12/27/2020   PLT 251.0 12/27/2020    Lab Results  Component Value Date   NA 140 01/09/2021   K 4.5 01/09/2021   CL 98 01/09/2021   CO2 33 (H) 01/09/2021   BUN 16 01/09/2021   CREATININE 0.68 01/09/2021   GLUCOSE 144 (H) 01/09/2021   ALT 16 12/27/2020    Lab Results  Component Value Date   CHOL 105 07/12/2020   HDL 41 07/12/2020  LDLCALC 49 07/12/2020   TRIG 70 07/12/2020   CHOLHDL 5 03/22/2020    --------------------------------------------------------------------------------------------------  ASSESSMENT AND PLAN: Shortness of breath: Stable and well-controlled with current inhaler regimen.  Suspect symptoms are mostly due to underlying lung disease.  I encouraged continued follow-up with her pulmonologist.  No further cardiac testing or intervention recommended.  PVCs: Sporadic isolated palpitations without associated symptoms reported.  Continue current dose of verapamil.  Hyperlipidemia associated with type 2 diabetes mellitus: Lipids well controlled on last check in 06/2020.  Continue atorvastatin.  Hypertension: Blood pressure well controlled today.  No medication changes at this time.  Follow-up: Return to clinic in 1 year.  [name], MD 02/14/2021 4:43 PM

## 2021-02-16 ENCOUNTER — Encounter: Payer: Self-pay | Admitting: Internal Medicine

## 2021-02-27 ENCOUNTER — Other Ambulatory Visit: Payer: Self-pay | Admitting: Family

## 2021-02-27 DIAGNOSIS — E119 Type 2 diabetes mellitus without complications: Secondary | ICD-10-CM

## 2021-04-03 ENCOUNTER — Other Ambulatory Visit: Payer: Self-pay

## 2021-04-04 ENCOUNTER — Encounter: Payer: Self-pay | Admitting: Internal Medicine

## 2021-04-04 ENCOUNTER — Ambulatory Visit (INDEPENDENT_AMBULATORY_CARE_PROVIDER_SITE_OTHER): Payer: Medicare HMO | Admitting: Internal Medicine

## 2021-04-04 ENCOUNTER — Other Ambulatory Visit: Payer: Self-pay

## 2021-04-04 VITALS — BP 96/58 | HR 91 | Temp 97.6°F | Ht 63.0 in | Wt 199.0 lb

## 2021-04-04 DIAGNOSIS — N3000 Acute cystitis without hematuria: Secondary | ICD-10-CM | POA: Diagnosis not present

## 2021-04-04 MED ORDER — PHENAZOPYRIDINE HCL 200 MG PO TABS
200.0000 mg | ORAL_TABLET | Freq: Three times a day (TID) | ORAL | 0 refills | Status: DC | PRN
Start: 2021-04-04 — End: 2021-04-10

## 2021-04-04 MED ORDER — CIPROFLOXACIN HCL 500 MG PO TABS: 500.0000 mg | ORAL_TABLET | Freq: Two times a day (BID) | ORAL | 0 refills | Status: DC

## 2021-04-04 NOTE — Addendum Note (Signed)
Addended by: Ezequiel Ganser on: 04/04/2021 02:39 PM   Modules accepted: Orders

## 2021-04-04 NOTE — Progress Notes (Signed)
Chief Complaint  Patient presents with  . Urinary Tract Infection   F/u  UTI Monday with burning pressure increased frequency and reduced output she did feel lightheaded/dizzy with chills but resolved and BP low normal today  F/u prev. Urology in the past at times has bowel incontinence she takes iron and colace    Review of Systems  Constitutional: Negative for weight loss.  Respiratory: Negative for shortness of breath.   Cardiovascular: Negative for chest pain.  Genitourinary: Positive for dysuria and frequency.  Neurological: Negative for dizziness.   Past Medical History:  Diagnosis Date  . Allergy    hay fever  . Anemia   . Ankle fracture, left 04/08/2014  . Anxiety and depression   . Arthritis   . Asthma    well controlled   . Blood transfusion abn reaction or complication, no procedure mishap   . Blood transfusion without reported diagnosis   . COVID-19 virus infection 08/2020  . Depression   . Diabetes mellitus   . Disc displacement, lumbar   . Dysrhythmia    pvc's  . Fatty liver   . GERD (gastroesophageal reflux disease)   . Headache(784.0)   . History of hiatal hernia    small  . Hyperlipidemia   . Hypertension   . Joint pain   . Low vitamin B12 level   . Low vitamin D level   . Migraine   . Mitral valve prolapse   . Obstructive sleep apnea    CPAP  . Osteoarthritis   . Palpitations   . PONV (postoperative nausea and vomiting)    h/o in the past  . Sleep apnea 04/06/2017   uses CPAP  . SOB (shortness of breath)   . UTI (lower urinary tract infection)   . Vitamin B 12 deficiency   . Vitamin D deficiency   . Wears glasses    Past Surgical History:  Procedure Laterality Date  . APPENDECTOMY  1977  . COLONOSCOPY  last 11/17/2014  . INGUINAL HERNIA REPAIR Right 11/01/2020   Procedure: HERNIA REPAIR INGUINAL ADULT;  Surgeon: Robert Bellow, MD;  Location: [facility];  Service: General;  Laterality: Right;  . JOINT REPLACEMENT    . KNEE  ARTHROPLASTY Right 03/03/2019   Procedure: COMPUTER ASSISTED TOTAL KNEE ARTHROPLASTY-RIGHT;  Surgeon: Dereck Leep, MD;  Location: [facility];  Service: Orthopedics;  Laterality: Right;  . LUMBAR LAMINECTOMY/DECOMPRESSION MICRODISCECTOMY Right 02/06/2018   Procedure: Lumbar two Hemilaminectomy for Discectomy;  Surgeon: Ashok Pall, MD;  Location: [place];  Service: Neurosurgery;  Laterality: Right;  . ORIF ANKLE FRACTURE Left 04/08/2014   Procedure: OPEN REDUCTION INTERNAL FIXATION (ORIF) ANKLE FRACTURE LEFT ANKLE FRACTURE OPEN TREATMENT BIMALLEOLAR ANKLE INCLUDES INTERNAL FIXATION;  Surgeon: Johnny Bridge, MD;  Location: [place];  Service: Orthopedics;  Laterality: Left;  . PAROTIDECTOMY  1980   left  . POLYPECTOMY    . TONSILLECTOMY AND ADENOIDECTOMY     as a child  . TOTAL KNEE ARTHROPLASTY Bilateral    left  . TUBAL LIGATION  05/1976   Family History  Problem Relation Age of Onset  . Sudden death Mother   . Cancer Mother 58       pancreatic  . Heart disease Father 52  . Sudden death Father   . Cancer Brother        colon  . Colon cancer Brother 63  . Stroke Maternal Grandmother   . Heart disease Maternal Grandfather   . Cancer Sister  brain  . Breast cancer Neg Hx   . Colon polyps Neg Hx   . Esophageal cancer Neg Hx   . Rectal cancer Neg Hx   . Stomach cancer Neg Hx   . Thyroid cancer Neg Hx    Social History   Socioeconomic History  . Marital status: Widowed    Spouse name: Not on file  . Number of children: Not on file  . Years of education: Not on file  . Highest education level: Not on file  Occupational History  . 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 Smoking status: Never Smoker  . Smokeless tobacco: Never Used  Vaping Use  . Vaping Use: Never used  Substance and Sexual Activity  . Alcohol use: No  . Drug use: No  . Sexual activity: Not on file  Other Topics Concern  . Not on file  Social History Narrative    Lives in [place] with daughter and twin grandchildren 9YO. Husband deceased 04-15-08.      Work - [place] [employer]- [employer], [employer]   Diet - healthy   Exercise - gym 2 x per week   Social Determinants of Health   Financial Resource Strain: Not on file  Food Insecurity: Not on file  Transportation Needs: Not on file  Physical Activity: Not on file  Stress: Not on file  Social Connections: Not on file  Intimate Partner Violence: Not on file   Current Meds  Medication Sig  . albuterol (PROVENTIL HFA;VENTOLIN HFA) 108 (90 Base) MCG/ACT inhaler Inhale 2 puffs into the lungs every 6 (six) hours as needed for wheezing or shortness of breath.  Marland Kitchen atorvastatin (LIPITOR) 20 MG tablet TAKE 1 TABLET (20 MG TOTAL) BY MOUTH DAILY. (Patient taking differently: Take 20 mg by mouth at bedtime.)  . b complex vitamins tablet Take 1 tablet by mouth daily.  . cholecalciferol (VITAMIN D3) 25 MCG (1000 UNIT) tablet Take 1,000 Units by mouth daily.  . ciprofloxacin (CIPRO) 500 MG tablet Take 1 tablet (500 mg total) by mouth 2 (two) times daily. With food  . docusate sodium (COLACE) 100 MG capsule Take 100 mg by mouth at bedtime.   . empagliflozin (JARDIANCE) 25 MG TABS tablet TAKE 1 TABLET BY MOUTH ONCE DAILY  . ferrous sulfate 325 (65 FE) MG EC tablet Take 1 tablet (325 mg total) by mouth 2 (two) times daily with a meal. (Patient taking differently: Take 325 mg by mouth at bedtime.)  . Fluticasone-Umeclidin-Vilant 100-62.5-25 MCG/INH AEPB INHALE 1 PUFF INTO THE LUNGS DAILY  . glucose blood test strip USE AS INSTRUCTED TO CHECK SUGAR UP TO 3 TIMES DAILY (Patient taking differently: USE AS INSTRUCTED TO CHECK SUGAR UP TO 3 TIMES DAILY)  . Insulin Pen Needle 32G X 4 MM MISC USE TWICE A DAY AS DIRECTED WITH DIABETES MEDICATION PENS  . liraglutide (VICTOZA) 18 MG/3ML SOPN INJECT 1.8 MG INTO THE SKIN AT BEDTIME.  Marland Kitchen loratadine (CLARITIN) 10 MG tablet Take 10 mg by mouth at bedtime.   . metFORMIN (GLUCOPHAGE) 1000 MG  tablet TAKE 1 TABLET BY MOUTH 2 TIMES DAILY WITH A MEAL.  . montelukast (SINGULAIR) 10 MG tablet TAKE 1 TABLET BY MOUTH AT BEDTIME  . Multiple Vitamin (MULTIVITAMIN WITH MINERALS) TABS tablet Take 1 tablet by mouth daily.  Marland Kitchen omeprazole (PRILOSEC) 20 MG capsule TAKE 1 CAPSULE BY MOUTH DAILY (Patient taking differently: Take 20 mg by mouth daily before breakfast.)  . phenazopyridine (PYRIDIUM) 200 MG tablet Take 1  tablet (200 mg total) by mouth 3 (three) times daily as needed for pain.  Marland Kitchen telmisartan-hydrochlorothiazide (MICARDIS HCT) 80-25 MG tablet Take 1 tablet by mouth daily.  . TRELEGY ELLIPTA 200-62.5-25 MCG/INH AEPB INHALE 1 PUFF INTO THE LUNGS DAILY.  Marland Kitchen venlafaxine XR (EFFEXOR-XR) 150 MG 24 hr capsule TAKE 1 CAPSULE BY MOUTH DAILY WITH BREAKFAST  . Verapamil HCl CR 300 MG CP24 TAKE 1 CAPSULE BY MOUTH DAILY  . vitamin B-12 (CYANOCOBALAMIN) 1000 MCG tablet Take 1,000 mcg by mouth at bedtime.   Allergies  Allergen Reactions  . Sulfa Antibiotics Hives, Shortness Of Breath and Rash  . Lisinopril Cough  . Other Other (See Comments) and Nausea And Vomiting  . Ramipril Cough   Recent Results (from the past 2160 hour(s))  Basic metabolic panel     Status: Abnormal   Collection Time: 01/09/21  3:12 PM  Result Value Ref Range   Sodium 140 135 - 145 mEq/L   Potassium 4.5 3.5 - 5.1 mEq/L   Chloride 98 96 - 112 mEq/L   CO2 33 (H) 19 - 32 mEq/L   Glucose, Bld 144 (H) 70 - 99 mg/dL   BUN 16 6 - 23 mg/dL   Creatinine, Ser 0.68 0.40 - 1.20 mg/dL   GFR 87.61 >60.00 mL/min    Comment: Calculated using the CKD-EPI Creatinine Equation (2021)   Calcium 11.2 (H) 8.4 - 10.5 mg/dL   Objective  Body mass index is 35.25 kg/m. Wt Readings from Last 3 Encounters:  04/04/21 199 lb (90.3 kg)  02/14/21 199 lb 4 oz (90.4 kg)  12/27/20 196 lb (88.9 kg)   Temp Readings from Last 3 Encounters:  04/04/21 97.6 F (36.4 C) (Oral)  12/27/20 98.3 F (36.8 C)  11/29/20 98.1 F (36.7 C)   BP Readings from  Last 3 Encounters:  04/04/21 (!) 96/58  02/14/21 110/64  12/27/20 116/62   Pulse Readings from Last 3 Encounters:  04/04/21 91  02/14/21 73  12/27/20 67    Physical Exam Vitals and nursing note reviewed.  Constitutional:      Appearance: Normal appearance. She is well-developed and well-groomed.  HENT:     Head: Normocephalic and atraumatic.  Cardiovascular:     Rate and Rhythm: Normal rate and regular rhythm.     Heart sounds: Normal heart sounds. No murmur heard.   Pulmonary:     Effort: Pulmonary effort is normal.     Breath sounds: Normal breath sounds.  Abdominal:     General: Abdomen is flat. Bowel sounds are normal.  Skin:    General: Skin is warm and dry.  Neurological:     General: No focal deficit present.     Mental Status: She is alert and oriented to person, place, and time. Mental status is at baseline.     Gait: Gait normal.  Psychiatric:        Attention and Perception: Attention and perception normal.        Mood and Affect: Mood and affect normal.        Speech: Speech normal.        Behavior: Behavior normal. Behavior is cooperative.        Thought Content: Thought content normal.        Cognition and Memory: Cognition and memory normal.        Judgment: Judgment normal.     Assessment  Plan  Acute cystitis without hematuria - Plan: Urinalysis, Routine w reflex microscopic, Urine Culture, ciprofloxacin (CIPRO) 500 MG  tablet, phenazopyridine (PYRIDIUM) 200 MG tablet, Ambulatory referral to Urogynecology   F/u PCP next week hypercalcemia   Provider: Dr. Olivia Mackie McLean-Scocuzza-[facility]

## 2021-04-04 NOTE — Addendum Note (Signed)
Addended by: Ezequiel Ganser on: 04/04/2021 03:43 PM   Modules accepted: Orders

## 2021-04-04 NOTE — Patient Instructions (Addendum)
integra plus (with vitamin C)  polyiron  Ferrous gluconate   Cranberry supplements  D mannose can help prevent UTI these supplements   Dr. Simon Rhein urogynecology  [phone] Not available [place] [id number]       Urinary Tract Infection, Adult  A urinary tract infection (UTI) is an infection of any part of the urinary tract. The urinary tract includes the kidneys, ureters, bladder, and urethra. These organs make, store, and get rid of urine in the body. An upper UTI affects the ureters and kidneys. A lower UTI affects the bladder and urethra. What are the causes? Most urinary tract infections are caused by bacteria in your genital area around your urethra, where urine leaves your body. These bacteria grow and cause inflammation of your urinary tract. What increases the risk? You are more likely to develop this condition if:  You have a urinary catheter that stays in place.  You are not able to control when you urinate or have a bowel movement (incontinence).  You are female and you: ? Use a spermicide or diaphragm for birth control. ? Have low estrogen levels. ? Are pregnant.  You have certain genes that increase your risk.  You are sexually active.  You take antibiotic medicines.  You have a condition that causes your flow of urine to slow down, such as: ? An enlarged prostate, if you are female. ? Blockage in your urethra. ? A kidney stone. ? A nerve condition that affects your bladder control (neurogenic bladder). ? Not getting enough to drink, or not urinating often.  You have certain medical conditions, such as: ? Diabetes. ? A weak disease-fighting system (immunesystem). ? Sickle cell disease. ? Gout. ? Spinal cord injury. What are the signs or symptoms? Symptoms of this condition include:  Needing to urinate right away (urgency).  Frequent urination. This may include small amounts of urine each time you  urinate.  Pain or burning with urination.  Blood in the urine.  Urine that smells bad or unusual.  Trouble urinating.  Cloudy urine.  Vaginal discharge, if you are female.  Pain in the abdomen or the lower back. You may also have:  Vomiting or a decreased appetite.  Confusion.  Irritability or tiredness.  A fever or chills.  Diarrhea. The first symptom in older adults may be confusion. In some cases, they may not have any symptoms until the infection has worsened. How is this diagnosed? This condition is diagnosed based on your medical history and a physical exam. You may also have other tests, including:  Urine tests.  Blood tests.  Tests for STIs (sexually transmitted infections). If you have had more than one UTI, a cystoscopy or imaging studies may be done to determine the cause of the infections. How is this treated? Treatment for this condition includes:  Antibiotic medicine.  Over-the-counter medicines to treat discomfort.  Drinking enough water to stay hydrated. If you have frequent infections or have other conditions such as a kidney stone, you may need to see a health care provider who specializes in the urinary tract (urologist). In rare cases, urinary tract infections can cause sepsis. Sepsis is a life-threatening condition that occurs when the body responds to an infection. Sepsis is treated in the hospital with IV antibiotics, fluids, and other medicines. Follow these instructions at home: Medicines  Take over-the-counter and prescription medicines only as told by your health care provider.  If you were prescribed an antibiotic medicine, take  it as told by your health care provider. Do not stop using the antibiotic even if you start to feel better. General instructions  Make sure you: ? Empty your bladder often and completely. Do not hold urine for long periods of time. ? Empty your bladder after sex. ? Wipe from front to back after urinating or  having a bowel movement if you are female. Use each tissue only one time when you wipe.  Drink enough fluid to keep your urine pale yellow.  Keep all follow-up visits. This is important.   Contact a health care provider if:  Your symptoms do not get better after 1-2 days.  Your symptoms go away and then return. Get help right away if:  You have severe pain in your back or your lower abdomen.  You have a fever or chills.  You have nausea or vomiting. Summary  A urinary tract infection (UTI) is an infection of any part of the urinary tract, which includes the kidneys, ureters, bladder, and urethra.  Most urinary tract infections are caused by bacteria in your genital area.  Treatment for this condition often includes antibiotic medicines.  If you were prescribed an antibiotic medicine, take it as told by your health care provider. Do not stop using the antibiotic even if you start to feel better.  Keep all follow-up visits. This is important. This information is not intended to replace advice given to you by your health care provider. Make sure you discuss any questions you have with your health care provider. Document Revised: 07/28/2020 Document Reviewed: 07/28/2020 Elsevier Patient Education  [place].

## 2021-04-07 LAB — URINE CULTURE

## 2021-04-10 ENCOUNTER — Other Ambulatory Visit (HOSPITAL_COMMUNITY): Payer: Self-pay

## 2021-04-10 ENCOUNTER — Other Ambulatory Visit: Payer: Self-pay

## 2021-04-10 ENCOUNTER — Encounter: Payer: Self-pay | Admitting: Family

## 2021-04-10 ENCOUNTER — Ambulatory Visit (INDEPENDENT_AMBULATORY_CARE_PROVIDER_SITE_OTHER): Payer: Medicare HMO | Admitting: Family

## 2021-04-10 VITALS — BP 104/62 | HR 73 | Temp 98.0°F | Ht 63.0 in | Wt 200.2 lb

## 2021-04-10 DIAGNOSIS — Z794 Long term (current) use of insulin: Secondary | ICD-10-CM | POA: Diagnosis not present

## 2021-04-10 DIAGNOSIS — I10 Essential (primary) hypertension: Secondary | ICD-10-CM

## 2021-04-10 DIAGNOSIS — J45909 Unspecified asthma, uncomplicated: Secondary | ICD-10-CM

## 2021-04-10 DIAGNOSIS — E785 Hyperlipidemia, unspecified: Secondary | ICD-10-CM

## 2021-04-10 DIAGNOSIS — E119 Type 2 diabetes mellitus without complications: Secondary | ICD-10-CM

## 2021-04-10 DIAGNOSIS — E1169 Type 2 diabetes mellitus with other specified complication: Secondary | ICD-10-CM | POA: Diagnosis not present

## 2021-04-10 DIAGNOSIS — D509 Iron deficiency anemia, unspecified: Secondary | ICD-10-CM

## 2021-04-10 LAB — URINALYSIS, ROUTINE W REFLEX MICROSCOPIC
Bilirubin Urine: NEGATIVE
Hgb urine dipstick: NEGATIVE
Ketones, ur: NEGATIVE
Leukocytes,Ua: NEGATIVE
Nitrite: NEGATIVE
Specific Gravity, Urine: 1.015 (ref 1.000–1.030)
Total Protein, Urine: NEGATIVE
Urine Glucose: >=1000 — AB
Urobilinogen, UA: 0.2 (ref 0.0–1.0)
pH: 5.5 (ref 5.0–8.0)

## 2021-04-10 LAB — COMPREHENSIVE METABOLIC PANEL
ALT: 32 U/L (ref 0–35)
AST: 30 U/L (ref 0–37)
Albumin: 4.2 g/dL (ref 3.5–5.2)
Alkaline Phosphatase: 45 U/L (ref 39–117)
BUN: 21 mg/dL (ref 6–23)
CO2: 30 mEq/L (ref 19–32)
Calcium: 10.1 mg/dL (ref 8.4–10.5)
Chloride: 95 mEq/L — ABNORMAL LOW (ref 96–112)
Creatinine, Ser: 0.8 mg/dL (ref 0.40–1.20)
GFR: 73.99 mL/min (ref >60.00)
Glucose, Bld: 163 mg/dL — ABNORMAL HIGH (ref 70–99)
Potassium: 4 mEq/L (ref 3.5–5.1)
Sodium: 140 mEq/L (ref 135–145)
Total Bilirubin: 0.8 mg/dL (ref 0.2–1.2)
Total Protein: 7.6 g/dL (ref 6.0–8.3)

## 2021-04-10 LAB — CBC WITH DIFFERENTIAL/PLATELET
Basophils Absolute: 0 10*3/uL (ref 0.0–0.1)
Basophils Relative: 0.7 % (ref 0.0–3.0)
Eosinophils Absolute: 0.4 10*3/uL (ref 0.0–0.7)
Eosinophils Relative: 6.5 % — ABNORMAL HIGH (ref 0.0–5.0)
HCT: 42 % (ref 36.0–46.0)
Hemoglobin: 14.1 g/dL (ref 12.0–15.0)
Lymphocytes Relative: 28.3 % (ref 12.0–46.0)
Lymphs Abs: 2 10*3/uL (ref 0.7–4.0)
MCHC: 33.4 g/dL (ref 30.0–36.0)
MCV: 94.2 fl (ref 78.0–100.0)
Monocytes Absolute: 0.6 10*3/uL (ref 0.1–1.0)
Monocytes Relative: 8.1 % (ref 3.0–12.0)
Neutro Abs: 3.9 10*3/uL (ref 1.4–7.7)
Neutrophils Relative %: 56.4 % (ref 43.0–77.0)
Platelets: 328 10*3/uL (ref 150.0–400.0)
RBC: 4.46 Mil/uL (ref 3.87–5.11)
RDW: 14.1 % (ref 11.5–15.5)
WBC: 6.9 10*3/uL (ref 4.0–10.5)

## 2021-04-10 LAB — POCT GLYCOSYLATED HEMOGLOBIN (HGB A1C)
HbA1c POC (<> result, manual entry): 7.2 % (ref 4.0–5.6)
Hemoglobin A1C: 7.2 % — AB (ref 4.0–5.6)

## 2021-04-10 LAB — IBC + FERRITIN
Ferritin: 63.9 ng/mL (ref 10.0–291.0)
Iron: 83 ug/dL (ref 42–145)
Saturation Ratios: 22 % (ref 20.0–50.0)
Transferrin: 270 mg/dL (ref 212.0–360.0)

## 2021-04-10 LAB — MICROALBUMIN / CREATININE URINE RATIO
Creatinine,U: 65.1 mg/dL
Microalb Creat Ratio: 1.7 mg/g (ref 0.0–30.0)
Microalb, Ur: 1.1 mg/dL (ref 0.0–1.9)

## 2021-04-10 MED ORDER — EMPAGLIFLOZIN 25 MG PO TABS: ORAL_TABLET | Freq: Every day | ORAL | 1 refills | Status: DC

## 2021-04-10 MED ORDER — VENLAFAXINE HCL ER 150 MG PO CP24: ORAL_CAPSULE | Freq: Every day | ORAL | 1 refills | Status: DC

## 2021-04-10 MED ORDER — ATORVASTATIN CALCIUM 20 MG PO TABS: 20.0000 mg | ORAL_TABLET | Freq: Every day | ORAL | 1 refills | Status: DC

## 2021-04-10 MED ORDER — INSULIN PEN NEEDLE 32G X 4 MM MISC: 5 refills | Status: DC

## 2021-04-10 MED ORDER — METFORMIN HCL 1000 MG PO TABS: 1.0000 | ORAL_TABLET | Freq: Two times a day (BID) | ORAL | 1 refills | Status: DC

## 2021-04-10 MED ORDER — OZEMPIC (1 MG/DOSE) 2 MG/1.5ML ~~LOC~~ SOPN: 1.0000 mg | PEN_INJECTOR | SUBCUTANEOUS | 3 refills | Status: DC

## 2021-04-10 MED ORDER — MONTELUKAST SODIUM 10 MG PO TABS: ORAL_TABLET | Freq: Every day | ORAL | 1 refills | Status: DC

## 2021-04-10 MED ORDER — TELMISARTAN-HCTZ 80-25 MG PO TABS: 1.0000 | ORAL_TABLET | Freq: Every day | ORAL | 1 refills | Status: DC

## 2021-04-10 MED ORDER — FERROUS SULFATE 325 (65 FE) MG PO TBEC: 325.0000 mg | DELAYED_RELEASE_TABLET | Freq: Every day | ORAL | 1 refills | Status: DC

## 2021-04-10 MED ORDER — OMEPRAZOLE 20 MG PO CPDR: 20.0000 mg | DELAYED_RELEASE_CAPSULE | Freq: Every day | ORAL | 1 refills | Status: DC

## 2021-04-10 MED ORDER — VERAPAMIL HCL ER 300 MG PO CP24: 1.0000 | ORAL_CAPSULE | Freq: Every day | ORAL | 1 refills | Status: DC

## 2021-04-10 NOTE — Addendum Note (Signed)
Addended by: Thressa Sheller on: 04/10/2021 08:44 AM   Modules accepted: Orders

## 2021-04-10 NOTE — Progress Notes (Signed)
Subjective:    Patient ID: Vanessa Higgins, female    DOB: Jun 01, 1951, 70 y.o.   MRN: [id number]  CC: Vanessa Higgins is a 70 y.o. female who presents today for follow up.   HPI: Feels well today  No complaints  DM- compliant with jardiance 25mg , metformin 1000mg  bid, and victoza 1.8mg . Endorses dietary indiscretion.   HTN- compliant with telmisartan-hczt 80-25,  verapamil 300mg . No cp. SOB improved.   HLD- compliant with lipitor 20mg    OSA - compliant with Cipap  Follow up with Dr Saunders Revel 01/2021 regarding sporadic PVCs, sob.   She continues to follow with Dr Patsey Berthold for asthma    HISTORY:  Past Medical History:  Diagnosis Date  . Allergy    hay fever  . Anemia   . Ankle fracture, left 04/08/2014  . Anxiety and depression   . Arthritis   . Asthma    well controlled   . Blood transfusion abn reaction or complication, no procedure mishap   . Blood transfusion without reported diagnosis   . COVID-19 virus infection 08/2020  . Depression   . Diabetes mellitus   . Disc displacement, lumbar   . Dysrhythmia    pvc's  . Fatty liver   . GERD (gastroesophageal reflux disease)   . Headache(784.0)   . History of hiatal hernia    small  . Hyperlipidemia   . Hypertension   . Joint pain   . Low vitamin B12 level   . Low vitamin D level   . Migraine   . Mitral valve prolapse   . Obstructive sleep apnea    CPAP  . Osteoarthritis   . Palpitations   . PONV (postoperative nausea and vomiting)    h/o in the past  . Sleep apnea 04/06/2017   uses CPAP  . SOB (shortness of breath)   . UTI (lower urinary tract infection)   . Vitamin B 12 deficiency   . Vitamin D deficiency   . Wears glasses    Past Surgical History:  Procedure Laterality Date  . APPENDECTOMY  1977  . COLONOSCOPY  last 11/17/2014  . INGUINAL HERNIA REPAIR Right 11/01/2020   Procedure: HERNIA REPAIR INGUINAL ADULT;  Surgeon: Robert Bellow, MD;  Location: [facility];  Service: General;  Laterality: Right;   . JOINT REPLACEMENT    . KNEE ARTHROPLASTY Right 03/03/2019   Procedure: COMPUTER ASSISTED TOTAL KNEE ARTHROPLASTY-RIGHT;  Surgeon: Dereck Leep, MD;  Location: [facility];  Service: Orthopedics;  Laterality: Right;  . LUMBAR LAMINECTOMY/DECOMPRESSION MICRODISCECTOMY Right 02/06/2018   Procedure: Lumbar two Hemilaminectomy for Discectomy;  Surgeon: Ashok Pall, MD;  Location: [place];  Service: Neurosurgery;  Laterality: Right;  . ORIF ANKLE FRACTURE Left 04/08/2014   Procedure: OPEN REDUCTION INTERNAL FIXATION (ORIF) ANKLE FRACTURE LEFT ANKLE FRACTURE OPEN TREATMENT BIMALLEOLAR ANKLE INCLUDES INTERNAL FIXATION;  Surgeon: Johnny Bridge, MD;  Location: [place];  Service: Orthopedics;  Laterality: Left;  . PAROTIDECTOMY  1980   left  . POLYPECTOMY    . TONSILLECTOMY AND ADENOIDECTOMY     as a child  . TOTAL KNEE ARTHROPLASTY Bilateral    left  . TUBAL LIGATION  05/1976   Family History  Problem Relation Age of Onset  . Sudden death Mother   . Cancer Mother 69       pancreatic  . Heart disease Father 22  . Sudden death Father   . Cancer Brother        colon  .  Colon cancer Brother 71  . Stroke Maternal Grandmother   . Heart disease Maternal Grandfather   . Cancer Sister        brain  . Breast cancer Neg Hx   . Colon polyps Neg Hx   . Esophageal cancer Neg Hx   . Rectal cancer Neg Hx   . Stomach cancer Neg Hx   . Thyroid cancer Neg Hx     Allergies: Sulfa antibiotics, Lisinopril, Other, and Ramipril Current Outpatient Medications on File Prior to Visit  Medication Sig Dispense Refill  . albuterol (PROVENTIL HFA;VENTOLIN HFA) 108 (90 Base) MCG/ACT inhaler Inhale 2 puffs into the lungs every 6 (six) hours as needed for wheezing or shortness of breath.    Marland Kitchen atorvastatin (LIPITOR) 20 MG tablet TAKE 1 TABLET (20 MG TOTAL) BY MOUTH DAILY. (Patient taking differently: Take 20 mg by mouth at bedtime.) 90 tablet 1  . b complex vitamins tablet Take 1 tablet by mouth  daily.    . cholecalciferol (VITAMIN D3) 25 MCG (1000 UNIT) tablet Take 1,000 Units by mouth daily.    Marland Kitchen docusate sodium (COLACE) 100 MG capsule Take 100 mg by mouth at bedtime.     . empagliflozin (JARDIANCE) 25 MG TABS tablet TAKE 1 TABLET BY MOUTH ONCE DAILY 90 tablet 1  . ferrous sulfate 325 (65 FE) MG EC tablet Take 1 tablet (325 mg total) by mouth 2 (two) times daily with a meal. (Patient taking differently: Take 325 mg by mouth at bedtime.) 60 tablet 2  . Fluticasone-Umeclidin-Vilant 100-62.5-25 MCG/INH AEPB INHALE 1 PUFF INTO THE LUNGS DAILY 60 each 6  . glucose blood test strip USE AS INSTRUCTED TO CHECK SUGAR UP TO 3 TIMES DAILY (Patient taking differently: USE AS INSTRUCTED TO CHECK SUGAR UP TO 3 TIMES DAILY) 100 strip 12  . Insulin Pen Needle 32G X 4 MM MISC USE TWICE A DAY AS DIRECTED WITH DIABETES MEDICATION PENS 200 each 5  . loratadine (CLARITIN) 10 MG tablet Take 10 mg by mouth at bedtime.     . metFORMIN (GLUCOPHAGE) 1000 MG tablet TAKE 1 TABLET BY MOUTH 2 TIMES DAILY WITH A MEAL. 60 tablet 3  . montelukast (SINGULAIR) 10 MG tablet TAKE 1 TABLET BY MOUTH AT BEDTIME 90 tablet 1  . Multiple Vitamin (MULTIVITAMIN WITH MINERALS) TABS tablet Take 1 tablet by mouth daily.    Marland Kitchen omeprazole (PRILOSEC) 20 MG capsule TAKE 1 CAPSULE BY MOUTH DAILY (Patient taking differently: Take 20 mg by mouth daily before breakfast.) 90 capsule 1  . telmisartan-hydrochlorothiazide (MICARDIS HCT) 80-25 MG tablet Take 1 tablet by mouth daily. 90 tablet 1  . TRELEGY ELLIPTA 200-62.5-25 MCG/INH AEPB INHALE 1 PUFF INTO THE LUNGS DAILY. 60 each 6  . venlafaxine XR (EFFEXOR-XR) 150 MG 24 hr capsule TAKE 1 CAPSULE BY MOUTH DAILY WITH BREAKFAST 90 capsule 1  . Verapamil HCl CR 300 MG CP24 TAKE 1 CAPSULE BY MOUTH DAILY 90 capsule 1  . vitamin B-12 (CYANOCOBALAMIN) 1000 MCG tablet Take 1,000 mcg by mouth at bedtime.     No current facility-administered medications on file prior to visit.    Social History    Tobacco Use  . Smoking status: Never Smoker  . Smokeless tobacco: Never Used  Vaping Use  . Vaping Use: Never used  Substance Use Topics  . Alcohol use: No  . Drug use: No    Review of Systems  Constitutional: Negative for chills and fever.  Respiratory: Positive for shortness of breath (improved). Negative  for cough.   Cardiovascular: Negative for chest pain and palpitations.  Gastrointestinal: Negative for nausea and vomiting.      Objective:    BP 104/62 (BP Location: Left Arm, Patient Position: Sitting, Cuff Size: Large)   Pulse 73   Temp 98 F (36.7 C) (Oral)   Ht 5\' 3"  (1.6 m)   Wt 200 lb 3.2 oz (90.8 kg)   SpO2 93%   BMI 35.46 kg/m  BP Readings from Last 3 Encounters:  04/10/21 104/62  04/04/21 (!) 96/58  02/14/21 110/64   Wt Readings from Last 3 Encounters:  04/10/21 200 lb 3.2 oz (90.8 kg)  04/04/21 199 lb (90.3 kg)  02/14/21 199 lb 4 oz (90.4 kg)    Physical Exam Vitals reviewed.  Constitutional:      Appearance: She is well-developed.  Eyes:     Conjunctiva/sclera: Conjunctivae normal.  Cardiovascular:     Rate and Rhythm: Normal rate and regular rhythm.     Pulses: Normal pulses.     Heart sounds: Normal heart sounds.  Pulmonary:     Effort: Pulmonary effort is normal.     Breath sounds: Normal breath sounds. No wheezing, rhonchi or rales.  Skin:    General: Skin is warm and dry.  Neurological:     Mental Status: She is alert.  Psychiatric:        Speech: Speech normal.        Behavior: Behavior normal.        Thought Content: Thought content normal.        Assessment & Plan:   Problem List Items Addressed This Visit      Cardiovascular and Mediastinum   Essential hypertension - Primary    Controlled. Continue  telmisartan-hczt 80-25,  verapamil 300mg .      Relevant Orders   CBC with Differential/Platelet   IBC + Ferritin   Comprehensive metabolic panel     Endocrine   Diabetes mellitus ([place])    Lab Results  Component  Value Date   HGBA1C 6.9 (H) 12/27/2020   Elevated. Stop victoza, start ozempic 1mg  to see if more effective. Continue jardiance 25mg , metformin 1000mg  bid.      Relevant Medications   Semaglutide, 1 MG/DOSE, (OZEMPIC, 1 MG/DOSE,) 2 MG/1.5ML SOPN   Other Relevant Orders   Urinalysis, Routine w reflex microscopic   Microalbumin / creatinine urine ratio   POCT HgB A1C (Completed)     Other   Hyperlipidemia LDL goal <70    Controlled. Continue lipitor 20mg           I have discontinued Kaile S. Blass's liraglutide, ciprofloxacin, and phenazopyridine. I am also having her start on Ozempic (1 MG/DOSE). Additionally, I am having her maintain her b complex vitamins, loratadine, albuterol, ferrous sulfate, docusate sodium, multivitamin with minerals, vitamin B-12, Trelegy Ellipta, metFORMIN, telmisartan-hydrochlorothiazide, cholecalciferol, Verapamil HCl CR, empagliflozin, montelukast, venlafaxine XR, Fluticasone-Umeclidin-Vilant, omeprazole, atorvastatin, Insulin Pen Needle, and glucose blood.   Meds ordered this encounter  Medications  . Semaglutide, 1 MG/DOSE, (OZEMPIC, 1 MG/DOSE,) 2 MG/1.5ML SOPN    Sig: Inject 1 mg into the skin once a week.    Dispense:  3 mL    Refill:  3    Order Specific Question:   Supervising Provider    Answer:   Crecencio Mc [[id number]]    Return precautions given.   Risks, benefits, and alternatives of the medications and treatment plan prescribed today were discussed, and patient expressed understanding.   Education regarding symptom management  and diagnosis given to patient on AVS.  Continue to follow with Burnard Hawthorne, FNP for routine health maintenance.   [name] and I agreed with plan.   Mable Paris, FNP

## 2021-04-10 NOTE — Assessment & Plan Note (Signed)
Controlled. Continue  telmisartan-hczt 80-25,  verapamil 300mg .

## 2021-04-10 NOTE — Assessment & Plan Note (Signed)
Controlled. Continue lipitor 20mg 

## 2021-04-10 NOTE — Patient Instructions (Signed)
Stop victoza Start ozempic   Call me if fasting blood sugar is not less than 150.   Nice to see you!

## 2021-04-10 NOTE — Assessment & Plan Note (Signed)
Lab Results  Component Value Date   HGBA1C 6.9 (H) 12/27/2020   Elevated. Stop victoza, start ozempic 1mg  to see if more effective. Continue jardiance 25mg , metformin 1000mg  bid.

## 2021-04-12 ENCOUNTER — Other Ambulatory Visit: Payer: Self-pay

## 2021-04-12 DIAGNOSIS — R7989 Other specified abnormal findings of blood chemistry: Secondary | ICD-10-CM

## 2021-04-13 ENCOUNTER — Other Ambulatory Visit: Payer: Self-pay

## 2021-04-16 ENCOUNTER — Encounter: Payer: Self-pay | Admitting: Family

## 2021-04-18 ENCOUNTER — Other Ambulatory Visit: Payer: Self-pay | Admitting: Family

## 2021-04-18 ENCOUNTER — Telehealth: Payer: Self-pay

## 2021-04-18 DIAGNOSIS — I10 Essential (primary) hypertension: Secondary | ICD-10-CM

## 2021-04-18 NOTE — Chronic Care Management (AMB) (Signed)
  Chronic Care Management   Note  04/18/2021 Name: Vanessa Higgins MRN: [id number] DOB: [birth year]  Vanessa Higgins is a 70 y.o. year old female who is a primary care patient of Burnard Hawthorne, FNP. I reached out to Vanessa Higgins by phone today in response to a referral sent by Ms. Pollyann Kennedy Rodd's PCP, Burnard Hawthorne, FNP     Ms. Memon was given information about Chronic Care Management services today including:  1. CCM service includes personalized support from designated clinical staff supervised by her physician, including individualized plan of care and coordination with other care providers 2. 24/7 contact phone numbers for assistance for urgent and routine care needs. 3. Service will only be billed when office clinical staff spend 20 minutes or more in a month to coordinate care. 4. Only one practitioner may furnish and bill the service in a calendar month. 5. The patient may stop CCM services at any time (effective at the end of the month) by phone call to the office staff. 6. The patient will be responsible for cost sharing (co-pay) of up to 20% of the service fee (after annual deductible is met).  Patient agreed to services and verbal consent obtained.   Follow up plan: Telephone appointment with care management team member scheduled for:04/27/2021  Noreene Larsson, [place], [place], [place] [address] Direct Dial: [phone] [email or web address] Website: [email or web address]

## 2021-04-20 ENCOUNTER — Telehealth: Payer: Self-pay

## 2021-04-20 NOTE — Telephone Encounter (Signed)
I have tried patient multiple times on cell & cannot reach. Pt had already left work & was not home yet.

## 2021-04-20 NOTE — Telephone Encounter (Signed)
Pt called and back & she will pick up Ozempic 1mg  in fridge labeled & 4 boxes of 25mg  Jardiance that is in my cabinet on Monday.

## 2021-04-20 NOTE — Progress Notes (Signed)
4/12 labs are resulted. Should I just fax?

## 2021-04-24 NOTE — Progress Notes (Signed)
Labs faxed

## 2021-04-27 ENCOUNTER — Ambulatory Visit (INDEPENDENT_AMBULATORY_CARE_PROVIDER_SITE_OTHER): Payer: Medicare HMO | Admitting: Pharmacist

## 2021-04-27 DIAGNOSIS — I10 Essential (primary) hypertension: Secondary | ICD-10-CM

## 2021-04-27 DIAGNOSIS — J45909 Unspecified asthma, uncomplicated: Secondary | ICD-10-CM

## 2021-04-27 DIAGNOSIS — I499 Cardiac arrhythmia, unspecified: Secondary | ICD-10-CM

## 2021-04-27 DIAGNOSIS — Z794 Long term (current) use of insulin: Secondary | ICD-10-CM

## 2021-04-27 DIAGNOSIS — E785 Hyperlipidemia, unspecified: Secondary | ICD-10-CM | POA: Diagnosis not present

## 2021-04-27 DIAGNOSIS — E1169 Type 2 diabetes mellitus with other specified complication: Secondary | ICD-10-CM | POA: Diagnosis not present

## 2021-04-27 NOTE — Chronic Care Management (AMB) (Signed)
Chronic Care Management Pharmacy Note  04/27/2021 Name:  Vanessa Higgins MRN:  [id number] DOB:  1951-02-13  Subjective: Vanessa Higgins is an 70 y.o. year old female who is a primary patient of Burnard Hawthorne, FNP.  The CCM team was consulted for assistance with disease management and care coordination needs.    Engaged with patient by telephone for initial visit in response to provider referral for pharmacy case management and/or care coordination services.   Consent to Services:  The patient was given information about Chronic Care Management services, agreed to services, and gave verbal consent prior to initiation of services.  Please see initial visit note for detailed documentation.   Patient Care Team: Burnard Hawthorne, FNP as PCP - General (Family Medicine) End, Harrell Gave, MD as PCP - Cardiology (Cardiology) De Hollingshead, RPH-CPP (Pharmacist)  Recent office visits:  4/12 - PCP -change Victoza to Ozempic, continue Jardiance, metformin, lipitor  Recent consult visits: None recently  Hospital visits: None in previous 6 months  Objective:  Lab Results  Component Value Date   CREATININE 0.80 04/10/2021   CREATININE 0.68 01/09/2021   CREATININE 0.54 12/27/2020    Lab Results  Component Value Date   HGBA1C 7.2 (A) 04/10/2021   HGBA1C 7.2 04/10/2021   Last diabetic Eye exam:  Lab Results  Component Value Date/Time   HMDIABEYEEXA No Retinopathy 01/26/2020 12:00 AM    Last diabetic Foot exam:  Lab Results  Component Value Date/Time   HMDIABFOOTEX Normal 06/07/2015 12:00 AM        Component Value Date/Time   CHOL 105 07/12/2020 1451   CHOL 102 06/28/2013 0711   TRIG 70 07/12/2020 1451   TRIG 81 06/28/2013 0711   HDL 41 07/12/2020 1451   HDL 44 06/28/2013 0711   CHOLHDL 5 03/22/2020 1201   VLDL 21.8 03/22/2020 1201   VLDL 16 06/28/2013 0711   LDLCALC 49 07/12/2020 1451   LDLCALC 42 06/28/2013 0711    Hepatic Function Latest Ref Rng &  Units 04/10/2021 12/27/2020 09/12/2020  Total Protein 6.0 - 8.3 g/dL 7.6 7.0 8.3(H)  Albumin 3.5 - 5.2 g/dL 4.2 4.3 4.0  AST 0 - 37 U/L _0 ALT 0 - 35 U/L 32 16 21  Alk Phosphatase 39 - 117 U/L 45 41 38  Total Bilirubin 0.2 - 1.2 mg/dL 0.8 1.0 1.0    Lab Results  Component Value Date/Time   TSH 1.290 05/10/2020 03:39 PM   TSH 0.87 10/12/2019 09:37 AM    CBC Latest Ref Rng & Units 04/10/2021 12/27/2020 09/12/2020  WBC 4.0 - 10.5 K/uL 6.9 5.7 8.1  Hemoglobin 12.0 - 15.0 g/dL 14.1 13.6 13.0  Hematocrit 36.0 - 46.0 % 42.0 41.1 43.1  Platelets 150.0 - 400.0 K/uL 328.0 251.0 452(H)    Lab Results  Component Value Date/Time   VD25OH 36.9 05/10/2020 03:46 PM   VD25OH 44.31 10/12/2019 09:37 AM   VD25OH 37.32 06/17/2018 09:06 AM   Clinical ASCVD: No    Social History   Tobacco Use  Smoking Status Never Smoker  Smokeless Tobacco Never Used   BP Readings from Last 3 Encounters:  04/10/21 104/62  04/04/21 (!) 96/58  02/14/21 110/64   Pulse Readings from Last 3 Encounters:  04/10/21 73  04/04/21 91  02/14/21 73   Wt Readings from Last 3 Encounters:  04/10/21 200 lb 3.2 oz (90.8 kg)  04/04/21 199 lb (90.3 kg)  02/14/21 199 lb 4 oz (90.4 kg)  Assessment: Review of patient past medical history, allergies, medications, health status, including review of consultants reports, laboratory and other test data, was performed as part of comprehensive evaluation and provision of chronic care management services.   SDOH:  (Social Determinants of Health) assessments and interventions performed:  SDOH Interventions   Flowsheet Row Most Recent Value  SDOH Interventions   Financial Strain Interventions Other (Comment)  [manufacturer assistance]      CCM Care Plan  Allergies  Allergen Reactions  . Sulfa Antibiotics Hives, Shortness Of Breath and Rash  . Lisinopril Cough  . Other Other (See Comments) and Nausea And Vomiting  . Ramipril Cough    Medications Reviewed Today     Reviewed by De Hollingshead, RPH-CPP (Pharmacist) on 04/27/21 at 51  Med List Status: <None>  Medication Order Taking? Sig Documenting Provider Last Dose Status Informant  albuterol (PROVENTIL HFA;VENTOLIN HFA) 108 (90 Base) MCG/ACT inhaler [id number] No Inhale 2 puffs into the lungs every 6 (six) hours as needed for wheezing or shortness of breath.  Patient not taking: Reported on 04/27/2021   [provider] Not Taking Active Self  atorvastatin (LIPITOR) 20 MG tablet [id number] Yes Take 1 tablet (20 mg total) by mouth at bedtime. Burnard Hawthorne, FNP Taking Active   Cholecalciferol (VITAMIN D) 125 MCG (5000 UT) CAPS [id number] Yes Take 5,000 Units by mouth daily. [provider] Taking Active   docusate sodium (COLACE) 100 MG capsule [id number]  Take 100 mg by mouth at bedtime.  [provider]  Active Self  empagliflozin (JARDIANCE) 25 MG TABS tablet [id number] Yes TAKE 1 TABLET BY MOUTH ONCE DAILY Arnett, Yvetta Coder, FNP Taking Active   ferrous sulfate 325 (65 FE) MG EC tablet [id number] Yes Take 1 tablet (325 mg total) by mouth at bedtime. Burnard Hawthorne, FNP Taking Active   Fluticasone-Umeclidin-Vilant 100-62.5-25 MCG/INH AEPB [id number] Yes INHALE 1 PUFF INTO THE LUNGS DAILY Tyler Pita, MD Taking Active   glucose blood test strip [id number]  USE AS INSTRUCTED TO CHECK SUGAR UP TO 3 TIMES DAILY  Patient taking differently: USE AS INSTRUCTED TO CHECK SUGAR UP TO 3 TIMES DAILY   Crecencio Mc, MD  Active   Insulin Pen Needle 32G X 4 MM MISC [id number] No USE TWICE A DAY AS DIRECTED WITH DIABETES MEDICATION PENS  Patient not taking: Reported on 04/27/2021   Burnard Hawthorne, FNP Not Taking Active   loratadine (CLARITIN) 10 MG tablet [id number] Yes Take 10 mg by mouth at bedtime.  [provider] Taking Active Self  metFORMIN (GLUCOPHAGE) 1000 MG tablet [id number] Yes Take 1 tablet (1,000 mg total) by mouth 2 (two) times daily with a meal. Burnard Hawthorne, FNP Taking Active   montelukast (SINGULAIR) 10 MG tablet [id number] Yes TAKE 1 TABLET BY MOUTH AT BEDTIME Burnard Hawthorne, FNP Taking Active   Multiple Vitamin (MULTIVITAMIN WITH MINERALS) TABS tablet [id number] Yes Take 1 tablet by mouth daily. [provider] Taking Active   omeprazole (PRILOSEC) 20 MG capsule [id number] Yes Take 1 capsule (20 mg total) by mouth daily before breakfast. Burnard Hawthorne, FNP Taking Active   Semaglutide, 1 MG/DOSE, (OZEMPIC, 1 MG/DOSE,) 2 MG/1.5ML SOPN [id number] Yes Inject 1 mg into the skin once a week. Burnard Hawthorne, FNP Taking Active   telmisartan-hydrochlorothiazide (MICARDIS HCT) 80-25 MG tablet [id number] Yes Take 1 tablet by mouth daily. Burnard Hawthorne, FNP Taking Active   venlafaxine XR (EFFEXOR-XR) 150 MG 24 hr capsule [id number]  Yes TAKE 1 CAPSULE BY MOUTH DAILY WITH BREAKFAST Burnard Hawthorne, FNP Taking Active   Verapamil HCl CR 300 MG CP24 [id number] Yes TAKE 1 CAPSULE BY MOUTH DAILY Arnett, Yvetta Coder, FNP Taking Active   vitamin B-12 (CYANOCOBALAMIN) 1000 MCG tablet [id number] Yes Take 1,000 mcg by mouth at bedtime. [provider] Taking Active           Patient Active Problem List   Diagnosis Date Noted  . Lower respiratory tract infection 09/21/2020  . URI (upper respiratory infection) 08/30/2020  . Lower respiratory tract infection due to 2019 novel coronavirus 08/2020  . UTI (urinary tract infection)   . Hyperlipidemia associated with type 2 diabetes mellitus ([place]) 07/14/2020  . Low serum vitamin B12 07/14/2020  . Shortness of breath 05/05/2020  . Dysuria 03/22/2020  . Left foot pain 09/01/2019  . H/O adenomatous polyp of colon 03/03/2019  . Total knee replacement status 03/03/2019  . Depression, major, single episode, complete remission ([place]) 11/18/2018  . HNP (herniated nucleus pulposus), lumbar 02/06/2018  . Fatty liver disease, nonalcoholic 95/63/8756  . GERD (gastroesophageal reflux  disease) 10/15/2017  . PVC's (premature ventricular contractions) 08/01/2017  . OSA (obstructive sleep apnea) 04/21/2017  . Vertigo 03/26/2017  . Elevated liver enzymes 12/03/2016  . Cough 06/05/2016  . Irregular heart rate 06/05/2016  . Vitamin D deficiency 12/02/2014  . IDA (iron deficiency anemia) 10/20/2014  . Routine general medical examination at a health care facility 05/03/2013  . Diabetes mellitus ([place]) 07/09/2012  . Essential hypertension 07/09/2012  . Hyperlipidemia LDL goal <70 07/09/2012  . Asthma 07/09/2012  . Class 1 obesity due to excess calories with body mass index (BMI) of 33.0 to 33.9 in adult 07/09/2012    Immunization History  Administered Date(s) Administered  . Fluad Quad(high Dose 65+) 10/02/2019  . Influenza-Unspecified 09/02/2014, 10/02/2015, 09/28/2020  . MMR 07/09/2010  . PFIZER(Purple Top)SARS-COV-2 Vaccination 12/02/2019, 01/04/2020, 12/14/2020  . Pneumococcal Conjugate-13 12/02/2014  . Pneumococcal Polysaccharide-23 07/09/2001, 12/04/2016  . Td 07/09/2010  . Zoster 10/27/2013    Conditions to be addressed/monitored: HTN, HLD, DMII and Pulmonary Disease  Care Plan : Medication Management  Updates made by De Hollingshead, RPH-CPP since 04/27/2021 12:00 AM    Problem: Diabetes, Hypertension, Hyperlipidemia, Pulmonary Disease     Long-Range Goal: Disease Progression Prevention   Start Date: 04/27/2021  This Visit's Progress: On track  Priority: High  Note:   Current Barriers:  . Unable to independently afford treatment regimen  Pharmacist Clinical Goal(s):  Marland Kitchen Over the next 90 days, patient will verbalize ability to afford treatment regimen . Over the next 90 days, patient will achieve control of diabetes as evidenced by A1c through collaboration with PharmD and provider.   Interventions: . 1:1 collaboration with Burnard Hawthorne, FNP regarding development and update of comprehensive plan of care as evidenced by provider attestation and  co-signature . Inter-disciplinary care team collaboration (see longitudinal plan of care) . Comprehensive medication review performed; medication list updated in electronic medical record  Diabetes: . Uncontrolled; current treatment: metformin 1000 mg BID, Jardiance 25 mg daily, Ozempic 1 mg weekly  . Reports that copay has become excessive for Ozempic and Jardiance.  . Discussed income. Patient qualifies for [employer] assistance through [employer]. Will prepare patient assistance applications for her to come sign next week. She will bring proof of income.  . Patient is over income for [place] assistance. However, she would qualify for [place] assistance. Recommend d/c Jardiance and start Farxiga 10 mg  daily through [employer] patient assistance. Will discuss w/ PCP. Will prepare patient assistance applications for her to come sign next week. She will bring proof of income.   Hypertension: . Controlled; current treatment: telmisartan/HCTZ 80/25, verapamil 300 mg; follows w/ Dr. Fletcher Anon . Recommend to continue current regimen at this time along with cardiology collaboration.   Hyperlipidemia: . Controlled; current treatment: atorvastatin 20 mg . Recommend to continue current regimen along with cardiology collaboration.   Asthma: . Uncontrolled without treatment; current treatment: Trelegy 100/62.5/25 mcg daily (is out), montelukast 10 mg daily, loratadine 10 mg daily, albuterol HFA PRN . Patient is over income for Trelegy assistance. However, she would qualify for [facility] assistance. Recommend d/c Trelegy and start Breztri 160/9/4.8 2 puffs twice daily through [employer] patient assistance. Will discuss w/ PCP. Will prepare sample and patient portion of application.   Depression/Anxiety: . Controlled per patient report; current regimen: venlafaxine XR 150 mg daily  . Continue current regimen at this time. Will discuss support needs with patient moving forward  GERD?: . Controlled per  patient report; current regimen: omeprazole 20 mg daily . Continue current regimen along with avoidance of trigger foods.   Supplements: Vitamin D3, ferrous sulfate, Vitamin B12, colace PRN  Patient Goals/Self-Care Activities . Over the next 90 days, patient will:  - take medications as prescribed check glucose daily, document, and provide at future appointments collaborate with provider on medication access solutions  Follow Up Plan: Telephone follow up appointment with care management team member scheduled for: ~ 6 weeks     Medication Assistance: Working on patient assistance plan  Patient's preferred pharmacy is:  [id number] [place], [id number]: [phone]: [phone]  Follow Up:  Patient agrees to Care Plan and Follow-up.  Plan: Telephone follow up appointment with care management team member scheduled for:  ~ 6 weeks  Catie TDarnelle Maffucci PharmD, BPoncha [place] CEldoradoClinical Pharmacist LOccidental Petroleumat BJohnson & [phone]

## 2021-04-27 NOTE — Patient Instructions (Signed)
Visit Information   PATIENT GOALS:  Goals Addressed              This Visit's Progress     Patient Stated   .  Medication Monitoring (pt-stated)        Patient Goals/Self-Care Activities . Over the next 90 days, patient will:  - take medications as prescribed check glucose daily, document, and provide at future appointments collaborate with provider on medication access solutions       Consent to CCM Services: Ms. Stong was given information about Chronic Care Management services today including:  1. CCM service includes personalized support from designated clinical staff supervised by her physician, including individualized plan of care and coordination with other care providers 2. 24/7 contact phone numbers for assistance for urgent and routine care needs. 3. Service will only be billed when office clinical staff spend 20 minutes or more in a month to coordinate care. 4. Only one practitioner may furnish and bill the service in a calendar month. 5. The patient may stop CCM services at any time (effective at the end of the month) by phone call to the office staff. 6. The patient will be responsible for cost sharing (co-pay) of up to 20% of the service fee (after annual deductible is met).  Patient agreed to services and verbal consent obtained.   Patient verbalizes understanding of instructions provided today and agrees to view in [place].   Plan: Telephone follow up appointment with care management team member scheduled for:  ~ 6 weeks  Catie Darnelle Maffucci, PharmD, [place], CPP Clinical Pharmacist [place] [facility]: Patient Care Plan: Medication Management    Problem Identified: Diabetes, Hypertension, Hyperlipidemia, Pulmonary Disease     Long-Range Goal: Disease Progression Prevention   Start Date: 04/27/2021  This Visit's Progress: On track  Priority: High  Note:   Current Barriers:  . Unable to independently  afford treatment regimen  Pharmacist Clinical Goal(s):  Marland Kitchen Over the next 90 days, patient will verbalize ability to afford treatment regimen . Over the next 90 days, patient will achieve control of diabetes as evidenced by A1c through collaboration with PharmD and provider.   Interventions: . 1:1 collaboration with Burnard Hawthorne, FNP regarding development and update of comprehensive plan of care as evidenced by provider attestation and co-signature . Inter-disciplinary care team collaboration (see longitudinal plan of care) . Comprehensive medication review performed; medication list updated in electronic medical record  Diabetes: . Uncontrolled; current treatment: metformin 1000 mg BID, Jardiance 25 mg daily, Ozempic 1 mg weekly  . Reports that copay has become excessive for Ozempic and Jardiance.  . Discussed income. Patient qualifies for [employer] assistance through [employer]. Will prepare patient assistance applications for her to come sign next week. She will bring proof of income.  . Patient is over income for [place] assistance. However, she would qualify for [place] assistance. Recommend d/c Jardiance and start Farxiga 10 mg daily through [employer] patient assistance. Will discuss w/ PCP. Will prepare patient assistance applications for her to come sign next week. She will bring proof of income.   Hypertension: . Controlled; current treatment: telmisartan/HCTZ 80/25, verapamil 300 mg; follows w/ Dr. Fletcher Anon . Recommend to continue current regimen at this time along with cardiology collaboration.   Hyperlipidemia: . Controlled; current treatment: atorvastatin 20 mg . Recommend to continue current regimen along with cardiology collaboration.   Asthma: . Uncontrolled without treatment; current treatment: Trelegy 100/62.5/25 mcg daily (is out),  montelukast 10 mg daily, loratadine 10 mg daily, albuterol HFA PRN . Patient is over income for Trelegy assistance. However, she would  qualify for [facility] assistance. Recommend d/c Trelegy and start Breztri 160/9/4.8 2 puffs twice daily through [employer] patient assistance. Will discuss w/ PCP. Will prepare sample and patient portion of application.   Depression/Anxiety: . Controlled per patient report; current regimen: venlafaxine XR 150 mg daily  . Continue current regimen at this time. Will discuss support needs with patient moving forward  GERD?: . Controlled per patient report; current regimen: omeprazole 20 mg daily . Continue current regimen along with avoidance of trigger foods.   Supplements: Vitamin D3, ferrous sulfate, Vitamin B12, colace PRN  Patient Goals/Self-Care Activities . Over the next 90 days, patient will:  - take medications as prescribed check glucose daily, document, and provide at future appointments collaborate with provider on medication access solutions  Follow Up Plan: Telephone follow up appointment with care management team member scheduled for: ~ 6 weeks

## 2021-05-02 ENCOUNTER — Telehealth: Payer: Self-pay | Admitting: Family

## 2021-05-02 DIAGNOSIS — E1169 Type 2 diabetes mellitus with other specified complication: Secondary | ICD-10-CM

## 2021-05-02 DIAGNOSIS — J45909 Unspecified asthma, uncomplicated: Secondary | ICD-10-CM

## 2021-05-02 MED ORDER — DAPAGLIFLOZIN PROPANEDIOL 10 MG PO TABS: 10.0000 mg | ORAL_TABLET | Freq: Every day | ORAL | 1 refills | Status: DC

## 2021-05-02 MED ORDER — BREZTRI AEROSPHERE 160-9-4.8 MCG/ACT IN AERO: 2.0000 | INHALATION_SPRAY | Freq: Two times a day (BID) | RESPIRATORY_TRACT | 11 refills | Status: DC

## 2021-05-02 NOTE — Progress Notes (Signed)
I have collaborated with the care management provider regarding care management and care coordination activities outlined in this encounter and have reviewed this encounter including documentation in the note and care plan. I am certifying that I agree with the content of this note and encounter as supervising physician.   Mable Paris, NP

## 2021-05-02 NOTE — Telephone Encounter (Signed)
FYI Vanessa Higgins  [name] pt Reviewed Vanessa Higgins's note and agree with changes and patient assistance Stop jardiance; start farxiga 10mg  Stop trelegy; start Breztri  I have sent to pharmacy  Also, in reviewing chart, I thought she followed with pulmonology for asthma- does she not?  If not , does she think necessary to re-establish care?

## 2021-05-02 NOTE — Telephone Encounter (Signed)
She's aware of the proposed changes. She provided information for patient assistance application, and we'll collect your signature when you are back in office, Joycelyn Schmid.   She does follow yearly w/ Patsey Berthold, but since this was a bit time sensitive in terms of access to diabetes medications, I decided to collaborate with you on this. I can have the patient assistance paperwork faxed to Dr. Domingo Dimes office instead, if you would prefer.   Catie

## 2021-05-02 NOTE — Telephone Encounter (Signed)
LMTCB

## 2021-05-02 NOTE — Telephone Encounter (Signed)
Perfect! Thank you Catie!

## 2021-05-04 NOTE — Telephone Encounter (Signed)
Happy to sign Pt assistance catie  Judson Roch, if you would reiterate to patient that I have signed today  ( placed on your desk) and also that she needs to continue to follow up with dr Patsey Berthold

## 2021-05-04 NOTE — Telephone Encounter (Signed)
Catie beat me to this & got papers to fax!:)

## 2021-05-08 ENCOUNTER — Telehealth: Payer: Self-pay | Admitting: Pharmacy Technician

## 2021-05-08 NOTE — Progress Notes (Signed)
[facility])                                            [place] Team    05/08/2021  Vanessa Higgins [birth year] [id number]                                      Medication Assistance Referral  Referral From: [place]ard RPh Catie T.   Medication/Company: Larna Daughters / Novo Nordisk Patient application portion:  N/A Signed in clinic Provider application portion:  N/A Signed in clinic to Mable Paris, [place] Provider address/fax verified via: Office website  Medication/Company: Wilder Glade / [place] Patient application portion:  N/A Signed in clinic Provider application portion:  N/A Signed in clinic to Mable Paris, [place] Provider address/fax verified via: Office website  Medication/Company: Judithann Sauger / [place] Patient application portion:  N/A Signed in clinic Provider application portion:  N/A Signed in clinic to Mable Paris, [place] Provider address/fax verified via: Office website  Received both patient and provider portion(s) of patient assistance application(s) for [name], [name] and [name]. Faxed completed application and required documents into [employer] and [employer] respectively.   Laneya Gasaway P. Fynley Chrystal, [place]  [phone]

## 2021-05-11 ENCOUNTER — Telehealth: Payer: Self-pay

## 2021-05-11 NOTE — Telephone Encounter (Signed)
FYI Vanessa Higgins I will save paperwork until I hear otherwise, but NovoNordisk approved patient medications through 11/28/21. A 4 four month supply is being send to office within the next 10-14 business days.

## 2021-05-11 NOTE — Telephone Encounter (Signed)
Please bring me the letter so that I can scan to my CPhT's email. Thanks!

## 2021-05-14 NOTE — Telephone Encounter (Signed)
Done

## 2021-05-16 ENCOUNTER — Telehealth: Payer: Self-pay | Admitting: Pharmacy Technician

## 2021-05-16 DIAGNOSIS — Z596 Low income: Secondary | ICD-10-CM

## 2021-05-16 NOTE — Progress Notes (Signed)
[facility])                                            [place] Team    05/16/2021  Vanessa Higgins June 01, 1951 [id number]  2 care coordination calls placed to [employer] in regards to [place] application and to [employer] in regards to [place] application.  Spoke to [employer] at [employer] who informs patient was APPROVED 05/09/21-12/29/21. She informed medication would be delivered to provider's office in the next 10-15 business days.  Spoke to [place] at [place] and she informs patient was APPROVED 05/12/21-12/29/21. She informed medications would be delivered to patient's home in the next 10-15 business days.  Successful outreach call placed to patient, HIPAA verified. Informed patient of her approval and to be expecting the medications in the next few weeks.  [name] P. Kalijah Zeiss, [place]  [phone]

## 2021-05-31 ENCOUNTER — Other Ambulatory Visit: Payer: Self-pay

## 2021-05-31 ENCOUNTER — Other Ambulatory Visit: Payer: Self-pay | Admitting: Family

## 2021-05-31 DIAGNOSIS — Z1231 Encounter for screening mammogram for malignant neoplasm of breast: Secondary | ICD-10-CM

## 2021-06-01 ENCOUNTER — Telehealth: Payer: Self-pay

## 2021-06-01 ENCOUNTER — Other Ambulatory Visit: Payer: Self-pay

## 2021-06-01 ENCOUNTER — Ambulatory Visit: Payer: Medicare HMO | Attending: Internal Medicine

## 2021-06-01 DIAGNOSIS — Z23 Encounter for immunization: Secondary | ICD-10-CM

## 2021-06-01 MED ORDER — PFIZER-BIONT COVID-19 VAC-TRIS 30 MCG/0.3ML IM SUSP
INTRAMUSCULAR | 0 refills | Status: DC
  Filled 2021-06-01: qty 0.3, 1d supply, fill #0

## 2021-06-01 NOTE — Telephone Encounter (Signed)
I called patient to inform her that we received her Ozempic 4 boxes from [employer]. Patient has an appointment on Tuesday for labs & will pick up then. Labeled & placed in fridge.

## 2021-06-01 NOTE — Progress Notes (Signed)
   Covid-19 Vaccination Clinic  Name:  Vanessa Higgins    MRN: [id number] DOB: [birth year]  06/01/2021  Vanessa Higgins was observed post Covid-19 immunization for 15 minutes without incident. She was provided with Vaccine Information Sheet and instruction to access the V-Safe system.   Vanessa Higgins was instructed to call 911 with any severe reactions post vaccine: Marland Kitchen Difficulty breathing  . Swelling of face and throat  . A fast heartbeat  . A bad rash all over body  . Dizziness and weakness   Immunizations Administered    Name Date Dose VIS Date Route   PFIZER Comrnaty(Gray TOP) Covid-19 Vaccine 06/01/2021 12:38 PM 0.3 mL 12/07/2020 Intramuscular   Manufacturer: [place]   Lot: [id number]   NDC: [place], PharmD, MBA Clinical Acute Care Pharmacist

## 2021-06-05 ENCOUNTER — Other Ambulatory Visit (INDEPENDENT_AMBULATORY_CARE_PROVIDER_SITE_OTHER): Payer: Medicare HMO

## 2021-06-05 ENCOUNTER — Other Ambulatory Visit: Payer: Self-pay

## 2021-06-05 DIAGNOSIS — R7989 Other specified abnormal findings of blood chemistry: Secondary | ICD-10-CM | POA: Diagnosis not present

## 2021-06-05 LAB — CBC WITH DIFFERENTIAL/PLATELET
Basophils Absolute: 0 10*3/uL (ref 0.0–0.1)
Basophils Relative: 0.6 % (ref 0.0–3.0)
Eosinophils Absolute: 0.4 10*3/uL (ref 0.0–0.7)
Eosinophils Relative: 4.7 % (ref 0.0–5.0)
HCT: 42.3 % (ref 36.0–46.0)
Hemoglobin: 14.1 g/dL (ref 12.0–15.0)
Lymphocytes Relative: 28.4 % (ref 12.0–46.0)
Lymphs Abs: 2.1 10*3/uL (ref 0.7–4.0)
MCHC: 33.4 g/dL (ref 30.0–36.0)
MCV: 94.5 fl (ref 78.0–100.0)
Monocytes Absolute: 0.7 10*3/uL (ref 0.1–1.0)
Monocytes Relative: 9 % (ref 3.0–12.0)
Neutro Abs: 4.3 10*3/uL (ref 1.4–7.7)
Neutrophils Relative %: 57.3 % (ref 43.0–77.0)
Platelets: 257 10*3/uL (ref 150.0–400.0)
RBC: 4.47 Mil/uL (ref 3.87–5.11)
RDW: 13.6 % (ref 11.5–15.5)
WBC: 7.5 10*3/uL (ref 4.0–10.5)

## 2021-06-07 ENCOUNTER — Ambulatory Visit (INDEPENDENT_AMBULATORY_CARE_PROVIDER_SITE_OTHER): Payer: Medicare HMO | Admitting: Pharmacist

## 2021-06-07 ENCOUNTER — Other Ambulatory Visit: Payer: Self-pay

## 2021-06-07 DIAGNOSIS — E785 Hyperlipidemia, unspecified: Secondary | ICD-10-CM

## 2021-06-07 DIAGNOSIS — Z794 Long term (current) use of insulin: Secondary | ICD-10-CM | POA: Diagnosis not present

## 2021-06-07 DIAGNOSIS — I10 Essential (primary) hypertension: Secondary | ICD-10-CM

## 2021-06-07 DIAGNOSIS — J45909 Unspecified asthma, uncomplicated: Secondary | ICD-10-CM | POA: Diagnosis not present

## 2021-06-07 DIAGNOSIS — E1169 Type 2 diabetes mellitus with other specified complication: Secondary | ICD-10-CM | POA: Diagnosis not present

## 2021-06-07 NOTE — Patient Instructions (Signed)
Visit Information  PATIENT GOALS:  Goals Addressed               This Visit's Progress     Patient Stated     Medication Monitoring (pt-stated)        Patient Goals/Self-Care Activities Over the next 90 days, patient will:  - take medications as prescribed check glucose daily, document, and provide at future appointments collaborate with provider on medication access solutions         Patient verbalizes understanding of instructions provided today and agrees to view in [place].   Telephone follow up appointment with care management team member scheduled for: ~ 12 weeks  Catie Darnelle Maffucci, PharmD, [place], [place] Clinical Pharmacist [employer] at [employer] [phone]

## 2021-06-07 NOTE — Chronic Care Management (AMB) (Signed)
Chronic Care Management Pharmacy Note  06/07/2021 Name:  Vanessa Higgins MRN:  [id number] DOB:  14-Jun-1951   Subjective: Vanessa Higgins is an 70 y.o. year old female who is a primary patient of Burnard Hawthorne, FNP.  The CCM team was consulted for assistance with disease management and care coordination needs.    Engaged with patient by telephone for follow up visit in response to provider referral for pharmacy case management and/or care coordination services.   Consent to Services:  The patient was given information about Chronic Care Management services, agreed to services, and gave verbal consent prior to initiation of services.  Please see initial visit note for detailed documentation.   Patient Care Team: Burnard Hawthorne, FNP as PCP - General (Family Medicine) End, Harrell Gave, MD as PCP - Cardiology (Cardiology) De Hollingshead, RPH-CPP (Pharmacist)  Recent office visits: None since our last call  Recent consult visits: None since our last call  Hospital visits: None in previous 6 months  Objective:  Lab Results  Component Value Date   CREATININE 0.80 04/10/2021   CREATININE 0.68 01/09/2021   CREATININE 0.54 12/27/2020    Lab Results  Component Value Date   HGBA1C 7.2 (A) 04/10/2021   HGBA1C 7.2 04/10/2021   Last diabetic Eye exam:  Lab Results  Component Value Date/Time   HMDIABEYEEXA No Retinopathy 01/26/2020 12:00 AM    Last diabetic Foot exam:  Lab Results  Component Value Date/Time   HMDIABFOOTEX Normal 06/07/2015 12:00 AM        Component Value Date/Time   CHOL 105 07/12/2020 1451   CHOL 102 06/28/2013 0711   TRIG 70 07/12/2020 1451   TRIG 81 06/28/2013 0711   HDL 41 07/12/2020 1451   HDL 44 06/28/2013 0711   CHOLHDL 5 03/22/2020 1201   VLDL 21.8 03/22/2020 1201   VLDL 16 06/28/2013 0711   LDLCALC 49 07/12/2020 1451   LDLCALC 42 06/28/2013 0711    Hepatic Function Latest Ref Rng & Units 04/10/2021 12/27/2020 09/12/2020  Total  Protein 6.0 - 8.3 g/dL 7.6 7.0 8.3(H)  Albumin 3.5 - 5.2 g/dL 4.2 4.3 4.0  AST 0 - 37 U/L $Remo'30 20 26  'AuBzN$ ALT 0 - 35 U/L 32 16 21  Alk Phosphatase 39 - 117 U/L 45 41 38  Total Bilirubin 0.2 - 1.2 mg/dL 0.8 1.0 1.0    Lab Results  Component Value Date/Time   TSH 1.290 05/10/2020 03:39 PM   TSH 0.87 10/12/2019 09:37 AM    CBC Latest Ref Rng & Units 06/05/2021 04/10/2021 12/27/2020  WBC 4.0 - 10.5 K/uL 7.5 6.9 5.7  Hemoglobin 12.0 - 15.0 g/dL 14.1 14.1 13.6  Hematocrit 36.0 - 46.0 % 42.3 42.0 41.1  Platelets 150.0 - 400.0 K/uL 257.0 328.0 251.0    Lab Results  Component Value Date/Time   VD25OH 36.9 05/10/2020 03:46 PM   VD25OH 44.31 10/12/2019 09:37 AM   VD25OH 37.32 06/17/2018 09:06 AM    Clinical ASCVD: No  The ASCVD Risk score Mikey Bussing DC Jr., et al., 2013) failed to calculate for the following reasons:   The valid total cholesterol range is 130 to 320 mg/dL     Social History   Tobacco Use  Smoking Status Never  Smokeless Tobacco Never   BP Readings from Last 3 Encounters:  04/10/21 104/62  04/04/21 (!) 96/58  02/14/21 110/64   Pulse Readings from Last 3 Encounters:  04/10/21 73  04/04/21 91  02/14/21 73   Wt  Readings from Last 3 Encounters:  04/10/21 200 lb 3.2 oz (90.8 kg)  04/04/21 199 lb (90.3 kg)  02/14/21 199 lb 4 oz (90.4 kg)    Assessment: Review of patient past medical history, allergies, medications, health status, including review of consultants reports, laboratory and other test data, was performed as part of comprehensive evaluation and provision of chronic care management services.   SDOH:  (Social Determinants of Health) assessments and interventions performed:  SDOH Interventions    Flowsheet Row Most Recent Value  SDOH Interventions   Financial Strain Interventions Other (Comment)  [manufacturer assistance]       CCM Care Plan  Allergies  Allergen Reactions   Sulfa Antibiotics Hives, Shortness Of Breath and Rash   Lisinopril Cough    Other Other (See Comments) and Nausea And Vomiting   Ramipril Cough    Medications Reviewed Today     Reviewed by De Hollingshead, RPH-CPP (Pharmacist) on 06/07/21 at [phone]  Med List Status: <None>   Medication Order Taking? Sig Documenting Provider Last Dose Status Informant  albuterol (PROVENTIL HFA;VENTOLIN HFA) 108 (90 Base) MCG/ACT inhaler [id number]  Inhale 2 puffs into the lungs every 6 (six) hours as needed for wheezing or shortness of breath.  Patient not taking: Reported on 04/27/2021   [provider]  Active Self  atorvastatin (LIPITOR) 20 MG tablet [id number] Yes Take 1 tablet (20 mg total) by mouth at bedtime. Burnard Hawthorne, FNP Taking Active   Budeson-Glycopyrrol-Formoterol (BREZTRI AEROSPHERE) 160-9-4.8 MCG/ACT Hollie Salk [id number] Yes Inhale 2 puffs into the lungs in the morning and at bedtime. Burnard Hawthorne, FNP Taking Active   Cholecalciferol (VITAMIN D) 125 MCG (5000 UT) CAPS [id number] No Take 5,000 Units by mouth daily.  Patient not taking: Reported on 06/07/2021   [provider] Not Taking Active   COVID-19 mRNA Vac-TriS, Pfizer, (PFIZER-BIONT COVID-19 VAC-TRIS) SUSP injection [id number]  Inject into the muscle. Carlyle Basques, MD  Active   dapagliflozin propanediol (FARXIGA) 10 MG TABS tablet [id number] Yes Take 1 tablet (10 mg total) by mouth daily before breakfast. Burnard Hawthorne, FNP Taking Active            Med Note Nat Christen Jun 07, 2021  9:49 AM) Finishing supply of Jardiance 25 mg daily then will start Farxiga   docusate sodium (COLACE) 100 MG capsule [id number]  Take 100 mg by mouth at bedtime.  [provider]  Active Self  loratadine (CLARITIN) 10 MG tablet [id number] Yes Take 10 mg by mouth at bedtime.  [provider] Taking Active Self  metFORMIN (GLUCOPHAGE) 1000 MG tablet [id number] Yes Take 1 tablet (1,000 mg total) by mouth 2 (two) times daily with a meal. Burnard Hawthorne, FNP Taking Active    montelukast (SINGULAIR) 10 MG tablet [id number] Yes TAKE 1 TABLET BY MOUTH AT BEDTIME Burnard Hawthorne, FNP Taking Active   Multiple Vitamin (MULTIVITAMIN WITH MINERALS) TABS tablet [id number] Yes Take 1 tablet by mouth daily. [provider] Taking Active   omeprazole (PRILOSEC) 20 MG capsule [id number] Yes Take 1 capsule (20 mg total) by mouth daily before breakfast. Burnard Hawthorne, FNP Taking Active   Semaglutide, 1 MG/DOSE, (OZEMPIC, 1 MG/DOSE,) 2 MG/1.5ML SOPN [id number] Yes Inject 1 mg into the skin once a week. Burnard Hawthorne, FNP Taking Active   telmisartan-hydrochlorothiazide (MICARDIS HCT) 80-25 MG tablet [id number] Yes Take 1 tablet by mouth daily. Burnard Hawthorne, FNP Taking Active   venlafaxine XR (EFFEXOR-XR) 150  MG 24 hr capsule [id number] Yes TAKE 1 CAPSULE BY MOUTH DAILY WITH BREAKFAST Burnard Hawthorne, FNP Taking Active   Verapamil HCl CR 300 MG CP24 [id number] Yes TAKE 1 CAPSULE BY MOUTH DAILY Arnett, Yvetta Coder, FNP Taking Active   vitamin B-12 (CYANOCOBALAMIN) 1000 MCG tablet [id number] Yes Take 1,000 mcg by mouth at bedtime. [provider] Taking Active             Patient Active Problem List   Diagnosis Date Noted   Lower respiratory tract infection 09/21/2020   URI (upper respiratory infection) 08/30/2020   Lower respiratory tract infection due to 2019 novel coronavirus 08/2020   UTI (urinary tract infection)    Hyperlipidemia associated with type 2 diabetes mellitus ([place]) 07/14/2020   Low serum vitamin B12 07/14/2020   Shortness of breath 05/05/2020   Dysuria 03/22/2020   Left foot pain 09/01/2019   H/O adenomatous polyp of colon 03/03/2019   Total knee replacement status 03/03/2019   Depression, major, single episode, complete remission ([place]) 11/18/2018   HNP (herniated nucleus pulposus), lumbar 02/06/2018   Fatty liver disease, nonalcoholic 45/80/9983   GERD (gastroesophageal reflux disease) 10/15/2017   PVC's (premature  ventricular contractions) 08/01/2017   OSA (obstructive sleep apnea) 04/21/2017   Vertigo 03/26/2017   Elevated liver enzymes 12/03/2016   Cough 06/05/2016   Irregular heart rate 06/05/2016   Vitamin D deficiency 12/02/2014   IDA (iron deficiency anemia) 10/20/2014   Routine general medical examination at a health care facility 05/03/2013   Diabetes mellitus ([place]) 07/09/2012   Essential hypertension 07/09/2012   Hyperlipidemia LDL goal <70 07/09/2012   Asthma 07/09/2012   Class 1 obesity due to excess calories with body mass index (BMI) of 33.0 to 33.9 in adult 07/09/2012    Immunization History  Administered Date(s) Administered   Fluad Quad(high Dose 65+) 10/02/2019   Influenza-Unspecified 09/02/2014, 10/02/2015, 09/28/2020   MMR 07/09/2010   PFIZER Comirnaty(Gray Top)Covid-19 Tri-Sucrose Vaccine 06/01/2021   PFIZER(Purple Top)SARS-COV-2 Vaccination 12/02/2019, 01/04/2020, 12/14/2020   Pneumococcal Conjugate-13 12/02/2014   Pneumococcal Polysaccharide-23 07/09/2001, 12/04/2016   Td 07/09/2010   Zoster, Live 10/27/2013    Conditions to be addressed/monitored: HTN, HLD, DMII, and Pulmonary Disease  Care Plan : Medication Management  Updates made by De Hollingshead, RPH-CPP since 06/07/2021 12:00 AM     Problem: Diabetes, Hypertension, Hyperlipidemia, Pulmonary Disease      Long-Range Goal: Disease Progression Prevention   Start Date: 04/27/2021  This Visit's Progress: On track  Recent Progress: On track  Priority: High  Note:   Current Barriers:  Unable to independently afford treatment regimen  Pharmacist Clinical Goal(s):  Over the next 90 days, patient will verbalize ability to afford treatment regimen Over the next 90 days, patient will achieve control of diabetes as evidenced by A1c through collaboration with PharmD and provider.   Interventions: 1:1 collaboration with Burnard Hawthorne, FNP regarding development and update of comprehensive plan of care as  evidenced by provider attestation and co-signature Inter-disciplinary care team collaboration (see longitudinal plan of care) Comprehensive medication review performed; medication list updated in electronic medical record  Health Maintenance: Has yearly eye exam scheduled later this year.  Reports that she has received Shingrix series, but this is not documented. She said she had received at [facility]. Contacted [facility]. They do not have Shingrix documented in either their old system or new system. The pharmacist also consulted [facility] and there is documentation that she received Zostavax in 2000 but no documentation  of Shingrix. Will send MyChart message to patient to review.  Discussed Tdap vaccination. Discussed that with Medicare, she needs to receive at her local pharmacy instead of our office (as we can only give Tdap). She will discuss w/ pharmacy today and talk to PCP at next appointment if prescription is needed.   Diabetes: Uncontrolled but improved; current treatment: metformin 1000 mg BID, Jardiance 25 mg daily- then will switch to [name] 10 mg daily, Ozempic 1 mg weekly  Does note some continued nausea since increasing Ozempic to 1 mg weekly. Nausea/vomiting at first, but this has resolved  Patient assistance through [place] through 12/29/21  Current glucose readings: 120-130s; one isolated reading in 200s; post prandial readings: 150-160s Discussed that she could try reducing Ozempic from 1 mg by 10 clicks to try an in between dose. Patient verbalizes understanding Recommend to continue Jardiance 25 mg daily until supply complete then switch to [name] to 10 mg daily. Continue metformin 1000 mg BID.   Hypertension: Controlled per home report; current treatment: telmisartan/HCTZ 80/25, verapamil 300 mg; follows w/ Dr. Fletcher Anon Current home readings: reports ~120/70s; all <130/80 Fill history up to date Recommend to continue current regimen at this time  along with cardiology collaboration.   Hyperlipidemia: Controlled per last lipid panel; current treatment: atorvastatin 20 mg Fill history up to date Recommend to continue current regimen along with cardiology collaboration.   Asthma: Appropriately managed; current treatment: Breztri 160/9/4.8 mcg 2 puffs twice daily (changed from Trelegy d/t cost concerns), montelukast 10 mg daily, loratadine 10 mg daily, albuterol HFA PRN - has not needed recently since starting Breztri Reviewed ref[place] procedure for [place] Continue current regimen along with follow up with Dr. Patsey Berthold.   Depression/Anxiety: Controlled per patient report; current regimen: venlafaxine XR 150 mg daily  Continue current regimen at this time.   GERD: Controlled per patient report; current regimen: omeprazole 20 mg daily Discussed hx omeprazole tx. Reports that after last endoscopy, GI recommended to continue PPI therapy.  Continue current regimen along with avoidance of trigger foods.   Supplements: Vitamin B12, colace PRN  Patient Goals/Self-Care Activities Over the next 90 days, patient will:  - take medications as prescribed check glucose daily, document, and provide at future appointments collaborate with provider on medication access solutions  Follow Up Plan: Telephone follow up appointment with care management team member scheduled for: ~ 12 weeks     Medication Assistance:  Ozempic obtained through [employer] medication assistance program.  Enrollment ends 11/28/21 . Breztri and [name] obtained through [employer] medication assistance program. Enrollment ends 12/29/21  Patient's preferred pharmacy is:  [id number] - [place], [address] [place] [address] Phone: [phone] Fax: [phone]   Follow Up:  Patient agrees to Care Plan and Follow-up.  Plan: Telephone follow up appointment with care management team  member scheduled for:  23 week  Catie Darnelle Maffucci, PharmD, [place], [place] Clinical Pharmacist [employer] at [employer] [phone]

## 2021-06-21 ENCOUNTER — Ambulatory Visit: Payer: Medicare HMO | Admitting: Pulmonary Disease

## 2021-06-21 ENCOUNTER — Encounter: Payer: Self-pay | Admitting: Pulmonary Disease

## 2021-06-21 ENCOUNTER — Other Ambulatory Visit: Payer: Self-pay

## 2021-06-21 VITALS — BP 98/60 | HR 79 | Temp 98.2°F | Ht 63.0 in | Wt 191.2 lb

## 2021-06-21 DIAGNOSIS — J449 Chronic obstructive pulmonary disease, unspecified: Secondary | ICD-10-CM

## 2021-06-21 DIAGNOSIS — R059 Cough, unspecified: Secondary | ICD-10-CM | POA: Diagnosis not present

## 2021-06-21 DIAGNOSIS — J4540 Moderate persistent asthma, uncomplicated: Secondary | ICD-10-CM | POA: Diagnosis not present

## 2021-06-21 NOTE — Patient Instructions (Signed)
Continue Vanessa Higgins  We will see him in follow-up in 4 months time.  We will obtain breathing test prior to that time.

## 2021-06-21 NOTE — Progress Notes (Signed)
Subjective:    Patient ID: Vanessa Higgins, female    DOB: 07-Apr-1951, 70 y.o.   MRN: [id number]  HPI Laykin is a 70 year old lifelong never smoker who presents for follow-up of asthma with chronic asthmatic bronchitis.  This is a scheduled visit.  We last saw her on 18 July 2020.  Time she was instructed to continue Trelegy which was working well for her.  However, because of insurance issues it was necessary to switch her over to [place] 2 puffs twice a day.  She is actually doing well with this medication.  She has not had any exacerbations since last seen.  He actually prefers the [place] to the Trelegy as it keeps her controlled throughout the day.  She has not had to use her rescue inhaler hardly at all.  Does not endorse any fevers, chills or sweats.  No hoarseness.  Cough is very rare and usually nonproductive but for the most part this symptom is now well controlled.  No lower extremity edema.  No orthopnea or paroxysmal nocturnal dyspnea.  She feels well and looks well.   Review of Systems A 10 point review of systems was performed and it is as noted above otherwise negative.  Patient Active Problem List   Diagnosis Date Noted   Lower respiratory tract infection 09/21/2020   URI (upper respiratory infection) 08/30/2020   Lower respiratory tract infection due to 2019 novel coronavirus 08/2020   UTI (urinary tract infection)    Hyperlipidemia associated with type 2 diabetes mellitus ([place]) 07/14/2020   Low serum vitamin B12 07/14/2020   Shortness of breath 05/05/2020   Dysuria 03/22/2020   Left foot pain 09/01/2019   H/O adenomatous polyp of colon 03/03/2019   Total knee replacement status 03/03/2019   Depression, major, single episode, complete remission ([place]) 11/18/2018   HNP (herniated nucleus pulposus), lumbar 02/06/2018   Fatty liver disease, nonalcoholic 79/01/4096   GERD (gastroesophageal reflux disease) 10/15/2017   PVC's (premature ventricular contractions) 08/01/2017    OSA (obstructive sleep apnea) 04/21/2017   Vertigo 03/26/2017   Elevated liver enzymes 12/03/2016   Cough 06/05/2016   Irregular heart rate 06/05/2016   Vitamin D deficiency 12/02/2014   IDA (iron deficiency anemia) 10/20/2014   Routine general medical examination at a health care facility 05/03/2013   Diabetes mellitus ([place]) 07/09/2012   Essential hypertension 07/09/2012   Hyperlipidemia LDL goal <70 07/09/2012   Asthma 07/09/2012   Class 1 obesity due to excess calories with body mass index (BMI) of 33.0 to 33.9 in adult 07/09/2012   Social History   Tobacco Use   Smoking status: Never   Smokeless tobacco: Never  Substance Use Topics   Alcohol use: No   Allergies  Allergen Reactions   Sulfa Antibiotics Hives, Shortness Of Breath and Rash   Lisinopril Cough   Other Other (See Comments) and Nausea And Vomiting   Ramipril Cough   Current Meds  Medication Sig   albuterol (PROVENTIL HFA;VENTOLIN HFA) 108 (90 Base) MCG/ACT inhaler Inhale 2 puffs into the lungs every 6 (six) hours as needed for wheezing or shortness of breath.   atorvastatin (LIPITOR) 20 MG tablet Take 1 tablet (20 mg total) by mouth at bedtime.   Budeson-Glycopyrrol-Formoterol (BREZTRI AEROSPHERE) 160-9-4.8 MCG/ACT AERO Inhale 2 puffs into the lungs in the morning and at bedtime.   Cholecalciferol (VITAMIN D) 125 MCG (5000 UT) CAPS Take 5,000 Units by mouth daily.   dapagliflozin propanediol (FARXIGA) 10 MG TABS tablet Take 1 tablet (10  mg total) by mouth daily before breakfast.   docusate sodium (COLACE) 100 MG capsule Take 100 mg by mouth at bedtime.    loratadine (CLARITIN) 10 MG tablet Take 10 mg by mouth at bedtime.    metFORMIN (GLUCOPHAGE) 1000 MG tablet Take 1 tablet (1,000 mg total) by mouth 2 (two) times daily with a meal.   montelukast (SINGULAIR) 10 MG tablet TAKE 1 TABLET BY MOUTH AT BEDTIME   Multiple Vitamin (MULTIVITAMIN WITH MINERALS) TABS tablet Take 1 tablet by mouth daily.   omeprazole  (PRILOSEC) 20 MG capsule Take 1 capsule (20 mg total) by mouth daily before breakfast.   Semaglutide, 1 MG/DOSE, (OZEMPIC, 1 MG/DOSE,) 2 MG/1.5ML SOPN Inject 1 mg into the skin once a week.   telmisartan-hydrochlorothiazide (MICARDIS HCT) 80-25 MG tablet Take 1 tablet by mouth daily.   venlafaxine XR (EFFEXOR-XR) 150 MG 24 hr capsule TAKE 1 CAPSULE BY MOUTH DAILY WITH BREAKFAST   Verapamil HCl CR 300 MG CP24 TAKE 1 CAPSULE BY MOUTH DAILY   vitamin B-12 (CYANOCOBALAMIN) 1000 MCG tablet Take 1,000 mcg by mouth at bedtime.   Immunization History  Administered Date(s) Administered   Fluad Quad(high Dose 65+) 10/02/2019   Influenza-Unspecified 09/02/2014, 10/02/2015, 09/28/2020   MMR 07/09/2010   PFIZER Comirnaty(Gray Top)Covid-19 Tri-Sucrose Vaccine 06/01/2021   PFIZER(Purple Top)SARS-COV-2 Vaccination 12/02/2019, 01/04/2020, 12/14/2020   Pneumococcal Conjugate-13 12/02/2014   Pneumococcal Polysaccharide-23 07/09/2001, 12/04/2016   Td 07/09/2010   Zoster, Live 10/27/2013       Objective:   Physical Exam BP 98/60 (BP Location: Left Arm, Patient Position: Sitting, Cuff Size: Normal)   Pulse 79   Temp 98.2 F (36.8 C) (Oral)   Ht $R'5\' 3"'uj$  (1.6 m)   Wt 191 lb 3.2 oz (86.7 kg)   SpO2 96%   BMI 33.87 kg/m   GENERAL: This is a well-developed obese woman in no acute respiratory distress.  She is fully ambulatory.  No conversational dyspnea. HEAD: Normocephalic, atraumatic. EYES: Pupils equal, round, reactive to light.  No scleral icterus. MOUTH: Nose/mouth/throat not examined due to masking requirements for COVID 19. NECK: Supple. No thyromegaly. No nodules. No JVD.  Trachea midline.  No crepitus. PULMONARY: Symmetrical air entry.  No adventitious sounds. CARDIOVASCULAR: S1 and S2. Regular rate and rhythm.  No rubs murmurs gallops heard. GASTROINTESTINAL: Obese abdomen, soft. MUSCULOSKELETAL: No joint deformity, no clubbing, no edema.  Increased AP diameter. NEUROLOGIC: No focal deficits  noted.  Speech is fluent.  Awake, alert, oriented. SKIN: Intact,warm,dry, limited exam: No rashes. PSYCH: Normal mood and behavior.      Assessment & Plan:     ICD-10-CM   1. Asthmatic bronchitis , chronic (HCC)  J44.9    Patient has element of asthmatic bronchitis Some airway remodeling from moderate persistent asthma Continue Breztri 2 puffs twice a day    2. Moderate persistent asthma without complication  M46.80 Pulmonary Function Test [facility] Only   Continue inhaler therapy Continue Singulair She is well compensated    3. Cough  R05.9    For the most part resolved Rare occurrence now     Orders Placed This Encounter  Procedures   Pulmonary Function Test [facility] Only    Standing Status:   Future    Standing Expiration Date:   06/21/2022    Scheduling Instructions:     3 months    Order Specific Question:   Full PFT: includes the following: basic spirometry, spirometry pre & post bronchodilator, diffusion capacity (DLCO), lung volumes    Answer:  Full PFT    Order Specific Question:   This test can only be performed at    Answer:   [facility]   Discussion:  Patient is doing well with regards to her asthma/asthmatic bronchitis.  Continue Breztri 2 puffs twice a day.  We will see her in follow-up in 4 months time.  Will obtain pulmonary function testing prior to that time to monitor disease.   Renold Don, MD [place] [facility]   *This note was dictated using voice recognition software/Dragon.  Despite best efforts to proofread, errors can occur which can change the meaning.  Any change was purely unintentional.

## 2021-06-22 ENCOUNTER — Encounter: Payer: Self-pay | Admitting: Pulmonary Disease

## 2021-07-18 ENCOUNTER — Ambulatory Visit: Payer: Medicare HMO | Admitting: Family

## 2021-08-01 ENCOUNTER — Ambulatory Visit: Payer: Medicare HMO | Admitting: Obstetrics and Gynecology

## 2021-08-06 ENCOUNTER — Other Ambulatory Visit: Payer: Self-pay

## 2021-08-06 ENCOUNTER — Ambulatory Visit (INDEPENDENT_AMBULATORY_CARE_PROVIDER_SITE_OTHER): Payer: Medicare HMO | Admitting: Family

## 2021-08-06 VITALS — BP 114/60 | HR 86 | Temp 97.9°F | Ht 63.0 in | Wt 191.4 lb

## 2021-08-06 DIAGNOSIS — E1169 Type 2 diabetes mellitus with other specified complication: Secondary | ICD-10-CM | POA: Diagnosis not present

## 2021-08-06 DIAGNOSIS — I10 Essential (primary) hypertension: Secondary | ICD-10-CM | POA: Diagnosis not present

## 2021-08-06 DIAGNOSIS — E559 Vitamin D deficiency, unspecified: Secondary | ICD-10-CM | POA: Diagnosis not present

## 2021-08-06 DIAGNOSIS — E785 Hyperlipidemia, unspecified: Secondary | ICD-10-CM | POA: Diagnosis not present

## 2021-08-06 DIAGNOSIS — E119 Type 2 diabetes mellitus without complications: Secondary | ICD-10-CM | POA: Diagnosis not present

## 2021-08-06 DIAGNOSIS — Z780 Asymptomatic menopausal state: Secondary | ICD-10-CM

## 2021-08-06 DIAGNOSIS — Z794 Long term (current) use of insulin: Secondary | ICD-10-CM

## 2021-08-06 MED ORDER — METFORMIN HCL 1000 MG PO TABS: 1000.0000 mg | ORAL_TABLET | Freq: Two times a day (BID) | ORAL | 1 refills | Status: DC

## 2021-08-06 MED ORDER — FREESTYLE LITE TEST VI STRP: 1.0000 | ORAL_STRIP | 2 refills | Status: DC | PRN

## 2021-08-06 NOTE — Progress Notes (Signed)
Subjective:    Patient ID: Vanessa Higgins, female    DOB: 09-27-1951, 70 y.o.   MRN: [id number]  CC: Vanessa Higgins is a 70 y.o. female who presents today for follow up.   HPI: Patient feels well today.  No complaints.  DM-she is no longer on Jardiance.  She is compliant with Farxiga 10 mg,metformin 1000 mg BID, Ozempic 1 mg weekly  HTN- compliant with telmisartan/HCTZ 80/25, verapamil 300 mg. She follows w/ Dr. Fletcher Anon  HLD- compliant with atorvastatin '20mg'$   Eye exam is scheduled 11/2021   Patient is history of asthmatic bronchitis and following with Dr. Patsey Berthold, pulmonology.  She is compliant with those treatment, Breztri,Singulair. HISTORY:  Past Medical History:  Diagnosis Date   Allergy    hay fever   Anemia    Ankle fracture, left 04/08/2014   Anxiety and depression    Arthritis    Asthma    well controlled    Blood transfusion abn reaction or complication, no procedure mishap    Blood transfusion without reported diagnosis    COVID-19 virus infection 08/2020   Depression    Diabetes mellitus    Disc displacement, lumbar    Dysrhythmia    pvc's   Fatty liver    GERD (gastroesophageal reflux disease)    Headache(784.0)    History of hiatal hernia    small   Hyperlipidemia    Hypertension    Joint pain    Low vitamin B12 level    Low vitamin D level    Migraine    Mitral valve prolapse    Obstructive sleep apnea    CPAP   Osteoarthritis    Palpitations    PONV (postoperative nausea and vomiting)    h/o in the past   Sleep apnea 04/06/2017   uses CPAP   SOB (shortness of breath)    UTI (lower urinary tract infection)    Vitamin B 12 deficiency    Vitamin D deficiency    Wears glasses    Past Surgical History:  Procedure Laterality Date   APPENDECTOMY  1977   COLONOSCOPY  last 11/17/2014   INGUINAL HERNIA REPAIR Right 11/01/2020   Procedure: HERNIA REPAIR INGUINAL ADULT;  Surgeon: Robert Bellow, MD;  Location: [facility];  Service: General;   Laterality: Right;   JOINT REPLACEMENT     KNEE ARTHROPLASTY Right 03/03/2019   Procedure: COMPUTER ASSISTED TOTAL KNEE ARTHROPLASTY-RIGHT;  Surgeon: Dereck Leep, MD;  Location: [facility];  Service: Orthopedics;  Laterality: Right;   LUMBAR LAMINECTOMY/DECOMPRESSION MICRODISCECTOMY Right 02/06/2018   Procedure: Lumbar two Hemilaminectomy for Discectomy;  Surgeon: Ashok Pall, MD;  Location: [place];  Service: Neurosurgery;  Laterality: Right;   ORIF ANKLE FRACTURE Left 04/08/2014   Procedure: OPEN REDUCTION INTERNAL FIXATION (ORIF) ANKLE FRACTURE LEFT ANKLE FRACTURE OPEN TREATMENT BIMALLEOLAR ANKLE INCLUDES INTERNAL FIXATION;  Surgeon: Johnny Bridge, MD;  Location: [place];  Service: Orthopedics;  Laterality: Left;   PAROTIDECTOMY  1980   left   POLYPECTOMY     TONSILLECTOMY AND ADENOIDECTOMY     as a child   TOTAL KNEE ARTHROPLASTY Bilateral    left   TUBAL LIGATION  05/1976   Family History  Problem Relation Age of Onset   Sudden death Mother    Cancer Mother 33       pancreatic   Heart disease Father 44   Sudden death Father    Cancer Brother  colon   Colon cancer Brother 46   Stroke Maternal Grandmother    Heart disease Maternal Grandfather    Cancer Sister        brain   Breast cancer Neg Hx    Colon polyps Neg Hx    Esophageal cancer Neg Hx    Rectal cancer Neg Hx    Stomach cancer Neg Hx    Thyroid cancer Neg Hx     Allergies: Sulfa antibiotics, Lisinopril, Other, and Ramipril Current Outpatient Medications on File Prior to Visit  Medication Sig Dispense Refill   albuterol (PROVENTIL HFA;VENTOLIN HFA) 108 (90 Base) MCG/ACT inhaler Inhale 2 puffs into the lungs every 6 (six) hours as needed for wheezing or shortness of breath.     atorvastatin (LIPITOR) 20 MG tablet Take 1 tablet (20 mg total) by mouth at bedtime. 90 tablet 1   Budeson-Glycopyrrol-Formoterol (BREZTRI AEROSPHERE) 160-9-4.8 MCG/ACT AERO Inhale 2 puffs into the lungs in the  morning and at bedtime. 10.7 g 11   Cholecalciferol (VITAMIN D) 125 MCG (5000 UT) CAPS Take 5,000 Units by mouth daily.     COVID-19 mRNA Vac-TriS, Pfizer, (PFIZER-BIONT COVID-19 VAC-TRIS) SUSP injection Inject into the muscle. 0.3 mL 0   dapagliflozin propanediol (FARXIGA) 10 MG TABS tablet Take 1 tablet (10 mg total) by mouth daily before breakfast. 90 tablet 1   docusate sodium (COLACE) 100 MG capsule Take 100 mg by mouth at bedtime.      loratadine (CLARITIN) 10 MG tablet Take 10 mg by mouth at bedtime.      montelukast (SINGULAIR) 10 MG tablet TAKE 1 TABLET BY MOUTH AT BEDTIME 90 tablet 1   Multiple Vitamin (MULTIVITAMIN WITH MINERALS) TABS tablet Take 1 tablet by mouth daily.     omeprazole (PRILOSEC) 20 MG capsule Take 1 capsule (20 mg total) by mouth daily before breakfast. 90 capsule 1   Semaglutide, 1 MG/DOSE, (OZEMPIC, 1 MG/DOSE,) 2 MG/1.5ML SOPN Inject 1 mg into the skin once a week. 3 mL 3   telmisartan-hydrochlorothiazide (MICARDIS HCT) 80-25 MG tablet Take 1 tablet by mouth daily. 90 tablet 1   venlafaxine XR (EFFEXOR-XR) 150 MG 24 hr capsule TAKE 1 CAPSULE BY MOUTH DAILY WITH BREAKFAST 90 capsule 1   Verapamil HCl CR 300 MG CP24 TAKE 1 CAPSULE BY MOUTH DAILY 90 capsule 1   vitamin B-12 (CYANOCOBALAMIN) 1000 MCG tablet Take 1,000 mcg by mouth at bedtime.     No current facility-administered medications on file prior to visit.    Social History   Tobacco Use   Smoking status: Never   Smokeless tobacco: Never  Vaping Use   Vaping Use: Never used  Substance Use Topics   Alcohol use: No   Drug use: No    Review of Systems  Constitutional:  Negative for chills and fever.  Respiratory:  Negative for cough.   Cardiovascular:  Negative for chest pain and palpitations.  Gastrointestinal:  Negative for nausea and vomiting.     Objective:    BP 114/60   Pulse 86   Temp 97.9 F (36.6 C) (Oral)   Ht '5\' 3"'$  (1.6 m)   Wt 191 lb 6.4 oz (86.8 kg)   SpO2 94%   BMI 33.90 kg/m   BP Readings from Last 3 Encounters:  08/06/21 114/60  06/21/21 98/60  04/10/21 104/62   Wt Readings from Last 3 Encounters:  08/06/21 191 lb 6.4 oz (86.8 kg)  06/21/21 191 lb 3.2 oz (86.7 kg)  04/10/21 200 lb 3.2  oz (90.8 kg)    Physical Exam Vitals reviewed.  Constitutional:      Appearance: She is well-developed.  Eyes:     Conjunctiva/sclera: Conjunctivae normal.  Cardiovascular:     Rate and Rhythm: Normal rate and regular rhythm.     Pulses: Normal pulses.     Heart sounds: Normal heart sounds.  Pulmonary:     Effort: Pulmonary effort is normal.     Breath sounds: Normal breath sounds. No wheezing, rhonchi or rales.  Skin:    General: Skin is warm and dry.  Neurological:     Mental Status: She is alert.  Psychiatric:        Speech: Speech normal.        Behavior: Behavior normal.        Thought Content: Thought content normal.       Assessment & Plan:   Problem List Items Addressed This Visit       Cardiovascular and Mediastinum   Essential hypertension    Excellent control.continue telmisartan/HCTZ 80/25, verapamil 300 mg         Endocrine   Diabetes mellitus ([place]) - Primary   Relevant Medications   metFORMIN (GLUCOPHAGE) 1000 MG tablet   Hyperlipidemia associated with type 2 diabetes mellitus ([place])    Pending A1c.  Anticipate well controlled.  Continue Farxiga 10 mg,metformin 1000 mg BID, Ozempic 1 mg weekly.  After I get A1c results, patient would like to increase Ozempic for aid in weight loss.       Relevant Medications   metFORMIN (GLUCOPHAGE) 1000 MG tablet     Other   Hyperlipidemia LDL goal <70    Pending lipid panel.  Continue Lipitor 20 mg       Vitamin D deficiency   Relevant Orders   VITAMIN D 25 Hydroxy (Vit-D Deficiency, Fractures)   Other Visit Diagnoses     Controlled type 2 diabetes mellitus without complication, without long-term current use of insulin (HCC)       Relevant Medications   metFORMIN (GLUCOPHAGE) 1000 MG  tablet   Other Relevant Orders   Hemoglobin A1c   Lipid panel   Asymptomatic menopausal state       Relevant Orders   DG Bone Density        I am having Brithany S. Biernat maintain her loratadine, albuterol, docusate sodium, multivitamin with minerals, vitamin B-12, Vitamin D, atorvastatin, montelukast, omeprazole, Ozempic (1 MG/DOSE), telmisartan-hydrochlorothiazide, venlafaxine XR, Verapamil HCl CR, dapagliflozin propanediol, Breztri Aerosphere, Pfizer-BioNT COVID-19 Vac-TriS, FREESTYLE LITE, and metFORMIN.   Meds ordered this encounter  Medications   glucose blood (FREESTYLE LITE) test strip    Sig: 1 each by Other route as needed for other. Use as instructed    Dispense:  100 each    Refill:  2   metFORMIN (GLUCOPHAGE) 1000 MG tablet    Sig: Take 1 tablet (1,000 mg total) by mouth 2 (two) times daily with a meal.    Dispense:  180 tablet    Refill:  1    Return precautions given.   Risks, benefits, and alternatives of the medications and treatment plan prescribed today were discussed, and patient expressed understanding.   Education regarding symptom management and diagnosis given to patient on AVS.  Continue to follow with Burnard Hawthorne, FNP for routine health maintenance.   [name] and I agreed with plan.   Mable Paris, FNP

## 2021-08-06 NOTE — Assessment & Plan Note (Signed)
Pending A1c.  Anticipate well controlled.  Continue Farxiga 10 mg,metformin 1000 mg BID, Ozempic 1 mg weekly.  After I get A1c results, patient would like to increase Ozempic for aid in weight loss.

## 2021-08-06 NOTE — Progress Notes (Signed)
Pre visit review using our clinic review tool, if applicable. No additional management support is needed unless otherwise documented below in the visit note. 

## 2021-08-06 NOTE — Assessment & Plan Note (Signed)
Pending lipid panel.  Continue Lipitor 20 mg

## 2021-08-06 NOTE — Assessment & Plan Note (Signed)
Excellent control.continue telmisartan/HCTZ 80/25, verapamil 300 mg

## 2021-08-15 ENCOUNTER — Other Ambulatory Visit: Payer: Self-pay | Admitting: Family

## 2021-08-15 ENCOUNTER — Other Ambulatory Visit: Payer: Self-pay

## 2021-08-15 ENCOUNTER — Other Ambulatory Visit (INDEPENDENT_AMBULATORY_CARE_PROVIDER_SITE_OTHER): Payer: Medicare HMO

## 2021-08-15 DIAGNOSIS — E559 Vitamin D deficiency, unspecified: Secondary | ICD-10-CM | POA: Diagnosis not present

## 2021-08-15 DIAGNOSIS — E119 Type 2 diabetes mellitus without complications: Secondary | ICD-10-CM | POA: Diagnosis not present

## 2021-08-15 DIAGNOSIS — E1169 Type 2 diabetes mellitus with other specified complication: Secondary | ICD-10-CM

## 2021-08-15 LAB — LIPID PANEL
Cholesterol: 119 mg/dL (ref 0–200)
HDL: 43.3 mg/dL (ref >39.00)
LDL Cholesterol: 58 mg/dL (ref 0–99)
NonHDL: 75.64
Total CHOL/HDL Ratio: 3
Triglycerides: 88 mg/dL (ref 0.0–149.0)
VLDL: 17.6 mg/dL (ref 0.0–40.0)

## 2021-08-15 LAB — HEMOGLOBIN A1C: Hgb A1c MFr Bld: 7.2 % — ABNORMAL HIGH (ref 4.6–6.5)

## 2021-08-15 LAB — VITAMIN D 25 HYDROXY (VIT D DEFICIENCY, FRACTURES): VITD: 71.97 ng/mL (ref 30.00–100.00)

## 2021-08-15 MED ORDER — SEMAGLUTIDE (2 MG/DOSE) 8 MG/3ML ~~LOC~~ SOPN: 2.0000 mg | PEN_INJECTOR | SUBCUTANEOUS | 2 refills | Status: DC

## 2021-08-16 ENCOUNTER — Other Ambulatory Visit: Payer: Self-pay | Admitting: Family

## 2021-08-17 ENCOUNTER — Ambulatory Visit: Payer: Medicare HMO | Admitting: Pharmacist

## 2021-08-17 ENCOUNTER — Telehealth: Payer: Self-pay | Admitting: Family

## 2021-08-17 DIAGNOSIS — E1169 Type 2 diabetes mellitus with other specified complication: Secondary | ICD-10-CM

## 2021-08-17 DIAGNOSIS — Z794 Long term (current) use of insulin: Secondary | ICD-10-CM

## 2021-08-17 DIAGNOSIS — I10 Essential (primary) hypertension: Secondary | ICD-10-CM

## 2021-08-17 DIAGNOSIS — E785 Hyperlipidemia, unspecified: Secondary | ICD-10-CM

## 2021-08-17 DIAGNOSIS — J4540 Moderate persistent asthma, uncomplicated: Secondary | ICD-10-CM

## 2021-08-17 NOTE — Telephone Encounter (Signed)
PT called to advise that she had Bloodwork on Wednesday and Arnett advise she would increase the Ozempic. PT wants [name] to tell Catie about this as she gets the Ozempic through [name].

## 2021-08-17 NOTE — Telephone Encounter (Signed)
See CCM documentation 

## 2021-08-17 NOTE — Chronic Care Management (AMB) (Signed)
Chronic Care Management Pharmacy Note  08/17/2021 Name:  Vanessa Higgins MRN:  [id number] DOB:  1951-01-30   Subjective: Vanessa Higgins is an 70 y.o. year old female who is a primary patient of Burnard Hawthorne, FNP.  The CCM team was consulted for assistance with disease management and care coordination needs.    Engaged with patient by telephone for  medication access  in response to provider referral for pharmacy case management and/or care coordination services.   Consent to Services:  The patient was given information about Chronic Care Management services, agreed to services, and gave verbal consent prior to initiation of services.  Please see initial visit note for detailed documentation.   Patient Care Team: Burnard Hawthorne, FNP as PCP - General (Family Medicine) End, Harrell Gave, MD as PCP - Cardiology (Cardiology) De Hollingshead, RPH-CPP (Pharmacist)    Objective:  Lab Results  Component Value Date   CREATININE 0.80 04/10/2021   CREATININE 0.68 01/09/2021   CREATININE 0.54 12/27/2020    Lab Results  Component Value Date   HGBA1C 7.2 (H) 08/15/2021   Last diabetic Eye exam:  Lab Results  Component Value Date/Time   HMDIABEYEEXA No Retinopathy 01/26/2020 12:00 AM    Last diabetic Foot exam:  Lab Results  Component Value Date/Time   HMDIABFOOTEX Normal 06/07/2015 12:00 AM        Component Value Date/Time   CHOL 119 08/15/2021 0805   CHOL 105 07/12/2020 1451   CHOL 102 06/28/2013 0711   TRIG 88.0 08/15/2021 0805   TRIG 81 06/28/2013 0711   HDL 43.30 08/15/2021 0805   HDL 41 07/12/2020 1451   HDL 44 06/28/2013 0711   CHOLHDL 3 08/15/2021 0805   VLDL 17.6 08/15/2021 0805   VLDL 16 06/28/2013 0711   LDLCALC 58 08/15/2021 0805   LDLCALC 49 07/12/2020 1451   LDLCALC 42 06/28/2013 0711    Hepatic Function Latest Ref Rng & Units 04/10/2021 12/27/2020 09/12/2020  Total Protein 6.0 - 8.3 g/dL 7.6 7.0 8.3(H)  Albumin 3.5 - 5.2 g/dL 4.2 4.3 4.0   AST 0 - 37 U/L _0 ALT 0 - 35 U/L 32 16 21  Alk Phosphatase 39 - 117 U/L 45 41 38  Total Bilirubin 0.2 - 1.2 mg/dL 0.8 1.0 1.0    Lab Results  Component Value Date/Time   TSH 1.290 05/10/2020 03:39 PM   TSH 0.87 10/12/2019 09:37 AM    CBC Latest Ref Rng & Units 06/05/2021 04/10/2021 12/27/2020  WBC 4.0 - 10.5 K/uL 7.5 6.9 5.7  Hemoglobin 12.0 - 15.0 g/dL 14.1 14.1 13.6  Hematocrit 36.0 - 46.0 % 42.3 42.0 41.1  Platelets 150.0 - 400.0 K/uL 257.0 328.0 251.0    Lab Results  Component Value Date/Time   VD25OH 71.97 08/15/2021 08:05 AM   VD25OH 36.9 05/10/2020 03:46 PM   VD25OH 44.31 10/12/2019 09:37 AM    Clinical ASCVD: No  The ASCVD Risk score Mikey Bussing DC Jr., et al., 2013) failed to calculate for the following reasons:   The valid total cholesterol range is 130 to 320 mg/dL     Social History   Tobacco Use  Smoking Status Never  Smokeless Tobacco Never   BP Readings from Last 3 Encounters:  08/06/21 114/60  06/21/21 98/60  04/10/21 104/62   Pulse Readings from Last 3 Encounters:  08/06/21 86  06/21/21 79  04/10/21 73   Wt Readings from Last 3 Encounters:  08/06/21 191 lb 6.4 oz (  86.8 kg)  06/21/21 191 lb 3.2 oz (86.7 kg)  04/10/21 200 lb 3.2 oz (90.8 kg)    Assessment: Review of patient past medical history, allergies, medications, health status, including review of consultants reports, laboratory and other test data, was performed as part of comprehensive evaluation and provision of chronic care management services.   SDOH:  (Social Determinants of Health) assessments and interventions performed:  SDOH Interventions    Flowsheet Row Most Recent Value  SDOH Interventions   Financial Strain Interventions Other (Comment)  [manufacturer assistance]       CCM Care Plan  Allergies  Allergen Reactions   Sulfa Antibiotics Hives, Shortness Of Breath and Rash   Lisinopril Cough   Other Other (See Comments) and Nausea And Vomiting   Ramipril Cough     Medications Reviewed Today     Reviewed by Burnard Hawthorne, FNP (Family Nurse Practitioner) on 08/06/21 at 1340  Med List Status: <None>   Medication Order Taking? Sig Documenting Provider Last Dose Status Informant  albuterol (PROVENTIL HFA;VENTOLIN HFA) 108 (90 Base) MCG/ACT inhaler [id number] Yes Inhale 2 puffs into the lungs every 6 (six) hours as needed for wheezing or shortness of breath. [provider] Taking Active   atorvastatin (LIPITOR) 20 MG tablet [id number] Yes Take 1 tablet (20 mg total) by mouth at bedtime. Burnard Hawthorne, FNP Taking Active   Budeson-Glycopyrrol-Formoterol (BREZTRI AEROSPHERE) 160-9-4.8 MCG/ACT Hollie Salk [id number] Yes Inhale 2 puffs into the lungs in the morning and at bedtime. Burnard Hawthorne, FNP Taking Active   Cholecalciferol (VITAMIN D) 125 MCG (5000 UT) CAPS [id number] Yes Take 5,000 Units by mouth daily. [provider] Taking Active   COVID-19 mRNA Vac-TriS, Pfizer, (PFIZER-BIONT COVID-19 VAC-TRIS) SUSP injection [id number] Yes Inject into the muscle. Carlyle Basques, MD Taking Active   dapagliflozin propanediol (FARXIGA) 10 MG TABS tablet [id number] Yes Take 1 tablet (10 mg total) by mouth daily before breakfast. Burnard Hawthorne, FNP Taking Active            Med Note Nat Christen Jun 07, 2021  9:49 AM) Finishing supply of Jardiance 25 mg daily then will start Farxiga   docusate sodium (COLACE) 100 MG capsule [id number] Yes Take 100 mg by mouth at bedtime.  [provider] Taking Active Self  glucose blood (FREESTYLE LITE) test strip [id number] Yes 1 each by Other route as needed for other. Use as instructed [provider] Taking Active Self  loratadine (CLARITIN) 10 MG tablet [id number] Yes Take 10 mg by mouth at bedtime.  [provider] Taking Active Self  metFORMIN (GLUCOPHAGE) 1000 MG tablet [id number] Yes Take 1 tablet (1,000 mg total) by mouth 2 (two) times daily with a meal. Burnard Hawthorne, FNP Taking Active   montelukast (SINGULAIR) 10 MG tablet [id number] Yes TAKE 1 TABLET BY MOUTH AT BEDTIME Burnard Hawthorne, FNP Taking Active   Multiple Vitamin (MULTIVITAMIN WITH MINERALS) TABS tablet [id number] Yes Take 1 tablet by mouth daily. [provider] Taking Active   omeprazole (PRILOSEC) 20 MG capsule [id number] Yes Take 1 capsule (20 mg total) by mouth daily before breakfast. Burnard Hawthorne, FNP Taking Active   Semaglutide, 1 MG/DOSE, (OZEMPIC, 1 MG/DOSE,) 2 MG/1.5ML SOPN [id number] Yes Inject 1 mg into the skin once a week. Burnard Hawthorne, FNP Taking Active   telmisartan-hydrochlorothiazide (MICARDIS HCT) 80-25 MG tablet [id number] Yes Take 1 tablet by mouth daily. Burnard Hawthorne, FNP Taking Active   venlafaxine XR [facility])  150 MG 24 hr capsule [id number] Yes TAKE 1 CAPSULE BY MOUTH DAILY WITH BREAKFAST Burnard Hawthorne, FNP Taking Active   Verapamil HCl CR 300 MG CP24 [id number] Yes TAKE 1 CAPSULE BY MOUTH DAILY Arnett, Yvetta Coder, FNP Taking Active   vitamin B-12 (CYANOCOBALAMIN) 1000 MCG tablet [id number] Yes Take 1,000 mcg by mouth at bedtime. [provider] Taking Active             Patient Active Problem List   Diagnosis Date Noted   Lower respiratory tract infection 09/21/2020   URI (upper respiratory infection) 08/30/2020   Lower respiratory tract infection due to 2019 novel coronavirus 08/2020   UTI (urinary tract infection)    Hyperlipidemia associated with type 2 diabetes mellitus ([place]) 07/14/2020   Low serum vitamin B12 07/14/2020   Shortness of breath 05/05/2020   Dysuria 03/22/2020   Left foot pain 09/01/2019   H/O adenomatous polyp of colon 03/03/2019   Total knee replacement status 03/03/2019   Depression, major, single episode, complete remission ([place]) 11/18/2018   HNP (herniated nucleus pulposus), lumbar 02/06/2018   Fatty liver disease, nonalcoholic 82/80/0349   GERD (gastroesophageal reflux disease)  10/15/2017   PVC's (premature ventricular contractions) 08/01/2017   OSA (obstructive sleep apnea) 04/21/2017   Vertigo 03/26/2017   Elevated liver enzymes 12/03/2016   Cough 06/05/2016   Irregular heart rate 06/05/2016   Vitamin D deficiency 12/02/2014   IDA (iron deficiency anemia) 10/20/2014   Routine general medical examination at a health care facility 05/03/2013   Diabetes mellitus ([place]) 07/09/2012   Essential hypertension 07/09/2012   Hyperlipidemia LDL goal <70 07/09/2012   Asthma 07/09/2012   Class 1 obesity due to excess calories with body mass index (BMI) of 33.0 to 33.9 in adult 07/09/2012    Immunization History  Administered Date(s) Administered   Fluad Quad(high Dose 65+) 10/02/2019   Influenza-Unspecified 09/02/2014, 10/02/2015, 09/28/2020   MMR 07/09/2010   PFIZER Comirnaty(Gray Top)Covid-19 Tri-Sucrose Vaccine 06/01/2021   PFIZER(Purple Top)SARS-COV-2 Vaccination 12/02/2019, 01/04/2020, 12/14/2020   Pneumococcal Conjugate-13 12/02/2014   Pneumococcal Polysaccharide-23 07/09/2001, 12/04/2016   Td 07/09/2010   Zoster, Live 10/27/2013    Conditions to be addressed/monitored: HTN, HLD, and DMII  Care Plan : Medication Management  Updates made by De Hollingshead, RPH-CPP since 08/17/2021 12:00 AM     Problem: Diabetes, Hypertension, Hyperlipidemia, Pulmonary Disease      Long-Range Goal: Disease Progression Prevention   Start Date: 04/27/2021  This Visit's Progress: On track  Recent Progress: On track  Priority: High  Note:   Current Barriers:  Unable to independently afford treatment regimen  Pharmacist Clinical Goal(s):  Over the next 90 days, patient will verbalize ability to afford treatment regimen Over the next 90 days, patient will achieve control of diabetes as evidenced by A1c through collaboration with PharmD and provider.   Interventions: 1:1 collaboration with Burnard Hawthorne, FNP regarding development and update of comprehensive  plan of care as evidenced by provider attestation and co-signature Inter-disciplinary care team collaboration (see longitudinal plan of care) Comprehensive medication review performed; medication list updated in electronic medical record  Health Maintenance: Has yearly eye exam scheduled later this year.  Reports that she has received Shingrix series, but this is not documented. Previously investigated - she said she had received at [facility], though they did not have Shingrix documented and nothing was in [place] for Shingrix. Previously discussed Tdap vaccination. Encouraged  Diabetes: Uncontrolled but improved; current treatment: metformin 1000 mg BID, Farxiga 10  mg daily, Ozempic 1 mg weekly - provider increased Ozempic to 2 mg weekly.  Patient assistance through [employer] through 12/29/21  Sending reorder request form to [employer].  Hypertension: Controlled per home report; current treatment: telmisartan/HCTZ 80/25, verapamil 300 mg; follows w/ Dr. Fletcher Anon Previously recommended to continue current regimen at this time along with cardiology collaboration.   Hyperlipidemia: Controlled per last lipid panel; current treatment: atorvastatin 20 mg Previously recommended to continue current regimen along with cardiology collaboration.   Asthma: Appropriately managed; current treatment: Breztri 160/9/4.8 mcg 2 puffs twice daily (changed from Trelegy d/t cost concerns), montelukast 10 mg daily, loratadine 10 mg daily, albuterol HFA PRN - has not needed recently since starting Breztri Previously recommended to continue current regimen along with follow up with Dr. Patsey Berthold.   Depression/Anxiety: Controlled per patient report; current regimen: venlafaxine XR 150 mg daily  Previously recommended to continue current regimen at this time.   GERD: Controlled per patient report; current regimen: omeprazole 20 mg daily Discussed hx omeprazole tx. Reports that  after last endoscopy, GI recommended to continue PPI therapy.  Previously recommended to continue current regimen along with avoidance of trigger foods.   Supplements: Vitamin B12, colace PRN  Patient Goals/Self-Care Activities Over the next 90 days, patient will:  - take medications as prescribed check glucose daily, document, and provide at future appointments collaborate with provider on medication access solutions  Follow Up Plan: Telephone follow up appointment with care management team member scheduled for: ~ 4 weeks as previously scheduled     Medication Assistance:  Ozempic obtained through [employer] medication assistance program.  Enrollment ends 11/28/21 . Breztri obtained through [employer] medication assistance program.  Enrollment ends 12/29/21  Patient's preferred pharmacy is:  [id number] [place], [id number]: [phone]: [phone]   Follow Up:  Patient agrees to Care Plan and Follow-up.  Plan: Telephone follow up appointment with care management team member scheduled for:  ~ 4 weeks as previously scheduled  Catie TDarnelle Maffucci PharmD, [place] C[place]antaClinical Pharmacist LOcc[place] [facility] [phone]

## 2021-08-17 NOTE — Patient Instructions (Signed)
Visit Information  PATIENT GOALS:  Goals Addressed               This Visit's Progress     Patient Stated     Medication Monitoring (pt-stated)        Patient Goals/Self-Care Activities Over the next 90 days, patient will:  - take medications as prescribed check glucose daily, document, and provide at future appointments collaborate with provider on medication access solutions        Patient verbalizes understanding of instructions provided today and agrees to view in [place].   Plan: Telephone follow up appointment with care management team member scheduled for:  ~ 4 weeks as previously scheduled  Catie Darnelle Maffucci, PharmD, [place], [place] Clinical Pharmacist [employer] at [employer] [phone]

## 2021-08-29 ENCOUNTER — Ambulatory Visit
Admission: RE | Admit: 2021-08-29 | Discharge: 2021-08-29 | Disposition: A | Discharge status: Home / Self Care | Payer: Medicare HMO | Source: Ambulatory Visit | Attending: Family | Admitting: Family

## 2021-08-29 ENCOUNTER — Ambulatory Visit: Payer: Medicare HMO | Admitting: Pharmacist

## 2021-08-29 ENCOUNTER — Other Ambulatory Visit: Payer: Self-pay

## 2021-08-29 DIAGNOSIS — M85832 Other specified disorders of bone density and structure, left forearm: Secondary | ICD-10-CM | POA: Diagnosis not present

## 2021-08-29 DIAGNOSIS — E785 Hyperlipidemia, unspecified: Secondary | ICD-10-CM

## 2021-08-29 DIAGNOSIS — I10 Essential (primary) hypertension: Secondary | ICD-10-CM

## 2021-08-29 DIAGNOSIS — Z794 Long term (current) use of insulin: Secondary | ICD-10-CM

## 2021-08-29 DIAGNOSIS — E1169 Type 2 diabetes mellitus with other specified complication: Secondary | ICD-10-CM

## 2021-08-29 DIAGNOSIS — Z780 Asymptomatic menopausal state: Secondary | ICD-10-CM | POA: Diagnosis not present

## 2021-08-29 DIAGNOSIS — Z1231 Encounter for screening mammogram for malignant neoplasm of breast: Secondary | ICD-10-CM

## 2021-08-29 DIAGNOSIS — M85852 Other specified disorders of bone density and structure, left thigh: Secondary | ICD-10-CM | POA: Diagnosis not present

## 2021-08-29 NOTE — Chronic Care Management (AMB) (Signed)
Chronic Care Management Pharmacy Note  08/29/2021 Name:  AVERY EUSTICE MRN:  [id number] DOB:  1951-03-07   Subjective: [name] is an 70 y.o. year old female who is a primary patient of Burnard Hawthorne, FNP.  The CCM team was consulted for assistance with disease management and care coordination needs.    Care coordination for  medication access  in response to provider referral for pharmacy case management and/or care coordination services.   Consent to Services:  The patient was given information about Chronic Care Management services, agreed to services, and gave verbal consent prior to initiation of services.  Please see initial visit note for detailed documentation.   Patient Care Team: Burnard Hawthorne, FNP as PCP - General (Family Medicine) End, Harrell Gave, MD as PCP - Cardiology (Cardiology) De Hollingshead, RPH-CPP (Pharmacist)  Objective:  Lab Results  Component Value Date   CREATININE 0.80 04/10/2021   CREATININE 0.68 01/09/2021   CREATININE 0.54 12/27/2020    Lab Results  Component Value Date   HGBA1C 7.2 (H) 08/15/2021   Last diabetic Eye exam:  Lab Results  Component Value Date/Time   HMDIABEYEEXA No Retinopathy 01/26/2020 12:00 AM    Last diabetic Foot exam:  Lab Results  Component Value Date/Time   HMDIABFOOTEX Normal 06/07/2015 12:00 AM        Component Value Date/Time   CHOL 119 08/15/2021 0805   CHOL 105 07/12/2020 1451   CHOL 102 06/28/2013 0711   TRIG 88.0 08/15/2021 0805   TRIG 81 06/28/2013 0711   HDL 43.30 08/15/2021 0805   HDL 41 07/12/2020 1451   HDL 44 06/28/2013 0711   CHOLHDL 3 08/15/2021 0805   VLDL 17.6 08/15/2021 0805   VLDL 16 06/28/2013 0711   LDLCALC 58 08/15/2021 0805   LDLCALC 49 07/12/2020 1451   LDLCALC 42 06/28/2013 0711    Hepatic Function Latest Ref Rng & Units 04/10/2021 12/27/2020 09/12/2020  Total Protein 6.0 - 8.3 g/dL 7.6 7.0 8.3(H)  Albumin 3.5 - 5.2 g/dL 4.2 4.3 4.0  AST 0 - 37 U/L _0 ALT 0 - 35 U/L 32 16 21  Alk Phosphatase 39 - 117 U/L 45 41 38  Total Bilirubin 0.2 - 1.2 mg/dL 0.8 1.0 1.0    Lab Results  Component Value Date/Time   TSH 1.290 05/10/2020 03:39 PM   TSH 0.87 10/12/2019 09:37 AM    CBC Latest Ref Rng & Units 06/05/2021 04/10/2021 12/27/2020  WBC 4.0 - 10.5 K/uL 7.5 6.9 5.7  Hemoglobin 12.0 - 15.0 g/dL 14.1 14.1 13.6  Hematocrit 36.0 - 46.0 % 42.3 42.0 41.1  Platelets 150.0 - 400.0 K/uL 257.0 328.0 251.0    Lab Results  Component Value Date/Time   VD25OH 71.97 08/15/2021 08:05 AM   VD25OH 36.9 05/10/2020 03:46 PM   VD25OH 44.31 10/12/2019 09:37 AM    Clinical ASCVD: No  The ASCVD Risk score Mikey Bussing DC Jr., et al., 2013) failed to calculate for the following reasons:   The valid total cholesterol range is 130 to 320 mg/dL      Social History   Tobacco Use  Smoking Status Never  Smokeless Tobacco Never   BP Readings from Last 3 Encounters:  08/06/21 114/60  06/21/21 98/60  04/10/21 104/62   Pulse Readings from Last 3 Encounters:  08/06/21 86  06/21/21 79  04/10/21 73   Wt Readings from Last 3 Encounters:  08/06/21 191 lb 6.4 oz (86.8 kg)  06/21/21  191 lb 3.2 oz (86.7 kg)  04/10/21 200 lb 3.2 oz (90.8 kg)    Assessment: Review of patient past medical history, allergies, medications, health status, including review of consultants reports, laboratory and other test data, was performed as part of comprehensive evaluation and provision of chronic care management services.   SDOH:  (Social Determinants of Health) assessments and interventions performed:    CCM Care Plan  Allergies  Allergen Reactions   Sulfa Antibiotics Hives, Shortness Of Breath and Rash   Lisinopril Cough   Other Other (See Comments) and Nausea And Vomiting   Ramipril Cough    Medications Reviewed Today     Reviewed by Burnard Hawthorne, FNP (Family Nurse Practitioner) on 08/06/21 at 1340  Med List Status: <None>   Medication Order Taking? Sig  Documenting Provider Last Dose Status Informant  albuterol (PROVENTIL HFA;VENTOLIN HFA) 108 (90 Base) MCG/ACT inhaler [id number] Yes Inhale 2 puffs into the lungs every 6 (six) hours as needed for wheezing or shortness of breath. [provider] Taking Active   atorvastatin (LIPITOR) 20 MG tablet [id number] Yes Take 1 tablet (20 mg total) by mouth at bedtime. Burnard Hawthorne, FNP Taking Active   Budeson-Glycopyrrol-Formoterol (BREZTRI AEROSPHERE) 160-9-4.8 MCG/ACT Hollie Salk [id number] Yes Inhale 2 puffs into the lungs in the morning and at bedtime. Burnard Hawthorne, FNP Taking Active   Cholecalciferol (VITAMIN D) 125 MCG (5000 UT) CAPS [id number] Yes Take 5,000 Units by mouth daily. [provider] Taking Active   COVID-19 mRNA Vac-TriS, Pfizer, (PFIZER-BIONT COVID-19 VAC-TRIS) SUSP injection [id number] Yes Inject into the muscle. Carlyle Basques, MD Taking Active   dapagliflozin propanediol (FARXIGA) 10 MG TABS tablet [id number] Yes Take 1 tablet (10 mg total) by mouth daily before breakfast. Burnard Hawthorne, FNP Taking Active            Med Note Nat Christen Jun 07, 2021  9:49 AM) Finishing supply of Jardiance 25 mg daily then will start Farxiga   docusate sodium (COLACE) 100 MG capsule [id number] Yes Take 100 mg by mouth at bedtime.  [provider] Taking Active Self  glucose blood (FREESTYLE LITE) test strip [id number] Yes 1 each by Other route as needed for other. Use as instructed [provider] Taking Active Self  loratadine (CLARITIN) 10 MG tablet [id number] Yes Take 10 mg by mouth at bedtime.  [provider] Taking Active Self  metFORMIN (GLUCOPHAGE) 1000 MG tablet [id number] Yes Take 1 tablet (1,000 mg total) by mouth 2 (two) times daily with a meal. Burnard Hawthorne, FNP Taking Active   montelukast (SINGULAIR) 10 MG tablet [id number] Yes TAKE 1 TABLET BY MOUTH AT BEDTIME Burnard Hawthorne, FNP Taking Active   Multiple Vitamin  (MULTIVITAMIN WITH MINERALS) TABS tablet [id number] Yes Take 1 tablet by mouth daily. [provider] Taking Active   omeprazole (PRILOSEC) 20 MG capsule [id number] Yes Take 1 capsule (20 mg total) by mouth daily before breakfast. Burnard Hawthorne, FNP Taking Active   Semaglutide, 1 MG/DOSE, (OZEMPIC, 1 MG/DOSE,) 2 MG/1.5ML SOPN [id number] Yes Inject 1 mg into the skin once a week. Burnard Hawthorne, FNP Taking Active   telmisartan-hydrochlorothiazide (MICARDIS HCT) 80-25 MG tablet [id number] Yes Take 1 tablet by mouth daily. Burnard Hawthorne, FNP Taking Active   venlafaxine XR (EFFEXOR-XR) 150 MG 24 hr capsule [id number] Yes TAKE 1 CAPSULE BY MOUTH DAILY WITH BREAKFAST Burnard Hawthorne, FNP Taking Active   Verapamil HCl CR 300 MG CP24 [id number] Yes  TAKE 1 CAPSULE BY MOUTH DAILY Arnett, Yvetta Coder, FNP Taking Active   vitamin B-12 (CYANOCOBALAMIN) 1000 MCG tablet [id number] Yes Take 1,000 mcg by mouth at bedtime. [provider] Taking Active             Patient Active Problem List   Diagnosis Date Noted   Lower respiratory tract infection 09/21/2020   URI (upper respiratory infection) 08/30/2020   Lower respiratory tract infection due to 2019 novel coronavirus 08/2020   UTI (urinary tract infection)    Hyperlipidemia associated with type 2 diabetes mellitus ([place]) 07/14/2020   Low serum vitamin B12 07/14/2020   Shortness of breath 05/05/2020   Dysuria 03/22/2020   Left foot pain 09/01/2019   H/O adenomatous polyp of colon 03/03/2019   Total knee replacement status 03/03/2019   Depression, major, single episode, complete remission ([place]) 11/18/2018   HNP (herniated nucleus pulposus), lumbar 02/06/2018   Fatty liver disease, nonalcoholic 64/40/3474   GERD (gastroesophageal reflux disease) 10/15/2017   PVC's (premature ventricular contractions) 08/01/2017   OSA (obstructive sleep apnea) 04/21/2017   Vertigo 03/26/2017   Elevated liver enzymes 12/03/2016   Cough  06/05/2016   Irregular heart rate 06/05/2016   Vitamin D deficiency 12/02/2014   IDA (iron deficiency anemia) 10/20/2014   Routine general medical examination at a health care facility 05/03/2013   Diabetes mellitus ([place]) 07/09/2012   Essential hypertension 07/09/2012   Hyperlipidemia LDL goal <70 07/09/2012   Asthma 07/09/2012   Class 1 obesity due to excess calories with body mass index (BMI) of 33.0 to 33.9 in adult 07/09/2012    Immunization History  Administered Date(s) Administered   Fluad Quad(high Dose 65+) 10/02/2019   Influenza-Unspecified 09/02/2014, 10/02/2015, 09/28/2020   MMR 07/09/2010   PFIZER Comirnaty(Gray Top)Covid-19 Tri-Sucrose Vaccine 06/01/2021   PFIZER(Purple Top)SARS-COV-2 Vaccination 12/02/2019, 01/04/2020, 12/14/2020   Pneumococcal Conjugate-13 12/02/2014   Pneumococcal Polysaccharide-23 07/09/2001, 12/04/2016   Td 07/09/2010   Tdap 08/22/2021   Zoster, Live 10/27/2013    Conditions to be addressed/monitored: HTN, HLD, and DMII  Care Plan : Medication Management  Updates made by De Hollingshead, RPH-CPP since 08/29/2021 12:00 AM     Problem: Diabetes, Hypertension, Hyperlipidemia, Pulmonary Disease      Long-Range Goal: Disease Progression Prevention   Start Date: 04/27/2021  Recent Progress: On track  Priority: High  Note:   Current Barriers:  Unable to independently afford treatment regimen  Pharmacist Clinical Goal(s):  Over the next 90 days, patient will verbalize ability to afford treatment regimen Over the next 90 days, patient will achieve control of diabetes as evidenced by A1c through collaboration with PharmD and provider.   Interventions: 1:1 collaboration with Burnard Hawthorne, FNP regarding development and update of comprehensive plan of care as evidenced by provider attestation and co-signature Inter-disciplinary care team collaboration (see longitudinal plan of care) Comprehensive medication review performed; medication  list updated in electronic medical record  Health Maintenance: Has yearly eye exam scheduled later this year.  Reports that she has received Shingrix series, but this is not documented. Previously investigated - she said she had received at [facility], though they did not have Shingrix documented and nothing was in [place] for Shingrix. Previously discussed Tdap vaccination. Encouraged  Diabetes: Uncontrolled but improved; current treatment: metformin 1000 mg BID, Farxiga 10 mg daily, Ozempic 1 mg weekly - provider increased Ozempic to 2 mg weekly.  Patient assistance through [place] through 12/29/21  [employer] to follow up on  refill request for Ozempic 2 mg. Order processed 08/22/21. Should arrive to our office within 10-14 business days, but may be longer due to back order.   Hypertension: Controlled per home report; current treatment: telmisartan/HCTZ 80/25, verapamil 300 mg; follows w/ Dr. Fletcher Anon Previously recommended to continue current regimen at this time along with cardiology collaboration.   Hyperlipidemia: Controlled per last lipid panel; current treatment: atorvastatin 20 mg Previously recommended to continue current regimen along with cardiology collaboration.   Asthma: Appropriately managed; current treatment: Breztri 160/9/4.8 mcg 2 puffs twice daily (changed from Trelegy d/t cost concerns), montelukast 10 mg daily, loratadine 10 mg daily, albuterol HFA PRN - has not needed recently since starting Breztri Previously recommended to continue current regimen along with follow up with Dr. Patsey Berthold.   Depression/Anxiety: Controlled per patient report; current regimen: venlafaxine XR 150 mg daily  Previously recommended to continue current regimen at this time.   GERD: Controlled per patient report; current regimen: omeprazole 20 mg daily Discussed hx omeprazole tx. Reports that after last endoscopy, GI recommended to continue PPI  therapy.  Previously recommended to continue current regimen along with avoidance of trigger foods.   Supplements: Vitamin B12, colace PRN  Patient Goals/Self-Care Activities Over the next 90 days, patient will:  - take medications as prescribed check glucose daily, document, and provide at future appointments collaborate with provider on medication access solutions  Follow Up Plan: Telephone follow up appointment with care management team member scheduled for: ~ 4 weeks as previously scheduled     Medication Assistance:  Ozempic obtained through [place] medication assistance program.  Enrollment ends 11/28/21  Patient's preferred pharmacy is:  [id number] [place], [id number]: [phone]: [phone] Follow Up:  Patient agrees to Care Plan and Follow-up.  Plan: Telephone follow up appointment with care management team member scheduled for:  ~ 4 weeks as previously scheduled  Catie TDarnelle Maffucci PharmD, [place] CBlaineClinical Pharmacist LO[place] [phone]

## 2021-08-29 NOTE — Patient Instructions (Signed)
Visit Information  PATIENT GOALS:  Goals Addressed               This Visit's Progress     Patient Stated     Medication Monitoring (pt-stated)        Patient Goals/Self-Care Activities Over the next 90 days, patient will:  - take medications as prescribed check glucose daily, document, and provide at future appointments collaborate with provider on medication access solutions        Patient verbalizes understanding of instructions provided today and agrees to view in [place].    Plan: Telephone follow up appointment with care management team member scheduled for:  ~ 4 weeks as previously scheduled  Catie Darnelle Maffucci, PharmD, [place], [place] Clinical Pharmacist [employer] at [employer] [phone]

## 2021-08-30 ENCOUNTER — Other Ambulatory Visit: Payer: Self-pay

## 2021-08-30 MED ORDER — ZOSTER VAC RECOMB ADJUVANTED 50 MCG/0.5ML IM SUSR
INTRAMUSCULAR | 1 refills | Status: DC
  Filled 2021-08-30: qty 0.5, 1d supply, fill #0

## 2021-08-31 ENCOUNTER — Encounter: Payer: Self-pay | Admitting: Family

## 2021-08-31 DIAGNOSIS — M8580 Other specified disorders of bone density and structure, unspecified site: Secondary | ICD-10-CM | POA: Insufficient documentation

## 2021-09-11 DIAGNOSIS — Z96653 Presence of artificial knee joint, bilateral: Secondary | ICD-10-CM | POA: Diagnosis not present

## 2021-09-12 ENCOUNTER — Telehealth: Payer: Medicare HMO

## 2021-09-14 ENCOUNTER — Other Ambulatory Visit: Payer: Self-pay

## 2021-09-14 ENCOUNTER — Encounter: Payer: Self-pay | Admitting: Obstetrics and Gynecology

## 2021-09-14 ENCOUNTER — Ambulatory Visit: Payer: Medicare HMO | Admitting: Obstetrics and Gynecology

## 2021-09-14 VITALS — BP 102/63 | HR 76 | Ht 63.0 in | Wt 191.0 lb

## 2021-09-14 DIAGNOSIS — N812 Incomplete uterovaginal prolapse: Secondary | ICD-10-CM

## 2021-09-14 DIAGNOSIS — N816 Rectocele: Secondary | ICD-10-CM

## 2021-09-14 DIAGNOSIS — R350 Frequency of micturition: Secondary | ICD-10-CM

## 2021-09-14 DIAGNOSIS — N8110 Cystocele, unspecified: Secondary | ICD-10-CM

## 2021-09-14 DIAGNOSIS — N390 Urinary tract infection, site not specified: Secondary | ICD-10-CM | POA: Diagnosis not present

## 2021-09-14 DIAGNOSIS — R159 Full incontinence of feces: Secondary | ICD-10-CM

## 2021-09-14 LAB — POCT URINALYSIS DIPSTICK
Appearance: ABNORMAL
Bilirubin, UA: NEGATIVE
Glucose, UA: POSITIVE — AB
Ketones, UA: NEGATIVE
Leukocytes, UA: NEGATIVE
Nitrite, UA: POSITIVE
Protein, UA: NEGATIVE
Spec Grav, UA: 1.02 (ref 1.010–1.025)
Urobilinogen, UA: 0.2 E.U./dL
pH, UA: 5.5 (ref 5.0–8.0)

## 2021-09-14 MED ORDER — ESTRADIOL 0.1 MG/GM VA CREA: 0.5000 g | TOPICAL_CREAM | VAGINAL | 11 refills | Status: DC

## 2021-09-14 NOTE — Patient Instructions (Addendum)
Start vaginal estrogen therapy nightly for two weeks then 2 times weekly at night for treatment of vaginal atrophy (dryness of the vaginal tissues).  Please let us know if the prescription is too expensive and we can look for alternative options.   Take cranberry over the counter for UTI prevention.   Accidental Bowel Leakage: Our goal is to achieve formed bowel movements daily or every-other-day without leakage.  You may need to try different combinations of the following options to find what works best for you.  Some management options include: Dietary changes (more leafy greens, vegetables and fruits; less processed foods) Fiber supplementation (Metamucil or something with psyllium as active ingredient) Over-the-counter imodium (tablets or liquid) to help solidify the stool and prevent leakage of stool.

## 2021-09-14 NOTE — Progress Notes (Signed)
[facility] New Patient Evaluation and Consultation  Referring Provider: McLean-Scocuzza, Olivia Mackie * PCP: Burnard Hawthorne, FNP Date of Service: 09/14/2021  SUBJECTIVE Chief Complaint: New Patient (Initial Visit) (Vanessa Higgins is a 70 y.o. female complains of frequent UTI.)  History of Present Illness: Vanessa Higgins is a 70 y.o. White or Caucasian female seen in consultation at the request of Dr. Terese Door for evaluation of frequent UTIs.    Review of records from Dr Aundra Dubin- Jacklynn Lewis significant for: Sometimes gets burning and pressure sensation. Treated last in April for UTI.   Urine cultures:  04/04/21- >100,000 Klebsiella pneumoniae 07/19/20- >100,000 Kluyuvera cryocrescens 06/07/20- >100,000 E.Coli  Urinary Symptoms: Does not leak urine.   Day time voids 5-6.  Nocturia: 1-2 times per night to void. Voiding dysfunction: she empties her bladder well.  does not use a catheter to empty bladder.  When urinating, she feels she has no difficulties  UTIs: 4 UTI's in the last year.  Has had other virtual visits without culture. Sometimes does not have any symptoms. Other times has pressure and burning, and increased frequency. Has not had a UTI since April.  Has taken cranberry tablets but not every day.  Denies history of blood in urine, kidney or bladder stones, and pyelonephritis   Pelvic Organ Prolapse Symptoms:                  She Admits to a feeling of a bulge the vaginal area. It has been present for many years This bulge is not bothersome.  Bowel Symptom: Bowel movements: 1 time(s) per day Stool consistency: loose Straining: no.  Splinting: no.  Incomplete evacuation: yes.  She Admits to accidental bowel leakage / fecal incontinence  Occurs: 2-3 time(s) per day  Consistency with leakage: liquid Bowel regimen: none Last colonoscopy: Date 120/2020, Results- negative  Sexual Function Sexually active: no.    Pelvic Pain Denies pelvic  pain    Past Medical History:  Past Medical History:  Diagnosis Date   Allergy    hay fever   Anemia    Ankle fracture, left 04/08/2014   Anxiety and depression    Arthritis    Asthma    well controlled    Blood transfusion abn reaction or complication, no procedure mishap    Blood transfusion without reported diagnosis    COVID-19 virus infection 08/2020   Depression    Diabetes mellitus    Disc displacement, lumbar    Dysrhythmia    pvc's   Fatty liver    GERD (gastroesophageal reflux disease)    Headache(784.0)    History of hiatal hernia    small   Hyperlipidemia    Hypertension    Joint pain    Low vitamin B12 level    Low vitamin D level    Migraine    Mitral valve prolapse    Obstructive sleep apnea    CPAP   Osteoarthritis    Palpitations    PONV (postoperative nausea and vomiting)    h/o in the past   Sleep apnea 04/06/2017   uses CPAP   SOB (shortness of breath)    UTI (lower urinary tract infection)    Vitamin B 12 deficiency    Vitamin D deficiency    Wears glasses      Past Surgical History:   Past Surgical History:  Procedure Laterality Date   APPENDECTOMY  1977   COLONOSCOPY  last 11/17/2014   INGUINAL HERNIA REPAIR Right 11/01/2020  Procedure: HERNIA REPAIR INGUINAL ADULT;  Surgeon: Robert Bellow, MD;  Location: [facility];  Service: General;  Laterality: Right;   JOINT REPLACEMENT     KNEE ARTHROPLASTY Right 03/03/2019   Procedure: COMPUTER ASSISTED TOTAL KNEE ARTHROPLASTY-RIGHT;  Surgeon: Dereck Leep, MD;  Location: [facility];  Service: Orthopedics;  Laterality: Right;   LUMBAR LAMINECTOMY/DECOMPRESSION MICRODISCECTOMY Right 02/06/2018   Procedure: Lumbar two Hemilaminectomy for Discectomy;  Surgeon: Ashok Pall, MD;  Location: [facility];  Service: Neurosurgery;  Laterality: Right;   ORIF ANKLE FRACTURE Left 04/08/2014   Procedure: OPEN REDUCTION INTERNAL FIXATION (ORIF) ANKLE FRACTURE LEFT ANKLE FRACTURE OPEN TREATMENT BIMALLEOLAR ANKLE  INCLUDES INTERNAL FIXATION;  Surgeon: Johnny Bridge, MD;  Location: [facility];  Service: Orthopedics;  Laterality: Left;   PAROTIDECTOMY  1980   left   POLYPECTOMY     TONSILLECTOMY AND ADENOIDECTOMY     as a child   TOTAL KNEE ARTHROPLASTY Bilateral    left   TUBAL LIGATION  05/1976     Past OB/GYN History: OB History  Gravida Para Term Preterm AB Living  2 2          SAB IAB Ectopic Multiple Live Births          2    # Outcome Date GA Lbr Len/2nd Weight Sex Delivery Anes PTL Lv  2 Para           1 Para            Menopausal: Yes, Denies vaginal bleeding since menopause   Medications: She has a current medication list which includes the following prescription(s): albuterol, atorvastatin, breztri aerosphere, vitamin d, pfizer-biont covid-19 vac-tris, dapagliflozin propanediol, [START ON 09/17/2021] estradiol, onetouch verio, loratadine, metformin, montelukast, multivitamin with minerals, omeprazole, semaglutide (2 mg/dose), telmisartan-hydrochlorothiazide, venlafaxine xr, verapamil hcl cr, vitamin b-12, and zoster vaccine adjuvanted.   Allergies: Patient is allergic to sulfa antibiotics, lisinopril, other, and ramipril.   Social History:  Social History   Tobacco Use   Smoking status: Never   Smokeless tobacco: Never  Vaping Use   Vaping Use: Never used  Substance Use Topics   Alcohol use: No   Drug use: No    Relationship status: widowed She lives with daughter and grandchildren.   She is employed as an Therapist, sports at [employer]. Regular exercise: No History of abuse: No  Family History:   Family History  Problem Relation Age of Onset   Sudden death Mother    Cancer Mother 2       pancreatic   Heart disease Father 26   Sudden death Father    Cancer Brother        colon   Colon cancer Brother 31   Stroke Maternal Grandmother    Heart disease Maternal Grandfather    Cancer Sister        brain   Breast cancer Neg Hx    Colon polyps Neg Hx     Esophageal cancer Neg Hx    Rectal cancer Neg Hx    Stomach cancer Neg Hx    Thyroid cancer Neg Hx      Review of Systems: Review of Systems  Constitutional:  Negative for fever, malaise/fatigue and weight loss.  Respiratory:  Negative for cough, shortness of breath and wheezing.   Cardiovascular:  Negative for chest pain, palpitations and leg swelling.  Gastrointestinal:  Negative for abdominal pain and blood in stool.  Genitourinary:  Negative for dysuria.  Musculoskeletal:  Negative for  myalgias.  Skin:  Negative for rash.  Neurological:  Negative for dizziness and headaches.  Endo/Heme/Allergies:  Does not bruise/bleed easily.  Psychiatric/Behavioral:  Negative for depression. The patient is not nervous/anxious.     OBJECTIVE Physical Exam: Vitals:   09/14/21 0856  BP: 102/63  Pulse: 76  Weight: 191 lb (86.6 kg)  Height: '5\' 3"'$  (1.6 m)    Physical Exam Constitutional:      General: She is not in acute distress. Pulmonary:     Effort: Pulmonary effort is normal.  Abdominal:     General: There is no distension.     Palpations: Abdomen is soft.     Tenderness: There is no abdominal tenderness. There is no rebound.  Musculoskeletal:        General: No swelling. Normal range of motion.  Skin:    General: Skin is warm and dry.     Findings: No rash.  Neurological:     Mental Status: She is alert and oriented to person, place, and time.  Psychiatric:        Mood and Affect: Mood normal.        Behavior: Behavior normal.     GU / Detailed Urogynecologic Evaluation:  Pelvic Exam: Normal external female genitalia; Bartholin's and Skene's glands normal in appearance; urethral meatus normal in appearance, no urethral masses or discharge.   CST: negative  Speculum exam reveals normal vaginal mucosa with atrophy. Cervix normal appearance. Uterus normal single, nontender. Adnexa no mass, fullness, tenderness.    Pelvic floor strength II/V, puborectalis II/V external  anal sphincter III/V  Pelvic floor musculature: Right levator non-tender, Right obturator non-tender, Left levator non-tender, Left obturator non-tender  POP-Q:   POP-Q  0                                            Aa   0                                           Ba  -5.5                                              C   4                                            Gh  2.5                                            Pb  9                                            tvl   0.5  Ap  0.5                                            Bp  -8.5                                              D     Rectal Exam:  Normal external rectum  Post-Void Residual (PVR) by Bladder Scan: In order to evaluate bladder emptying, we discussed obtaining a postvoid residual and she agreed to this procedure.  Procedure: The ultrasound unit was placed on the patient's abdomen in the suprapubic region after the patient had voided. A PVR of 55 ml was obtained by bladder scan.  Laboratory Results: POC urine: trace blood, positive nitrites, negative leukocytes   ASSESSMENT AND PLAN Ms. Leventis is a 70 y.o. with:  1. Recurrent UTI   2. Urinary frequency   3. Incontinence of feces, unspecified fecal incontinence type   4. Prolapse of anterior vaginal wall   5. Prolapse of posterior vaginal wall   6. Uterovaginal prolapse, incomplete    rUTI - For treatment of recurrent urinary tract infections, we discussed management of recurrent UTIs including prophylaxis with a daily low dose antibiotic, transvaginal estrogen therapy, D-mannose, and cranberry supplements.   - Prescribed estrace cream 0.5g nightly for two weeks then twice a week after. She will also take OTC cranberry supplements.  - Urine today has some nitrites, will send for UA and culture, although she is asymptomatic currently.  - If she does have UTI symptoms, then she should obtain a urine culture in  order to track infections.   2. Fecal incontinence - Treatment options include anti-diarrhea medication (loperamide/ Imodium OTC or prescription lomotil), fiber supplements, physical therapy, and possible sacral neuromodulation or surgery.   - She will start with physical therapy, fiber supplementation and imodium as needed - She will also contact her GI to discuss recent change in bowel consistency  3. Stage II anterior, Stage II posterior, Stage I apical prolapse - For treatment of pelvic organ prolapse, we discussed options for management including expectant management, conservative management, and surgical management, such as Kegels, a pessary, pelvic floor physical therapy, and specific surgical procedures. - She is not currently bothered by her prolapse and prefers expectant management  Return 3 months or sooner if needed  Jaquita Folds, MD   Medical Decision Making:  - Reviewed/ ordered a clinical laboratory test - Review and summation of prior records

## 2021-09-15 LAB — URINALYSIS, MICROSCOPIC ONLY: Casts: NONE SEEN /lpf

## 2021-09-19 ENCOUNTER — Telehealth: Payer: Medicare HMO

## 2021-09-21 ENCOUNTER — Other Ambulatory Visit: Payer: Self-pay | Admitting: Family

## 2021-09-23 LAB — URINE CULTURE

## 2021-09-25 ENCOUNTER — Telehealth: Payer: Self-pay | Admitting: Family

## 2021-09-25 NOTE — Telephone Encounter (Signed)
Per the patient she will need more Ozempic after this week 09/25/21

## 2021-09-26 ENCOUNTER — Ambulatory Visit: Payer: Medicare HMO | Admitting: Pharmacist

## 2021-09-26 DIAGNOSIS — E1169 Type 2 diabetes mellitus with other specified complication: Secondary | ICD-10-CM

## 2021-09-26 DIAGNOSIS — I10 Essential (primary) hypertension: Secondary | ICD-10-CM

## 2021-09-26 DIAGNOSIS — E785 Hyperlipidemia, unspecified: Secondary | ICD-10-CM

## 2021-09-26 NOTE — Telephone Encounter (Signed)
**Note Vanessa-Identified via Obfuscation** See CCM documentation. Left voicemail for patient on cell and home phone to come pick up sample at her earliest convenience.  Medication Samples have been labeled and logged for the patient.  Drug name: Ozempic       Strength: 8 mg/3 mL        Qty: 1 pen  LOT: [id number]  Exp.Date: 06/28/2022  Dosing instructions: Inject 2 mg weekly  The patient has been instructed regarding the correct time, dose, and frequency of taking this medication, including desired effects and most common side effects.   Vanessa Higgins 8:54 AM 09/26/2021

## 2021-09-26 NOTE — Chronic Care Management (AMB) (Signed)
Chronic Care Management Pharmacy Note  09/26/2021 Name:  Vanessa Higgins MRN:  [id number] DOB:  1951-03-28  Subjective: Vanessa Higgins is an 70 y.o. year old female who is a primary patient of Burnard Hawthorne, FNP.  The CCM team was consulted for assistance with disease management and care coordination needs.    Engaged with patient by telephone for  medication access  in response to provider referral for pharmacy case management and/or care coordination services.   Consent to Services:  The patient was given information about Chronic Care Management services, agreed to services, and gave verbal consent prior to initiation of services.  Please see initial visit note for detailed documentation.   Patient Care Team: Burnard Hawthorne, FNP as PCP - General (Family Medicine) End, Harrell Gave, MD as PCP - Cardiology (Cardiology) De Hollingshead, RPH-CPP (Pharmacist)   Objective:  Lab Results  Component Value Date   CREATININE 0.80 04/10/2021   CREATININE 0.68 01/09/2021   CREATININE 0.54 12/27/2020    Lab Results  Component Value Date   HGBA1C 7.2 (H) 08/15/2021   Last diabetic Eye exam:  Lab Results  Component Value Date/Time   HMDIABEYEEXA No Retinopathy 01/26/2020 12:00 AM    Last diabetic Foot exam:  Lab Results  Component Value Date/Time   HMDIABFOOTEX Normal 06/07/2015 12:00 AM        Component Value Date/Time   CHOL 119 08/15/2021 0805   CHOL 105 07/12/2020 1451   CHOL 102 06/28/2013 0711   TRIG 88.0 08/15/2021 0805   TRIG 81 06/28/2013 0711   HDL 43.30 08/15/2021 0805   HDL 41 07/12/2020 1451   HDL 44 06/28/2013 0711   CHOLHDL 3 08/15/2021 0805   VLDL 17.6 08/15/2021 0805   VLDL 16 06/28/2013 0711   LDLCALC 58 08/15/2021 0805   LDLCALC 49 07/12/2020 1451   LDLCALC 42 06/28/2013 0711    Hepatic Function Latest Ref Rng & Units 04/10/2021 12/27/2020 09/12/2020  Total Protein 6.0 - 8.3 g/dL 7.6 7.0 8.3(H)  Albumin 3.5 - 5.2 g/dL 4.2 4.3 4.0  AST  0 - 37 U/L _0 ALT 0 - 35 U/L 32 16 21  Alk Phosphatase 39 - 117 U/L 45 41 38  Total Bilirubin 0.2 - 1.2 mg/dL 0.8 1.0 1.0    Lab Results  Component Value Date/Time   TSH 1.290 05/10/2020 03:39 PM   TSH 0.87 10/12/2019 09:37 AM    CBC Latest Ref Rng & Units 06/05/2021 04/10/2021 12/27/2020  WBC 4.0 - 10.5 K/uL 7.5 6.9 5.7  Hemoglobin 12.0 - 15.0 g/dL 14.1 14.1 13.6  Hematocrit 36.0 - 46.0 % 42.3 42.0 41.1  Platelets 150.0 - 400.0 K/uL 257.0 328.0 251.0    Lab Results  Component Value Date/Time   VD25OH 71.97 08/15/2021 08:05 AM   VD25OH 36.9 05/10/2020 03:46 PM   VD25OH 44.31 10/12/2019 09:37 AM     Social History   Tobacco Use  Smoking Status Never  Smokeless Tobacco Never   BP Readings from Last 3 Encounters:  09/14/21 102/63  08/06/21 114/60  06/21/21 98/60   Pulse Readings from Last 3 Encounters:  09/14/21 76  08/06/21 86  06/21/21 79   Wt Readings from Last 3 Encounters:  09/14/21 191 lb (86.6 kg)  08/06/21 191 lb 6.4 oz (86.8 kg)  06/21/21 191 lb 3.2 oz (86.7 kg)    Assessment: Review of patient past medical history, allergies, medications, health status, including review of consultants reports, laboratory and other  test data, was performed as part of comprehensive evaluation and provision of chronic care management services.   SDOH:  (Social Determinants of Health) assessments and interventions performed:    CCM Care Plan  Allergies  Allergen Reactions   Sulfa Antibiotics Hives, Shortness Of Breath and Rash   Lisinopril Cough   Other Other (See Comments) and Nausea And Vomiting   Ramipril Cough    Medications Reviewed Today     Reviewed by Jaquita Folds, MD (Physician) on 09/14/21 at [place] List Status: <None>   Medication Order Taking? Sig Documenting Provider Last Dose Status Informant  albuterol (PROVENTIL HFA;VENTOLIN HFA) 108 (90 Base) MCG/ACT inhaler [id number] Yes Inhale 2 puffs into the lungs every 6 (six) hours as  needed for wheezing or shortness of breath. [provider] Taking Active   atorvastatin (LIPITOR) 20 MG tablet [id number] Yes Take 1 tablet (20 mg total) by mouth at bedtime. Burnard Hawthorne, FNP Taking Active   Budeson-Glycopyrrol-Formoterol (BREZTRI AEROSPHERE) 160-9-4.8 MCG/ACT Hollie Salk [id number] Yes Inhale 2 puffs into the lungs in the morning and at bedtime. Burnard Hawthorne, FNP Taking Active   Cholecalciferol (VITAMIN D) 125 MCG (5000 UT) CAPS [id number] Yes Take 5,000 Units by mouth daily. [provider] Taking Active   COVID-19 mRNA Vac-TriS, Pfizer, (PFIZER-BIONT COVID-19 VAC-TRIS) SUSP injection [id number] Yes Inject into the muscle. Carlyle Basques, MD Taking Active   dapagliflozin propanediol (FARXIGA) 10 MG TABS tablet [id number] Yes Take 1 tablet (10 mg total) by mouth daily before breakfast. Burnard Hawthorne, FNP Taking Active            Med Note Nat Christen Jun 07, 2021  9:49 AM) Finishing supply of Jardiance 25 mg daily then will start Farxiga   glucose blood (ONETOUCH VERIO) test strip [id number] Yes 1 each by Other route as needed for other. Burnard Hawthorne, FNP Taking Active   loratadine (CLARITIN) 10 MG tablet [id number] Yes Take 10 mg by mouth at bedtime.  [provider] Taking Active Self  metFORMIN (GLUCOPHAGE) 1000 MG tablet [id number] Yes Take 1 tablet (1,000 mg total) by mouth 2 (two) times daily with a meal. Burnard Hawthorne, FNP Taking Active   montelukast (SINGULAIR) 10 MG tablet [id number] Yes TAKE 1 TABLET BY MOUTH AT BEDTIME Burnard Hawthorne, FNP Taking Active   Multiple Vitamin (MULTIVITAMIN WITH MINERALS) TABS tablet [id number] Yes Take 1 tablet by mouth daily. [provider] Taking Active   omeprazole (PRILOSEC) 20 MG capsule [id number] Yes Take 1 capsule (20 mg total) by mouth daily before breakfast. Burnard Hawthorne, FNP Taking Active   Semaglutide, 2 MG/DOSE, 8 MG/3ML SOPN [id number] Yes Inject 2 mg as  directed once a week. Burnard Hawthorne, FNP Taking Active   telmisartan-hydrochlorothiazide (MICARDIS HCT) 80-25 MG tablet [id number] Yes Take 1 tablet by mouth daily. Burnard Hawthorne, FNP Taking Active   venlafaxine XR (EFFEXOR-XR) 150 MG 24 hr capsule [id number] Yes TAKE 1 CAPSULE BY MOUTH DAILY WITH BREAKFAST Burnard Hawthorne, FNP Taking Active   Verapamil HCl CR 300 MG CP24 [id number] Yes TAKE 1 CAPSULE BY MOUTH DAILY Arnett, Yvetta Coder, FNP Taking Active   vitamin B-12 (CYANOCOBALAMIN) 1000 MCG tablet [id number] Yes Take 1,000 mcg by mouth at bedtime. [provider] Taking Active   Zoster Vaccine Adjuvanted Brand [facility]) injection [id number] Yes Inject into the muscle.  Taking Active             Patient Active Problem List  Diagnosis Date Noted   Osteopenia 08/31/2021   Lower respiratory tract infection 09/21/2020   URI (upper respiratory infection) 08/30/2020   Lower respiratory tract infection due to 2019 novel coronavirus 08/2020   UTI (urinary tract infection)    Hyperlipidemia associated with type 2 diabetes mellitus ([place]) 07/14/2020   Low serum vitamin B12 07/14/2020   Shortness of breath 05/05/2020   Dysuria 03/22/2020   Left foot pain 09/01/2019   H/O adenomatous polyp of colon 03/03/2019   Total knee replacement status 03/03/2019   Depression, major, single episode, complete remission ([place]) 11/18/2018   HNP (herniated nucleus pulposus), lumbar 02/06/2018   Fatty liver disease, nonalcoholic 96/28/3662   GERD (gastroesophageal reflux disease) 10/15/2017   PVC's (premature ventricular contractions) 08/01/2017   OSA (obstructive sleep apnea) 04/21/2017   Vertigo 03/26/2017   Elevated liver enzymes 12/03/2016   Cough 06/05/2016   Irregular heart rate 06/05/2016   Vitamin D deficiency 12/02/2014   IDA (iron deficiency anemia) 10/20/2014   Routine general medical examination at a health care facility 05/03/2013   Diabetes mellitus ([place]) 07/09/2012   Essential  hypertension 07/09/2012   Hyperlipidemia LDL goal <70 07/09/2012   Asthma 07/09/2012   Class 1 obesity due to excess calories with body mass index (BMI) of 33.0 to 33.9 in adult 07/09/2012    Immunization History  Administered Date(s) Administered   Fluad Quad(high Dose 65+) 10/02/2019   Influenza-Unspecified 09/02/2014, 10/02/2015, 09/28/2020   MMR 07/09/2010   PFIZER Comirnaty(Gray Top)Covid-19 Tri-Sucrose Vaccine 06/01/2021   PFIZER(Purple Top)SARS-COV-2 Vaccination 12/02/2019, 01/04/2020, 12/14/2020   Pneumococcal Conjugate-13 12/02/2014   Pneumococcal Polysaccharide-23 07/09/2001, 12/04/2016   Td 07/09/2010   Tdap 08/22/2021   Zoster, Live 10/27/2013    Conditions to be addressed/monitored: HTN, HLD, and DMII  Care Plan : Medication Management  Updates made by De Hollingshead, RPH-CPP since 09/26/2021 12:00 AM     Problem: Diabetes, Hypertension, Hyperlipidemia, Pulmonary Disease      Long-Range Goal: Disease Progression Prevention   Start Date: 04/27/2021  Recent Progress: On track  Priority: High  Note:   Current Barriers:  Unable to independently afford treatment regimen  Pharmacist Clinical Goal(s):  Over the next 90 days, patient will verbalize ability to afford treatment regimen Over the next 90 days, patient will achieve control of diabetes as evidenced by A1c through collaboration with PharmD and provider.   Interventions: 1:1 collaboration with Burnard Hawthorne, FNP regarding development and update of comprehensive plan of care as evidenced by provider attestation and co-signature Inter-disciplinary care team collaboration (see longitudinal plan of care) Comprehensive medication review performed; medication list updated in electronic medical record  Diabetes: Uncontrolled but improved; current treatment: metformin 1000 mg BID, Farxiga 10 mg daily, Ozempic 2 mg weekly Patient assistance through [place] through 12/29/21   Collaborated w/ [facility], patient assistance program is awaiting their shipment of Ozempic 2 mg, should arrive by end of October and should arrive to our office by mid November. Will prepare sample for patient in the meantime.   Hypertension: Controlled per home report; current treatment: telmisartan/HCTZ 80/25, verapamil 300 mg; follows w/ Dr. Fletcher Anon Previously recommended to continue current regimen at this time along with cardiology collaboration.   Hyperlipidemia: Controlled per last lipid panel; current treatment: atorvastatin 20 mg Previously recommended to continue current regimen along with cardiology collaboration.   Asthma: Appropriately managed; current treatment: Breztri 160/9/4.8 mcg 2 puffs twice daily (changed from Trelegy d/t cost concerns), montelukast 10 mg daily, loratadine 10 mg daily,  albuterol HFA PRN - has not needed recently since starting Breztri Previously recommended to continue current regimen along with follow up with Dr. Patsey Berthold.   Depression/Anxiety: Controlled per patient report; current regimen: venlafaxine XR 150 mg daily  Previously recommended to continue current regimen at this time.   GERD: Controlled per patient report; current regimen: omeprazole 20 mg daily Discussed hx omeprazole tx. Reports that after last endoscopy, GI recommended to continue PPI therapy.  Previously recommended to continue current regimen along with avoidance of trigger foods.   Supplements: Vitamin B12, colace PRN  Patient Goals/Self-Care Activities Over the next 90 days, patient will:  - take medications as prescribed check glucose daily, document, and provide at future appointments collaborate with provider on medication access solutions  Follow Up Plan: Telephone follow up appointment with care management team member scheduled for: ~ 4 weeks as previously scheduled     Medication Assistance:  Ozempic obtained through [place] [employer] medication assistance program.   Enrollment ends 11/28/21.   Wilder Glade obtained through [employer] medication assistance program, Enrollment ends 12/29/21  Patient's preferred pharmacy is:  [id number] [place] [id number]: [phone]: [phone]   Follow Up:  Patient agrees to Care Plan and Follow-up.  Plan: Telephone follow up appointment with care management team member scheduled for:  6 weeks  Catie TDarnelle Maffucci PharmD, BVista [place] CParsonsClinical Pharmacist LOcc[facility] [employer] [phone]

## 2021-09-26 NOTE — Patient Instructions (Signed)
Visit Information  PATIENT GOALS:  Goals Addressed               This Visit's Progress     Patient Stated     Medication Monitoring (pt-stated)        Patient Goals/Self-Care Activities Over the next 90 days, patient will:  - take medications as prescribed check glucose daily, document, and provide at future appointments collaborate with provider on medication access solutions         Patient verbalizes understanding of instructions provided today and agrees to view in [place].    Plan: Telephone follow up appointment with care management team member scheduled for:  6 weeks  Catie Darnelle Maffucci, PharmD, [place], [place] Clinical Pharmacist [employer] at [employer] [phone]

## 2021-09-28 ENCOUNTER — Other Ambulatory Visit (HOSPITAL_BASED_OUTPATIENT_CLINIC_OR_DEPARTMENT_OTHER): Payer: Self-pay | Admitting: *Deleted

## 2021-09-28 MED ORDER — NITROFURANTOIN MONOHYD MACRO 100 MG PO CAPS: 100.0000 mg | ORAL_CAPSULE | Freq: Two times a day (BID) | ORAL | 0 refills | Status: AC

## 2021-09-28 NOTE — Progress Notes (Signed)
Pt with positive urine culture. Pt c/o urinary frequency/urgency. Rx for macrobid sent to pharmacy per Dr. Sanjuan Dame request.

## 2021-09-28 NOTE — Telephone Encounter (Signed)
Called patient on cell, left voicemail for her to come pick up sample this morning. Advised that we are remote this afternoon and that she should not by this afternoon

## 2021-10-03 ENCOUNTER — Telehealth: Payer: Self-pay | Admitting: Pharmacist

## 2021-10-03 ENCOUNTER — Telehealth: Payer: Medicare HMO

## 2021-10-03 ENCOUNTER — Telehealth: Payer: Self-pay

## 2021-10-03 NOTE — Telephone Encounter (Signed)
  Chronic Care Management   Note  10/03/2021 Name: Vanessa Higgins MRN: [id number] DOB: 04-Feb-1951   Attempted to contact patient for scheduled appointment for medication management support. Left HIPAA compliant message for patient to return my call at their convenience.    Plan: - If I do not hear back from the patient by end of business today, will collaborate with Care Guide to outreach to schedule follow up with me  Catie Darnelle Maffucci, PharmD, [place], [place] Pharmacist [employer] at [employer] [phone]

## 2021-10-03 NOTE — Telephone Encounter (Signed)
Pt called and let her know that we received her 4 boxes of Ozempic 2mg /88mL pens. She stated that she will pick up & they are labled & placed in fridge.

## 2021-10-04 ENCOUNTER — Other Ambulatory Visit: Payer: Self-pay | Admitting: Family

## 2021-10-10 ENCOUNTER — Other Ambulatory Visit: Payer: Self-pay | Admitting: Family

## 2021-10-10 DIAGNOSIS — D509 Iron deficiency anemia, unspecified: Secondary | ICD-10-CM

## 2021-10-10 DIAGNOSIS — J45909 Unspecified asthma, uncomplicated: Secondary | ICD-10-CM

## 2021-10-29 ENCOUNTER — Telehealth: Payer: Self-pay

## 2021-10-29 NOTE — Telephone Encounter (Signed)
Called and LVM in regards to upcoming covid test on 11/2. Will try again later. Routing to [place] triage for follow up.

## 2021-10-30 ENCOUNTER — Telehealth: Payer: Self-pay

## 2021-10-30 NOTE — Chronic Care Management (AMB) (Signed)
  Care Management   Note  10/30/2021 Name: Vanessa Higgins MRN: [id number] DOB: 27-Jul-1951  Vanessa Higgins is a 70 y.o. year old female who is a primary care patient of Vidal Schwalbe, Yvetta Coder, FNP and is actively engaged with the care management team. I reached out to Vanessa Higgins by phone today to assist with re-scheduling a follow up visit with the Pharmacist  Follow up plan: Unsuccessful telephone outreach attempt made. A HIPAA compliant phone message was left for the patient providing contact information and requesting a return call.  The care management team will reach out to the patient again over the next 7 days.  If patient returns call to provider office, please advise to call [facility]  at [place], [place], [place], [place] [address] Direct Dial: [phone] [email or web address]: [email or web address]

## 2021-10-30 NOTE — Telephone Encounter (Signed)
Lm x2 for patient.  Will close encounter per office protocol.   

## 2021-10-30 NOTE — Telephone Encounter (Signed)
Called and lvm to patient about upcoming covid test on 11/2, routing to [name] triage for follow up.

## 2021-10-31 ENCOUNTER — Other Ambulatory Visit
Admission: RE | Admit: 2021-10-31 | Discharge: 2021-10-31 | Disposition: A | Discharge status: Home / Self Care | Payer: HMO | Source: Ambulatory Visit | Attending: Pulmonary Disease | Admitting: Pulmonary Disease

## 2021-10-31 ENCOUNTER — Other Ambulatory Visit: Payer: Self-pay

## 2021-10-31 DIAGNOSIS — Z20822 Contact with and (suspected) exposure to covid-19: Secondary | ICD-10-CM | POA: Diagnosis not present

## 2021-10-31 DIAGNOSIS — Z01812 Encounter for preprocedural laboratory examination: Secondary | ICD-10-CM | POA: Diagnosis not present

## 2021-10-31 NOTE — Telephone Encounter (Signed)
Lm x2 for patient.  Will close encounter per office protocol.   

## 2021-11-01 ENCOUNTER — Ambulatory Visit: Payer: HMO | Attending: Pulmonary Disease

## 2021-11-01 DIAGNOSIS — Z7951 Long term (current) use of inhaled steroids: Secondary | ICD-10-CM | POA: Insufficient documentation

## 2021-11-01 DIAGNOSIS — J4540 Moderate persistent asthma, uncomplicated: Secondary | ICD-10-CM | POA: Diagnosis not present

## 2021-11-01 LAB — SARS CORONAVIRUS 2 (TAT 6-24 HRS): SARS Coronavirus 2: NEGATIVE

## 2021-11-01 MED ORDER — ALBUTEROL SULFATE (2.5 MG/3ML) 0.083% IN NEBU
2.5000 mg | INHALATION_SOLUTION | Freq: Once | RESPIRATORY_TRACT | Status: AC
  Administered 2021-11-01: 2.5 mg via RESPIRATORY_TRACT
  Filled 2021-11-01: qty 3

## 2021-11-01 NOTE — Telephone Encounter (Signed)
Created in error

## 2021-11-02 LAB — PULMONARY FUNCTION TEST ARMC ONLY
DL/VA % pred: 75 %
DL/VA: 3.13 ml/min/mmHg/L
DLCO unc % pred: 95 %
DLCO unc: 17.9 ml/min/mmHg
FEF 25-75 Post: 0.7 L/sec
FEF 25-75 Pre: 0.51 L/sec
FEF2575-%Change-Post: 36 %
FEF2575-%Pred-Post: 40 %
FEF2575-%Pred-Pre: 29 %
FEV1-%Change-Post: 8 %
FEV1-%Pred-Post: 63 %
FEV1-%Pred-Pre: 59 %
FEV1-Post: 1.35 L
FEV1-Pre: 1.25 L
FEV1FVC-%Change-Post: 0 %
FEV1FVC-%Pred-Pre: 74 %
FEV6-%Change-Post: 9 %
FEV6-%Pred-Post: 90 %
FEV6-%Pred-Pre: 82 %
FEV6-Post: 2.41 L
FEV6-Pre: 2.21 L
FEV6FVC-%Change-Post: 0 %
FEV6FVC-%Pred-Post: 104 %
FEV6FVC-%Pred-Pre: 104 %
FVC-%Change-Post: 8 %
FVC-%Pred-Post: 86 %
FVC-%Pred-Pre: 79 %
FVC-Post: 2.41 L
FVC-Pre: 2.23 L
Post FEV1/FVC ratio: 56 %
Post FEV6/FVC ratio: 100 %
Pre FEV1/FVC ratio: 56 %
Pre FEV6/FVC Ratio: 100 %
RV % pred: 145 %
RV: 3.18 L
TLC % pred: 125 %
TLC: 6.18 L

## 2021-11-06 NOTE — Chronic Care Management (AMB) (Signed)
  Care Management   Note  11/06/2021 Name: Vanessa Higgins MRN: [id number] DOB: Oct 21, 1951  Vanessa Higgins is a 70 y.o. year old female who is a primary care patient of Vidal Schwalbe, Yvetta Coder, FNP and is actively engaged with the care management team. I reached out to Vanessa Higgins by phone today to assist with re-scheduling a follow up visit with the Pharmacist  Follow up plan: Unsuccessful telephone outreach attempt made. A HIPAA compliant phone message was left for the patient providing contact information and requesting a return call.  The care management team will reach out to the patient again over the next 7 days.  If patient returns call to provider office, please advise to call [facility]  at [place], [place], [place], [place] [address] Direct Dial: [phone] [email or web address]: [email or web address]

## 2021-11-08 NOTE — Chronic Care Management (AMB) (Signed)
  Care Management   Note  11/08/2021 Name: ROMAINE MACIOLEK MRN: [id number] DOB: 10-09-1951  [name] is a 70 y.o. year old female who is a primary care patient of Vidal Schwalbe, Yvetta Coder, FNP and is actively engaged with the care management team. I reached out to [name] by phone today to assist with re-scheduling a follow up visit with the Pharmacist  Follow up plan: Telephone appointment with care management team member scheduled for:12/13/2021  Noreene Larsson, [place], [place], [place] [address] Direct Dial: [phone] [email or web address] Website: [email or web address]

## 2021-11-20 ENCOUNTER — Other Ambulatory Visit: Payer: Self-pay

## 2021-11-20 ENCOUNTER — Ambulatory Visit (INDEPENDENT_AMBULATORY_CARE_PROVIDER_SITE_OTHER): Payer: HMO | Admitting: Family

## 2021-11-20 ENCOUNTER — Other Ambulatory Visit: Payer: Self-pay | Admitting: Family

## 2021-11-20 ENCOUNTER — Encounter: Payer: Self-pay | Admitting: Family

## 2021-11-20 VITALS — BP 124/68 | HR 77 | Temp 96.2°F | Ht 62.99 in | Wt 185.6 lb

## 2021-11-20 DIAGNOSIS — Z794 Long term (current) use of insulin: Secondary | ICD-10-CM

## 2021-11-20 DIAGNOSIS — F32A Depression, unspecified: Secondary | ICD-10-CM | POA: Diagnosis not present

## 2021-11-20 DIAGNOSIS — E785 Hyperlipidemia, unspecified: Secondary | ICD-10-CM

## 2021-11-20 DIAGNOSIS — F419 Anxiety disorder, unspecified: Secondary | ICD-10-CM | POA: Diagnosis not present

## 2021-11-20 DIAGNOSIS — E1169 Type 2 diabetes mellitus with other specified complication: Secondary | ICD-10-CM

## 2021-11-20 DIAGNOSIS — I10 Essential (primary) hypertension: Secondary | ICD-10-CM | POA: Diagnosis not present

## 2021-11-20 LAB — POCT GLYCOSYLATED HEMOGLOBIN (HGB A1C): Hemoglobin A1C: 6.5 % — AB (ref 4.0–5.6)

## 2021-11-20 LAB — COMPREHENSIVE METABOLIC PANEL
ALT: 25 U/L (ref 0–35)
AST: 23 U/L (ref 0–37)
Albumin: 4.5 g/dL (ref 3.5–5.2)
Alkaline Phosphatase: 36 U/L — ABNORMAL LOW (ref 39–117)
BUN: 16 mg/dL (ref 6–23)
CO2: 32 mEq/L (ref 19–32)
Calcium: 10 mg/dL (ref 8.4–10.5)
Chloride: 96 mEq/L (ref 96–112)
Creatinine, Ser: 0.71 mg/dL (ref 0.40–1.20)
GFR: 85.02 mL/min (ref >60.00)
Glucose, Bld: 110 mg/dL — ABNORMAL HIGH (ref 70–99)
Potassium: 3.6 mEq/L (ref 3.5–5.1)
Sodium: 140 mEq/L (ref 135–145)
Total Bilirubin: 1.4 mg/dL — ABNORMAL HIGH (ref 0.2–1.2)
Total Protein: 7.5 g/dL (ref 6.0–8.3)

## 2021-11-20 MED ORDER — ONETOUCH VERIO VI STRP: 1.0000 | ORAL_STRIP | 3 refills | Status: DC | PRN

## 2021-11-20 NOTE — Assessment & Plan Note (Signed)
Lab Results  Component Value Date   HGBA1C 6.5 (A) 11/20/2021   Excellent control.  Continue metformin 1000 mg BID, Farxiga 10 mg daily, Ozempic 2 mg weekly.

## 2021-11-20 NOTE — Assessment & Plan Note (Signed)
Excellent control.  Continue atorvastatin 20 mg

## 2021-11-20 NOTE — Assessment & Plan Note (Signed)
Excellent control. Continue telmisartan/HCTZ 80/25, verapamil 300 mg

## 2021-11-20 NOTE — Assessment & Plan Note (Signed)
Chronic, stable.  Continue venlafaxine 150mg   daily

## 2021-11-20 NOTE — Progress Notes (Signed)
Subjective:    Patient ID: Vanessa Higgins, female    DOB: [birth year], 70 y.o.   MRN: [id number]  CC: Vanessa Higgins is a 70 y.o. female who presents today for follow up.   HPI: She feels well today.  No new complaints.  DM-compliant with metformin 1000 mg BID, Farxiga 10 mg daily, Ozempic 2 mg weekly.  She feels that she is eating less on Ozempic.  She is tolerating medication well.  HTN- compliant with telmisartan/HCTZ 80/25, verapamil 300 mg. At home and work, 120/60. No cp, dizziness, sob  HLD- compliant with atorvastatin 20mg   Depression and anxiety-compliant with venlafaxine 150mg   daily.  She feels medication is adequate. depression and anxiety well controlled.  HISTORY:  Past Medical History:  Diagnosis Date   Allergy    hay fever   Anemia    Ankle fracture, left 04/08/2014   Anxiety and depression    Arthritis    Asthma    well controlled    Blood transfusion abn reaction or complication, no procedure mishap    Blood transfusion without reported diagnosis    COVID-19 virus infection 08/2020   Depression    Diabetes mellitus    Disc displacement, lumbar    Dysrhythmia    pvc's   Fatty liver    GERD (gastroesophageal reflux disease)    Headache(784.0)    History of hiatal hernia    small   Hyperlipidemia    Hypertension    Joint pain    Low vitamin B12 level    Low vitamin D level    Migraine    Mitral valve prolapse    Obstructive sleep apnea    CPAP   Osteoarthritis    Palpitations    PONV (postoperative nausea and vomiting)    h/o in the past   Sleep apnea 04/06/2017   uses CPAP   SOB (shortness of breath)    UTI (lower urinary tract infection)    Vitamin B 12 deficiency    Vitamin D deficiency    Wears glasses    Past Surgical History:  Procedure Laterality Date   APPENDECTOMY  1977   COLONOSCOPY  last 11/17/2014   INGUINAL HERNIA REPAIR Right 11/01/2020   Procedure: HERNIA REPAIR INGUINAL ADULT;  Surgeon: Robert Bellow, MD;  Location:  [facility];  Service: General;  Laterality: Right;   JOINT REPLACEMENT     KNEE ARTHROPLASTY Right 03/03/2019   Procedure: COMPUTER ASSISTED TOTAL KNEE ARTHROPLASTY-RIGHT;  Surgeon: Dereck Leep, MD;  Location: [facility] ORS;  Service: Orthopedics;  Laterality: Right;   LUMBAR LAMINECTOMY/DECOMPRESSION MICRODISCECTOMY Right 02/06/2018   Procedure: Lumbar two Hemilaminectomy for Discectomy;  Surgeon: Ashok Pall, MD;  Location: [place];  Service: Neurosurgery;  Laterality: Right;   ORIF ANKLE FRACTURE Left 04/08/2014   Procedure: OPEN REDUCTION INTERNAL FIXATION (ORIF) ANKLE FRACTURE LEFT ANKLE FRACTURE OPEN TREATMENT BIMALLEOLAR ANKLE INCLUDES INTERNAL FIXATION;  Surgeon: Johnny Bridge, MD;  Location: [facility];  Service: Orthopedics;  Laterality: Left;   PAROTIDECTOMY  1980   left   POLYPECTOMY     TONSILLECTOMY AND ADENOIDECTOMY     as a child   TOTAL KNEE ARTHROPLASTY Bilateral    left   TUBAL LIGATION  05/1976   Family History  Problem Relation Age of Onset   Sudden death Mother    Cancer Mother 64       pancreatic   Heart disease Father 65   Sudden death Father    Cancer  Brother        colon   Colon cancer Brother 39   Stroke Maternal Grandmother    Heart disease Maternal Grandfather    Cancer Sister        brain   Breast cancer Neg Hx    Colon polyps Neg Hx    Esophageal cancer Neg Hx    Rectal cancer Neg Hx    Stomach cancer Neg Hx    Thyroid cancer Neg Hx     Allergies: Sulfa antibiotics, Lisinopril, Other, and Ramipril Current Outpatient Medications on File Prior to Visit  Medication Sig Dispense Refill   albuterol (PROVENTIL HFA;VENTOLIN HFA) 108 (90 Base) MCG/ACT inhaler Inhale 2 puffs into the lungs every 6 (six) hours as needed for wheezing or shortness of breath.     atorvastatin (LIPITOR) 20 MG tablet TAKE 1 TABLET BY MOUTH EVERYDAY AT BEDTIME 90 tablet 1   Budeson-Glycopyrrol-Formoterol (BREZTRI AEROSPHERE) 160-9-4.8 MCG/ACT AERO Inhale 2 puffs  into the lungs in the morning and at bedtime. 10.7 g 11   Cholecalciferol (VITAMIN D) 125 MCG (5000 UT) CAPS Take 5,000 Units by mouth daily.     COVID-19 mRNA Vac-TriS, Pfizer, (PFIZER-BIONT COVID-19 VAC-TRIS) SUSP injection Inject into the muscle. 0.3 mL 0   dapagliflozin propanediol (FARXIGA) 10 MG TABS tablet Take 1 tablet (10 mg total) by mouth daily before breakfast. 90 tablet 1   estradiol (ESTRACE) 0.1 MG/GM vaginal cream Place 0.5 g vaginally 2 (two) times a week. Place 0.5g nightly for two weeks then twice a week after 30 g 11   loratadine (CLARITIN) 10 MG tablet Take 10 mg by mouth at bedtime.      metFORMIN (GLUCOPHAGE) 1000 MG tablet Take 1 tablet (1,000 mg total) by mouth 2 (two) times daily with a meal. 180 tablet 1   montelukast (SINGULAIR) 10 MG tablet TAKE 1 TABLET BY MOUTH EVERYDAY AT BEDTIME 90 tablet 1   Multiple Vitamin (MULTIVITAMIN WITH MINERALS) TABS tablet Take 1 tablet by mouth daily.     omeprazole (PRILOSEC) 20 MG capsule TAKE 1 CAPSULE BY MOUTH DAILY BEFORE BREAKFAST. 90 capsule 1   Semaglutide, 2 MG/DOSE, 8 MG/3ML SOPN Inject 2 mg as directed once a week. 6 mL 2   telmisartan-hydrochlorothiazide (MICARDIS HCT) 80-25 MG tablet Take 1 tablet by mouth daily. 90 tablet 1   venlafaxine XR (EFFEXOR-XR) 150 MG 24 hr capsule TAKE 1 CAPSULE BY MOUTH DAILY WITH BREAKFAST. 90 capsule 1   Verapamil HCl CR 300 MG CP24 TAKE 1 CAPSULE BY MOUTH EVERY DAY 90 capsule 1   vitamin B-12 (CYANOCOBALAMIN) 1000 MCG tablet Take 1,000 mcg by mouth at bedtime.     Zoster Vaccine Adjuvanted [facility]) injection Inject into the muscle. 0.5 mL 1   ferrous sulfate 325 (65 FE) MG EC tablet TAKE 1 TABLET (325 MG TOTAL) BY MOUTH AT BEDTIME. (NOT COVERED BY INS.) (Patient not taking: Reported on 11/20/2021) 90 tablet 1   No current facility-administered medications on file prior to visit.    Social History   Tobacco Use   Smoking status: Never   Smokeless tobacco: Never  Vaping Use   Vaping  Use: Never used  Substance Use Topics   Alcohol use: No   Drug use: No    Review of Systems  Constitutional:  Negative for chills and fever.  Respiratory:  Negative for cough.   Cardiovascular:  Negative for chest pain and palpitations.  Gastrointestinal:  Negative for nausea and vomiting.  Neurological:  Negative for numbness.  Objective:    BP 124/68   Pulse 77   Temp (!) 96.2 F (35.7 C)   Ht 5' 2.99" (1.6 m)   Wt 185 lb 9.6 oz (84.2 kg)   SpO2 92%   BMI 32.89 kg/m  BP Readings from Last 3 Encounters:  11/20/21 124/68  09/14/21 102/63  08/06/21 114/60   Wt Readings from Last 3 Encounters:  11/20/21 185 lb 9.6 oz (84.2 kg)  09/14/21 191 lb (86.6 kg)  08/06/21 191 lb 6.4 oz (86.8 kg)    Physical Exam Vitals reviewed.  Constitutional:      Appearance: She is well-developed.  Eyes:     Conjunctiva/sclera: Conjunctivae normal.  Cardiovascular:     Rate and Rhythm: Normal rate and regular rhythm.     Pulses: Normal pulses.     Heart sounds: Normal heart sounds.  Pulmonary:     Effort: Pulmonary effort is normal.     Breath sounds: Normal breath sounds. No wheezing, rhonchi or rales.  Skin:    General: Skin is warm and dry.  Neurological:     Mental Status: She is alert.  Psychiatric:        Speech: Speech normal.        Behavior: Behavior normal.        Thought Content: Thought content normal.       Assessment & Plan:   Problem List Items Addressed This Visit       Cardiovascular and Mediastinum   Essential hypertension    Excellent control. Continue telmisartan/HCTZ 80/25, verapamil 300 mg        Endocrine   Diabetes mellitus ([place]) - Primary   Relevant Orders   POCT glycosylated hemoglobin (Hb A1C) (Completed)   Comprehensive metabolic panel     Other   Anxiety and depression    Chronic, stable.  Continue venlafaxine 150mg   daily      Hyperlipidemia    Lab Results  Component Value Date   HGBA1C 6.5 (A) 11/20/2021  Excellent  control.  Continue metformin 1000 mg BID, Farxiga 10 mg daily, Ozempic 2 mg weekly.      Hyperlipidemia LDL goal <70    Excellent control.  Continue atorvastatin 20 mg        I have discontinued Vanessa Higgins's OneTouch Verio. I am also having her maintain her loratadine, albuterol, multivitamin with minerals, vitamin B-12, Vitamin D, telmisartan-hydrochlorothiazide, dapagliflozin propanediol, Breztri Aerosphere, Pfizer-BioNT COVID-19 Vac-TriS, metFORMIN, Semaglutide (2 MG/DOSE), Zoster Vaccine Adjuvanted, estradiol, venlafaxine XR, Verapamil HCl CR, atorvastatin, omeprazole, montelukast, and ferrous sulfate.   Meds ordered this encounter  Medications   DISCONTD: glucose blood (ONETOUCH VERIO) test strip    Sig: 1 each by Other route as needed for other. Freestyle Elite    Dispense:  100 each    Refill:  3    Return precautions given.   Risks, benefits, and alternatives of the medications and treatment plan prescribed today were discussed, and patient expressed understanding.   Education regarding symptom management and diagnosis given to patient on AVS.  Continue to follow with Burnard Hawthorne, FNP for routine health maintenance.   [name] and I agreed with plan.   Mable Paris, FNP

## 2021-11-21 ENCOUNTER — Other Ambulatory Visit: Payer: Self-pay | Admitting: Family

## 2021-11-21 ENCOUNTER — Telehealth: Payer: Self-pay

## 2021-11-21 DIAGNOSIS — R899 Unspecified abnormal finding in specimens from other organs, systems and tissues: Secondary | ICD-10-CM

## 2021-11-21 NOTE — Telephone Encounter (Signed)
LMTCB. Need to scheduled pt for a lab appt in - weeks. Labs have been ordered.

## 2021-11-26 ENCOUNTER — Telehealth: Payer: Self-pay

## 2021-11-26 NOTE — Telephone Encounter (Signed)
LMTCB to schedule lab. That is all that's needed non-fasting. Office may schedule.

## 2021-11-30 ENCOUNTER — Encounter: Payer: Self-pay | Admitting: Gastroenterology

## 2021-11-30 ENCOUNTER — Ambulatory Visit (INDEPENDENT_AMBULATORY_CARE_PROVIDER_SITE_OTHER): Payer: HMO | Admitting: Gastroenterology

## 2021-11-30 ENCOUNTER — Other Ambulatory Visit: Payer: Self-pay

## 2021-11-30 ENCOUNTER — Other Ambulatory Visit (INDEPENDENT_AMBULATORY_CARE_PROVIDER_SITE_OTHER): Payer: HMO

## 2021-11-30 VITALS — BP 118/62 | HR 89 | Ht 63.0 in | Wt 187.1 lb

## 2021-11-30 DIAGNOSIS — R197 Diarrhea, unspecified: Secondary | ICD-10-CM

## 2021-11-30 DIAGNOSIS — R109 Unspecified abdominal pain: Secondary | ICD-10-CM | POA: Diagnosis not present

## 2021-11-30 LAB — TSH: TSH: 0.51 u[IU]/mL (ref 0.35–5.50)

## 2021-11-30 LAB — HEPATIC FUNCTION PANEL
ALT: 25 U/L (ref 0–35)
AST: 24 U/L (ref 0–37)
Albumin: 4.5 g/dL (ref 3.5–5.2)
Alkaline Phosphatase: 36 U/L — ABNORMAL LOW (ref 39–117)
Bilirubin, Direct: 0.2 mg/dL (ref 0.0–0.3)
Total Bilirubin: 1 mg/dL (ref 0.2–1.2)
Total Protein: 7.4 g/dL (ref 6.0–8.3)

## 2021-11-30 LAB — CBC WITH DIFFERENTIAL/PLATELET
Basophils Absolute: 0 10*3/uL (ref 0.0–0.1)
Basophils Relative: 0.4 % (ref 0.0–3.0)
Eosinophils Absolute: 0.3 10*3/uL (ref 0.0–0.7)
Eosinophils Relative: 4.6 % (ref 0.0–5.0)
HCT: 42.8 % (ref 36.0–46.0)
Hemoglobin: 14.3 g/dL (ref 12.0–15.0)
Lymphocytes Relative: 23.1 % (ref 12.0–46.0)
Lymphs Abs: 1.6 10*3/uL (ref 0.7–4.0)
MCHC: 33.4 g/dL (ref 30.0–36.0)
MCV: 95 fl (ref 78.0–100.0)
Monocytes Absolute: 0.7 10*3/uL (ref 0.1–1.0)
Monocytes Relative: 10.1 % (ref 3.0–12.0)
Neutro Abs: 4.2 10*3/uL (ref 1.4–7.7)
Neutrophils Relative %: 61.8 % (ref 43.0–77.0)
Platelets: 243 10*3/uL (ref 150.0–400.0)
RBC: 4.5 Mil/uL (ref 3.87–5.11)
RDW: 13.1 % (ref 11.5–15.5)
WBC: 6.8 10*3/uL (ref 4.0–10.5)

## 2021-11-30 LAB — C-REACTIVE PROTEIN: CRP: <1 mg/dL (ref 0.5–20.0)

## 2021-11-30 MED ORDER — DIPHENOXYLATE-ATROPINE 2.5-0.025 MG PO TABS: 1.0000 | ORAL_TABLET | Freq: Four times a day (QID) | ORAL | 1 refills | Status: AC | PRN

## 2021-11-30 NOTE — Progress Notes (Signed)
Chief Complaint: diarrhea  Referring Provider:  Burnard Hawthorne, FNP      ASSESSMENT AND PLAN;   #1. Diarrhea. D/d includes IBS-D, infectious causes, d/t meds, microscopic colitis, IBD, malabsorption, EPI, celiac disease and hypothyroidism.   #2. Lower abdo pain  Plan: -Stool studies for GI Pathogen (includes C. Diff ), fecal elastase, and Calprotectin -Stop multivitamins (stop magnesium), omeprazole. -Imodium AD QD and prn  -Lomotil 1 Q 6hrs prn #30, 1 refill -CBC, LFT, TSH, CRP, celiac serology -Call in 2 weeks.  If still with problems, would ask her to hold off on metformin x 2 weeks to determine if diarrhea gets better. -If still with problems, FS with Bx followed by CT AP, if needed.    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 2-3 Bms/day, without nocturnal symptoms, mostly postprandial. 1-2/week "explosive diarrhea". Probiotic helps a little.  No melena or hematochezia.  No unintentional weight loss.  No history suggestive of lactose or gluten intolerance.  Does have lower abdominal crampy pain, which gets better with defecation.  Mild abdominal bloating.  Does have loud borborygmi.  GB still in  No fever chills or night sweats.  No recent COVID.  No UGI symptoms.  Previous GI work-up: EGD 06/2020 - Small hiatal hernia. - Mildly prominent gastric folds. Bx-fundic gland polyps, Neg HP - Neg SB Bx for celiac  Colonoscopy 11/2019 -Moderate left colonic diverticulosis. -Otherwise normal colonoscopy to TI.  CT AP 1. No evidence of significant mesenteric arterial occlusive disease. 2. Moderate to large sized right inguinal hernia containing some distal small bowel with some associated fecalization of contents. No overt signs by CT of associated bowel obstruction or incarcerated hernia. 3. Possible degree of gastric wall thickening especially involving the proximal to mid stomach. This is nonspecific but may  implicate gastric pathology such as gastritis or infiltrative disease. Correlation with EGD may be helpful. 4. Sigmoid diverticulosis without evidence of diverticulitis. 5. No evidence of active gastrointestinal bleeding by CTA or vascular malformation  Wt Readings from Last 3 Encounters:  11/30/21 187 lb 2 oz (84.9 kg)  11/20/21 185 lb 9.6 oz (84.2 kg)  09/14/21 191 lb (86.6 kg)    Past Medical History:  Diagnosis Date   Allergy    hay fever   Anemia    Ankle fracture, left 04/08/2014   Anxiety and depression    Arthritis    Asthma    well controlled    Blood transfusion abn reaction or complication, no procedure mishap    Blood transfusion without reported diagnosis    COVID-19 virus infection 08/2020   Depression    Diabetes mellitus    Disc displacement, lumbar    Dysrhythmia    pvc's   Fatty liver    GERD (gastroesophageal reflux disease)    Headache(784.0)    History of hiatal hernia    small   Hyperlipidemia    Hypertension    Joint pain    Low vitamin B12 level    Low vitamin D level    Migraine    Mitral valve prolapse    Obstructive sleep apnea    CPAP   Osteoarthritis    Palpitations    PONV (postoperative nausea and vomiting)    h/o in the past   Sleep apnea 04/06/2017   uses CPAP   SOB (shortness of breath)    UTI (lower urinary tract infection)  Vitamin B 12 deficiency    Vitamin D deficiency    Wears glasses     Past Surgical History:  Procedure Laterality Date   APPENDECTOMY  1977   COLONOSCOPY  last 11/17/2014   INGUINAL HERNIA REPAIR Right 11/01/2020   Procedure: HERNIA REPAIR INGUINAL ADULT;  Surgeon: Robert Bellow, MD;  Location: [facility];  Service: General;  Laterality: Right;   JOINT REPLACEMENT     KNEE ARTHROPLASTY Right 03/03/2019   Procedure: COMPUTER ASSISTED TOTAL KNEE ARTHROPLASTY-RIGHT;  Surgeon: Dereck Leep, MD;  Location: [facility];  Service: Orthopedics;  Laterality: Right;   LUMBAR LAMINECTOMY/DECOMPRESSION  MICRODISCECTOMY Right 02/06/2018   Procedure: Lumbar two Hemilaminectomy for Discectomy;  Surgeon: Ashok Pall, MD;  Location: [place];  Service: Neurosurgery;  Laterality: Right;   ORIF ANKLE FRACTURE Left 04/08/2014   Procedure: OPEN REDUCTION INTERNAL FIXATION (ORIF) ANKLE FRACTURE LEFT ANKLE FRACTURE OPEN TREATMENT BIMALLEOLAR ANKLE INCLUDES INTERNAL FIXATION;  Surgeon: Johnny Bridge, MD;  Location: [place];  Service: Orthopedics;  Laterality: Left;   PAROTIDECTOMY  1980   left   POLYPECTOMY     TONSILLECTOMY AND ADENOIDECTOMY     as a child   TOTAL KNEE ARTHROPLASTY Bilateral    left   TUBAL LIGATION  05/1976    Family History  Problem Relation Age of Onset   Sudden death Mother    Cancer Mother 70       pancreatic   Heart disease Father 24   Sudden death Father    Cancer Brother        colon   Colon cancer Brother 21   Stroke Maternal Grandmother    Heart disease Maternal Grandfather    Cancer Sister        brain   Breast cancer Neg Hx    Colon polyps Neg Hx    Esophageal cancer Neg Hx    Rectal cancer Neg Hx    Stomach cancer Neg Hx    Thyroid cancer Neg Hx     Social History   Tobacco Use   Smoking status: Never   Smokeless tobacco: Never  Vaping Use   Vaping Use: Never used  Substance Use Topics   Alcohol use: No   Drug use: No    Current Outpatient Medications  Medication Sig Dispense Refill   albuterol (PROVENTIL HFA;VENTOLIN HFA) 108 (90 Base) MCG/ACT inhaler Inhale 2 puffs into the lungs every 6 (six) hours as needed for wheezing or shortness of breath.     atorvastatin (LIPITOR) 20 MG tablet TAKE 1 TABLET BY MOUTH EVERYDAY AT BEDTIME 90 tablet 1   Budeson-Glycopyrrol-Formoterol (BREZTRI AEROSPHERE) 160-9-4.8 MCG/ACT AERO Inhale 2 puffs into the lungs in the morning and at bedtime. 10.7 g 11   Cholecalciferol (VITAMIN D) 125 MCG (5000 UT) CAPS Take 5,000 Units by mouth daily.     dapagliflozin propanediol (FARXIGA) 10 MG TABS tablet  Take 1 tablet (10 mg total) by mouth daily before breakfast. 90 tablet 1   estradiol (ESTRACE) 0.1 MG/GM vaginal cream Place 0.5 g vaginally 2 (two) times a week. Place 0.5g nightly for two weeks then twice a week after 30 g 11   loratadine (CLARITIN) 10 MG tablet Take 10 mg by mouth at bedtime.      metFORMIN (GLUCOPHAGE) 1000 MG tablet Take 1 tablet (1,000 mg total) by mouth 2 (two) times daily with a meal. 180 tablet 1   montelukast (SINGULAIR) 10 MG tablet TAKE 1 TABLET BY MOUTH EVERYDAY AT  BEDTIME 90 tablet 1   omeprazole (PRILOSEC) 20 MG capsule TAKE 1 CAPSULE BY MOUTH DAILY BEFORE BREAKFAST. 90 capsule 1   ONETOUCH VERIO test strip USE 1 EACH BY OTHER ROUTE AS NEEDED FOR OTHER. FREESTYLE ELITE 100 strip 3   Semaglutide, 2 MG/DOSE, 8 MG/3ML SOPN Inject 2 mg as directed once a week. 6 mL 2   telmisartan-hydrochlorothiazide (MICARDIS HCT) 80-25 MG tablet Take 1 tablet by mouth daily. 90 tablet 1   venlafaxine XR (EFFEXOR-XR) 150 MG 24 hr capsule TAKE 1 CAPSULE BY MOUTH DAILY WITH BREAKFAST. 90 capsule 1   Verapamil HCl CR 300 MG CP24 TAKE 1 CAPSULE BY MOUTH EVERY DAY 90 capsule 1   vitamin B-12 (CYANOCOBALAMIN) 1000 MCG tablet Take 1,000 mcg by mouth at bedtime.     COVID-19 mRNA Vac-TriS, Pfizer, (PFIZER-BIONT COVID-19 VAC-TRIS) SUSP injection Inject into the muscle. 0.3 mL 0   Multiple Vitamin (MULTIVITAMIN WITH MINERALS) TABS tablet Take 1 tablet by mouth daily.     Zoster Vaccine Adjuvanted [facility]) injection Inject into the muscle. 0.5 mL 1   No current facility-administered medications for this visit.    Allergies  Allergen Reactions   Sulfa Antibiotics Hives, Shortness Of Breath and Rash   Lisinopril Cough   Other Other (See Comments) and Nausea And Vomiting   Ramipril Cough    Review of Systems:  neg     Physical Exam:    BP 118/62 (BP Location: Right Arm, Patient Position: Sitting, Cuff Size: Normal)   Pulse 89   Ht 5\' 3"  (1.6 m)   Wt 187 lb 2 oz (84.9 kg)   SpO2  93%   BMI 33.15 kg/m  Wt Readings from Last 3 Encounters:  11/30/21 187 lb 2 oz (84.9 kg)  11/20/21 185 lb 9.6 oz (84.2 kg)  09/14/21 191 lb (86.6 kg)   Constitutional:  Well-developed, in no acute distress. Psychiatric: Normal mood and affect. Behavior is normal. HEENT: Pupils normal.  Conjunctivae are normal. No scleral icterus. Cardiovascular: Normal rate, regular rhythm. No edema Pulmonary/chest: Effort normal and breath sounds normal. No wheezing, rales or rhonchi. Abdominal: Soft, nondistended. Nontender. Bowel sounds active throughout. There are no masses palpable. No hepatomegaly. Rectal: Deferred Neurological: Alert and oriented to person place and time. Skin: Skin is warm and dry. No rashes noted.  Data Reviewed: I have personally reviewed following labs and imaging studies  CBC: CBC Latest Ref Rng & Units 06/05/2021 04/10/2021 12/27/2020  WBC 4.0 - 10.5 K/uL 7.5 6.9 5.7  Hemoglobin 12.0 - 15.0 g/dL 14.1 14.1 13.6  Hematocrit 36.0 - 46.0 % 42.3 42.0 41.1  Platelets 150.0 - 400.0 K/uL 257.0 328.0 251.0    CMP: CMP Latest Ref Rng & Units 11/20/2021 04/10/2021 01/09/2021  Glucose 70 - 99 mg/dL 110(H) 163(H) 144(H)  BUN 6 - 23 mg/dL 16 21 16   Creatinine 0.40 - 1.20 mg/dL 0.71 0.80 0.68  Sodium 135 - 145 mEq/L 140 140 140  Potassium 3.5 - 5.1 mEq/L 3.6 4.0 4.5  Chloride 96 - 112 mEq/L 96 95(L) 98  CO2 19 - 32 mEq/L 32 30 33(H)  Calcium 8.4 - 10.5 mg/dL 10.0 10.1 11.2(H)  Total Protein 6.0 - 8.3 g/dL 7.5 7.6 -  Total Bilirubin 0.2 - 1.2 mg/dL 1.4(H) 0.8 -  Alkaline Phos 39 - 117 U/L 36(L) 45 -  AST 0 - 37 U/L 23 30 -  ALT 0 - 35 U/L 25 32 -     Radiology Studies: Pulmonary Function Test [facility] Only  Result Date: 11/02/2021 Spirometry Data Is Acceptable and Reproducible Moderate Obstructive Airways Disease without  Significant Broncho-Dilator Response +air trapping (increased RV) and +Hyperinflation Consider outpatient Pulmonary Consultation if needed Clinical Correlation  Advised      Carmell Austria, MD 11/30/2021, 9:40 AM  Cc: Burnard Hawthorne, FNP

## 2021-11-30 NOTE — Patient Instructions (Addendum)
If you are age 72 or older, your body mass index should be between 23-30. Your Body mass index is 33.15 kg/m. If this is out of the aforementioned range listed, please consider follow up with your Primary Care Provider.  If you are age 83 or younger, your body mass index should be between 19-25. Your Body mass index is 33.15 kg/m. If this is out of the aformentioned range listed, please consider follow up with your Primary Care Provider.   ________________________________________________________  The [facility] providers would like to encourage you to use [facility] to communicate with providers for non-urgent requests or questions.  Due to long hold times on the telephone, sending your provider a message by [facility] may be a faster and more efficient way to get a response.  Please allow 48 business hours for a response.  Please remember that this is for non-urgent requests.  _______________________________________________________  Please go to the lab on the 2nd floor [address] before you leave the office today.   We have sent the following medications to your pharmacy for you to pick up at your convenience: Imodium  Please try Imodium AD   Please call Dr. Leland Her nurse in 2 weeks at [phone]  to let her now how you are doing.   Please call with any questions or concerns.  Thank you,  Dr. Jackquline Denmark

## 2021-12-04 ENCOUNTER — Telehealth: Payer: Self-pay | Admitting: Gastroenterology

## 2021-12-04 LAB — CELIAC PANEL 10
Antigliadin Abs, IgA: 7 units (ref 0–19)
Endomysial IgA: NEGATIVE
Gliadin IgG: 2 units (ref 0–19)
IgA/Immunoglobulin A, Serum: 284 mg/dL (ref 64–422)
Tissue Transglut Ab: <2 U/mL (ref 0–5)
Transglutaminase IgA: <2 U/mL (ref 0–3)

## 2021-12-04 NOTE — Telephone Encounter (Signed)
Pt made aware of results and Dr. Lyndel Safe recommendations. Report was sent to pt PCP Pt verbalized understanding with all questions answered.

## 2021-12-04 NOTE — Telephone Encounter (Signed)
Inbound call from patient returning call. Best contact number [phone]

## 2021-12-05 ENCOUNTER — Ambulatory Visit: Payer: Medicare HMO | Admitting: Pulmonary Disease

## 2021-12-10 ENCOUNTER — Other Ambulatory Visit: Payer: Self-pay

## 2021-12-10 ENCOUNTER — Ambulatory Visit: Payer: HMO | Admitting: Pulmonary Disease

## 2021-12-10 ENCOUNTER — Encounter: Payer: Self-pay | Admitting: Pulmonary Disease

## 2021-12-10 VITALS — BP 118/70 | HR 90 | Temp 97.1°F | Ht 63.0 in | Wt 185.8 lb

## 2021-12-10 DIAGNOSIS — J4540 Moderate persistent asthma, uncomplicated: Secondary | ICD-10-CM

## 2021-12-10 DIAGNOSIS — J449 Chronic obstructive pulmonary disease, unspecified: Secondary | ICD-10-CM

## 2021-12-10 NOTE — Progress Notes (Signed)
Subjective:    Patient ID: Vanessa Higgins, female    DOB: 10/14/1951, 70 y.o.   MRN: [id number] Chief Complaint  Patient presents with   Follow-up    Asthmatic Bronchitis    HPI Vanessa Higgins is a 70 year old lifelong never smoker who presents for follow-up of asthma with chronic asthmatic bronchitis.  This is a scheduled visit.  We last saw her on 21 June 2021.  She has been on Breztri since that visit due to insurance not covering Trelegy.  She is actually doing well with this medication.  She has not had any asthmatic bronchitis exacerbations since last seen.  She did have an episode of laryngitis and enlarged lymph nodes after her last COVID booster 2 weeks ago but other than that she has not had any difficulties.  She has not had any increase in shortness of breath, cough or wheezing.  She has not had to use her rescue inhaler at all.  She continues to prefer the Breztri to the Trelegy as it keeps her controlled throughout the day. Does not endorse any fevers, chills or sweats.  No hoarseness.  Cough is very rare and usually nonproductive but for the most part this symptom is now well controlled.  No lower extremity edema.  No orthopnea or paroxysmal nocturnal dyspnea.  Overall, she feels well and looks well.  We discussed her pulmonary function testing as below.  DATA 11/01/2021 PFTs: FEV1 1.25 L or 59% predicted, FVC 2.23 L or 79% predicted, FEV1/FVC 56%.  No significant bronchodilator response.  Lung volumes show mild hyperinflation and air trapping.  There is significant reduction in airway resistance postbronchodilator.  Diffusion capacity normal.  Compared to PFTs 05/12/2020, no significant change.   PUL ASTHMA HISTORY 12/10/2021  Symptoms 0-2 days/week  Nighttime awakenings 0-2/month  Interference with activity No limitations  SABA use 0-2 days/wk  Exacerbations requiring oral steroids 0-1 / year  Asthma Severity Moderate Persistent    Review of Systems A 10 point review of systems was  performed and it is as noted above otherwise negative.  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   Depression, major, single episode, complete remission ([place]) 11/18/2018   HNP (herniated nucleus pulposus), lumbar 02/06/2018   Fatty liver disease, nonalcoholic 26/37/8588   GERD (gastroesophageal reflux disease) 10/15/2017   PVC's (premature ventricular contractions) 08/01/2017   OSA (obstructive sleep apnea) 04/21/2017   Vertigo 03/26/2017   Elevated liver enzymes 12/03/2016   Cough 06/05/2016   Irregular heart rate 06/05/2016   Vitamin D deficiency 12/02/2014   IDA (iron deficiency anemia) 10/20/2014   Routine general medical examination at a health care facility 05/03/2013   Diabetes mellitus ([place]) 07/09/2012   Essential hypertension 07/09/2012   Hyperlipidemia LDL goal <70 07/09/2012   Asthma 07/09/2012   Class 1 obesity due to excess calories with body mass index (BMI) of 33.0 to 33.9 in adult 07/09/2012   Allergies  Allergen Reactions   Sulfa Antibiotics Hives, Shortness Of Breath and Rash  Lisinopril Cough   Other Other (See Comments) and Nausea And Vomiting   Ramipril Cough   Current Meds  Medication Sig   albuterol (PROVENTIL HFA;VENTOLIN HFA) 108 (90 Base) MCG/ACT inhaler Inhale 2 puffs into the lungs every 6 (six) hours as needed for wheezing or shortness of breath.   atorvastatin (LIPITOR) 20 MG tablet TAKE 1 TABLET BY MOUTH EVERYDAY AT BEDTIME    Budeson-Glycopyrrol-Formoterol (BREZTRI AEROSPHERE) 160-9-4.8 MCG/ACT AERO Inhale 2 puffs into the lungs in the morning and at bedtime.   Cholecalciferol (VITAMIN D) 125 MCG (5000 UT) CAPS Take 5,000 Units by mouth daily.   COVID-19 mRNA Vac-TriS, Pfizer, (PFIZER-BIONT COVID-19 VAC-TRIS) SUSP injection Inject into the muscle.   dapagliflozin propanediol (FARXIGA) 10 MG TABS tablet Take 1 tablet (10 mg total) by mouth daily before breakfast.   diphenoxylate-atropine (LOMOTIL) 2.5-0.025 MG tablet Take 1 tablet by mouth every 6 (six) hours as needed for diarrhea or loose stools.   estradiol (ESTRACE) 0.1 MG/GM vaginal cream Place 0.5 g vaginally 2 (two) times a week. Place 0.5g nightly for two weeks then twice a week after   loratadine (CLARITIN) 10 MG tablet Take 10 mg by mouth at bedtime.    metFORMIN (GLUCOPHAGE) 1000 MG tablet Take 1 tablet (1,000 mg total) by mouth 2 (two) times daily with a meal.   montelukast (SINGULAIR) 10 MG tablet TAKE 1 TABLET BY MOUTH EVERYDAY AT BEDTIME   Multiple Vitamin (MULTIVITAMIN WITH MINERALS) TABS tablet Take 1 tablet by mouth daily.   omeprazole (PRILOSEC) 20 MG capsule TAKE 1 CAPSULE BY MOUTH DAILY BEFORE BREAKFAST.   ONETOUCH VERIO test strip USE 1 EACH BY OTHER ROUTE AS NEEDED FOR OTHER. FREESTYLE ELITE   Semaglutide, 2 MG/DOSE, 8 MG/3ML SOPN Inject 2 mg as directed once a week.   telmisartan-hydrochlorothiazide (MICARDIS HCT) 80-25 MG tablet Take 1 tablet by mouth daily.   venlafaxine XR (EFFEXOR-XR) 150 MG 24 hr capsule TAKE 1 CAPSULE BY MOUTH DAILY WITH BREAKFAST.   Verapamil HCl CR 300 MG CP24 TAKE 1 CAPSULE BY MOUTH EVERY DAY   vitamin B-12 (CYANOCOBALAMIN) 1000 MCG tablet Take 1,000 mcg by mouth at bedtime.   Zoster Vaccine Adjuvanted [facility]) injection Inject into the muscle.   Immunization History  Administered Date(s) Administered   Fluad Quad(high Dose 65+) 10/02/2019   Influenza-Unspecified 09/02/2014, 10/02/2015, 09/28/2020, 09/28/2021   MMR  07/09/2010   PFIZER Comirnaty(Gray Top)Covid-19 Tri-Sucrose Vaccine 06/01/2021   PFIZER(Purple Top)SARS-COV-2 Vaccination 12/02/2019, 01/04/2020, 12/14/2020   Pneumococcal Conjugate-13 12/02/2014   Pneumococcal Polysaccharide-23 07/09/2001, 12/04/2016   Td 07/09/2010   Tdap 08/22/2021   Zoster, Live 10/27/2013       Objective:   Physical Exam BP 118/70 (BP Location: Left Arm, Patient Position: Sitting, Cuff Size: Normal)   Pulse 90   Temp (!) 97.1 F (36.2 C) (Oral)   Wt 185 lb 12.8 oz (84.3 kg)   SpO2 94%   BMI 32.91 kg/m  GENERAL: This is a well-developed obese woman in no acute respiratory distress.  She is fully ambulatory.  No conversational dyspnea. HEAD: Normocephalic, atraumatic. EYES: Pupils equal, round, reactive to light.  No scleral icterus. MOUTH: Nose/mouth/throat not examined due to masking requirements for COVID 19. NECK: Supple. No thyromegaly. No nodules. No JVD.  Trachea midline.  No crepitus. PULMONARY: Symmetrical air entry.  Moving air well.  No adventitious sounds. CARDIOVASCULAR: S1 and S2. Regular rate and rhythm.  No rubs murmurs gallops heard. GASTROINTESTINAL: Obese abdomen, soft. MUSCULOSKELETAL: No joint deformity, no clubbing, no edema.  Increased AP diameter. NEUROLOGIC: No focal deficits noted.  Speech is fluent.  Awake, alert, oriented. SKIN: Intact,warm,dry, limited exam: No rashes. PSYCH: Normal mood and behavior.     Assessment & Plan:     ICD-10-CM   1. Asthmatic bronchitis , chronic (HCC)  J44.9    Continue Breztri 2 puffs twice a day Continue albuterol as needed    2. Moderate persistent asthma without complication  U13.14    As above Patient well compensated     Patient is doing well on her current regimen.  We will see her in follow-up in 6 months time she is to contact us prior to that time should any new difficulties arise.  Renold Don, MD Advanced Bronchoscopy PCCM [facility]    *This note was  dictated using voice recognition software/Dragon.  Despite best efforts to proofread, errors can occur which can change the meaning. Any transcriptional errors that result from this process are unintentional and may not be fully corrected at the time of dictation.

## 2021-12-10 NOTE — Patient Instructions (Signed)
Continue use of Breztri 2 puffs twice a day.  New as needed albuterol.  We will see you in follow-up in 6 months time call sooner should any new problems arise.

## 2021-12-13 ENCOUNTER — Telehealth: Payer: HMO

## 2021-12-13 ENCOUNTER — Telehealth: Payer: Self-pay

## 2021-12-13 NOTE — Chronic Care Management (AMB) (Signed)
°  Care Management   Note  12/13/2021 Name: [name] MRN: [id number] DOB: 1951/08/29  Vanessa Higgins is a 70 y.o. year old female who is a primary care patient of Vidal Schwalbe, Yvetta Coder, FNP and is actively engaged with the care management team. I reached out to Vanessa Higgins by phone today to assist with re-scheduling a follow up visit with the Pharmacist  Follow up plan: Unsuccessful telephone outreach attempt made. A HIPAA compliant phone message was left for the patient providing contact information and requesting a return call.  The care management team will reach out to the patient again over the next 7 days.  If patient returns call to provider office, please advise to call [facility]  at [place], [place], [place], [place] [address] Direct Dial: [phone] [email or web address] Website: [email or web address]

## 2021-12-14 ENCOUNTER — Telehealth: Payer: Self-pay | Admitting: Gastroenterology

## 2021-12-14 NOTE — Telephone Encounter (Signed)
Left message for pt to call back  °

## 2021-12-14 NOTE — Telephone Encounter (Signed)
Pt states that she was advised by Dr. Lyndel Safe to stop taking her Prilosec and Multivitamin. Pt states that she was only able to stop the Prilosec for 5 days due to the heartburn that she was experiencing. Pt states that she has only had 2 loose stools in the last two weeks and just wanted to update [name]. Pt stated that she is aware that she needs to complete labs for the stools. Pt verbalized understanding with all questions answered.

## 2021-12-14 NOTE — Telephone Encounter (Signed)
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

## 2021-12-14 NOTE — Telephone Encounter (Signed)
Patient called this morning.  She said Dr. Lyndel Safe had instructed her to discontinue her Prilosec and multivitamin to see if it helped with the diarrhea.  She has done this for 2 weeks.  During that time, she has only had two loose stools; however, she could not tolerate being off the Prilosec and resumed taking it.  Overall, though, she says she feels like she's doing better.  She also has not been able to do the stool samples as of today.  Thank you.

## 2021-12-17 ENCOUNTER — Other Ambulatory Visit: Payer: Self-pay

## 2021-12-17 DIAGNOSIS — K219 Gastro-esophageal reflux disease without esophagitis: Secondary | ICD-10-CM

## 2021-12-17 MED ORDER — PANTOPRAZOLE SODIUM 20 MG PO TBEC: 20.0000 mg | DELAYED_RELEASE_TABLET | Freq: Every day | ORAL | 6 refills | Status: DC

## 2021-12-17 NOTE — Telephone Encounter (Signed)
Prescription sent to pharmacy.  Pt granddaughter Haze Boyden  made aware  of the change in the medication. Pt sent a mychart message to make aware of the change.

## 2021-12-19 ENCOUNTER — Ambulatory Visit: Payer: Medicare HMO | Admitting: Obstetrics and Gynecology

## 2021-12-25 DIAGNOSIS — E119 Type 2 diabetes mellitus without complications: Secondary | ICD-10-CM | POA: Diagnosis not present

## 2021-12-25 LAB — HM DIABETES EYE EXAM

## 2021-12-25 NOTE — Chronic Care Management (AMB) (Signed)
°  Care Management   Note  12/25/2021 Name: Vanessa Higgins MRN: [id number] DOB: 09/08/1951  Vanessa Higgins is a 70 y.o. year old female who is a primary care patient of Vidal Schwalbe, Yvetta Coder, FNP and is actively engaged with the care management team. I reached out to Vanessa Higgins by phone today to assist with re-scheduling a follow up visit with the Pharmacist  Follow up plan: Unsuccessful telephone outreach attempt made. A HIPAA compliant phone message was left for the patient providing contact information and requesting a return call.  The care management team will reach out to the patient again over the next 7 days.  If patient returns call to provider office, please advise to call [facility]  at [place], [place], [place],  [address] Direct Dial: [phone] [email or web address] Website: [email or web address]

## 2021-12-26 NOTE — Chronic Care Management (AMB) (Signed)
°  Care Management   Note  12/26/2021 Name: EUDELIA HILTUNEN MRN: [id number] DOB: 01-24-1951  [name] is a 70 y.o. year old female who is a primary care patient of Vidal Schwalbe, Yvetta Coder, FNP and is actively engaged with the care management team. I reached out to [name] by phone today to assist with re-scheduling a follow up visit with the Pharmacist  Follow up plan: Telephone appointment with care management team member scheduled for:01/10/2022  Noreene Larsson, [place], [place], [place] [address] Direct Dial: [phone] [email or web address] Website: [email or web address]

## 2021-12-28 ENCOUNTER — Telehealth: Payer: Self-pay | Admitting: Family

## 2021-12-28 NOTE — Telephone Encounter (Signed)
Copied from [place] [id number]. Topic: Medicare AWV >> Dec 28, 2021 10:54 AM Harris-Coley, Hannah Beat wrote: Reason for CRM: Left message for patient to schedule Annual Wellness Visit.  Please schedule with Nurse Health Advisor Denisa O'Brien-Blaney, LPN at [facility].  Please call [phone] ask for [facility]

## 2021-12-31 DIAGNOSIS — J019 Acute sinusitis, unspecified: Secondary | ICD-10-CM | POA: Diagnosis not present

## 2021-12-31 DIAGNOSIS — B9689 Other specified bacterial agents as the cause of diseases classified elsewhere: Secondary | ICD-10-CM | POA: Diagnosis not present

## 2021-12-31 DIAGNOSIS — J209 Acute bronchitis, unspecified: Secondary | ICD-10-CM | POA: Diagnosis not present

## 2021-12-31 DIAGNOSIS — Z20822 Contact with and (suspected) exposure to covid-19: Secondary | ICD-10-CM | POA: Diagnosis not present

## 2021-12-31 DIAGNOSIS — Z03818 Encounter for observation for suspected exposure to other biological agents ruled out: Secondary | ICD-10-CM | POA: Diagnosis not present

## 2022-01-04 ENCOUNTER — Other Ambulatory Visit: Payer: Self-pay | Admitting: Family

## 2022-01-04 DIAGNOSIS — I10 Essential (primary) hypertension: Secondary | ICD-10-CM

## 2022-01-07 ENCOUNTER — Ambulatory Visit (INDEPENDENT_AMBULATORY_CARE_PROVIDER_SITE_OTHER): Payer: HMO

## 2022-01-07 VITALS — Ht 63.0 in | Wt 185.0 lb

## 2022-01-07 DIAGNOSIS — Z0000 Encounter for general adult medical examination without abnormal findings: Secondary | ICD-10-CM

## 2022-01-07 NOTE — Patient Instructions (Addendum)
[name] , Thank you for taking time to come for your Medicare Wellness Visit. I appreciate your ongoing commitment to your health goals. Please review the following plan we discussed and let me know if I can assist you in the future.   These are the goals we discussed:  Goals       Patient Stated     Medication Monitoring (pt-stated)      Patient Goals/Self-Care Activities Over the next 90 days, patient will:  - take medications as prescribed check glucose daily, document, and provide at future appointments collaborate with provider on medication access solutions       Weight (lb) < 160 lb (72.6 kg) (pt-stated)      Healthy diet Walk for exercise        This is a list of the screening recommended for you and due dates:  Health Maintenance  Topic Date Due   Zoster (Shingles) Vaccine (1 of 2) 04/07/2022*   Hemoglobin A1C  05/20/2022   Complete foot exam   11/20/2022   Eye exam for diabetics  12/27/2022   Mammogram  08/30/2023   Colon Cancer Screening  12/07/2024   Tetanus Vaccine  08/23/2031   Pneumonia Vaccine  Completed   Flu Shot  Completed   DEXA scan (bone density measurement)  Completed   COVID-19 Vaccine  Completed   Hepatitis C Screening: USPSTF Recommendation to screen - Ages 59-79 yo.  Completed   HPV Vaccine  Aged Out  *Topic was postponed. The date shown is not the original due date.    Advanced directives: on file  Conditions/risks identified: none new  Follow up in one year for your annual wellness visit    Preventive Care 65 Years and Older, Female Preventive care refers to lifestyle choices and visits with your health care provider that can promote health and wellness. What does preventive care include? A yearly physical exam. This is also called an annual well check. Dental exams once or twice a year. Routine eye exams. Ask your health care provider how often you should have your eyes checked. Personal lifestyle choices, including: Daily care  of your teeth and gums. Regular physical activity. Eating a healthy diet. Avoiding tobacco and drug use. Limiting alcohol use. Practicing safe sex. Taking low-dose aspirin every day. Taking vitamin and mineral supplements as recommended by your health care provider. What happens during an annual well check? The services and screenings done by your health care provider during your annual well check will depend on your age, overall health, lifestyle risk factors, and family history of disease. Counseling  Your health care provider may ask you questions about your: Alcohol use. Tobacco use. Drug use. Emotional well-being. Home and relationship well-being. Sexual activity. Eating habits. History of falls. Memory and ability to understand (cognition). Work and work Statistician. Reproductive health. Screening  You may have the following tests or measurements: Height, weight, and BMI. Blood pressure. Lipid and cholesterol levels. These may be checked every 5 years, or more frequently if you are over 47 years old. Skin check. Lung cancer screening. You may have this screening every year starting at age 70 if you have a 30-pack-year history of smoking and currently smoke or have quit within the past 15 years. Fecal occult blood test (FOBT) of the stool. You may have this test every year starting at age 74. Flexible sigmoidoscopy or colonoscopy. You may have a sigmoidoscopy every 5 years or a colonoscopy every 10 years starting at age 71. Hepatitis  C blood test. Hepatitis B blood test. Sexually transmitted disease (STD) testing. Diabetes screening. This is done by checking your blood sugar (glucose) after you have not eaten for a while (fasting). You may have this done every 1-3 years. Bone density scan. This is done to screen for osteoporosis. You may have this done starting at age 39. Mammogram. This may be done every 1-2 years. Talk to your health care provider about how often you  should have regular mammograms. Talk with your health care provider about your test results, treatment options, and if necessary, the need for more tests. Vaccines  Your health care provider may recommend certain vaccines, such as: Influenza vaccine. This is recommended every year. Tetanus, diphtheria, and acellular pertussis (Tdap, Td) vaccine. You may need a Td booster every 10 years. Zoster vaccine. You may need this after age 36. Pneumococcal 13-valent conjugate (PCV13) vaccine. One dose is recommended after age 18. Pneumococcal polysaccharide (PPSV23) vaccine. One dose is recommended after age 91. Talk to your health care provider about which screenings and vaccines you need and how often you need them. This information is not intended to replace advice given to you by your health care provider. Make sure you discuss any questions you have with your health care provider. Document Released: 01/12/2016 Document Revised: 09/04/2016 Document Reviewed: 10/17/2015 Elsevier Interactive Patient Education  2017 [place] Prevention in the Home Falls can cause injuries. They can happen to people of all ages. There are many things you can do to make your home safe and to help prevent falls. What can I do on the outside of my home? Regularly fix the edges of walkways and driveways and fix any cracks. Remove anything that might make you trip as you walk through a door, such as a raised step or threshold. Trim any bushes or trees on the path to your home. Use bright outdoor lighting. Clear any walking paths of anything that might make someone trip, such as rocks or tools. Regularly check to see if handrails are loose or broken. Make sure that both sides of any steps have handrails. Any raised decks and porches should have guardrails on the edges. Have any leaves, snow, or ice cleared regularly. Use sand or salt on walking paths during winter. Clean up any spills in your garage right away.  This includes oil or grease spills. What can I do in the bathroom? Use night lights. Install grab bars by the toilet and in the tub and shower. Do not use towel bars as grab bars. Use non-skid mats or decals in the tub or shower. If you need to sit down in the shower, use a plastic, non-slip stool. Keep the floor dry. Clean up any water that spills on the floor as soon as it happens. Remove soap buildup in the tub or shower regularly. Attach bath mats securely with double-sided non-slip rug tape. Do not have throw rugs and other things on the floor that can make you trip. What can I do in the bedroom? Use night lights. Make sure that you have a light by your bed that is easy to reach. Do not use any sheets or blankets that are too big for your bed. They should not hang down onto the floor. Have a firm chair that has side arms. You can use this for support while you get dressed. Do not have throw rugs and other things on the floor that can make you trip. What can I do in the  kitchen? Clean up any spills right away. Avoid walking on wet floors. Keep items that you use a lot in easy-to-reach places. If you need to reach something above you, use a strong step stool that has a grab bar. Keep electrical cords out of the way. Do not use floor polish or wax that makes floors slippery. If you must use wax, use non-skid floor wax. Do not have throw rugs and other things on the floor that can make you trip. What can I do with my stairs? Do not leave any items on the stairs. Make sure that there are handrails on both sides of the stairs and use them. Fix handrails that are broken or loose. Make sure that handrails are as long as the stairways. Check any carpeting to make sure that it is firmly attached to the stairs. Fix any carpet that is loose or worn. Avoid having throw rugs at the top or bottom of the stairs. If you do have throw rugs, attach them to the floor with carpet tape. Make sure that  you have a light switch at the top of the stairs and the bottom of the stairs. If you do not have them, ask someone to add them for you. What else can I do to help prevent falls? Wear shoes that: Do not have high heels. Have rubber bottoms. Are comfortable and fit you well. Are closed at the toe. Do not wear sandals. If you use a stepladder: Make sure that it is fully opened. Do not climb a closed stepladder. Make sure that both sides of the stepladder are locked into place. Ask someone to hold it for you, if possible. Clearly mark and make sure that you can see: Any grab bars or handrails. First and last steps. Where the edge of each step is. Use tools that help you move around (mobility aids) if they are needed. These include: Canes. Walkers. Scooters. Crutches. Turn on the lights when you go into a dark area. Replace any light bulbs as soon as they burn out. Set up your furniture so you have a clear path. Avoid moving your furniture around. If any of your floors are uneven, fix them. If there are any pets around you, be aware of where they are. Review your medicines with your doctor. Some medicines can make you feel dizzy. This can increase your chance of falling. Ask your doctor what other things that you can do to help prevent falls. This information is not intended to replace advice given to you by your health care provider. Make sure you discuss any questions you have with your health care provider. Document Released: 10/12/2009 Document Revised: 05/23/2016 Document Reviewed: 01/20/2015 Elsevier Interactive Patient Education  2017 [employer].

## 2022-01-07 NOTE — Progress Notes (Signed)
Subjective:   Vanessa Higgins is a 71 y.o. female who presents for an Initial Medicare Annual Wellness Visit.  Review of Systems    No ROS.  Medicare Wellness Virtual Visit.  Visual/audio telehealth visit, UTA vital signs.   See social history for additional risk factors.         Objective:    Today's Vitals   01/07/22 0904  Weight: 185 lb (83.9 kg)  Height: 5\' 3"  (1.6 m)   Body mass index is 32.77 kg/m.  Advanced Directives 01/07/2022 11/01/2020 09/18/2020 06/28/2019 03/03/2019 03/03/2019 02/18/2019  Does Patient Have a Medical Advance Directive? Yes Yes Yes Yes Yes Yes Yes  Type of 02/20/2019 of [place];Living will Healthcare Power of [place];Living will - Healthcare Power of [place];Living will Healthcare Power of [place];Living will Healthcare Power of [place];Living will Healthcare Power of [place];Living will  Does patient want to make changes to medical advance directive? No - Patient declined No - Patient declined - No - Patient declined No - Patient declined - No - Patient declined  Copy of Healthcare Power of Attorney in Chart? Yes - validated most recent copy scanned in chart (See row information) Yes - validated most recent copy scanned in chart (See row information) - - Yes - validated most recent copy scanned in chart (See row information) Yes - validated most recent copy scanned in chart (See row information) No - copy requested  Would patient like information on creating a medical advance directive? - - - - - - -    Current Medications (verified) Outpatient Encounter Medications as of 01/07/2022  Medication Sig   albuterol (PROVENTIL HFA;VENTOLIN HFA) 108 (90 Base) MCG/ACT inhaler Inhale 2 puffs into the lungs every 6 (six) hours as needed for wheezing or shortness of breath.   atorvastatin (LIPITOR) 20 MG tablet TAKE 1 TABLET BY MOUTH EVERYDAY AT BEDTIME   Budeson-Glycopyrrol-Formoterol (BREZTRI AEROSPHERE) 160-9-4.8 MCG/ACT AERO Inhale 2 puffs  into the lungs in the morning and at bedtime.   Cholecalciferol (VITAMIN D) 125 MCG (5000 UT) CAPS Take 5,000 Units by mouth daily.   COVID-19 mRNA Vac-TriS, Pfizer, (PFIZER-BIONT COVID-19 VAC-TRIS) SUSP injection Inject into the muscle.   dapagliflozin propanediol (FARXIGA) 10 MG TABS tablet Take 1 tablet (10 mg total) by mouth daily before breakfast.   diphenoxylate-atropine (LOMOTIL) 2.5-0.025 MG tablet Take 1 tablet by mouth every 6 (six) hours as needed for diarrhea or loose stools.   estradiol (ESTRACE) 0.1 MG/GM vaginal cream Place 0.5 g vaginally 2 (two) times a week. Place 0.5g nightly for two weeks then twice a week after   loratadine (CLARITIN) 10 MG tablet Take 10 mg by mouth at bedtime.    metFORMIN (GLUCOPHAGE) 1000 MG tablet Take 1 tablet (1,000 mg total) by mouth 2 (two) times daily with a meal.   montelukast (SINGULAIR) 10 MG tablet TAKE 1 TABLET BY MOUTH EVERYDAY AT BEDTIME   Multiple Vitamin (MULTIVITAMIN WITH MINERALS) TABS tablet Take 1 tablet by mouth daily.   ONETOUCH VERIO test strip USE 1 EACH BY OTHER ROUTE AS NEEDED FOR OTHER. FREESTYLE ELITE   pantoprazole (PROTONIX) 20 MG tablet Take 1 tablet (20 mg total) by mouth daily.   Semaglutide, 2 MG/DOSE, 8 MG/3ML SOPN Inject 2 mg as directed once a week.   telmisartan-hydrochlorothiazide (MICARDIS HCT) 80-25 MG tablet TAKE 1 TABLET BY MOUTH EVERY DAY   venlafaxine XR (EFFEXOR-XR) 150 MG 24 hr capsule TAKE 1 CAPSULE BY MOUTH DAILY WITH BREAKFAST.   Verapamil HCl  CR 300 MG CP24 TAKE 1 CAPSULE BY MOUTH EVERY DAY   vitamin B-12 (CYANOCOBALAMIN) 1000 MCG tablet Take 1,000 mcg by mouth at bedtime.   Zoster Vaccine Adjuvanted [facility]) injection Inject into the muscle.   No facility-administered encounter medications on file as of 01/07/2022.    Allergies (verified) Sulfa antibiotics, Lisinopril, Other, and Ramipril   History: Past Medical History:  Diagnosis Date   Allergy    hay fever   Anemia    Ankle fracture, left  04/08/2014   Anxiety and depression    Arthritis    Asthma    well controlled    Blood transfusion abn reaction or complication, no procedure mishap    Blood transfusion without reported diagnosis    COVID-19 virus infection 08/2020   Depression    Diabetes mellitus    Disc displacement, lumbar    Dysrhythmia    pvc's   Fatty liver    GERD (gastroesophageal reflux disease)    Headache(784.0)    History of hiatal hernia    small   Hyperlipidemia    Hypertension    Joint pain    Low vitamin B12 level    Low vitamin D level    Migraine    Mitral valve prolapse    Obstructive sleep apnea    CPAP   Osteoarthritis    Palpitations    PONV (postoperative nausea and vomiting)    h/o in the past   Sleep apnea 04/06/2017   uses CPAP   SOB (shortness of breath)    UTI (lower urinary tract infection)    Vitamin B 12 deficiency    Vitamin D deficiency    Wears glasses    Past Surgical History:  Procedure Laterality Date   APPENDECTOMY  1977   COLONOSCOPY  last 11/17/2014   INGUINAL HERNIA REPAIR Right 11/01/2020   Procedure: HERNIA REPAIR INGUINAL ADULT;  Surgeon: Robert Bellow, MD;  Location: [facility];  Service: General;  Laterality: Right;   JOINT REPLACEMENT     KNEE ARTHROPLASTY Right 03/03/2019   Procedure: COMPUTER ASSISTED TOTAL KNEE ARTHROPLASTY-RIGHT;  Surgeon: Dereck Leep, MD;  Location: [facility];  Service: Orthopedics;  Laterality: Right;   LUMBAR LAMINECTOMY/DECOMPRESSION MICRODISCECTOMY Right 02/06/2018   Procedure: Lumbar two Hemilaminectomy for Discectomy;  Surgeon: Ashok Pall, MD;  Location: [facility];  Service: Neurosurgery;  Laterality: Right;   ORIF ANKLE FRACTURE Left 04/08/2014   Procedure: OPEN REDUCTION INTERNAL FIXATION (ORIF) ANKLE FRACTURE LEFT ANKLE FRACTURE OPEN TREATMENT BIMALLEOLAR ANKLE INCLUDES INTERNAL FIXATION;  Surgeon: Johnny Bridge, MD;  Location: [facility];  Service: Orthopedics;  Laterality: Left;   PAROTIDECTOMY  1980    left   POLYPECTOMY     TONSILLECTOMY AND ADENOIDECTOMY     as a child   TOTAL KNEE ARTHROPLASTY Bilateral    left   TUBAL LIGATION  05/1976   Family History  Problem Relation Age of Onset   Sudden death Mother    Cancer Mother 52       pancreatic   Heart disease Father 67   Sudden death Father    Cancer Brother        colon   Colon cancer Brother 36   Stroke Maternal Grandmother    Heart disease Maternal Grandfather    Cancer Sister        brain   Breast cancer Neg Hx    Colon polyps Neg Hx    Esophageal cancer Neg Hx    Rectal  cancer Neg Hx    Stomach cancer Neg Hx    Thyroid cancer Neg Hx    Social History   Socioeconomic History   Marital status: Widowed    Spouse name: Not on file   Number of children: Not on file   Years of education: Not on file   Highest education level: Not on file  Occupational History   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 Husband deceased 03/27/08.      Work - [place] [employer]- [employer], [employer]   Diet - healthy   Exercise - gym 2 x per week   Social Determinants of Health   Financial Resource Strain: Medium Risk   Difficulty of Paying Living Expenses: Somewhat hard  Food Insecurity: No Food Insecurity   Worried About Charity fundraiser in the Last Year: Never true   Arboriculturist in the Last Year: Never true  Transportation Needs: No Transportation Needs   Lack of Transportation (Medical): No   Lack of Transportation (Non-Medical): No  Physical Activity: Not on file  Stress: No Stress Concern Present   Feeling of Stress : Not at all  Social Connections: Unknown   Frequency of Communication with Friends and  Family: More than three times a week   Frequency of Social Gatherings with Friends and Family: More than three times a week   Attends Religious Services: Not on Electrical engineer or Organizations: Not on file   Attends Archivist Meetings: Not on file   Marital Status: Not on file    Tobacco Counseling Counseling given: Not Answered   Clinical Intake:  Pre-visit preparation completed: Yes        Diabetes: Yes (Followed by PCP)  How often do you need to have someone help you when you read instructions, pamphlets, or other written materials from your doctor or pharmacy?: 1 - Never  Nutrition Risk Assessment: Does the patient have any non-healing wounds?  No   Diabetes Management: Is the patient seen by Chronic Care Management for management of their diabetes?  Yes Would the patient like to be referred to a Nutritionist or for Diabetic Management?  No    Interpreter Needed?: No      Activities of Daily Living No flowsheet data found.  Patient Care Team: Burnard Hawthorne, FNP as PCP - General (Family Medicine) End, Harrell Gave, MD as PCP - Cardiology (Cardiology) De Hollingshead, RPH-CPP (Pharmacist)  Indicate any recent Medical Services you may have received from other than [facility] providers in the past year (date may be approximate).     Assessment:   This is a routine wellness examination for Vanessa Higgins.  Virtual Visit via Telephone Note  I connected with  Vanessa Higgins on 01/07/22 at  9:00 AM EST by telephone and verified that I am speaking with the correct person using two identifiers.  Persons participating in the virtual visit: patient/Nurse Health Advisor   I discussed the limitations, risks, security and privacy concerns of performing an evaluation and management service by telephone and the availability of in person appointments. The  patient expressed understanding and agreed to proceed.  Interactive audio and video  telecommunications were attempted between this nurse and patient, however failed, due to patient having technical difficulties OR patient did not have access to video capability.  We continued and completed visit with audio only.  Some vital signs may be absent or patient reported.   Hearing/Vision screen Hearing Screening - Comments:: Patient is able to hear conversational tones without difficulty.  No issues reported.  Dietary issues and exercise activities discussed:   Healthy diet Good water intake   Goals Addressed               This Visit's Progress     Patient Stated     Medication Monitoring (pt-stated)        Patient Goals/Self-Care Activities Over the next 90 days, patient will:  - take medications as prescribed check glucose daily, document, and provide at future appointments collaborate with provider on medication access solutions       Weight (lb) < 160 lb (72.6 kg) (pt-stated)   185 lb (83.9 kg)     Healthy diet Walk for exercise       Depression Screen PHQ 2/9 Scores 01/07/2022 11/20/2021 08/06/2021 05/10/2020 09/01/2019 09/01/2019 06/28/2019  PHQ - 2 Score 0 0 0 4 0 0 0  PHQ- 9 Score - - - 11 0 0 -  Exception Documentation - - - Medical reason - - -    Fall Risk Fall Risk  11/20/2021 08/06/2021 04/10/2021 04/04/2021 10/12/2019  Falls in the past year? 0 0 0 0 0  Number falls in past yr: 0 0 0 0 -  Comment - - - - -  Injury with Fall? 0 0 0 0 -  Comment - - - - -  Risk for fall due to : - History of fall(s) - - -  Follow up Falls evaluation completed Falls evaluation completed Falls evaluation completed Falls evaluation completed Falls evaluation completed    [place]: Home free of loose throw rugs in walkways, pet beds, electrical cords, etc? Yes  Adequate lighting in your home to reduce risk of falls? Yes   ASSISTIVE DEVICES UTILIZED TO PREVENT FALLS: Life alert? No  Use of a cane, walker or w/c? No   TIMED UP AND  GO: Was the test performed? No .   Cognitive Function:        Immunizations Immunization History  Administered Date(s) Administered   Fluad Quad(high Dose 65+) 10/02/2019   Influenza-Unspecified 09/02/2014, 10/02/2015, 09/28/2020, 09/28/2021   MMR 07/09/2010   PFIZER Comirnaty(Gray Top)Covid-19 Tri-Sucrose Vaccine 12/02/2019, 01/04/2020, 12/14/2020, 06/01/2021   PFIZER(Purple Top)SARS-COV-2 Vaccination 12/02/2019, 01/04/2020, 12/14/2020   Pneumococcal Conjugate-13 12/02/2014   Pneumococcal Polysaccharide-23 07/09/2001, 12/04/2016   Td 07/09/2010   Tdap 08/22/2021   Zoster, Live 10/27/2013   Shingrix Completed?: No.    Education has been provided regarding the importance of this vaccine. Patient has been advised to call insurance company to determine out of pocket expense if they have not yet received this vaccine. Advised may also receive vaccine at local pharmacy or Health Dept. Verbalized acceptance and understanding.  Screening Tests Health Maintenance  Topic Date Due   Zoster Vaccines- Shingrix (1 of 2) 04/07/2022 (Originally 08/03/1999)   HEMOGLOBIN A1C  05/20/2022   FOOT EXAM  11/20/2022   OPHTHALMOLOGY EXAM  12/27/2022   MAMMOGRAM  08/30/2023   COLONOSCOPY (Pts 45-32yrs Insurance coverage will need to be confirmed)  12/07/2024   TETANUS/TDAP  08/23/2031   Pneumonia Vaccine 29+ Years old  Completed   INFLUENZA VACCINE  Completed   DEXA SCAN  Completed   COVID-19 Vaccine  Completed   Hepatitis C Screening  Completed   HPV VACCINES  Aged Out    Health Maintenance  There are no preventive care reminders to display for this patient.  Lung Cancer Screening: (Low Dose CT Chest recommended if Age 54-80 years, 30 pack-year currently smoking OR have quit w/in 15years.) does not qualify.   Vision Screening: Recommended annual ophthalmology exams for early detection of glaucoma and other disorders of the eye. Is the patient up to date with their annual eye exam?  Yes    Dental Screening: Recommended annual dental exams for proper oral hygiene  Community Resource Referral / Chronic Care Management: CRR required this visit?  No   CCM required this visit?  No      Plan:   Keep all routine maintenance appointments.   I have personally reviewed and noted the following in the patients chart:   Medical and social history Use of alcohol, tobacco or illicit drugs  Current medications and supplements including opioid prescriptions. Patient is not currently taking opioid prescriptions. Functional ability and status Nutritional status Physical activity Advanced directives List of other physicians Hospitalizations, surgeries, and ER visits in previous 12 months Vitals Screenings to include cognitive, depression, and falls Referrals and appointments  In addition, I have reviewed and discussed with patient certain preventive protocols, quality metrics, and best practice recommendations. A written personalized care plan for preventive services as well as general preventive health recommendations were provided to patient.     Varney Biles, LPN   01/03/1833

## 2022-01-10 ENCOUNTER — Telehealth: Payer: HMO

## 2022-01-21 ENCOUNTER — Ambulatory Visit (INDEPENDENT_AMBULATORY_CARE_PROVIDER_SITE_OTHER): Payer: HMO | Admitting: Pharmacist

## 2022-01-21 DIAGNOSIS — I10 Essential (primary) hypertension: Secondary | ICD-10-CM

## 2022-01-21 DIAGNOSIS — E1169 Type 2 diabetes mellitus with other specified complication: Secondary | ICD-10-CM

## 2022-01-21 DIAGNOSIS — J45909 Unspecified asthma, uncomplicated: Secondary | ICD-10-CM

## 2022-01-21 DIAGNOSIS — Z794 Long term (current) use of insulin: Secondary | ICD-10-CM

## 2022-01-21 DIAGNOSIS — E785 Hyperlipidemia, unspecified: Secondary | ICD-10-CM

## 2022-01-21 MED ORDER — METFORMIN HCL ER 500 MG PO TB24
1000.0000 mg | ORAL_TABLET | Freq: Two times a day (BID) | ORAL | 1 refills | Status: DC
Start: 2022-01-21 — End: 2022-07-28

## 2022-01-21 MED ORDER — DAPAGLIFLOZIN PROPANEDIOL 10 MG PO TABS: 10.0000 mg | ORAL_TABLET | Freq: Every day | ORAL | 1 refills | Status: DC

## 2022-01-21 MED ORDER — BREZTRI AEROSPHERE 160-9-4.8 MCG/ACT IN AERO: 2.0000 | INHALATION_SPRAY | Freq: Two times a day (BID) | RESPIRATORY_TRACT | 3 refills | Status: DC

## 2022-01-21 NOTE — Patient Instructions (Signed)
Kamaryn,   Keep up the great work!  We recommend the Shingrix (shingles) vaccine series for all over age 73. It should have a $0 copay on all Medicare plans this year. You can pursue this without a prescription at your local pharmacy, or feel free to call our [place] at [facility] at [phone].  For prescription refills through the [employer] Prescription Savings Program [place]), call [phone]. Once you have been enrolled in the program, your prescriptions can easily be refilled by contacting the phone number above Monday though Friday 9:00 AM - 6:00 PM.   The [employer] Ad[employer]age Formulary can be found at [email or web address]  Talk to Dr. Saunders Revel to see if there is an appropriate alternative that is Tier 1.   Take care!  Catie Darnelle Maffucci, PharmD  Visit Information  Following are the goals we discussed today:  Patient Goals/Self-Care Activities Over the next 90 days, patient will:  - take medications as prescribed check glucose daily, document, and provide at future appointments collaborate with provider on medication access solutions          Plan: Telephone follow up appointment with care management team member scheduled for:  5 months   Catie Darnelle Maffucci, PharmD, [place], CPP Clinical Pharmacist [place] [facility] [phone]   Please call the care guide team at [phone] if you need to cancel or reschedule your appointment.   Print copy of patient instructions, educational materials, and care plan provided in person.

## 2022-01-21 NOTE — Chronic Care Management (AMB) (Signed)
Chronic Care Management CCM Pharmacy Note  01/21/2022 Name:  Vanessa Higgins MRN:  [id number] DOB:  Jun 18, 1951  Summary: - [place] through 2023.  - Reports verapamil CR is now Tier 3  - Reports some periodic diarrhea - Due to reapply for Ozempic assistance  Recommendations/Changes made from today's visit: - She will follow up with cardiology regarding cost-effective alternative to verpamil CR - Switching to ER metformin  - Will reapply for Ozempic assistance from [employer]  Subjective: Vanessa Higgins is an 71 y.o. year old female who is a primary patient of Burnard Hawthorne, FNP.  The CCM team was consulted for assistance with disease management and care coordination needs.    Engaged with patient by telephone for follow up visit for pharmacy case management and/or care coordination services.   Objective:  Medications Reviewed Today     Reviewed by De Hollingshead, RPH-CPP (Pharmacist) on 01/21/22 at [id number]  Med List Status: <None>   Medication Order Taking? Sig Documenting Provider Last Dose Status Informant  albuterol (PROVENTIL HFA;VENTOLIN HFA) 108 (90 Base) MCG/ACT inhaler [id number] Yes Inhale 2 puffs into the lungs every 6 (six) hours as needed for wheezing or shortness of breath. [provider] Taking Active   atorvastatin (LIPITOR) 20 MG tablet [id number] Yes TAKE 1 TABLET BY MOUTH EVERYDAY AT BEDTIME Burnard Hawthorne, FNP Taking Active   Budeson-Glycopyrrol-Formoterol (BREZTRI AEROSPHERE) 160-9-4.8 MCG/ACT Hollie Salk [id number] Yes Inhale 2 puffs into the lungs in the morning and at bedtime. Burnard Hawthorne, FNP Taking Active   Cholecalciferol (VITAMIN D) 125 MCG (5000 UT) CAPS [id number] Yes Take 5,000 Units by mouth daily. [provider] Taking Active   dapagliflozin propanediol (FARXIGA) 10 MG TABS tablet [id number] Yes Take 1 tablet (10 mg total) by mouth daily before breakfast. Burnard Hawthorne, FNP Taking Active            Med Note  Darnelle Maffucci, Waynette Buttery Jan 21, 2022  9:42 AM)    diphenoxylate-atropine (LOMOTIL) 2.5-0.025 MG tablet [id number] Yes Take 1 tablet by mouth every 6 (six) hours as needed for diarrhea or loose stools. Jackquline Denmark, MD Taking Active   estradiol (ESTRACE) 0.1 MG/GM vaginal cream [id number] Yes Place 0.5 g vaginally 2 (two) times a week. Place 0.5g nightly for two weeks then twice a week after Jaquita Folds, MD Taking Active   loratadine (CLARITIN) 10 MG tablet [id number] Yes Take 10 mg by mouth at bedtime.  [provider] Taking Active Self  metFORMIN (GLUCOPHAGE) 1000 MG tablet [id number] Yes Take 1 tablet (1,000 mg total) by mouth 2 (two) times daily with a meal. Burnard Hawthorne, FNP Taking Active   montelukast (SINGULAIR) 10 MG tablet [id number] Yes TAKE 1 TABLET BY MOUTH EVERYDAY AT BEDTIME Burnard Hawthorne, FNP Taking Active   [facility] VERIO test strip [id number]  USE 1 EACH BY OTHER ROUTE AS NEEDED FOR OTHER. FREESTYLE ELITE Burnard Hawthorne, [place]  Active   pantoprazole (PROTONIX) 20 MG tablet [id number] Yes Take 20 mg by mouth daily. [provider] Taking Active   Probiotic Product (PROBIOTIC PO) [id number] Yes Take 1 capsule by mouth daily. [provider]  Active   Semaglutide, 2 MG/DOSE, 8 MG/3ML SOPN [id number] Yes Inject 2 mg as directed once a week. Burnard Hawthorne, FNP Taking Active   telmisartan-hydrochlorothiazide (MICARDIS HCT) 80-25 MG tablet [id number] Yes TAKE 1 TABLET BY MOUTH EVERY DAY Vidal Schwalbe Yvetta Coder, FNP Taking Active   venlafaxine  XR (EFFEXOR-XR) 150 MG 24 hr capsule [id number] Yes TAKE 1 CAPSULE BY MOUTH DAILY WITH BREAKFAST. Burnard Hawthorne, FNP Taking Active   Verapamil HCl CR 300 MG CP24 [id number] Yes TAKE 1 CAPSULE BY MOUTH EVERY DAY Arnett, Yvetta Coder, FNP Taking Active   vitamin B-12 (CYANOCOBALAMIN) 1000 MCG tablet [id number] Yes Take 1,000 mcg by mouth at bedtime. [provider] Taking Active   Zoster Vaccine  Adjuvanted [facility]) injection [id number]  Inject into the muscle.   Active             Pertinent Labs:   Lab Results  Component Value Date   HGBA1C 6.5 (A) 11/20/2021   Lab Results  Component Value Date   CHOL 119 08/15/2021   HDL 43.30 08/15/2021   LDLCALC 58 08/15/2021   TRIG 88.0 08/15/2021   CHOLHDL 3 08/15/2021   Lab Results  Component Value Date   CREATININE 0.71 11/20/2021   BUN 16 11/20/2021   NA 140 11/20/2021   K 3.6 11/20/2021   CL 96 11/20/2021   CO2 32 11/20/2021    SDOH:  (Social Determinants of Health) assessments and interventions performed:  SDOH Interventions    Flowsheet Row Most Recent Value  SDOH Interventions   Financial Strain Interventions Other (Comment)  [manufacturer assistance]       CCM Care Plan  Review of patient past medical history, allergies, medications, health status, including review of consultants reports, laboratory and other test data, was performed as part of comprehensive evaluation and provision of chronic care management services.   Care Plan : Medication Management  Updates made by De Hollingshead, RPH-CPP since 01/21/2022 12:00 AM     Problem: Diabetes, Hypertension, Hyperlipidemia, Pulmonary Disease      Long-Range Goal: Disease Progression Prevention   Start Date: 04/27/2021  Recent Progress: On track  Priority: High  Note:   Current Barriers:  Unable to independently afford treatment regimen  Pharmacist Clinical Goal(s):  Over the next 90 days, patient will verbalize ability to afford treatment regimen Over the next 90 days, patient will achieve control of diabetes as evidenced by A1c through collaboration with PharmD and provider.   Interventions: 1:1 collaboration with Burnard Hawthorne, FNP regarding development and update of comprehensive plan of care as evidenced by provider attestation and co-signature Inter-disciplinary care team collaboration (see longitudinal plan of care) Comprehensive  medication review performed; medication list updated in electronic medical record  Health Maintenance   Yearly diabetic eye exam: up to date Yearly diabetic foot exam: up to date Urine microalbumin: up to date Yearly influenza vaccination: up to date Td/Tdap vaccination: up to date Pneumonia vaccination: up to date COVID vaccinations: up to date Shingrix vaccinations: due - discussed to pursue at local pharmacy Colonoscopy: up to date Bone density scan: up to date Mammogram: up to date   Diabetes: Controlled; current treatment: metformin 1000 mg BID, Farxiga 10 mg daily, Ozempic 2 mg weekly Reports some concerns with diarrhea lately. Reports she thought she was on XR metformin Patient assistance through [employer] through 12/29/21  Switch to XR metformin to reduce risk of diarrhea. Patient aware to take 2 tablets twice daily.  Will collaborate w/ CPhT, patient, and providers to reapply for patient assistance for [place] for 2023. Reiterated sick day dosing for SGLT2  Hypertension: Controlled per home report; current treatment: telmisartan/HCTZ 80/25, verapamil 300 mg;  Reports verapamil is now expensive. Reviewed HTA formulary. Verapamil ER is tier 3. She is due for cardiology  follow up. She will schedule follow up to discuss changing to a more cost effective alternative. Can consider IR verpamil or alternative beta blocking agents.  Recommended to continue current regimen at this time along with cardiology collaboration.   Hyperlipidemia: Controlled per last lipid panel; current treatment: atorvastatin 20 mg Previously recommended to continue current regimen along with cardiology collaboration.   Asthma: Appropriately managed; current treatment: Breztri 160/9/4.8 mcg 2 puffs twice daily (changed from Trelegy d/t cost concerns), montelukast 10 mg daily, loratadine 10 mg daily, albuterol HFA PRN Reviewed refill procedure for [employer]  Recommended to continue  current regimen along with follow up with Dr. Patsey Berthold.   Depression/Anxiety: Controlled per patient report; current regimen: venlafaxine XR 150 mg daily  Previously recommended to continue current regimen at this time.   GERD: Controlled per patient report; current regimen: pantoprazole 20 mg daily  Recommended to continue current regimen along with avoidance of trigger foods.   Supplements: Vitamin B12, colace PRN  Patient Goals/Self-Care Activities Over the next 90 days, patient will:  - take medications as prescribed check glucose daily, document, and provide at future appointments collaborate with provider on medication access solutions       Plan: Telephone follow up appointment with care management team member scheduled for:  5 months  Catie Darnelle Maffucci, PharmD, [place], [place] Clinical Pharmacist [employer] at [employer] [phone]

## 2022-01-23 ENCOUNTER — Telehealth: Payer: Self-pay | Admitting: Pharmacist

## 2022-01-23 NOTE — Telephone Encounter (Signed)
Received patient and provider portion of [employer] applications for [id number] assistance. Submitted today. Will collaborate w/ CPhT to follow up

## 2022-01-24 ENCOUNTER — Telehealth: Payer: Self-pay | Admitting: Pharmacy Technician

## 2022-01-24 DIAGNOSIS — Z596 Low income: Secondary | ICD-10-CM

## 2022-01-24 NOTE — Progress Notes (Signed)
[facility])                                            [place] Team    01/24/2022  Vanessa Higgins 1951/06/07 [id number]                                      Medication Assistance Referral  Referral From: [facility] Embedded RPh Catie T.   Medication/Company: Larna Daughters / [employer] Patient application portion:  N/A signed in clinic with PharmD Provider application portion: Interoffice Mailed to [place], NP Provider address/fax verified via: Office website  Medication/Company: [employer] / [place] Patient application portion:  N/A should automatically be re enrolled per program guidelines Provider application portion: Interoffice Mailed to [place], NP Provider address/fax verified via: Office website  Medication/Company: Judithann Sauger / [place] Patient application portion:  N/A should automatically be re enrolled per program guidelines Provider application portion: Interoffice Mailed to [place], NP Provider address/fax verified via: Office website  Received both patient and provider portion(s) of patient assistance application(s) for [employer], [name] and [name]. [place] completed application and required documents into [employer] and AZ&ME.  Vanessa Higgins, [place]  [phone]

## 2022-01-29 DIAGNOSIS — Z794 Long term (current) use of insulin: Secondary | ICD-10-CM

## 2022-01-29 DIAGNOSIS — E1169 Type 2 diabetes mellitus with other specified complication: Secondary | ICD-10-CM

## 2022-01-29 DIAGNOSIS — I10 Essential (primary) hypertension: Secondary | ICD-10-CM | POA: Diagnosis not present

## 2022-01-29 DIAGNOSIS — J45909 Unspecified asthma, uncomplicated: Secondary | ICD-10-CM

## 2022-01-29 DIAGNOSIS — E785 Hyperlipidemia, unspecified: Secondary | ICD-10-CM | POA: Diagnosis not present

## 2022-02-01 ENCOUNTER — Telehealth: Payer: Self-pay | Admitting: Pharmacy Technician

## 2022-02-01 DIAGNOSIS — Z596 Low income: Secondary | ICD-10-CM

## 2022-02-01 NOTE — Progress Notes (Signed)
[facility])                                            [place] Team    02/01/2022  Vanessa Higgins December 13, 1951 [id number]  Care coordination call placed to AZ&ME in regard to [name] and [facility] application.  Spoke to [place] who informs patient is APPROVED 12/30/21-12/29/22. She informs there are active prescriptions on file for both medications and will auto process and deliver to patient's home based on last fill dates in 2022 and going forward in 2023.  Carrol Bondar P. Samani Deal, [place]  [phone]

## 2022-02-06 ENCOUNTER — Telehealth: Payer: Self-pay | Admitting: Pulmonary Disease

## 2022-02-06 NOTE — Telephone Encounter (Signed)
Lm for patient.  

## 2022-02-07 NOTE — Telephone Encounter (Signed)
Lm x2 for patient.  Will close encounter per office protocol.   

## 2022-02-08 ENCOUNTER — Other Ambulatory Visit: Payer: Self-pay

## 2022-02-08 ENCOUNTER — Telehealth: Payer: Self-pay | Admitting: Pharmacy Technician

## 2022-02-08 ENCOUNTER — Telehealth: Payer: Self-pay | Admitting: Family

## 2022-02-08 DIAGNOSIS — Z596 Low income: Secondary | ICD-10-CM

## 2022-02-08 NOTE — Telephone Encounter (Signed)
Patient was told since she is on Medicare she now needs a written prescription from her provider for her CPAP supplies. Patient said she can pick up prescription next week.

## 2022-02-08 NOTE — Telephone Encounter (Signed)
LMTCB I need more info.

## 2022-02-08 NOTE — Telephone Encounter (Signed)
According to our records, PCP manages cpap. She will contact PCP. Last OV note has been printed and placed up front for pickup.  Nothing further needed at this time.

## 2022-02-08 NOTE — Progress Notes (Signed)
[place] [facility])                                            [place] Team    02/08/2022  Vanessa Higgins December 31, 1950 [id number]   Care coordination call placed to [place] in regard to [place] application.  Spoke to Vanessa Higgins who informs patient is APPROVED 02/06/22-12/29/22. She informs medication will ship out based on last fill date in 2022 and going forward in 2023 with delivery to the provider's office.   Vanessa Vanhouten [name], [place]  [phone]

## 2022-02-12 ENCOUNTER — Other Ambulatory Visit: Payer: Self-pay

## 2022-02-12 NOTE — Telephone Encounter (Signed)
I tried to call patient & it sounded like the call kept dropping. Was unable to reach at home number.

## 2022-02-12 NOTE — Telephone Encounter (Signed)
Pt returning call. Pt requesting callback. Pt stated that she will call back around 4:30pm or 5pm

## 2022-02-12 NOTE — Telephone Encounter (Signed)
LMTCB

## 2022-02-13 NOTE — Telephone Encounter (Signed)
Patient returned office phone call. Please call her at [phone].

## 2022-02-14 ENCOUNTER — Other Ambulatory Visit: Payer: Self-pay

## 2022-02-14 DIAGNOSIS — G4733 Obstructive sleep apnea (adult) (pediatric): Secondary | ICD-10-CM

## 2022-02-14 NOTE — Telephone Encounter (Signed)
I have printed this & placed in your red folder for you to sign.

## 2022-02-19 ENCOUNTER — Telehealth: Payer: Self-pay

## 2022-02-19 NOTE — Telephone Encounter (Signed)
Left message for patient to pick up Ozempic  8mg -53ml pen from patient assistance. Placed in [place]

## 2022-02-20 ENCOUNTER — Ambulatory Visit: Payer: HMO | Admitting: Family

## 2022-03-07 ENCOUNTER — Other Ambulatory Visit: Payer: Self-pay

## 2022-03-07 ENCOUNTER — Ambulatory Visit: Payer: HMO | Admitting: Internal Medicine

## 2022-03-07 ENCOUNTER — Encounter: Payer: Self-pay | Admitting: Internal Medicine

## 2022-03-07 ENCOUNTER — Ambulatory Visit: Payer: Self-pay | Admitting: Pharmacist

## 2022-03-07 VITALS — BP 102/62 | HR 94 | Ht 63.0 in | Wt 171.0 lb

## 2022-03-07 DIAGNOSIS — E1169 Type 2 diabetes mellitus with other specified complication: Secondary | ICD-10-CM | POA: Diagnosis not present

## 2022-03-07 DIAGNOSIS — I10 Essential (primary) hypertension: Secondary | ICD-10-CM | POA: Diagnosis not present

## 2022-03-07 DIAGNOSIS — E785 Hyperlipidemia, unspecified: Secondary | ICD-10-CM | POA: Diagnosis not present

## 2022-03-07 DIAGNOSIS — I493 Ventricular premature depolarization: Secondary | ICD-10-CM | POA: Diagnosis not present

## 2022-03-07 DIAGNOSIS — R002 Palpitations: Secondary | ICD-10-CM | POA: Diagnosis not present

## 2022-03-07 DIAGNOSIS — J45909 Unspecified asthma, uncomplicated: Secondary | ICD-10-CM

## 2022-03-07 MED ORDER — DILTIAZEM HCL ER COATED BEADS 240 MG PO CP24: 240.0000 mg | ORAL_CAPSULE | Freq: Every day | ORAL | 0 refills | Status: DC

## 2022-03-07 NOTE — Chronic Care Management (AMB) (Signed)
?  Chronic Care Management  ? ?Note ? ?03/07/2022 ?Name: Vanessa Higgins MRN: [id number] DOB: 1951/10/21 ? ? ? ?Closing pharmacy CCM case at this time.  Patient has clinic contact information for future questions or concerns.  ? ?Catie Darnelle Maffucci, PharmD, [place], CPP ?Clinical Pharmacist ?Therapist, music at [employer] ?[phone] ? ?

## 2022-03-07 NOTE — Patient Instructions (Signed)
Medication Instructions:  ? ?Your physician has recommended you make the following change in your medication:  ? ?STOP Verapamil ? ?START Diltiazem (Cardizem CD) 240 mg daily  ? ?*If you need a refill on your cardiac medications before your next appointment, please call your pharmacy* ? ? ?Lab Work: ? ?None ordered ? ?Testing/Procedures: ? ?None ordered ? ? ?Follow-Up: ?At [facility], you and your health needs are our priority.  As part of our continuing mission to provide you with exceptional heart care, we have created designated Provider Care Teams.  These Care Teams include your primary Cardiologist (physician) and Advanced Practice Providers (APPs -  Physician Assistants and Nurse Practitioners) who all work together to provide you with the care you need, when you need it. ? ?We recommend signing up for the patient portal called "MyChart".  Sign up information is provided on this After Visit Summary.  MyChart is used to connect with patients for Virtual Visits (Telemedicine).  Patients are able to view lab/test results, encounter notes, upcoming appointments, etc.  Non-urgent messages can be sent to your provider as well.   ?To learn more about what you can do with MyChart, go to [email or web address].   ? ?Your next appointment:   ?3 month(s) ? ?The format for your next appointment:   ?In Person ? ?Provider:   ?You may see [name], MD or one of the following Advanced Practice Providers on your designated Care Team:   ?[name], NP ?[name], PA-C ?[name], PA-C ? ?

## 2022-03-07 NOTE — Progress Notes (Signed)
? ?Follow-up Outpatient Visit ?Date: 03/07/2022 ? ?Primary Care Provider: ?Burnard Hawthorne, FNP ?[phone] ?[place] [address] ? ?Chief Complaint: Follow-up palpitations and PVCs ? ?HPI:  Vanessa Higgins is a 71 y.o. female with history of frequent PVCs, hypertension, hyperlipidemia, type 2 diabetes mellitus, asthma, obstructive sleep apnea, and GERD, who presents for follow-up of palpitations and PVCs.  I last saw her a year ago, at which time she was doing well with only rare "jumps" in her heart rate.  She reported resting better with nightly CPAP use.  We did not make any medication changes or pursue additional testing. ? ?Today, Ms. Arterburn reports that she has been feeling fairly well.  She denies chest pain and shortness of breath.  She has occasional transient palpitations with racing of her heart that lasts a few seconds.  She also notes occasional orthostatic lightheadedness.  She has not passed out or fallen.  Her blood pressure at home is typically 110-115/60-70.  She has not had any edema.  She notes that cost of verapamil recently increased from no co-pay to $150.  She inquires about potential alternatives. ? ?-------------------------------------------------------------------------------------------------- ? ?Cardiovascular History & Procedures: ?Cardiovascular Problems: ?Frequent PVCs ?Shortness of breath ?  ?Risk Factors: ?Hypertension, hyperlipidemia, diabetes mellitus, obesity, and age greater than 90 ?  ?Cath/PCI: ?None ?  ?CV Surgery: ?None ?  ?EP Procedures and Devices: ?Holter monitor (09/19/16): Predominantly sinus rhythm with rare PACs and frequent PVCs (11% burden). Single 5 beat atrial run. ?  ?Non-Invasive Evaluation(s): ?TTE (06/15/20): Normal LV size and wall thickness.  LVEF 60-65% with grade 1 diastolic dysfunction.  Normal RV size and function.  Mild mitral regurgitation. ?TTE (09/19/16): Normal LV size with an EF of 60-65%. Normal wall motion and diastolic function.  Normal RV size and function. No significant valvular abnormalities. ?Pharmacologic MPI (09/06/16): Low risk study without ischemia. LVEF 55%. ? ?Recent CV Pertinent Labs: ?Lab Results  ?Component Value Date  ? CHOL 119 08/15/2021  ? CHOL 105 07/12/2020  ? CHOL 102 06/28/2013  ? HDL 43.30 08/15/2021  ? HDL 41 07/12/2020  ? HDL 44 06/28/2013  ? [place] 58 08/15/2021  ? LDLCALC 49 07/12/2020  ? [place] 42 06/28/2013  ? TRIG 88.0 08/15/2021  ? TRIG 81 06/28/2013  ? CHOLHDL 3 08/15/2021  ? INR 1.07 02/18/2019  ? K 3.6 11/20/2021  ? K 4.2 12/28/2012  ? BUN 16 11/20/2021  ? BUN 16 07/12/2020  ? BUN 16 12/28/2012  ? CREATININE 0.71 11/20/2021  ? CREATININE 0.57 12/02/2014  ? ? ?Past medical and surgical history were reviewed and updated in EPIC. ? ?Current Meds  ?Medication Sig  ? albuterol (PROVENTIL HFA;VENTOLIN HFA) 108 (90 Base) MCG/ACT inhaler Inhale 2 puffs into the lungs every 6 (six) hours as needed for wheezing or shortness of breath.  ? atorvastatin (LIPITOR) 20 MG tablet TAKE 1 TABLET BY MOUTH EVERYDAY AT BEDTIME  ? Budeson-Glycopyrrol-Formoterol (BREZTRI AEROSPHERE) 160-9-4.8 MCG/ACT AERO Inhale 2 puffs into the lungs in the morning and at bedtime.  ? Cholecalciferol (VITAMIN D) 125 MCG (5000 UT) CAPS Take 5,000 Units by mouth daily.  ? dapagliflozin propanediol (FARXIGA) 10 MG TABS tablet Take 1 tablet (10 mg total) by mouth daily before breakfast.  ? diphenoxylate-atropine (LOMOTIL) 2.5-0.025 MG tablet Take 1 tablet by mouth every 6 (six) hours as needed for diarrhea or loose stools.  ? estradiol (ESTRACE) 0.1 MG/GM vaginal cream Place 0.5 g vaginally 2 (two) times a week. Place 0.5g nightly for two  weeks then twice a week after  ? loratadine (CLARITIN) 10 MG tablet Take 10 mg by mouth at bedtime.   ? metFORMIN (GLUCOPHAGE XR) 500 MG 24 hr tablet Take 2 tablets (1,000 mg total) by mouth 2 (two) times daily.  ? montelukast (SINGULAIR) 10 MG tablet TAKE 1 TABLET BY MOUTH EVERYDAY AT BEDTIME  ? ONETOUCH VERIO test  strip USE 1 EACH BY OTHER ROUTE AS NEEDED FOR OTHER. FREESTYLE ELITE  ? pantoprazole (PROTONIX) 20 MG tablet Take 20 mg by mouth daily.  ? Probiotic Product (PROBIOTIC PO) Take 1 capsule by mouth daily.  ? Semaglutide, 2 MG/DOSE, 8 MG/3ML SOPN Inject 2 mg as directed once a week.  ? telmisartan-hydrochlorothiazide (MICARDIS HCT) 80-25 MG tablet TAKE 1 TABLET BY MOUTH EVERY DAY  ? venlafaxine XR (EFFEXOR-XR) 150 MG 24 hr capsule TAKE 1 CAPSULE BY MOUTH DAILY WITH BREAKFAST.  ? Verapamil HCl CR 300 MG CP24 TAKE 1 CAPSULE BY MOUTH EVERY DAY  ? vitamin B-12 (CYANOCOBALAMIN) 1000 MCG tablet Take 1,000 mcg by mouth at bedtime.  ? ? ?Allergies: Sulfa antibiotics, Lisinopril, Other, and Ramipril ? ?Social History  ? ?Tobacco Use  ? Smoking status: Never  ? Smokeless tobacco: Never  ?Vaping Use  ? Vaping Use: Never used  ?Substance Use Topics  ? Alcohol use: No  ? Drug use: No  ? ? ?Family History  ?Problem Relation Age of Onset  ? Sudden death Mother   ? Cancer Mother 74  ?     pancreatic  ? Heart disease Father 71  ? Sudden death Father   ? Cancer Brother   ?     colon  ? Colon cancer Brother 10  ? Stroke Maternal Grandmother   ? Heart disease Maternal Grandfather   ? Cancer Sister   ?     brain  ? Breast cancer Neg Hx   ? Colon polyps Neg Hx   ? Esophageal cancer Neg Hx   ? Rectal cancer Neg Hx   ? Stomach cancer Neg Hx   ? Thyroid cancer Neg Hx   ? ? ?Review of Systems: ?A 12-system review of systems was performed and was negative except as noted in the HPI. ? ?-------------------------------------------------------------------------------------------------- ? ?Physical Exam: ?BP 102/62 (BP Location: Left Arm, Patient Position: Sitting, Cuff Size: Large)   Pulse 94   Ht '5\' 3"'$  (1.6 m)   Wt 171 lb (77.6 kg)   SpO2 95%   BMI 30.29 kg/m?  ? ?General:  NAD. ?Neck: No JVD or HJR. ?Lungs: Clear to auscultation bilaterally without wheezes or crackles. ?Heart: Regular rate and rhythm without murmurs, rubs, or  gallops. ?Abdomen: Soft, nontender, nondistended. ?Extremities: No lower extremity edema. ? ?EKG: Normal sinus rhythm with poor R wave progression.  No significant change from prior tracing on 02/14/2021. ? ?Lab Results  ?Component Value Date  ? WBC 6.8 11/30/2021  ? HGB 14.3 11/30/2021  ? HCT 42.8 11/30/2021  ? MCV 95.0 11/30/2021  ? PLT 243.0 11/30/2021  ? ? ?Lab Results  ?Component Value Date  ? NA 140 11/20/2021  ? K 3.6 11/20/2021  ? CL 96 11/20/2021  ? CO2 32 11/20/2021  ? BUN 16 11/20/2021  ? CREATININE 0.71 11/20/2021  ? GLUCOSE 110 (H) 11/20/2021  ? ALT 25 11/30/2021  ? ? ?Lab Results  ?Component Value Date  ? CHOL 119 08/15/2021  ? HDL 43.30 08/15/2021  ? [place] 58 08/15/2021  ? TRIG 88.0 08/15/2021  ? CHOLHDL 3 08/15/2021  ? ? ?-------------------------------------------------------------------------------------------------- ? ?  ASSESSMENT AND PLAN: ?Palpitations and PVCs: ?Ms. Kelly has occasional brief flutters in her chest but otherwise is asymptomatic.  Given increased cost of verapamil, we discussed potential alternatives including diltiazem and beta-blockers.  Given her underlying obstructive lung disease, I think it would be best to transition to diltiazem, as long as it is cost is also not prohibitive.  We will therefore switch verapamil to diltiazem extended release 240 mg daily. ? ?Asthma: ?Shortness of breath well-controlled with current inhaler regimen.  Continue current medications. ? ?Hypertension: ?Blood pressure borderline low today albeit without symptoms.  As above, we will transition from verapamil 300 mg daily to diltiazem 240 mg daily.  Continue current dose of telmisartan-HCTZ. ? ?Hyperlipidemia associated with type 2 diabetes mellitus: ?Lipids well controlled on last check in 07/2021.  Continue ongoing management of lipids and diabetes her Ms. Arnett. ? ?Follow-up: Return to clinic in 3 months. ? ?[name], MD ?03/07/2022 ?11:06 AM ? ?

## 2022-03-07 NOTE — Patient Instructions (Addendum)
Hi Dezi,  ? ?I am being asked to quickly transition into another role within the health system, so unfortunately I am unable to keep our next appointment. Please continue to follow up with your primary care provider as scheduled.  ? ?As a reminder -  ? ?The [employer] Patient Assistance Program (Ozempic) has an auto-refill program where you do not need to call and request a refill each time. You should receive your medication shipment before you run out of your medication. However, if you have any issues or need assistance, you can call them at [phone]. They are available Monday though Friday 8:00 AM - 8:00 PM.  ? ?The AZ&Me prescription savings program [employer] and [place]) has an auto-refill program where you do not need to call and request a refill each time. You should receive your medication shipment before you run out of your medication. However, if you have any issues or need assistance, you can call them call [phone]. They are available Monday though Friday 9:00 AM - 6:00 PM.  ? ?It has been a pleasure working with you! ? ?Catie Darnelle Maffucci, PharmD ? ?

## 2022-03-12 ENCOUNTER — Ambulatory Visit (INDEPENDENT_AMBULATORY_CARE_PROVIDER_SITE_OTHER): Payer: HMO | Admitting: Family

## 2022-03-12 ENCOUNTER — Encounter: Payer: Self-pay | Admitting: Family

## 2022-03-12 ENCOUNTER — Telehealth: Payer: Self-pay

## 2022-03-12 ENCOUNTER — Other Ambulatory Visit: Payer: Self-pay

## 2022-03-12 VITALS — BP 116/62 | HR 73 | Temp 98.1°F | Ht 61.0 in | Wt 175.2 lb

## 2022-03-12 DIAGNOSIS — E1169 Type 2 diabetes mellitus with other specified complication: Secondary | ICD-10-CM

## 2022-03-12 DIAGNOSIS — I10 Essential (primary) hypertension: Secondary | ICD-10-CM

## 2022-03-12 DIAGNOSIS — E785 Hyperlipidemia, unspecified: Secondary | ICD-10-CM

## 2022-03-12 DIAGNOSIS — Z794 Long term (current) use of insulin: Secondary | ICD-10-CM

## 2022-03-12 LAB — COMPREHENSIVE METABOLIC PANEL
ALT: 17 U/L (ref 0–35)
AST: 18 U/L (ref 0–37)
Albumin: 4.4 g/dL (ref 3.5–5.2)
Alkaline Phosphatase: 33 U/L — ABNORMAL LOW (ref 39–117)
BUN: 36 mg/dL — ABNORMAL HIGH (ref 6–23)
CO2: 30 mEq/L (ref 19–32)
Calcium: 9.7 mg/dL (ref 8.4–10.5)
Chloride: 99 mEq/L (ref 96–112)
Creatinine, Ser: 1.22 mg/dL — ABNORMAL HIGH (ref 0.40–1.20)
GFR: 44.3 mL/min — ABNORMAL LOW (ref >60.00)
Glucose, Bld: 114 mg/dL — ABNORMAL HIGH (ref 70–99)
Potassium: 4.3 mEq/L (ref 3.5–5.1)
Sodium: 140 mEq/L (ref 135–145)
Total Bilirubin: 1.3 mg/dL — ABNORMAL HIGH (ref 0.2–1.2)
Total Protein: 6.8 g/dL (ref 6.0–8.3)

## 2022-03-12 LAB — HEMOGLOBIN A1C: Hgb A1c MFr Bld: 6.8 % — ABNORMAL HIGH (ref 4.6–6.5)

## 2022-03-12 MED ORDER — ONDANSETRON HCL 4 MG PO TABS: 4.0000 mg | ORAL_TABLET | Freq: Three times a day (TID) | ORAL | 0 refills | Status: DC | PRN

## 2022-03-12 NOTE — Telephone Encounter (Signed)
PA for Zofran ?

## 2022-03-12 NOTE — Patient Instructions (Signed)
Decrease ozempic to '1mg'$  ?Let me know if this dose is beter for you and I will refill at this dose.  ? ? ?

## 2022-03-12 NOTE — Progress Notes (Signed)
? ?Subjective:  ? ? Patient ID: Vanessa Higgins, female    DOB: 09/28/1951, 71 y.o.   MRN: [id number] ? ?CC: [name] is a 71 y.o. female who presents today for follow up.  ? ?HPI: Feels well today ?No new complaints ? ? ? ?DM- FBG 110-130, postprandial 160.compliant with metformin 1000 mg BID, Farxiga 10 mg daily, Ozempic 2 mg weekly. She is feeling nauseated from ozempic for several after taking medication. Nausea can be a/w non bloody emesis. She held ozempic with resolution of nausea.  ?No fever, constipation.  ? ?GERD- she is compliant with protonix. No epigastric burning.  ? ?HLD-compliant with atorvastatin '20mg'$  ? ?Depression anxiety-compliant with venlafaxine 50 mg qd ?She had follow-up with cardiology 03/07/2022 for palpitations, PVCs.  She was transitioned to diltiazem 240 mg.  She is no longer on verapamil ?Hypertension-compliant with diltiazem 240 mg, telmisartan-HCTZ 80-25. No cp.  ? ?HISTORY:  ?Past Medical History:  ?Diagnosis Date  ? Allergy   ? hay fever  ? Anemia   ? Ankle fracture, left 04/08/2014  ? Anxiety and depression   ? Arthritis   ? Asthma   ? well controlled   ? Blood transfusion abn reaction or complication, no procedure mishap   ? Blood transfusion without reported diagnosis   ? COVID-19 virus infection 08/2020  ? Depression   ? Diabetes mellitus   ? Disc displacement, lumbar   ? Dysrhythmia   ? pvc's  ? Fatty liver   ? GERD (gastroesophageal reflux disease)   ? Headache(784.0)   ? History of hiatal hernia   ? small  ? Hyperlipidemia   ? Hypertension   ? Joint pain   ? Low vitamin B12 level   ? Low vitamin D level   ? Migraine   ? Mitral valve prolapse   ? Obstructive sleep apnea   ? CPAP  ? Osteoarthritis   ? Palpitations   ? PONV (postoperative nausea and vomiting)   ? h/o in the past  ? Sleep apnea 04/06/2017  ? uses CPAP  ? SOB (shortness of breath)   ? UTI (lower urinary tract infection)   ? Vitamin B 12 deficiency   ? Vitamin D deficiency   ? Wears glasses   ? ?Past Surgical  History:  ?Procedure Laterality Date  ? APPENDECTOMY  1977  ? COLONOSCOPY  last 11/17/2014  ? INGUINAL HERNIA REPAIR Right 11/01/2020  ? Procedure: HERNIA REPAIR INGUINAL ADULT;  Surgeon: Robert Bellow, MD;  Location: [facility];  Service: General;  Laterality: Right;  ? JOINT REPLACEMENT    ? KNEE ARTHROPLASTY Right 03/03/2019  ? Procedure: COMPUTER ASSISTED TOTAL KNEE ARTHROPLASTY-RIGHT;  Surgeon: Dereck Leep, MD;  Location: [facility];  Service: Orthopedics;  Laterality: Right;  ? LUMBAR LAMINECTOMY/DECOMPRESSION MICRODISCECTOMY Right 02/06/2018  ? Procedure: Lumbar two Hemilaminectomy for Discectomy;  Surgeon: Ashok Pall, MD;  Location: [place];  Service: Neurosurgery;  Laterality: Right;  ? ORIF ANKLE FRACTURE Left 04/08/2014  ? Procedure: OPEN REDUCTION INTERNAL FIXATION (ORIF) ANKLE FRACTURE LEFT ANKLE FRACTURE OPEN TREATMENT BIMALLEOLAR ANKLE INCLUDES INTERNAL FIXATION;  Surgeon: Johnny Bridge, MD;  Location: [place];  Service: Orthopedics;  Laterality: Left;  ? PAROTIDECTOMY  1980  ? left  ? POLYPECTOMY    ? TONSILLECTOMY AND ADENOIDECTOMY    ? as a child  ? TOTAL KNEE ARTHROPLASTY Bilateral   ? left  ? TUBAL LIGATION  05/1976  ? ?Family History  ?Problem Relation Age of Onset  ?  Sudden death Mother   ? Cancer Mother 13  ?     pancreatic  ? Heart disease Father 99  ? Sudden death Father   ? Cancer Brother   ?     colon  ? Colon cancer Brother 66  ? Stroke Maternal Grandmother   ? Heart disease Maternal Grandfather   ? Cancer Sister   ?     brain  ? Breast cancer Neg Hx   ? Colon polyps Neg Hx   ? Esophageal cancer Neg Hx   ? Rectal cancer Neg Hx   ? Stomach cancer Neg Hx   ? Thyroid cancer Neg Hx   ? ? ?Allergies: Sulfa antibiotics, Lisinopril, Other, and Ramipril ?Current Outpatient Medications on File Prior to Visit  ?Medication Sig Dispense Refill  ? albuterol (PROVENTIL HFA;VENTOLIN HFA) 108 (90 Base) MCG/ACT inhaler Inhale 2 puffs into the lungs every 6 (six) hours as needed for  wheezing or shortness of breath.    ? atorvastatin (LIPITOR) 20 MG tablet TAKE 1 TABLET BY MOUTH EVERYDAY AT BEDTIME 90 tablet 1  ? Budeson-Glycopyrrol-Formoterol (BREZTRI AEROSPHERE) 160-9-4.8 MCG/ACT AERO Inhale 2 puffs into the lungs in the morning and at bedtime. 32.1 g 3  ? Cholecalciferol (VITAMIN D) 125 MCG (5000 UT) CAPS Take 5,000 Units by mouth daily.    ? dapagliflozin propanediol (FARXIGA) 10 MG TABS tablet Take 1 tablet (10 mg total) by mouth daily before breakfast. 90 tablet 1  ? diltiazem (CARDIZEM CD) 240 MG 24 hr capsule Take 1 capsule (240 mg total) by mouth daily. 90 capsule 0  ? diphenoxylate-atropine (LOMOTIL) 2.5-0.025 MG tablet Take 1 tablet by mouth every 6 (six) hours as needed for diarrhea or loose stools. 30 tablet 1  ? estradiol (ESTRACE) 0.1 MG/GM vaginal cream Place 0.5 g vaginally 2 (two) times a week. Place 0.5g nightly for two weeks then twice a week after 30 g 11  ? loratadine (CLARITIN) 10 MG tablet Take 10 mg by mouth at bedtime.     ? metFORMIN (GLUCOPHAGE XR) 500 MG 24 hr tablet Take 2 tablets (1,000 mg total) by mouth 2 (two) times daily. 360 tablet 1  ? montelukast (SINGULAIR) 10 MG tablet TAKE 1 TABLET BY MOUTH EVERYDAY AT BEDTIME 90 tablet 1  ? ONETOUCH VERIO test strip USE 1 EACH BY OTHER ROUTE AS NEEDED FOR OTHER. FREESTYLE ELITE 100 strip 3  ? pantoprazole (PROTONIX) 20 MG tablet Take 20 mg by mouth daily.    ? Probiotic Product (PROBIOTIC PO) Take 1 capsule by mouth daily.    ? Semaglutide, 2 MG/DOSE, 8 MG/3ML SOPN Inject 2 mg as directed once a week. 6 mL 2  ? telmisartan-hydrochlorothiazide (MICARDIS HCT) 80-25 MG tablet TAKE 1 TABLET BY MOUTH EVERY DAY 90 tablet 1  ? venlafaxine XR (EFFEXOR-XR) 150 MG 24 hr capsule TAKE 1 CAPSULE BY MOUTH DAILY WITH BREAKFAST. 90 capsule 1  ? vitamin B-12 (CYANOCOBALAMIN) 1000 MCG tablet Take 1,000 mcg by mouth at bedtime.    ? ?No current facility-administered medications on file prior to visit.  ? ? ?Social History  ? ?Tobacco Use   ? Smoking status: Never  ? Smokeless tobacco: Never  ?Vaping Use  ? Vaping Use: Never used  ?Substance Use Topics  ? Alcohol use: No  ? Drug use: No  ? ? ?Review of Systems  ?Constitutional:  Negative for chills and fever.  ?Respiratory:  Negative for cough.   ?Cardiovascular:  Negative for chest pain and palpitations.  ?Gastrointestinal:  Negative for nausea (resolved) and vomiting.  ?   ?Objective:  ?  ?BP 116/62 (BP Location: Left Arm, Patient Position: Sitting, Cuff Size: Normal)   Pulse 73   Temp 98.1 ?F (36.7 ?C) (Oral)   Ht '5\' 1"'$  (1.549 m)   Wt 175 lb 3.2 oz (79.5 kg)   SpO2 94%   BMI 33.10 kg/m?  ?BP Readings from Last 3 Encounters:  ?03/12/22 116/62  ?03/07/22 102/62  ?12/10/21 118/70  ? ?Wt Readings from Last 3 Encounters:  ?03/12/22 175 lb 3.2 oz (79.5 kg)  ?03/07/22 171 lb (77.6 kg)  ?01/07/22 185 lb (83.9 kg)  ? ? ?Physical Exam ?Vitals reviewed.  ?Constitutional:   ?   Appearance: She is well-developed.  ?Eyes:  ?   Conjunctiva/sclera: Conjunctivae normal.  ?Cardiovascular:  ?   Rate and Rhythm: Normal rate and regular rhythm.  ?   Pulses: Normal pulses.  ?   Heart sounds: Normal heart sounds.  ?Pulmonary:  ?   Effort: Pulmonary effort is normal.  ?   Breath sounds: Normal breath sounds. No wheezing, rhonchi or rales.  ?Skin: ?   General: Skin is warm and dry.  ?Neurological:  ?   Mental Status: She is alert.  ?Psychiatric:     ?   Speech: Speech normal.     ?   Behavior: Behavior normal.     ?   Thought Content: Thought content normal.  ? ? ?   ?Assessment & Plan:  ? ?Problem List Items Addressed This Visit   ? ?  ? Cardiovascular and Mediastinum  ? Essential hypertension  ?  Chronic, stable.  Continue diltiazem 240 mg, telmisartan-HCTZ 80-'25mg'$  ?  ?  ?  ? Endocrine  ? Diabetes mellitus ([place]) - Primary  ?  Lab Results  ?Component Value Date  ? HGBA1C 6.8 (H) 03/12/2022  ?Excellent control. Nausea resolved off of ozempic this week. We agreed to trial ozempic lower dose '1mg'$  to see if nausea  returns prior to changing to another drug class. Continue metformin 1000 mg BID, Farxiga 10 mg daily. ?  ?  ? Relevant Medications  ? ondansetron (ZOFRAN) 4 MG tablet  ? Other Relevant Orders  ? Comprehensive metabolic pan

## 2022-03-13 NOTE — Assessment & Plan Note (Addendum)
Chronic, stable. Continue diltiazem 240 mg, telmisartan-HCTZ 80-25mg 

## 2022-03-13 NOTE — Assessment & Plan Note (Signed)
Excellent control.  LDL less than 70. Continue atorvastatin '20mg'$  ?

## 2022-03-13 NOTE — Assessment & Plan Note (Signed)
Lab Results  ?Component Value Date  ? HGBA1C 6.8 (H) 03/12/2022  ? ?Excellent control. Nausea resolved off of ozempic this week. We agreed to trial ozempic lower dose '1mg'$  to see if nausea returns prior to changing to another drug class. Continue metformin 1000 mg BID, Farxiga 10 mg daily. ?

## 2022-03-14 ENCOUNTER — Telehealth: Payer: Self-pay | Admitting: Family

## 2022-03-14 NOTE — Telephone Encounter (Signed)
Pt has a lab appt 03/19/2022, there are no orders in. ?

## 2022-03-15 NOTE — Telephone Encounter (Signed)
See result note and order lab per result note 03/12/22 ?

## 2022-03-19 ENCOUNTER — Other Ambulatory Visit: Payer: Self-pay

## 2022-03-19 ENCOUNTER — Encounter: Payer: Self-pay | Admitting: Family

## 2022-03-19 ENCOUNTER — Telehealth: Payer: Self-pay | Admitting: Family

## 2022-03-19 ENCOUNTER — Other Ambulatory Visit: Payer: Self-pay | Admitting: Family

## 2022-03-19 ENCOUNTER — Other Ambulatory Visit (INDEPENDENT_AMBULATORY_CARE_PROVIDER_SITE_OTHER): Payer: HMO

## 2022-03-19 DIAGNOSIS — G4733 Obstructive sleep apnea (adult) (pediatric): Secondary | ICD-10-CM | POA: Diagnosis not present

## 2022-03-19 DIAGNOSIS — I10 Essential (primary) hypertension: Secondary | ICD-10-CM

## 2022-03-19 NOTE — Telephone Encounter (Signed)
Pt has a lab appt today at 2:30pm and there are no orders in. ?

## 2022-03-20 LAB — BASIC METABOLIC PANEL
BUN: 27 mg/dL — ABNORMAL HIGH (ref 6–23)
CO2: 31 mEq/L (ref 19–32)
Calcium: 10 mg/dL (ref 8.4–10.5)
Chloride: 100 mEq/L (ref 96–112)
Creatinine, Ser: 0.91 mg/dL (ref 0.40–1.20)
GFR: 62.97 mL/min (ref >60.00)
Glucose, Bld: 88 mg/dL (ref 70–99)
Potassium: 3.9 mEq/L (ref 3.5–5.1)
Sodium: 142 mEq/L (ref 135–145)

## 2022-03-20 NOTE — Telephone Encounter (Signed)
Labs completed

## 2022-03-26 ENCOUNTER — Other Ambulatory Visit: Payer: Self-pay | Admitting: Family

## 2022-03-26 DIAGNOSIS — E119 Type 2 diabetes mellitus without complications: Secondary | ICD-10-CM

## 2022-03-27 ENCOUNTER — Telehealth: Payer: Self-pay | Admitting: Family

## 2022-03-27 NOTE — Telephone Encounter (Signed)
Call patient ?Received order form for CPAP supplies.  What is her Pap pressure setting? ?

## 2022-03-27 NOTE — Telephone Encounter (Signed)
Spoke to patient she stated that she does not change settings it is Automatic ?

## 2022-04-04 ENCOUNTER — Other Ambulatory Visit: Payer: Self-pay | Admitting: Family

## 2022-04-14 ENCOUNTER — Other Ambulatory Visit: Payer: Self-pay | Admitting: Family

## 2022-04-14 DIAGNOSIS — J45909 Unspecified asthma, uncomplicated: Secondary | ICD-10-CM

## 2022-04-26 ENCOUNTER — Telehealth: Payer: Self-pay | Admitting: Family

## 2022-04-26 NOTE — Telephone Encounter (Signed)
Pt called stating she is out of her ozempic and want to know if we have any samples  ?

## 2022-04-30 ENCOUNTER — Other Ambulatory Visit: Payer: Self-pay

## 2022-04-30 DIAGNOSIS — Z794 Long term (current) use of insulin: Secondary | ICD-10-CM

## 2022-04-30 MED ORDER — SEMAGLUTIDE (2 MG/DOSE) 8 MG/3ML ~~LOC~~ SOPN: 2.0000 mg | PEN_INJECTOR | SUBCUTANEOUS | 2 refills | Status: DC

## 2022-04-30 NOTE — Telephone Encounter (Signed)
Call pt ?Please refill medication ?We dont have ozempic '2mg'$  ?

## 2022-04-30 NOTE — Telephone Encounter (Signed)
Spoke to patient and informed her that we do not have any '2mg'$  samples of Ozempic in  office but I would be refilling hers today and pharmacy would let her know when it was ready for pickup ?

## 2022-05-09 ENCOUNTER — Telehealth: Payer: HMO

## 2022-06-01 ENCOUNTER — Other Ambulatory Visit: Payer: Self-pay | Admitting: Family

## 2022-06-01 DIAGNOSIS — I10 Essential (primary) hypertension: Secondary | ICD-10-CM

## 2022-06-10 ENCOUNTER — Ambulatory Visit: Payer: HMO | Admitting: Pulmonary Disease

## 2022-06-12 ENCOUNTER — Ambulatory Visit: Payer: HMO | Admitting: Family

## 2022-06-12 NOTE — Progress Notes (Signed)
Cardiology Office Note:    Date:  06/13/2022   ID:  CHERELL COLVIN, DOB 1951/12/28, MRN [id number]  PCP:  Burnard Hawthorne, FNP  [facility] Cardiologist:  [name], MD  [facility] Electrophysiologist:  None   Referring MD: Burnard Hawthorne, FNP   Chief Complaint: 3 month follow-up  History of Present Illness:    Vanessa Higgins is a 71 y.o. female with a hx of frequent PVCs, HTN, HLD, DM2, asthma, OSA, GERD who presents for 3 month follow-up.   She underwent Holter monitoring 08/2016 that showed sinus rhythm with ventricular ectopy.  08/2016 stress testing was without significant ischemia, EF 55%, and ruled low risk. Echo 05/2020 for SOB showed LVEF 60-65%, no WMA, G1DD, mild MR.   Last seen 03/07/22 and reported she was feeling well. Noted transient palpitations. Verapamil was switched to diltiazem '240mg'$  daily.   Today, the patient reports she is doing well. She has very rare palpitations on diltiazem. BP is well controlled. No dizziness or lightheadedness. PCP followed diabetes. No chest pain, SOB, LLE, orthopnea, or pnd.   Past Medical History:  Diagnosis Date   Allergy    hay fever   Anemia    Ankle fracture, left 04/08/2014   Anxiety and depression    Arthritis    Asthma    well controlled    Blood transfusion abn reaction or complication, no procedure mishap    Blood transfusion without reported diagnosis    COVID-19 virus infection 08/2020   Depression    Diabetes mellitus    Disc displacement, lumbar    Dysrhythmia    pvc's   Fatty liver    GERD (gastroesophageal reflux disease)    Headache(784.0)    History of hiatal hernia    small   Hyperlipidemia    Hypertension    Joint pain    Low vitamin B12 level    Low vitamin D level    Migraine    Mitral valve prolapse    Obstructive sleep apnea    CPAP   Osteoarthritis    Palpitations    PONV (postoperative nausea and vomiting)    h/o in the past   Sleep apnea 04/06/2017   uses CPAP   SOB  (shortness of breath)    UTI (lower urinary tract infection)    Vitamin B 12 deficiency    Vitamin D deficiency    Wears glasses     Past Surgical History:  Procedure Laterality Date   APPENDECTOMY  1977   COLONOSCOPY  last 11/17/2014   INGUINAL HERNIA REPAIR Right 11/01/2020   Procedure: HERNIA REPAIR INGUINAL ADULT;  Surgeon: Robert Bellow, MD;  Location: [facility];  Service: General;  Laterality: Right;   JOINT REPLACEMENT     KNEE ARTHROPLASTY Right 03/03/2019   Procedure: COMPUTER ASSISTED TOTAL KNEE ARTHROPLASTY-RIGHT;  Surgeon: Dereck Leep, MD;  Location: [facility];  Service: Orthopedics;  Laterality: Right;   LUMBAR LAMINECTOMY/DECOMPRESSION MICRODISCECTOMY Right 02/06/2018   Procedure: Lumbar two Hemilaminectomy for Discectomy;  Surgeon: Ashok Pall, MD;  Location: [place];  Service: Neurosurgery;  Laterality: Right;   ORIF ANKLE FRACTURE Left 04/08/2014   Procedure: OPEN REDUCTION INTERNAL FIXATION (ORIF) ANKLE FRACTURE LEFT ANKLE FRACTURE OPEN TREATMENT BIMALLEOLAR ANKLE INCLUDES INTERNAL FIXATION;  Surgeon: Johnny Bridge, MD;  Location: [place];  Service: Orthopedics;  Laterality: Left;   PAROTIDECTOMY  1980   left   POLYPECTOMY     TONSILLECTOMY AND ADENOIDECTOMY  as a child   TOTAL KNEE ARTHROPLASTY Bilateral    left   TUBAL LIGATION  05/1976    Current Medications: Current Meds  Medication Sig   albuterol (PROVENTIL HFA;VENTOLIN HFA) 108 (90 Base) MCG/ACT inhaler Inhale 2 puffs into the lungs every 6 (six) hours as needed for wheezing or shortness of breath.   atorvastatin (LIPITOR) 20 MG tablet TAKE 1 TABLET BY MOUTH EVERYDAY AT BEDTIME   Budeson-Glycopyrrol-Formoterol (BREZTRI AEROSPHERE) 160-9-4.8 MCG/ACT AERO Inhale 2 puffs into the lungs in the morning and at bedtime.   Cholecalciferol (VITAMIN D) 125 MCG (5000 UT) CAPS Take 5,000 Units by mouth daily.   dapagliflozin propanediol (FARXIGA) 10 MG TABS tablet Take 1 tablet (10 mg total)  by mouth daily before breakfast.   diltiazem (CARDIZEM CD) 240 MG 24 hr capsule Take 240 mg by mouth daily.   diphenoxylate-atropine (LOMOTIL) 2.5-0.025 MG tablet Take 1 tablet by mouth every 6 (six) hours as needed for diarrhea or loose stools.   loratadine (CLARITIN) 10 MG tablet Take 10 mg by mouth at bedtime.    metFORMIN (GLUCOPHAGE XR) 500 MG 24 hr tablet Take 2 tablets (1,000 mg total) by mouth 2 (two) times daily.   montelukast (SINGULAIR) 10 MG tablet TAKE 1 TABLET BY MOUTH EVERYDAY AT BEDTIME   ondansetron (ZOFRAN) 4 MG tablet Take 1 tablet (4 mg total) by mouth every 8 (eight) hours as needed for nausea or vomiting.   ONETOUCH VERIO test strip USE 1 EACH BY OTHER ROUTE AS NEEDED FOR OTHER. FREESTYLE ELITE   pantoprazole (PROTONIX) 20 MG tablet Take 20 mg by mouth daily.   Probiotic Product (PROBIOTIC PO) Take 1 capsule by mouth daily.   Semaglutide, 2 MG/DOSE, 8 MG/3ML SOPN Inject 2 mg as directed once a week.   telmisartan-hydrochlorothiazide (MICARDIS HCT) 80-25 MG tablet TAKE 1 TABLET BY MOUTH EVERY DAY   venlafaxine XR (EFFEXOR-XR) 150 MG 24 hr capsule TAKE 1 CAPSULE BY MOUTH DAILY WITH BREAKFAST.   vitamin B-12 (CYANOCOBALAMIN) 1000 MCG tablet Take 1,000 mcg by mouth at bedtime.     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 Husband deceased Mar 19, 2008.      Work - [place]  [employer]- [employer], [employer]   Diet - healthy   Exercise - gym 2 x per week   Social Determinants of Health   Financial Resource Strain: Medium Risk (01/21/2022)   Overall Financial Resource Strain (CARDIA)    Difficulty of Paying Living Expenses: Somewhat hard  Food Insecurity: No Food Insecurity (01/07/2022)   Hunger Vital Sign    Worried About Running Out of Food in the Last Year: Never true    Ran Out of Food in the Last Year: Never true  Transportation Needs: No Transportation Needs (01/07/2022)   PRAPARE -  Hydrologist (Medical): No    Lack of Transportation (Non-Medical): No  Physical Activity: Not on file  Stress: No Stress Concern Present (01/07/2022)   [place]    Feeling of Stress : Not at all  Social Connections: Unknown (01/07/2022)   Social Connection and Isolation Panel [NHANES]    Frequency of Communication with Friends and Family: More than three times a week    Frequency of Social Gatherings with Friends and Family: More than three times a week    Attends Religious Services: Not on Advertising copywriter or Organizations: Not on file    Attends Archivist Meetings: Not on file    Marital Status: Not on file     Family History: The patient's family history includes Cancer in her brother and sister; Cancer (age of onset: 24) in her mother; Colon cancer (age of onset: 56) in her brother; Heart disease in her maternal grandfather; Heart disease (age of onset: 24) in her father; Stroke in her maternal grandmother; Sudden death in her father and mother. There is no history of Breast cancer, Colon polyps, Esophageal cancer, Rectal cancer, Stomach cancer, or Thyroid cancer.  ROS:   Please see the history of present illness.     All other systems reviewed and are negative.  EKGs/Labs/Other Studies Reviewed:    The following studies were reviewed today:  Echo  05/2020  1. Left ventricular ejection fraction, by estimation, is 60 to 65%. The  left ventricle has normal function. The left ventricle has no regional  wall motion abnormalities. Left ventricular diastolic parameters are  consistent with Grade I diastolic  dysfunction (impaired relaxation).   2. Right ventricular systolic function is normal. The right ventricular  size is normal.   3. The mitral valve is normal in structure. Mild mitral valve  regurgitation.   4. The aortic valve was not well visualized. Aortic valve regurgitation  is mild to moderate.   EKG:  EKG is  ordered today.  The ekg ordered today demonstrates NSR 76bpm, PRI 77m, nonspecific T wave changes  Recent Labs: 11/30/2021: Hemoglobin 14.3; Platelets 243.0; TSH 0.51 03/12/2022: ALT 17 03/19/2022: BUN 27; Creatinine, Ser 0.91; Potassium 3.9; Sodium 142  Recent Lipid Panel    Component Value Date/Time   CHOL 119 08/15/2021 0805   CHOL 105 07/12/2020 1451   CHOL 102 06/28/2013 0711   TRIG 88.0 08/15/2021 0805   TRIG 81 06/28/2013 0711   HDL 43.30 08/15/2021 0805   HDL 41 07/12/2020 1451   HDL 44 06/28/2013 0711   CHOLHDL 3 08/15/2021 0805   VLDL 17.6 08/15/2021 0805   VLDL 16 06/28/2013 0711   LDLCALC 58 08/15/2021 0805   LDLCALC 49 07/12/2020 1451   LDLCALC 42 06/28/2013 0711      Physical Exam:    VS:  BP 108/62   Pulse 76   Ht '5\' 3"'$  (1.6 m)   Wt 178 lb 9.6 oz (81 kg)   SpO2 95%   BMI 31.64 kg/m     Wt Readings from Last 3 Encounters:  06/13/22 178 lb 9.6 oz (81 kg)  03/12/22 175 lb 3.2 oz (79.5 kg)  03/07/22 171 lb (77.6 kg)     GEN:  Well nourished, well developed in no acute distress HEENT: Normal NECK: No JVD; No carotid bruits LYMPHATICS: No lymphadenopathy CARDIAC: RRR, no murmurs, rubs, gallops RESPIRATORY:  Clear to auscultation without  rales, wheezing or rhonchi  ABDOMEN: Soft, non-tender, non-distended MUSCULOSKELETAL:  No edema; No deformity  SKIN: Warm and dry NEUROLOGIC:   Alert and oriented x 3 PSYCHIATRIC:  Normal affect   ASSESSMENT:    1. PVC's (premature ventricular contractions)   2. Palpitations   3. Essential hypertension   4. Hyperlipidemia, mixed    PLAN:    In order of problems listed above:  Palpitations/PVCs Patient reports very rare palpitations on diltiazem '240mg'$  daily. Cannot increase diltiazem due to low BP in the past. EKG today shows NSR with no PVCs. No further work-up at this time.   HTN BP is good today. Continue diltiazem '240mg'$  daily.   HLD Lipid panel a year ago chol 119, TG 88, HDL 43, LDL 58 . PCP is following. Continue Lipitor '20mg'$  daily.   Disposition: Follow up in 6 month(s) with MD/APP      Signed, Sanjay Broadfoot Ninfa Meeker, PA-C  06/13/2022 3:53 PM     [facility]

## 2022-06-13 ENCOUNTER — Ambulatory Visit: Payer: HMO | Admitting: Medical

## 2022-06-13 ENCOUNTER — Encounter: Payer: Self-pay | Admitting: Medical

## 2022-06-13 ENCOUNTER — Ambulatory Visit: Payer: HMO | Admitting: Internal Medicine

## 2022-06-13 VITALS — BP 108/62 | HR 76 | Ht 63.0 in | Wt 178.6 lb

## 2022-06-13 DIAGNOSIS — E782 Mixed hyperlipidemia: Secondary | ICD-10-CM

## 2022-06-13 DIAGNOSIS — R002 Palpitations: Secondary | ICD-10-CM | POA: Diagnosis not present

## 2022-06-13 DIAGNOSIS — I493 Ventricular premature depolarization: Secondary | ICD-10-CM

## 2022-06-13 DIAGNOSIS — I10 Essential (primary) hypertension: Secondary | ICD-10-CM

## 2022-06-13 NOTE — Patient Instructions (Signed)
Medication Instructions:  Your physician recommends that you continue on your current medications as directed. Please refer to the Current Medication list given to you today.  *If you need a refill on your cardiac medications before your next appointment, please call your pharmacy*   Lab Work: None ordered   If you have labs (blood work) drawn today and your tests are completely normal, you will receive your results only by: Vanessa Higgins (if you have MyChart) OR A paper copy in the mail If you have any lab test that is abnormal or we need to change your treatment, we will call you to review the results.   Testing/Procedures: None ordered    Follow-Up: At [facility], you and your health needs are our priority.  As part of our continuing mission to provide you with exceptional heart care, we have created designated Provider Care Teams.  These Care Teams include your primary Cardiologist (physician) and Advanced Practice Providers (APPs -  Physician Assistants and Nurse Practitioners) who all work together to provide you with the care you need, when you need it.  We recommend signing up for the patient portal called "MyChart".  Sign up information is provided on this After Visit Summary.  MyChart is used to connect with patients for Virtual Visits (Telemedicine).  Patients are able to view lab/test results, encounter notes, upcoming appointments, etc.  Non-urgent messages can be sent to your provider as well.   To learn more about what you can do with MyChart, go to [email or web address].    Your next appointment:   6 month(s)  The format for your next appointment:   In Person  Provider:   You may see [name], MD or one of the following Advanced Practice Providers on your designated Care Team:   [name], NP [name], PA-C [name], [place]    Other Instructions   Important Information About Sugar

## 2022-06-18 ENCOUNTER — Other Ambulatory Visit: Payer: Self-pay | Admitting: Gastroenterology

## 2022-06-18 DIAGNOSIS — G4733 Obstructive sleep apnea (adult) (pediatric): Secondary | ICD-10-CM | POA: Diagnosis not present

## 2022-06-18 DIAGNOSIS — K219 Gastro-esophageal reflux disease without esophagitis: Secondary | ICD-10-CM

## 2022-06-25 ENCOUNTER — Other Ambulatory Visit: Payer: Self-pay | Admitting: Internal Medicine

## 2022-07-05 ENCOUNTER — Telehealth: Payer: Self-pay | Admitting: Family

## 2022-07-05 NOTE — Telephone Encounter (Signed)
Please call notify patient t  [employer] patient assistance program sent letter to tell us refill of Ozempic will be arriving here in 10-14 business days.  This prescription will expire 11/28/2022.  Please asked patient to call us in 10 to 14 days to see if prescription is here

## 2022-07-05 NOTE — Telephone Encounter (Signed)
Lvm TO CALL BACK TO OFFICE

## 2022-07-08 ENCOUNTER — Telehealth: Payer: Self-pay

## 2022-07-08 NOTE — Telephone Encounter (Signed)
LVM to inform patient that her Ozempic from patient assistance is here and she can pick up at anytime before 5pm.

## 2022-07-09 NOTE — Telephone Encounter (Signed)
LVM to inform patient that [employer] patient assistance program sent letter to tell us refill of Ozempic will be arriving here in 10-14 business days.  This prescription will expire 11/28/2022.  Please asked patient to call us in 10 to 14 days to see if prescription is here

## 2022-07-10 ENCOUNTER — Telehealth: Payer: Self-pay | Admitting: Family

## 2022-07-10 ENCOUNTER — Ambulatory Visit (INDEPENDENT_AMBULATORY_CARE_PROVIDER_SITE_OTHER): Payer: HMO | Admitting: Family

## 2022-07-10 ENCOUNTER — Encounter: Payer: Self-pay | Admitting: Family

## 2022-07-10 VITALS — BP 120/60 | HR 78 | Temp 97.8°F | Ht 66.0 in | Wt 175.2 lb

## 2022-07-10 DIAGNOSIS — E1169 Type 2 diabetes mellitus with other specified complication: Secondary | ICD-10-CM

## 2022-07-10 DIAGNOSIS — Z794 Long term (current) use of insulin: Secondary | ICD-10-CM

## 2022-07-10 DIAGNOSIS — R519 Headache, unspecified: Secondary | ICD-10-CM | POA: Diagnosis not present

## 2022-07-10 DIAGNOSIS — I10 Essential (primary) hypertension: Secondary | ICD-10-CM

## 2022-07-10 LAB — POCT GLYCOSYLATED HEMOGLOBIN (HGB A1C): Hemoglobin A1C: 6.4 % — AB (ref 4.0–5.6)

## 2022-07-10 NOTE — Assessment & Plan Note (Signed)
New symptom. Age > 37. Reassuring exam today and complete resolution of symptoms. Concern for TIA, mass, aneurysm. Ordered MRI brain, MRA Head. She will stay vigilant and let me know of any recurrence of symptom.

## 2022-07-10 NOTE — Assessment & Plan Note (Signed)
Lab Results  Component Value Date   HGBA1C 6.4 (A) 07/10/2022   Improved. Intolerant to ozempic. Stop ozempic. Continue metformin 1000 mg BID, Farxiga 10 mg daily.

## 2022-07-10 NOTE — Assessment & Plan Note (Signed)
Chronic, stable. Continue diltiazem 240 mg, telmisartan-HCTZ 80-'25mg'$ 

## 2022-07-10 NOTE — Progress Notes (Signed)
Subjective:    Patient ID: Vanessa Higgins, female    DOB: 1951/10/30, 71 y.o.   MRN: [id number]  CC: Vanessa Higgins is a 71 y.o. female who presents today for follow up.   HPI: Complains of 'cymbal beating in her head' on the left side near ear x episode x 3 days ago. It was constant.  No exacerbating or relieving features. Symptom was not a ringing nor painful. It was a pulsing sensation. No fever, congestion, facial pain, vision changes, hearing changes, tinnitus, hearing loss, dizziness, vertigo, palpitations.   No h/o HA.   2 days ago, she vomiting 4-5 times, then resolved with zofran.   Yesterday, complete resolution of symptoms and she has felt well since   Episode didn't remind her previous vertigo.   [place]  HTN- compliant with diltiazem 240 mg, telmisartan-HCTZ 80-'25mg'$    DM- compliant metformin 1000 mg BID, Farxiga 10 mg daily. She stopped ozempic due to nausea, vomiting.   Follow-up cardiology 06/13/2022 for frequent PVCs, hypertension HISTORY:  Past Medical History:  Diagnosis Date   Allergy    hay fever   Anemia    Ankle fracture, left 04/08/2014   Anxiety and depression    Arthritis    Asthma    well controlled    Blood transfusion abn reaction or complication, no procedure mishap    Blood transfusion without reported diagnosis    COVID-19 virus infection 08/2020   Depression    Diabetes mellitus    Disc displacement, lumbar    Dysrhythmia    pvc's   Fatty liver    GERD (gastroesophageal reflux disease)    Headache(784.0)    History of hiatal hernia    small   Hyperlipidemia    Hypertension    Joint pain    Low vitamin B12 level    Low vitamin D level    Migraine    Mitral valve prolapse    Obstructive sleep apnea    CPAP   Osteoarthritis    Palpitations    PONV (postoperative nausea and vomiting)    h/o in the past   Sleep apnea 04/06/2017   uses CPAP   SOB (shortness of breath)    UTI (lower urinary tract  infection)    Vitamin B 12 deficiency    Vitamin D deficiency    Wears glasses    Past Surgical History:  Procedure Laterality Date   APPENDECTOMY  1977   COLONOSCOPY  last 11/17/2014   INGUINAL HERNIA REPAIR Right 11/01/2020   Procedure: HERNIA REPAIR INGUINAL ADULT;  Surgeon: Robert Bellow, MD;  Location: [facility];  Service: General;  Laterality: Right;   JOINT REPLACEMENT     KNEE ARTHROPLASTY Right 03/03/2019   Procedure: COMPUTER ASSISTED TOTAL KNEE ARTHROPLASTY-RIGHT;  Surgeon: Dereck Leep, MD;  Location: [facility];  Service: Orthopedics;  Laterality: Right;   LUMBAR LAMINECTOMY/DECOMPRESSION MICRODISCECTOMY Right 02/06/2018   Procedure: Lumbar two Hemilaminectomy for Discectomy;  Surgeon: Ashok Pall, MD;  Location: [place];  Service: Neurosurgery;  Laterality: Right;   ORIF ANKLE FRACTURE Left 04/08/2014   Procedure: OPEN REDUCTION INTERNAL FIXATION (ORIF) ANKLE FRACTURE LEFT ANKLE FRACTURE OPEN TREATMENT BIMALLEOLAR ANKLE INCLUDES INTERNAL FIXATION;  Surgeon: Johnny Bridge, MD;  Location: [place];  Service: Orthopedics;  Laterality: Left;   PAROTIDECTOMY  1980   left   POLYPECTOMY     TONSILLECTOMY AND ADENOIDECTOMY     as a child  TOTAL KNEE ARTHROPLASTY Bilateral    left   TUBAL LIGATION  05/1976   Family History  Problem Relation Age of Onset   Sudden death Mother    Cancer Mother 62       pancreatic   Heart disease Father 86   Sudden death Father    Cancer Sister 95       brain   Cancer Brother        colon   Colon cancer Brother 43   Stroke Maternal Grandmother    Heart disease Maternal Grandfather    Aneurysm Paternal Aunt    Breast cancer Neg Hx    Colon polyps Neg Hx    Esophageal cancer Neg Hx    Rectal cancer Neg Hx    Stomach cancer Neg Hx    Thyroid cancer Neg Hx     Allergies: Sulfa antibiotics, Lisinopril, Other, and Ramipril Current Outpatient Medications on File Prior to Visit  Medication Sig Dispense Refill    albuterol (PROVENTIL HFA;VENTOLIN HFA) 108 (90 Base) MCG/ACT inhaler Inhale 2 puffs into the lungs every 6 (six) hours as needed for wheezing or shortness of breath.     atorvastatin (LIPITOR) 20 MG tablet TAKE 1 TABLET BY MOUTH EVERYDAY AT BEDTIME 90 tablet 1   Budeson-Glycopyrrol-Formoterol (BREZTRI AEROSPHERE) 160-9-4.8 MCG/ACT AERO Inhale 2 puffs into the lungs in the morning and at bedtime. 32.1 g 3   Cholecalciferol (VITAMIN D) 125 MCG (5000 UT) CAPS Take 5,000 Units by mouth daily.     dapagliflozin propanediol (FARXIGA) 10 MG TABS tablet Take 1 tablet (10 mg total) by mouth daily before breakfast. 90 tablet 1   diltiazem (CARDIZEM CD) 240 MG 24 hr capsule TAKE 1 CAPSULE BY MOUTH EVERY DAY 90 capsule 2   diphenoxylate-atropine (LOMOTIL) 2.5-0.025 MG tablet Take 1 tablet by mouth every 6 (six) hours as needed for diarrhea or loose stools. 30 tablet 1   loratadine (CLARITIN) 10 MG tablet Take 10 mg by mouth at bedtime.      metFORMIN (GLUCOPHAGE XR) 500 MG 24 hr tablet Take 2 tablets (1,000 mg total) by mouth 2 (two) times daily. 360 tablet 1   montelukast (SINGULAIR) 10 MG tablet TAKE 1 TABLET BY MOUTH EVERYDAY AT BEDTIME 90 tablet 1   ondansetron (ZOFRAN) 4 MG tablet Take 1 tablet (4 mg total) by mouth every 8 (eight) hours as needed for nausea or vomiting. 20 tablet 0   ONETOUCH VERIO test strip USE 1 EACH BY OTHER ROUTE AS NEEDED FOR OTHER. FREESTYLE ELITE 100 strip 3   pantoprazole (PROTONIX) 20 MG tablet TAKE 1 TABLET BY MOUTH EVERY DAY 90 tablet 2   Probiotic Product (PROBIOTIC PO) Take 1 capsule by mouth daily.     telmisartan-hydrochlorothiazide (MICARDIS HCT) 80-25 MG tablet TAKE 1 TABLET BY MOUTH EVERY DAY 90 tablet 1   venlafaxine XR (EFFEXOR-XR) 150 MG 24 hr capsule TAKE 1 CAPSULE BY MOUTH DAILY WITH BREAKFAST. 90 capsule 1   vitamin B-12 (CYANOCOBALAMIN) 1000 MCG tablet Take 1,000 mcg by mouth at bedtime.     No current facility-administered medications on file prior to visit.     Social History   Tobacco Use   Smoking status: Never   Smokeless tobacco: Never  Vaping Use   Vaping Use: Never used  Substance Use Topics   Alcohol use: No   Drug use: No    Review of Systems  Constitutional:  Negative for chills and fever.  HENT:  Negative for congestion.  Eyes:  Negative for visual disturbance.  Respiratory:  Negative for cough.   Cardiovascular:  Negative for chest pain and palpitations.  Gastrointestinal:  Negative for nausea and vomiting.  Neurological:  Positive for headaches. Negative for numbness.      Objective:    BP 120/60 (BP Location: Left Arm, Patient Position: Sitting, Cuff Size: Normal)   Pulse 78   Temp 97.8 F (36.6 C) (Oral)   Ht '5\' 6"'$  (1.676 m)   Wt 175 lb 3.2 oz (79.5 kg)   SpO2 94%   BMI 28.28 kg/m  BP Readings from Last 3 Encounters:  07/10/22 120/60  06/13/22 108/62  03/12/22 116/62   Wt Readings from Last 3 Encounters:  07/10/22 175 lb 3.2 oz (79.5 kg)  06/13/22 178 lb 9.6 oz (81 kg)  03/12/22 175 lb 3.2 oz (79.5 kg)    Physical Exam Vitals reviewed.  Constitutional:      Appearance: She is well-developed.  HENT:     Head: Normocephalic and atraumatic.     Right Ear: Hearing, tympanic membrane, ear canal and external ear normal. No swelling or tenderness. No middle ear effusion. Tympanic membrane is not erythematous or bulging.     Left Ear: Tympanic membrane, ear canal and external ear normal. No swelling or tenderness.  No middle ear effusion. Tympanic membrane is not erythematous or bulging.     Nose: Nose normal. No rhinorrhea.     Right Sinus: No maxillary sinus tenderness or frontal sinus tenderness.     Left Sinus: No maxillary sinus tenderness or frontal sinus tenderness.     Mouth/Throat:     Pharynx: Uvula midline. No posterior oropharyngeal erythema.  Eyes:     General: Lids are normal. Lids are everted, no foreign bodies appreciated.     Conjunctiva/sclera: Conjunctivae normal.     Pupils: Pupils  are equal, round, and reactive to light.     Comments: Normal fundus bilaterally   Neck:     Vascular: No carotid bruit.  Cardiovascular:     Rate and Rhythm: Normal rate and regular rhythm.     Pulses: Normal pulses.     Heart sounds: Normal heart sounds.  Pulmonary:     Effort: Pulmonary effort is normal.     Breath sounds: Normal breath sounds. No wheezing, rhonchi or rales.  Lymphadenopathy:     Head:     Right side of head: No submental, submandibular, tonsillar, preauricular, posterior auricular or occipital adenopathy.     Left side of head: No submental, submandibular, tonsillar, preauricular, posterior auricular or occipital adenopathy.     Cervical: No cervical adenopathy.     Right cervical: No superficial, deep or posterior cervical adenopathy.    Left cervical: No superficial, deep or posterior cervical adenopathy.  Skin:    General: Skin is warm and dry.  Neurological:     Mental Status: She is alert.     Cranial Nerves: No cranial nerve deficit.     Sensory: No sensory deficit.     Deep Tendon Reflexes:     Reflex Scores:      Bicep reflexes are 2+ on the right side and 2+ on the left side.      Patellar reflexes are 2+ on the right side and 2+ on the left side.    Comments: Grip equal and strong bilateral upper extremities. Gait strong and steady. Able to perform  finger-to-nose without difficulty.   Psychiatric:        Speech: Speech normal.  Behavior: Behavior normal.        Thought Content: Thought content normal.        Assessment & Plan:   Problem List Items Addressed This Visit       Cardiovascular and Mediastinum   Essential hypertension    Chronic, stable. Continue diltiazem 240 mg, telmisartan-HCTZ 80-'25mg'$         Endocrine   Diabetes mellitus ([place])    Lab Results  Component Value Date   HGBA1C 6.4 (A) 07/10/2022  Improved. Intolerant to ozempic. Stop ozempic. Continue metformin 1000 mg BID, Farxiga 10 mg daily.       Relevant  Orders   POCT HgB A1C (Completed)     Other   Headache - Primary    New symptom. Age > 28. Reassuring exam today and complete resolution of symptoms. Concern for TIA, mass, aneurysm. Ordered MRI brain, MRA Head. She will stay vigilant and let me know of any recurrence of symptom.       Relevant Orders   US Carotid Duplex Bilateral   MR Angiogram Head Wo Contrast   MR BRAIN W WO CONTRAST     I have discontinued Natalee S. Fish's Semaglutide (2 MG/DOSE). I am also having her maintain her loratadine, albuterol, vitamin B-12, Vitamin D, OneTouch Verio, diphenoxylate-atropine, Probiotic Product (PROBIOTIC PO), metFORMIN, dapagliflozin propanediol, Breztri Aerosphere, ondansetron, venlafaxine XR, atorvastatin, montelukast, telmisartan-hydrochlorothiazide, pantoprazole, and diltiazem.   No orders of the defined types were placed in this encounter.   Return precautions given.   Risks, benefits, and alternatives of the medications and treatment plan prescribed today were discussed, and patient expressed understanding.   Education regarding symptom management and diagnosis given to patient on AVS.  Continue to follow with Burnard Hawthorne, FNP for routine health maintenance.   [name] and I agreed with plan.   Mable Paris, FNP

## 2022-07-10 NOTE — Telephone Encounter (Signed)
Lft pt vm to call ofc on both numbers. thanks

## 2022-07-11 ENCOUNTER — Telehealth: Payer: Self-pay | Admitting: Family

## 2022-07-11 NOTE — Telephone Encounter (Signed)
Lft pt vm on both numbers. thanks

## 2022-07-11 NOTE — Telephone Encounter (Signed)
Spoke to patient on 07/10/22 in regards to her Patient assistance meds. Patient stated that she was not going to accept it because it was making her very nauseous therefore she does not want it.

## 2022-07-12 ENCOUNTER — Telehealth: Payer: Self-pay | Admitting: Family

## 2022-07-12 NOTE — Telephone Encounter (Signed)
Lft pt vm on both numbers. thanks

## 2022-07-15 ENCOUNTER — Ambulatory Visit: Payer: HMO | Admitting: Family

## 2022-07-23 ENCOUNTER — Ambulatory Visit
Admission: RE | Admit: 2022-07-23 | Discharge: 2022-07-23 | Disposition: A | Discharge status: Home / Self Care | Payer: HMO | Source: Ambulatory Visit | Attending: Family | Admitting: Family

## 2022-07-23 DIAGNOSIS — I671 Cerebral aneurysm, nonruptured: Secondary | ICD-10-CM | POA: Diagnosis not present

## 2022-07-23 DIAGNOSIS — R519 Headache, unspecified: Secondary | ICD-10-CM

## 2022-07-23 MED ORDER — GADOBUTROL 1 MMOL/ML IV SOLN
7.5000 mL | Freq: Once | INTRAVENOUS | Status: AC | PRN
  Administered 2022-07-23: 7.5 mL via INTRAVENOUS

## 2022-07-28 ENCOUNTER — Other Ambulatory Visit: Payer: Self-pay | Admitting: Internal Medicine

## 2022-07-28 DIAGNOSIS — E1169 Type 2 diabetes mellitus with other specified complication: Secondary | ICD-10-CM

## 2022-08-02 ENCOUNTER — Other Ambulatory Visit: Payer: Self-pay | Admitting: Family

## 2022-08-02 ENCOUNTER — Telehealth: Payer: Self-pay | Admitting: Family

## 2022-08-02 DIAGNOSIS — I671 Cerebral aneurysm, nonruptured: Secondary | ICD-10-CM | POA: Insufficient documentation

## 2022-08-02 NOTE — Telephone Encounter (Signed)
Patient returned office phone call. You can reach patient at home.

## 2022-08-05 NOTE — Telephone Encounter (Signed)
LVM to call back to go over results of MRA head/MRI brain

## 2022-08-05 NOTE — Telephone Encounter (Signed)
Patient returned call - she stated she will be home this afternoon if you are able to call back.

## 2022-08-05 NOTE — Telephone Encounter (Signed)
Call  pt  Please review result note from MRA head/ MRI brain with her. I have copied below  Specifically ask about left ear pain, left headache.  If she has questions, let me know   Hi Vanessa Higgins,    Your MRI brain is negative for acute process such as infarct . 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 There is also left mastoid effusion, trace. Are you having any pain behind your left ear or around your ear over the mastoid process? If so, I would start augmentin ( antibiotic) and refer you to [facility] N[facility].    I have placed a referral to [facility] in [place], Dr Zomorodi regarding the aneursym.  Let us know if you dont hear back within a week in regards to an appointment being scheduled.    Let me know about left ear pain, headache which would warrant antibiotic.    Joycelyn Schmid

## 2022-08-07 ENCOUNTER — Encounter (INDEPENDENT_AMBULATORY_CARE_PROVIDER_SITE_OTHER): Payer: Self-pay

## 2022-08-07 NOTE — Telephone Encounter (Signed)
Patient returned call and I transferred her to Jenate [place], [place].

## 2022-08-07 NOTE — Telephone Encounter (Signed)
Spoke to patient about the MRA/Head and MRI/Brain and went over the results in detail and asked patient if she understood everything that we went over and patient Verbalized understanding and I also asked her to let me know if she had not heard from [facility] within a week or so to give [name] a call back and I would try to reach out to them

## 2022-08-12 ENCOUNTER — Telehealth: Payer: Self-pay | Admitting: Family

## 2022-08-12 NOTE — Telephone Encounter (Signed)
Patient is calling to let you know that she hadn't head anything back from [facility] as of today,  so she called [phone] and they told her that she will need a referral from provider.  The fax number is [phone].

## 2022-08-13 NOTE — Telephone Encounter (Signed)
Spoke to patient about her message and informed her that the referral was placed on 08/02/22

## 2022-08-19 ENCOUNTER — Encounter: Payer: Self-pay | Admitting: Pulmonary Disease

## 2022-08-19 ENCOUNTER — Ambulatory Visit: Payer: HMO | Admitting: Pulmonary Disease

## 2022-08-19 VITALS — BP 100/50 | HR 84 | Temp 97.9°F | Ht 63.0 in | Wt 180.2 lb

## 2022-08-19 DIAGNOSIS — J4540 Moderate persistent asthma, uncomplicated: Secondary | ICD-10-CM

## 2022-08-19 DIAGNOSIS — Z23 Encounter for immunization: Secondary | ICD-10-CM

## 2022-08-19 DIAGNOSIS — J449 Chronic obstructive pulmonary disease, unspecified: Secondary | ICD-10-CM | POA: Diagnosis not present

## 2022-08-19 NOTE — Progress Notes (Signed)
Subjective:    Patient ID: Vanessa Higgins, female    DOB: [birth year], 71 y.o.   MRN: [id number]  Patient Care Team: Burnard Hawthorne, FNP as PCP - General (Family Medicine) End, Harrell Gave, MD as PCP - Cardiology (Cardiology)  Chief Complaint  Patient presents with   Follow-up   HPI Vanessa Higgins is a 71 year old lifelong never smoker who presents for follow-up of asthma with chronic asthmatic bronchitis.  This is a scheduled visit.  We last saw her on 12/10/2021.  She has been on [place] due to insurance not covering Trelegy.  She is actually doing well with this medication.  She has not had any asthmatic bronchitis exacerbations since last seen.  She did have an episode of laryngitis and enlarged lymph nodes after her last COVID booster 2 weeks ago but other than that she has not had any difficulties. She has not had any increase in shortness of breath, cough or wheezing.  She has not had to use her rescue inhaler at all.  She continues to prefer the Breztri to the Trelegy as it keeps her controlled throughout the day. Does not endorse any fevers, chills or sweats.  No hoarseness.  Cough is very rare and usually nonproductive but for the most part this symptom is well controlled.  No lower extremity edema.  No orthopnea or paroxysmal nocturnal dyspnea.  Overall, she feels well and looks well.    She is not certain if Singulair really helps her much at all.  We discussed discontinuing this medication after the fall as she does have tendency to have issues with ragweed sensitivities.  She is to get flu vaccine next month.  We reviewed her immunization data.  She would benefit from Prevnar 20.  She wishes to proceed with this.  DATA 11/01/2021 PFTs: FEV1 1.25 L or 59% predicted, FVC 2.23 L or 79% predicted, FEV1/FVC 56%.  No significant bronchodilator response.  Lung volumes show mild hyperinflation and air trapping.  There is significant reduction in airway resistance postbronchodilator.  Diffusion  capacity normal.  Compared to PFTs 05/12/2020, no significant change.  Review of Systems A 10 point review of systems was performed and it is as noted above otherwise negative.  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   Depression, major, single episode, complete remission ([place]) 11/18/2018   HNP (herniated nucleus pulposus), lumbar 02/06/2018   Fatty liver disease, nonalcoholic 62/83/1517   GERD (gastroesophageal reflux disease) 10/15/2017   PVC's (premature ventricular contractions) 08/01/2017   OSA (obstructive sleep apnea) 04/21/2017   Vertigo 03/26/2017   Elevated liver enzymes 12/03/2016   Cough 06/05/2016   Irregular heart rate 06/05/2016   Vitamin D deficiency 12/02/2014   IDA (iron deficiency anemia) 10/20/2014   Routine general medical examination at a health care facility 05/03/2013   Diabetes mellitus ([place]) 07/09/2012   Essential hypertension 07/09/2012   Hyperlipidemia LDL goal <70 07/09/2012   Asthma  07/09/2012   Class 1 obesity due to excess calories with body mass index (BMI) of 33.0 to 33.9 in adult 07/09/2012   Social History   Tobacco Use   Smoking status: Never   Smokeless tobacco: Never  Substance Use Topics   Alcohol use: No   Allergies  Allergen Reactions   Sulfa Antibiotics Hives, Shortness Of Breath and Rash   Lisinopril Cough   Other Other (See Comments) and Nausea And  Vomiting   Ramipril Cough   Current Meds  Medication Sig   albuterol (PROVENTIL HFA;VENTOLIN HFA) 108 (90 Base) MCG/ACT inhaler Inhale 2 puffs into the lungs every 6 (six) hours as needed for wheezing or shortness of breath.   atorvastatin (LIPITOR) 20 MG tablet TAKE 1 TABLET BY MOUTH EVERYDAY AT BEDTIME   Budeson-Glycopyrrol-Formoterol (BREZTRI AEROSPHERE) 160-9-4.8 MCG/ACT AERO Inhale 2 puffs into the lungs in the morning and at bedtime.   Cholecalciferol (VITAMIN D) 125 MCG (5000 UT) CAPS Take 5,000 Units by mouth daily.   dapagliflozin propanediol (FARXIGA) 10 MG TABS tablet Take 1 tablet (10 mg total) by mouth daily before breakfast.   diltiazem (CARDIZEM CD) 240 MG 24 hr capsule TAKE 1 CAPSULE BY MOUTH EVERY DAY   diphenoxylate-atropine (LOMOTIL) 2.5-0.025 MG tablet Take 1 tablet by mouth every 6 (six) hours as needed for diarrhea or loose stools.   loratadine (CLARITIN) 10 MG tablet Take 10 mg by mouth at bedtime.    metFORMIN (GLUCOPHAGE-XR) 500 MG 24 hr tablet TAKE 2 TABLETS BY MOUTH TWICE A DAY   montelukast (SINGULAIR) 10 MG tablet TAKE 1 TABLET BY MOUTH EVERYDAY AT BEDTIME   ondansetron (ZOFRAN) 4 MG tablet Take 1 tablet (4 mg total) by mouth every 8 (eight) hours as needed for nausea or vomiting.   ONETOUCH VERIO test strip USE 1 EACH BY OTHER ROUTE AS NEEDED FOR OTHER. FREESTYLE ELITE   pantoprazole (PROTONIX) 20 MG tablet TAKE 1 TABLET BY MOUTH EVERY DAY   Probiotic Product (PROBIOTIC PO) Take 1 capsule by mouth daily.   telmisartan-hydrochlorothiazide (MICARDIS HCT) 80-25 MG tablet TAKE 1 TABLET BY MOUTH EVERY DAY   venlafaxine XR (EFFEXOR-XR) 150 MG 24 hr capsule TAKE 1 CAPSULE BY MOUTH DAILY WITH BREAKFAST.   vitamin B-12 (CYANOCOBALAMIN) 1000 MCG tablet Take 1,000 mcg by mouth at bedtime.   Immunization History  Administered Date(s) Administered   Fluad Quad(high Dose 65+) 10/02/2019   Influenza-Unspecified 09/02/2014, 10/02/2015, 09/28/2020, 09/28/2021   MMR 07/09/2010    PFIZER Comirnaty(Gray Top)Covid-19 Tri-Sucrose Vaccine 12/02/2019, 01/04/2020, 12/14/2020, 06/01/2021   PFIZER(Purple Top)SARS-COV-2 Vaccination 12/02/2019, 01/04/2020, 12/14/2020   Pneumococcal Conjugate-13 12/02/2014   Pneumococcal Polysaccharide-23 07/09/2001, 12/04/2016   Td 07/09/2010   Tdap 08/22/2021   Zoster Recombinat (Shingrix) 06/29/2022   Zoster, Live 10/27/2013       Objective:   Physical Exam BP (!) 100/50 (BP Location: Left Arm, Patient Position: Sitting, Cuff Size: Large)   Pulse 84   Temp 97.9 F (36.6 C) (Oral)   Ht $R'5\' 3"'az$  (1.6 m)   Wt 180 lb 3.2 oz (81.7 kg)   SpO2 96%   BMI 31.92 kg/m  GENERAL: This is a well-developed obese woman in no acute respiratory distress.  She is fully ambulatory. No conversational dyspnea. HEAD: Normocephalic, atraumatic. EYES: Pupils equal, round, reactive to light.  No scleral icterus. MOUTH: Nose/mouth/throat not examined due to masking requirements for COVID 19. NECK: Supple. No thyromegaly. No nodules. No JVD.  Trachea midline.  No crepitus. PULMONARY: Symmetrical air entry.  Moving air well.  No adventitious sounds. CARDIOVASCULAR: S1 and S2. Regular rate and rhythm.  No rubs murmurs gallops heard. GASTROINTESTINAL: Obese abdomen, soft. MUSCULOSKELETAL: No joint deformity, no clubbing, no edema.  Increased AP diameter. NEUROLOGIC: No focal deficits noted.  Speech is fluent.  Awake, alert, oriented. SKIN: Intact,warm,dry, limited exam: No rashes. PSYCH: Normal mood and behavior.     Assessment & Plan:     ICD-10-CM   1. Moderate persistent asthma without complication  Q56.72    Well compensated in this regard with Judithann Sauger We will try to wean off Singulair during winter months Continue as needed albuterol.     2. Asthmatic bronchitis , chronic (HCC)  J44.9    Continue Breztri 2 puffs twice a day Compensated    3. Need for pneumococcal 20-valent conjugate vaccination  Z23    Patient received SPZZCKI 21 today She is  to get flu vaccine next month      Orders Placed This Encounter  Procedures   Pneumococcal conjugate vaccine 20-valent ([id number])    Patient appears to be doing well.  She received Prevnar 20 today.  We will continue Breztri 2 puffs twice a day.  Will give a trial off of Singulair once the fall season is over.  We will see her in follow-up in 6 months time call sooner should any new problems arise.   Renold Don, MD Advanced Bronchoscopy P[facility]    *This note was dictated using voice recognition software/Dragon.  Despite best efforts to proofread, errors can occur which can change the meaning. Any transcriptional errors that result from this process are unintentional and may not be fully corrected at the time of dictation.

## 2022-08-19 NOTE — Patient Instructions (Signed)
We gave your Prevnar 20 today.  Continue Breztri.  Try being off of the Singulair and let us know how this does for you.  Try this at the beginning of winter.  See you in follow-up in 6 months time.

## 2022-08-20 ENCOUNTER — Ambulatory Visit
Admission: RE | Admit: 2022-08-20 | Discharge: 2022-08-20 | Disposition: A | Discharge status: Home / Self Care | Payer: HMO | Source: Ambulatory Visit | Attending: Family | Admitting: Family

## 2022-08-20 DIAGNOSIS — R519 Headache, unspecified: Secondary | ICD-10-CM | POA: Diagnosis not present

## 2022-08-20 DIAGNOSIS — I6523 Occlusion and stenosis of bilateral carotid arteries: Secondary | ICD-10-CM | POA: Diagnosis not present

## 2022-08-29 ENCOUNTER — Other Ambulatory Visit: Payer: Self-pay | Admitting: Family

## 2022-08-29 DIAGNOSIS — Z1231 Encounter for screening mammogram for malignant neoplasm of breast: Secondary | ICD-10-CM

## 2022-09-09 DIAGNOSIS — I671 Cerebral aneurysm, nonruptured: Secondary | ICD-10-CM | POA: Diagnosis not present

## 2022-09-18 ENCOUNTER — Other Ambulatory Visit: Payer: Self-pay | Admitting: Family

## 2022-10-01 ENCOUNTER — Telehealth: Payer: Self-pay | Admitting: Pulmonary Disease

## 2022-10-01 NOTE — Telephone Encounter (Signed)
Lm for patient.  

## 2022-10-02 NOTE — Telephone Encounter (Signed)
Lm x2 for patient.  Will close encounter per office protocol.  Nothing further needed.  

## 2022-10-03 ENCOUNTER — Telehealth: Payer: Self-pay | Admitting: Pulmonary Disease

## 2022-10-03 ENCOUNTER — Other Ambulatory Visit: Payer: Self-pay | Admitting: Family

## 2022-10-03 NOTE — Telephone Encounter (Signed)
Rx sent pt notified 

## 2022-10-03 NOTE — Telephone Encounter (Signed)
Lm x1 for patient.  

## 2022-10-04 MED ORDER — ALBUTEROL SULFATE HFA 108 (90 BASE) MCG/ACT IN AERS: 2.0000 | INHALATION_SPRAY | Freq: Four times a day (QID) | RESPIRATORY_TRACT | 2 refills | Status: DC | PRN

## 2022-10-04 NOTE — Telephone Encounter (Signed)
Lm x2 for patient.  Will close encounter per office protocol.  Patient will need to obtain RSV vaccine from local pharmacy.  Ventolin has been sent to [facility].  Nothing further needed at this time.

## 2022-10-15 ENCOUNTER — Ambulatory Visit: Payer: HMO | Admitting: Family

## 2022-10-15 ENCOUNTER — Ambulatory Visit
Admission: RE | Admit: 2022-10-15 | Discharge: 2022-10-15 | Disposition: A | Discharge status: Home / Self Care | Payer: HMO | Source: Ambulatory Visit | Attending: Family | Admitting: Family

## 2022-10-15 DIAGNOSIS — Z1231 Encounter for screening mammogram for malignant neoplasm of breast: Secondary | ICD-10-CM | POA: Insufficient documentation

## 2022-10-16 ENCOUNTER — Other Ambulatory Visit: Payer: Self-pay | Admitting: Family

## 2022-10-16 DIAGNOSIS — J45909 Unspecified asthma, uncomplicated: Secondary | ICD-10-CM

## 2022-10-21 ENCOUNTER — Encounter: Payer: Self-pay | Admitting: *Deleted

## 2022-10-22 ENCOUNTER — Other Ambulatory Visit: Payer: Self-pay

## 2022-10-22 MED ORDER — COVID-19 MRNA 2023-2024 VACCINE (COMIRNATY) 0.3 ML INJECTION
0.3000 mL | Freq: Once | INTRAMUSCULAR | 0 refills | Status: AC
  Filled 2022-10-22: qty 0.3, 1d supply, fill #0

## 2022-10-24 ENCOUNTER — Telehealth: Payer: Self-pay

## 2022-10-24 NOTE — Telephone Encounter (Signed)
LVM TO CALL BACK TO OFFICE  

## 2022-10-28 ENCOUNTER — Ambulatory Visit (INDEPENDENT_AMBULATORY_CARE_PROVIDER_SITE_OTHER): Payer: HMO | Admitting: Family

## 2022-10-28 ENCOUNTER — Encounter: Payer: Self-pay | Admitting: Family

## 2022-10-28 VITALS — BP 124/50 | HR 78 | Temp 98.2°F | Ht 63.0 in | Wt 187.0 lb

## 2022-10-28 DIAGNOSIS — E1169 Type 2 diabetes mellitus with other specified complication: Secondary | ICD-10-CM | POA: Diagnosis not present

## 2022-10-28 DIAGNOSIS — F419 Anxiety disorder, unspecified: Secondary | ICD-10-CM | POA: Diagnosis not present

## 2022-10-28 DIAGNOSIS — Z794 Long term (current) use of insulin: Secondary | ICD-10-CM | POA: Diagnosis not present

## 2022-10-28 DIAGNOSIS — I10 Essential (primary) hypertension: Secondary | ICD-10-CM

## 2022-10-28 DIAGNOSIS — E559 Vitamin D deficiency, unspecified: Secondary | ICD-10-CM | POA: Diagnosis not present

## 2022-10-28 DIAGNOSIS — F32A Depression, unspecified: Secondary | ICD-10-CM

## 2022-10-28 LAB — CBC WITH DIFFERENTIAL/PLATELET
Basophils Absolute: 0.1 10*3/uL (ref 0.0–0.1)
Basophils Relative: 0.8 % (ref 0.0–3.0)
Eosinophils Absolute: 0.6 10*3/uL (ref 0.0–0.7)
Eosinophils Relative: 8.2 % — ABNORMAL HIGH (ref 0.0–5.0)
HCT: 42.5 % (ref 36.0–46.0)
Hemoglobin: 14.1 g/dL (ref 12.0–15.0)
Lymphocytes Relative: 28.8 % (ref 12.0–46.0)
Lymphs Abs: 1.9 10*3/uL (ref 0.7–4.0)
MCHC: 33.1 g/dL (ref 30.0–36.0)
MCV: 94.4 fl (ref 78.0–100.0)
Monocytes Absolute: 0.6 10*3/uL (ref 0.1–1.0)
Monocytes Relative: 8.3 % (ref 3.0–12.0)
Neutro Abs: 3.6 10*3/uL (ref 1.4–7.7)
Neutrophils Relative %: 53.9 % (ref 43.0–77.0)
Platelets: 269 10*3/uL (ref 150.0–400.0)
RBC: 4.5 Mil/uL (ref 3.87–5.11)
RDW: 13.1 % (ref 11.5–15.5)
WBC: 6.8 10*3/uL (ref 4.0–10.5)

## 2022-10-28 LAB — LIPID PANEL
Cholesterol: 153 mg/dL (ref 0–200)
HDL: 52.4 mg/dL (ref >39.00)
LDL Cholesterol: 61 mg/dL (ref 0–99)
NonHDL: 100.94
Total CHOL/HDL Ratio: 3
Triglycerides: 200 mg/dL — ABNORMAL HIGH (ref 0.0–149.0)
VLDL: 40 mg/dL (ref 0.0–40.0)

## 2022-10-28 LAB — COMPREHENSIVE METABOLIC PANEL
ALT: 20 U/L (ref 0–35)
AST: 21 U/L (ref 0–37)
Albumin: 4.5 g/dL (ref 3.5–5.2)
Alkaline Phosphatase: 47 U/L (ref 39–117)
BUN: 23 mg/dL (ref 6–23)
CO2: 30 mEq/L (ref 19–32)
Calcium: 10.1 mg/dL (ref 8.4–10.5)
Chloride: 97 mEq/L (ref 96–112)
Creatinine, Ser: 0.75 mg/dL (ref 0.40–1.20)
GFR: 79.08 mL/min (ref >60.00)
Glucose, Bld: 194 mg/dL — ABNORMAL HIGH (ref 70–99)
Potassium: 3.9 mEq/L (ref 3.5–5.1)
Sodium: 138 mEq/L (ref 135–145)
Total Bilirubin: 1.3 mg/dL — ABNORMAL HIGH (ref 0.2–1.2)
Total Protein: 7.4 g/dL (ref 6.0–8.3)

## 2022-10-28 LAB — HEMOGLOBIN A1C: Hgb A1c MFr Bld: 8.4 % — ABNORMAL HIGH (ref 4.6–6.5)

## 2022-10-28 LAB — MICROALBUMIN / CREATININE URINE RATIO
Creatinine,U: 40.9 mg/dL
Microalb Creat Ratio: 2.1 mg/g (ref 0.0–30.0)
Microalb, Ur: 0.9 mg/dL (ref 0.0–1.9)

## 2022-10-28 LAB — VITAMIN D 25 HYDROXY (VIT D DEFICIENCY, FRACTURES): VITD: 52.55 ng/mL (ref 30.00–100.00)

## 2022-10-28 LAB — TSH: TSH: 1.03 u[IU]/mL (ref 0.35–5.50)

## 2022-10-28 MED ORDER — BUSPIRONE HCL 5 MG PO TABS: 5.0000 mg | ORAL_TABLET | Freq: Three times a day (TID) | ORAL | 1 refills | Status: DC | PRN

## 2022-10-28 MED ORDER — RYBELSUS 3 MG PO TABS: 3.0000 mg | ORAL_TABLET | Freq: Every day | ORAL | 0 refills | Status: DC

## 2022-10-28 NOTE — Assessment & Plan Note (Signed)
Suboptimal control.  Continue Effexor 150 mg.  Start BuSpar 5 mg 3 times daily as needed and titrate

## 2022-10-28 NOTE — Progress Notes (Signed)
Subjective:    Patient ID: Vanessa Higgins, female    DOB: 02/26/1951, 71 y.o.   MRN: [id number]  CC: Vanessa Higgins is a 71 y.o. female who presents today for follow up.   HPI: Granddaughter has moved out of her home which has been very hard for her. She misses her. Tearful.  Compliant with Effexor 150 mg which has overall been helpful for her.  She has been on effexor for many years.  Endorses breakthrough anxiety.  No thoughts of hurting herself or anyone else.  She has a strong faith in close friend who she confides in   HTN-compliant with diltiazem 240 mg, telmisartan-hydrochlorothiazide 80-25 mg  Hyperlipidemia-compliant with Lipitor 20 mg  Diabetes-compliant with Farxiga 10 mg, metformin 1041m BID. FBG 179, 182, 2774 2128Tried trulicity, jardiance, victoza, tresiba, ozempic.   No personal or family history of medullary thyroid cancer, multiple endocrine  HISTORY:  Past Medical History:  Diagnosis Date   Allergy    hay fever   Anemia    Ankle fracture, left 04/08/2014   Anxiety and depression    Arthritis    Asthma    well controlled    Blood transfusion abn reaction or complication, no procedure mishap    Blood transfusion without reported diagnosis    COVID-19 virus infection 08/2020   Depression    Diabetes mellitus    Disc displacement, lumbar    Dysrhythmia    pvc's   Fatty liver    GERD (gastroesophageal reflux disease)    Headache(784.0)    History of hiatal hernia    small   Hyperlipidemia    Hypertension    Joint pain    Low vitamin B12 level    Low vitamin D level    Migraine    Mitral valve prolapse    Obstructive sleep apnea    CPAP   Osteoarthritis    Palpitations    PONV (postoperative nausea and vomiting)    h/o in the past   Sleep apnea 04/06/2017   uses CPAP   SOB (shortness of breath)    UTI (lower urinary tract infection)    Vitamin B 12 deficiency    Vitamin D deficiency    Wears glasses    Past Surgical History:  Procedure  Laterality Date   APPENDECTOMY  1977   COLONOSCOPY  last 11/17/2014   INGUINAL HERNIA REPAIR Right 11/01/2020   Procedure: HERNIA REPAIR INGUINAL ADULT;  Surgeon: BRobert Bellow MD;  Location: [facility];  Service: General;  Laterality: Right;   JOINT REPLACEMENT     KNEE ARTHROPLASTY Right 03/03/2019   Procedure: COMPUTER ASSISTED TOTAL KNEE ARTHROPLASTY-RIGHT;  Surgeon: HDereck Leep MD;  Location: [facility];  Service: Orthopedics;  Laterality: Right;   LUMBAR LAMINECTOMY/DECOMPRESSION MICRODISCECTOMY Right 02/06/2018   Procedure: Lumbar two Hemilaminectomy for Discectomy;  Surgeon: CAshok Pall MD;  [place]  Service: Neurosurgery;  Laterality: Right;   ORIF ANKLE FRACTURE Left 04/08/2014   Procedure: OPEN REDUCTION INTERNAL FIXATION (ORIF) ANKLE FRACTURE LEFT ANKLE FRACTURE OPEN TREATMENT BIMALLEOLAR ANKLE INCLUDES INTERNAL FIXATION;  Surgeon: JJohnny Bridge MD;  [place] [facility]  Service: Orthopedics;  Laterality: Left;   PAROTIDECTOMY  1980   left   POLYPECTOMY     TONSILLECTOMY AND ADENOIDECTOMY     as a child   TOTAL KNEE ARTHROPLASTY Bilateral    left   TUBAL LIGATION  05/1976   Family History  Problem Relation Age of Onset  Sudden death Mother    Cancer Mother 3       pancreatic   Heart disease Father 59   Sudden death Father    Cancer Sister 31       brain   Cancer Brother        colon   Colon cancer Brother 34   Stroke Maternal Grandmother    Heart disease Maternal Grandfather    Aneurysm Paternal Aunt    Breast cancer Neg Hx    Colon polyps Neg Hx    Esophageal cancer Neg Hx    Rectal cancer Neg Hx    Stomach cancer Neg Hx    Thyroid cancer Neg Hx     Allergies: Sulfa antibiotics, Lisinopril, Other, and Ramipril Current Outpatient Medications on File Prior to Visit  Medication Sig Dispense Refill   albuterol (VENTOLIN HFA) 108 (90 Base) MCG/ACT inhaler Inhale 2 puffs into the lungs every 6 (six) hours as needed for wheezing  or shortness of breath. 18 g 2   atorvastatin (LIPITOR) 20 MG tablet TAKE 1 TABLET BY MOUTH EVERYDAY AT BEDTIME 90 tablet 1   Budeson-Glycopyrrol-Formoterol (BREZTRI AEROSPHERE) 160-9-4.8 MCG/ACT AERO Inhale 2 puffs into the lungs in the morning and at bedtime. 32.1 g 3   Cholecalciferol (VITAMIN D) 125 MCG (5000 UT) CAPS Take 5,000 Units by mouth daily.     dapagliflozin propanediol (FARXIGA) 10 MG TABS tablet Take 1 tablet (10 mg total) by mouth daily before breakfast. 90 tablet 1   diltiazem (CARDIZEM CD) 240 MG 24 hr capsule TAKE 1 CAPSULE BY MOUTH EVERY DAY 90 capsule 2   diphenoxylate-atropine (LOMOTIL) 2.5-0.025 MG tablet Take 1 tablet by mouth every 6 (six) hours as needed for diarrhea or loose stools. 30 tablet 1   loratadine (CLARITIN) 10 MG tablet Take 10 mg by mouth at bedtime.      metFORMIN (GLUCOPHAGE-XR) 500 MG 24 hr tablet TAKE 2 TABLETS BY MOUTH TWICE A DAY 360 tablet 1   montelukast (SINGULAIR) 10 MG tablet TAKE 1 TABLET BY MOUTH EVERYDAY AT BEDTIME 90 tablet 1   ONETOUCH VERIO test strip USE 1 EACH BY OTHER ROUTE AS NEEDED FOR OTHER. FREESTYLE ELITE 100 strip 3   pantoprazole (PROTONIX) 20 MG tablet TAKE 1 TABLET BY MOUTH EVERY DAY 90 tablet 2   Probiotic Product (PROBIOTIC PO) Take 1 capsule by mouth daily.     telmisartan-hydrochlorothiazide (MICARDIS HCT) 80-25 MG tablet TAKE 1 TABLET BY MOUTH EVERY DAY 90 tablet 1   venlafaxine XR (EFFEXOR-XR) 150 MG 24 hr capsule TAKE 1 CAPSULE BY MOUTH DAILY WITH BREAKFAST. 90 capsule 1   vitamin B-12 (CYANOCOBALAMIN) 1000 MCG tablet Take 1,000 mcg by mouth at bedtime.     ondansetron (ZOFRAN) 4 MG tablet Take 1 tablet (4 mg total) by mouth every 8 (eight) hours as needed for nausea or vomiting. (Patient not taking: Reported on 10/28/2022) 20 tablet 0   No current facility-administered medications on file prior to visit.    Social History   Tobacco Use   Smoking status: Never   Smokeless tobacco: Never  Vaping Use   Vaping Use:  Never used  Substance Use Topics   Alcohol use: No   Drug use: No    Review of Systems  Constitutional:  Negative for chills and fever.  Respiratory:  Negative for cough.   Cardiovascular:  Negative for chest pain and palpitations.  Gastrointestinal:  Negative for nausea and vomiting.  Psychiatric/Behavioral:  Negative for sleep disturbance and suicidal  ideas. The patient is nervous/anxious.       Objective:    BP (!) 124/50 (BP Location: Left Arm, Patient Position: Sitting, Cuff Size: Normal)   Pulse 78   Temp 98.2 F (36.8 C) (Oral)   Ht _0  (1.6 m)   Wt 187 lb (84.8 kg)   SpO2 96%   BMI 33.13 kg/m  BP Readings from Last 3 Encounters:  10/28/22 (!) 124/50  08/19/22 (!) 100/50  07/10/22 120/60   Wt Readings from Last 3 Encounters:  10/28/22 187 lb (84.8 kg)  08/19/22 180 lb 3.2 oz (81.7 kg)  07/10/22 175 lb 3.2 oz (79.5 kg)    Physical Exam Vitals reviewed.  Constitutional:      Appearance: She is well-developed.  Eyes:     Conjunctiva/sclera: Conjunctivae normal.  Cardiovascular:     Rate and Rhythm: Normal rate and regular rhythm.     Pulses: Normal pulses.     Heart sounds: Normal heart sounds.  Pulmonary:     Effort: Pulmonary effort is normal.     Breath sounds: Normal breath sounds. No wheezing, rhonchi or rales.  Skin:    General: Skin is warm and dry.  Neurological:     Mental Status: She is alert.  Psychiatric:        Speech: Speech normal.        Behavior: Behavior normal.        Thought Content: Thought content normal.        Assessment & Plan:   Problem List Items Addressed This Visit       Cardiovascular and Mediastinum   Essential hypertension    Chronic, stable.  Continue diltiazem 240 mg, telmisartan-hydrochlorothiazide 80-25 mg        Endocrine   Diabetes mellitus ([place])    Anticipate elevated based on fasting blood glucose readings.  Patient was previously pleased with Victoza and did not cause nausea and vomiting.  She  would like to retrial GLP-1 agonist.  We will start sample of Rybelsus today.  She will let me know how she is doing.  Continue Farxiga 10 mg, metformin 1000 mg twice daily.  Consider glipizide if she is intolerant to Rybelsus.  Close follow-up      Relevant Medications   Semaglutide (RYBELSUS) 3 MG TABS   Other Relevant Orders   Comp Met (CMET)   HgB A1c   TSH   CBC w/Diff   Lipid panel   Microalbumin / creatinine urine ratio     Other   Anxiety and depression    Suboptimal control.  Continue Effexor 150 mg.  Start BuSpar 5 mg 3 times daily as needed and titrate      Relevant Medications   busPIRone (BUSPAR) 5 MG tablet   Vitamin D deficiency - Primary   Relevant Orders   VITAMIN D 25 Hydroxy (Vit-D Deficiency, Fractures)     I am having Vanessa Higgins start on busPIRone and Rybelsus. I am also having her maintain her loratadine, cyanocobalamin, Vitamin D, OneTouch Verio, diphenoxylate-atropine, Probiotic Product (PROBIOTIC PO), dapagliflozin propanediol, Breztri Aerosphere, ondansetron, telmisartan-hydrochlorothiazide, pantoprazole, diltiazem, metFORMIN, venlafaxine XR, atorvastatin, albuterol, and montelukast.   Meds ordered this encounter  Medications   busPIRone (BUSPAR) 5 MG tablet    Sig: Take 1 tablet (5 mg total) by mouth 3 (three) times daily as needed.    Dispense:  60 tablet    Refill:  1    Order Specific Question:   Supervising Provider    Answer:  TULLO, TERESA L [[id number]]   Semaglutide (RYBELSUS) 3 MG TABS    Sig: Take 3 mg by mouth daily.    Dispense:  30 tablet    Refill:  0    Order Specific Question:   Supervising Provider    Answer:   Crecencio Mc [[id number]]    Return precautions given.   Risks, benefits, and alternatives of the medications and treatment plan prescribed today were discussed, and patient expressed understanding.   Education regarding symptom management and diagnosis given to patient on AVS.  Continue to follow with Burnard Hawthorne, FNP for routine health maintenance.   [name] and I agreed with plan.   Mable Paris, FNP

## 2022-10-28 NOTE — Assessment & Plan Note (Signed)
Anticipate elevated based on fasting blood glucose readings.  Patient was previously pleased with Victoza and did not cause nausea and vomiting.  She would like to retrial GLP-1 agonist.  We will start sample of Rybelsus today.  She will let me know how she is doing.  Continue Farxiga 10 mg, metformin 1000 mg twice daily.  Consider glipizide if she is intolerant to Rybelsus.  Close follow-up

## 2022-10-28 NOTE — Assessment & Plan Note (Signed)
Chronic, stable.  Continue diltiazem 240 mg, telmisartan-hydrochlorothiazide 80-25 mg

## 2022-10-28 NOTE — Patient Instructions (Addendum)
I have sent in BuSpar 5 mg three times daily  for you to take as needed for anxiety.  After 2 or 3 days, you may increase by 5 mg total per day.  For example,once increased you may take BuSpar 10 mg in the morning and 5 mg midday and 5 mg in the evening.  Please let me know how you are doing.  Start rybelsus '3mg'$  for 30 days and then we will increase to '7mg'$  once daily. You may call the office for this new prescription.  Also, with the Rybelsus, be very sure that you take on an empty stomach with a little bit of water and take 30 minutes before eating. Do not take with other medicines and no other food with medication. This is important for effectiveness.   Remember black box warning that you may not take this medication if you or a family member is diagnosed with thyroid cancer.    Semaglutide Oral Tablets What is this medicine? SEMAGLUTIDE (Sem a GLOO tide) controls blood sugar in people with type 2 diabetes. It is used with lifestyle changes like diet and exercise. This medicine may be used for other purposes; ask your health care provider or pharmacist if you have questions. COMMON BRAND NAME(S): Rybelsus What should I tell my health care provider before I take this medicine? They need to know if you have any of these conditions: endocrine tumors (MEN 2) or if someone in your family had these tumors eye disease history of pancreatitis kidney disease stomach or intestine problems thyroid cancer or if someone in your family had thyroid cancer vision problems an unusual or allergic reaction to semaglutide, other medicines, foods, dyes, or preservatives pregnant or trying to get pregnant breast-feeding How should I use this medicine? Take this medicine by mouth. Take it as directed on the prescription label at the same time every day. Take the dose right after waking up. Do not eat or drink anything before taking it. Do not take it with any other drink except a glass of plain water that is  less than 4 ounces (less than 120 mL). Do not cut, crush or chew this medicine. Swallow the tablets whole. After taking it, do not eat breakfast, drink, or take any other medicines or vitamins for at least 30 minutes. Keep taking it unless your health care provider tells you to stop. A special MedGuide will be given to you by the pharmacist with each prescription and refill. Be sure to read this information carefully each time. Talk to your health care provider about the use of this medicine in children. Special care may be needed. Overdosage: If you think you have taken too much of this medicine contact a poison control center or emergency room at once. NOTE: This medicine is only for you. Do not share this medicine with others. What if I miss a dose? If you miss a dose, skip it. Take your next dose at the normal time. Do not take extra or 2 doses at the same time to make up for the missed dose. What may interact with this medicine? What may interact with this medicine? aminophylline carbamazepine cyclosporine digoxin levothyroxine other medicines for diabetes phenytoin tacrolimus theophylline warfarin Many medications may cause changes in blood sugar, these include: alcohol containing beverages antiviral medicines for HIV or AIDS aspirin and aspirin-like drugs certain medicines for blood pressure, heart disease, irregular heart beat chromium diuretics female hormones, such as estrogens or progestins, birth control pills fenofibrate gemfibrozil isoniazid lanreotide  female hormones or anabolic steroids MAOIs like Carbex, Eldepryl, Marplan, Nardil, and Parnate medicines for weight loss medicines for allergies, asthma, cold, or cough medicines for depression, anxiety, or psychotic disturbances niacin nicotine NSAIDs, medicines for pain and inflammation, like ibuprofen or naproxen octreotide pasireotide pentamidine phenytoin probenecid quinolone antibiotics such as  ciprofloxacin, levofloxacin, ofloxacin some herbal dietary supplements steroid medicines such as prednisone or cortisone sulfamethoxazole; trimethoprim thyroid hormones Some medications can hide the warning symptoms of low blood sugar (hypoglycemia). You may need to monitor your blood sugar more closely if you are taking one of these medications. These include: beta-blockers, often used for high blood pressure or heart problems (examples include atenolol, metoprolol, propranolol) clonidine guanethidine reserpine This list may not describe all possible interactions. Give your health care provider a list of all the medicines, herbs, non-prescription drugs, or dietary supplements you use. Also tell them if you smoke, drink alcohol, or use illegal drugs. Some items may interact with your medicine. What should I watch for while using this medicine? Visit your health care provider for regular checks on your progress. Check with your health care provider if you have severe diarrhea, nausea, and vomiting, or if you sweat a lot. The loss of too much body fluid may make it dangerous for you to take this medicine. A test called the HbA1C (A1C) will be monitored. This is a simple blood test. It measures your blood sugar control over the last 2 to 3 months. You will receive this test every 3 to 6 months. Learn how to check your blood sugar. Learn the symptoms of low and high blood sugar and how to manage them. Always carry a quick-source of sugar with you in case you have symptoms of low blood sugar. Examples include hard sugar candy or glucose tablets. Make sure others know that you can choke if you eat or drink when you develop serious symptoms of low blood sugar, such as seizures or unconsciousness. Get medical help at once. Tell your health care provider if you have high blood sugar. You might need to change the dose of your medicine. If you are sick or exercising more than usual, you might need to change  the dose of your medicine. Do not skip meals. Ask your health care provider if you should avoid alcohol. Many nonprescription cough and cold products contain sugar or alcohol. These can affect blood sugar. Wear a medical ID bracelet or chain. Carry a card that describes your condition. List the medicines and doses you take on the card. Do not become pregnant while taking this medicine. Women should inform their health care provider if they wish to become pregnant or think they might be pregnant. There is a potential for serious side effects to an unborn child. Talk to your health care provider for more information. Do not breast-feed an infant while taking this medicine. What side effects may I notice from receiving this medicine? Side effects that you should report to your doctor or health care provider as soon as possible: allergic reactions (skin rash, itching or hives; swelling of the face, lips, or tongue) changes in vision diarrhea that continues or is severe infection (fever, chills, cough, sore throat, pain or trouble passing urine) kidney injury (trouble passing urine or change in the amount of urine) low blood sugar (feeling anxious; confusion; dizziness; increased hunger; unusually weak or tired; increased sweating; shakiness; cold, clammy skin; irritable; headache; blurred vision; fast heartbeat; loss of consciousness) lump or swelling on the neck painful or  difficulty swallowing severe nausea severe or unusual stomach pain trouble breathing vomiting Side effects that usually do not require medical attention (report these to your doctor or health care provider if they continue or are bothersome): constipation diarrhea nausea upset stomach This list may not describe all possible side effects. Call your doctor for medical advice about side effects. You may report side effects to FDA at 1-800-FDA-1088. Where should I keep my medicine? Keep out of the reach of children and  pets. Store at room temperature between 20 and 25 degrees C (68 and 77 degrees F). Keep this medicine in the original container. Protect from moisture. Keep the container tightly closed. Get rid of any unused medicine after the expiration date. To get rid of medicines that are no longer needed or have expired: Take the medicine to a medicine take-back program. Check with your pharmacy or law enforcement to find a location. If you cannot return the medicine, check the label or package insert to see if the medicine should be thrown out in the garbage or flushed down the toilet. If you are not sure, ask your health care provider. If it is safe to put it in the trash, take the medicine out of the container. Mix the medicine with cat litter, dirt, coffee grounds, or other unwanted substance. Seal the mixture in a bag or container. Put it in the trash. NOTE: This sheet is a summary. It may not cover all possible information. If you have questions about this medicine, talk to your doctor, pharmacist, or health care provider.  2021 Elsevier/Gold Standard (2020-11-13 15:08:33)   Nice to see you

## 2022-10-30 ENCOUNTER — Other Ambulatory Visit: Payer: Self-pay | Admitting: Family

## 2022-10-30 DIAGNOSIS — R899 Unspecified abnormal finding in specimens from other organs, systems and tissues: Secondary | ICD-10-CM

## 2022-10-31 ENCOUNTER — Telehealth: Payer: Self-pay | Admitting: *Deleted

## 2022-10-31 ENCOUNTER — Telehealth: Payer: Self-pay | Admitting: Pharmacist

## 2022-10-31 NOTE — Progress Notes (Signed)
[facility]) Quality  [facility] Quality  10/31/2022  JUPITER BOYS 1951/01/30 [id number]  Reason for referral: Medication Assistance  Referral source: [facility] Quality Team Referral medication(s): Wilder Glade & Judithann Sauger Current insurance:[employer]  Medication Assistance Findings:  Medication assistance needs identified: Spoke with Patient. HIPAA identifiers were obtained.  Patient confirmed still taking [name] and using Breztri but she said she discontinued Ozempic due to prolonged nausea after several months of trying to take it.  She appears to still quality for Judithann Sauger and  Wilder Glade and will be sent applications by our Pharmacy Technician team who will complete the medication assistance process.     Additional medication assistance options reviewed with patient as warranted:  No other options identified  Plan: I will route patient assistance letter to [place] technician who will coordinate patient assistance program application process for medications listed above.  [facility] pharmacy technician will assist with obtaining all required documents from both patient and provider(s) and submit application(s) once completed.    Elayne Guerin, PharmD, [place] Clinical Pharmacist [phone]

## 2022-10-31 NOTE — Telephone Encounter (Signed)
Called and left voicemail to update patient that she is not needing a prescription for the RSV vaccine. I advised her she should just be able to go to her local pharmacy. And receive it. Nothing further needed

## 2022-10-31 NOTE — Telephone Encounter (Signed)
Patient called and would like prescription for RSV vaccine  Please call and advise [phone]

## 2022-11-01 ENCOUNTER — Telehealth: Payer: Self-pay | Admitting: Pharmacy Technician

## 2022-11-01 DIAGNOSIS — Z596 Low income: Secondary | ICD-10-CM

## 2022-11-01 NOTE — Progress Notes (Signed)
[facility])                                            [place] Team    11/01/2022  LISIA WESTBAY 13-Dec-1951 [id number]                                      Medication Assistance Referral-FOR 2024 RE ENROLLMENT  Referral From: [place]  Medication/Company: Wilder Glade / [place] Patient application portion:  Mailed Provider application portion: Faxed  to Mable Paris, [place] Provider address/fax verified via: Office website  Medication/Company: Judithann Sauger / [place] Patient application portion:  Mailed Provider application portion: Faxed  to Mable Paris, [place] Provider address/fax verified via: Office website    Gearld Kerstein P. Conni Knighton, [place]  [phone]

## 2022-11-04 ENCOUNTER — Other Ambulatory Visit: Payer: Self-pay | Admitting: Family

## 2022-11-04 ENCOUNTER — Encounter: Payer: Self-pay | Admitting: Family

## 2022-11-04 ENCOUNTER — Other Ambulatory Visit: Payer: Self-pay

## 2022-11-05 ENCOUNTER — Other Ambulatory Visit: Payer: Self-pay | Admitting: Family

## 2022-11-05 DIAGNOSIS — Z794 Long term (current) use of insulin: Secondary | ICD-10-CM

## 2022-11-05 MED ORDER — LANTUS SOLOSTAR 100 UNIT/ML ~~LOC~~ SOPN: 6.0000 [IU] | PEN_INJECTOR | SUBCUTANEOUS | 1 refills | Status: DC

## 2022-11-05 MED ORDER — INSULIN PEN NEEDLE 31G X 6 MM MISC: 3 refills | Status: DC

## 2022-11-05 NOTE — Progress Notes (Signed)
Spoke with pt   FBG high 180, 190 Compliant with farxiga, metformin She is off rybelsus due to cost She has been on tresiba 6 units in the past and tolerated well  Plan:  Due to insurance plan opted to start with generic Lantus 6 units.  Patient will call with blood sugars persistently in the 373G and I certainly if she has a low.  She will call to schedule appointment with me in the next couple weeks to review blood sugar log

## 2022-11-20 ENCOUNTER — Ambulatory Visit (INDEPENDENT_AMBULATORY_CARE_PROVIDER_SITE_OTHER): Payer: HMO | Admitting: Family

## 2022-11-20 ENCOUNTER — Encounter: Payer: Self-pay | Admitting: Family

## 2022-11-20 VITALS — BP 120/78 | HR 71 | Temp 98.0°F | Wt 185.2 lb

## 2022-11-20 DIAGNOSIS — F32A Depression, unspecified: Secondary | ICD-10-CM

## 2022-11-20 DIAGNOSIS — E1169 Type 2 diabetes mellitus with other specified complication: Secondary | ICD-10-CM

## 2022-11-20 DIAGNOSIS — F419 Anxiety disorder, unspecified: Secondary | ICD-10-CM | POA: Diagnosis not present

## 2022-11-20 DIAGNOSIS — Z794 Long term (current) use of insulin: Secondary | ICD-10-CM

## 2022-11-20 NOTE — Assessment & Plan Note (Signed)
Improved.  Continue Effexor 150 mg.  Patient has been taking BuSpar 5 mg twice daily and likely will increase to BuSpar 5 mg 3 times daily.  Will follow

## 2022-11-20 NOTE — Assessment & Plan Note (Signed)
Please with improvement based on FBG, anticipate A1c closer to 7.  Discussed with patient in the setting of lifestyle modifications, that we would continue current course unless blood sugars were to escalate which I advised her to call the office to let me know so that we can adjust medication prior to follow up .  Rybelsus was cost prohibitive Continue Farxiga 10 mg, metformin 1000 mg twice daily, lantus 6 units qam.

## 2022-11-20 NOTE — Progress Notes (Signed)
Subjective:    Patient ID: Vanessa Higgins, female    DOB: 1951/02/27, 71 y.o.   MRN: [id number]  CC: Vanessa Higgins is a 71 y.o. female who presents today for follow up.   HPI: Feels well today.  No new complaints.   DM- Rybelsus was cost prohibitive.  She is eating healthier diet. She remains compliant with Farxiga 10 mg, metformin 1000 mg twice daily, lantus 6 units qam. FBG have improved and on average 170  Postprandial 220.  Denies hypoglycemic episode.  Anxiety and depression-compliant with Effexor 150 mg, started BuSpar 5 mg 3 times daily at previous visit.  She is been taking BuSpar 5 mg twice daily which has been helpful for her.  She may increase to 3 times daily.  HISTORY:  Past Medical History:  Diagnosis Date   Allergy    hay fever   Anemia    Ankle fracture, left 04/08/2014   Anxiety and depression    Arthritis    Asthma    well controlled    Blood transfusion abn reaction or complication, no procedure mishap    Blood transfusion without reported diagnosis    COVID-19 virus infection 08/2020   Depression    Diabetes mellitus    Disc displacement, lumbar    Dysrhythmia    pvc's   Fatty liver    GERD (gastroesophageal reflux disease)    Headache(784.0)    History of hiatal hernia    small   Hyperlipidemia    Hypertension    Joint pain    Low vitamin B12 level    Low vitamin D level    Migraine    Mitral valve prolapse    Obstructive sleep apnea    CPAP   Osteoarthritis    Palpitations    PONV (postoperative nausea and vomiting)    h/o in the past   Sleep apnea 04/06/2017   uses CPAP   SOB (shortness of breath)    UTI (lower urinary tract infection)    Vitamin B 12 deficiency    Vitamin D deficiency    Wears glasses    Past Surgical History:  Procedure Laterality Date   APPENDECTOMY  1977   COLONOSCOPY  last 11/17/2014   INGUINAL HERNIA REPAIR Right 11/01/2020   Procedure: HERNIA REPAIR INGUINAL ADULT;  Surgeon: Robert Bellow, MD;   Location: [facility];  Service: General;  Laterality: Right;   JOINT REPLACEMENT     KNEE ARTHROPLASTY Right 03/03/2019   Procedure: COMPUTER ASSISTED TOTAL KNEE ARTHROPLASTY-RIGHT;  Surgeon: Dereck Leep, MD;  Location: [facility];  Service: Orthopedics;  Laterality: Right;   LUMBAR LAMINECTOMY/DECOMPRESSION MICRODISCECTOMY Right 02/06/2018   Procedure: Lumbar two Hemilaminectomy for Discectomy;  Surgeon: Ashok Pall, MD;  Location: [place];  Service: Neurosurgery;  Laterality: Right;   ORIF ANKLE FRACTURE Left 04/08/2014   Procedure: OPEN REDUCTION INTERNAL FIXATION (ORIF) ANKLE FRACTURE LEFT ANKLE FRACTURE OPEN TREATMENT BIMALLEOLAR ANKLE INCLUDES INTERNAL FIXATION;  Surgeon: Johnny Bridge, MD;  Location: [place];  Service: Orthopedics;  Laterality: Left;   PAROTIDECTOMY  1980   left   POLYPECTOMY     TONSILLECTOMY AND ADENOIDECTOMY     as a child   TOTAL KNEE ARTHROPLASTY Bilateral    left   TUBAL LIGATION  05/1976   Family History  Problem Relation Age of Onset   Sudden death Mother    Cancer Mother 29       pancreatic   Heart disease Father 63  Sudden death Father    Cancer Sister 49       brain   Cancer Brother        colon   Colon cancer Brother 33   Stroke Maternal Grandmother    Heart disease Maternal Grandfather    Aneurysm Paternal Aunt    Breast cancer Neg Hx    Colon polyps Neg Hx    Esophageal cancer Neg Hx    Rectal cancer Neg Hx    Stomach cancer Neg Hx    Thyroid cancer Neg Hx     Allergies: Sulfa antibiotics, Lisinopril, Other, and Ramipril Current Outpatient Medications on File Prior to Visit  Medication Sig Dispense Refill   Insulin Pen Needle 31G X 6 MM MISC Use one pen needle daily. 90 each 3   albuterol (VENTOLIN HFA) 108 (90 Base) MCG/ACT inhaler Inhale 2 puffs into the lungs every 6 (six) hours as needed for wheezing or shortness of breath. 18 g 2   atorvastatin (LIPITOR) 20 MG tablet TAKE 1 TABLET BY MOUTH EVERYDAY AT BEDTIME 90  tablet 1   Budeson-Glycopyrrol-Formoterol (BREZTRI AEROSPHERE) 160-9-4.8 MCG/ACT AERO Inhale 2 puffs into the lungs in the morning and at bedtime. 32.1 g 3   busPIRone (BUSPAR) 5 MG tablet Take 1 tablet (5 mg total) by mouth 3 (three) times daily as needed. 60 tablet 1   Cholecalciferol (VITAMIN D) 125 MCG (5000 UT) CAPS Take 5,000 Units by mouth daily.     dapagliflozin propanediol (FARXIGA) 10 MG TABS tablet Take 1 tablet (10 mg total) by mouth daily before breakfast. 90 tablet 1   diltiazem (CARDIZEM CD) 240 MG 24 hr capsule TAKE 1 CAPSULE BY MOUTH EVERY DAY 90 capsule 2   diphenoxylate-atropine (LOMOTIL) 2.5-0.025 MG tablet Take 1 tablet by mouth every 6 (six) hours as needed for diarrhea or loose stools. 30 tablet 1   insulin glargine (LANTUS SOLOSTAR) 100 UNIT/ML Solostar Pen Inject 6 Units into the skin every morning. 15 mL 1   loratadine (CLARITIN) 10 MG tablet Take 10 mg by mouth at bedtime.      metFORMIN (GLUCOPHAGE-XR) 500 MG 24 hr tablet TAKE 2 TABLETS BY MOUTH TWICE A DAY 360 tablet 1   montelukast (SINGULAIR) 10 MG tablet TAKE 1 TABLET BY MOUTH EVERYDAY AT BEDTIME 90 tablet 1   ondansetron (ZOFRAN) 4 MG tablet Take 1 tablet (4 mg total) by mouth every 8 (eight) hours as needed for nausea or vomiting. (Patient not taking: Reported on 10/28/2022) 20 tablet 0   ONETOUCH VERIO test strip USE 1 EACH BY OTHER ROUTE AS NEEDED FOR OTHER. FREESTYLE ELITE 100 strip 3   pantoprazole (PROTONIX) 20 MG tablet TAKE 1 TABLET BY MOUTH EVERY DAY 90 tablet 2   Probiotic Product (PROBIOTIC PO) Take 1 capsule by mouth daily.     telmisartan-hydrochlorothiazide (MICARDIS HCT) 80-25 MG tablet TAKE 1 TABLET BY MOUTH EVERY DAY 90 tablet 1   venlafaxine XR (EFFEXOR-XR) 150 MG 24 hr capsule TAKE 1 CAPSULE BY MOUTH DAILY WITH BREAKFAST. 90 capsule 1   vitamin B-12 (CYANOCOBALAMIN) 1000 MCG tablet Take 1,000 mcg by mouth at bedtime.     No current facility-administered medications on file prior to visit.     Social History   Tobacco Use   Smoking status: Never   Smokeless tobacco: Never  Vaping Use   Vaping Use: Never used  Substance Use Topics   Alcohol use: No   Drug use: No    Review of Systems  Constitutional:  Negative for chills and fever.  Respiratory:  Negative for cough.   Cardiovascular:  Negative for chest pain and palpitations.  Gastrointestinal:  Negative for nausea and vomiting.  Psychiatric/Behavioral:  The patient is not nervous/anxious (improved).       Objective:    BP 120/78 (BP Location: Left Arm, Patient Position: Sitting, Cuff Size: Normal)   Pulse 71   Temp 98 F (36.7 C) (Oral)   Wt 185 lb 3.2 oz (84 kg)   SpO2 96%   BMI 32.81 kg/m  BP Readings from Last 3 Encounters:  11/20/22 120/78  10/28/22 (!) 124/50  08/19/22 (!) 100/50   Wt Readings from Last 3 Encounters:  11/20/22 185 lb 3.2 oz (84 kg)  10/28/22 187 lb (84.8 kg)  08/19/22 180 lb 3.2 oz (81.7 kg)    Physical Exam Vitals reviewed.  Constitutional:      Appearance: She is well-developed.  Eyes:     Conjunctiva/sclera: Conjunctivae normal.  Cardiovascular:     Rate and Rhythm: Normal rate and regular rhythm.     Pulses: Normal pulses.     Heart sounds: Normal heart sounds.  Pulmonary:     Effort: Pulmonary effort is normal.     Breath sounds: Normal breath sounds. No wheezing, rhonchi or rales.  Skin:    General: Skin is warm and dry.  Neurological:     Mental Status: She is alert.  Psychiatric:        Speech: Speech normal.        Behavior: Behavior normal.        Thought Content: Thought content normal.        Assessment & Plan:   Problem List Items Addressed This Visit       Endocrine   Diabetes mellitus ([place]) - Primary    Please with improvement based on FBG, anticipate A1c closer to 7.  Discussed with patient in the setting of lifestyle modifications, that we would continue current course unless blood sugars were to escalate which I advised her to call the  office to let me know so that we can adjust medication prior to follow up .  Rybelsus was cost prohibitive Continue Farxiga 10 mg, metformin 1000 mg twice daily, lantus 6 units qam.         Other   Anxiety and depression    Improved.  Continue Effexor 150 mg.  Patient has been taking BuSpar 5 mg twice daily and likely will increase to BuSpar 5 mg 3 times daily.  Will follow        I am having Braydee S. Gopal maintain her loratadine, cyanocobalamin, Vitamin D, OneTouch Verio, diphenoxylate-atropine, Probiotic Product (PROBIOTIC PO), dapagliflozin propanediol, Breztri Aerosphere, ondansetron, telmisartan-hydrochlorothiazide, pantoprazole, diltiazem, metFORMIN, venlafaxine XR, atorvastatin, albuterol, montelukast, busPIRone, Lantus SoloStar, and Insulin Pen Needle.   No orders of the defined types were placed in this encounter.   Return precautions given.   Risks, benefits, and alternatives of the medications and treatment plan prescribed today were discussed, and patient expressed understanding.   Education regarding symptom management and diagnosis given to patient on AVS.  Continue to follow with Burnard Hawthorne, FNP for routine health maintenance.   [name] and I agreed with plan.   Mable Paris, FNP

## 2022-11-27 ENCOUNTER — Telehealth: Payer: Self-pay

## 2022-11-27 NOTE — Telephone Encounter (Signed)
Patient states she had labs drawn here on 10/28/2022, and her insurance covered all except the thyroid lab because the diagnosis code was incorrect.  Patient states she would like for [name] to please correct it.

## 2022-12-05 ENCOUNTER — Other Ambulatory Visit: Payer: Self-pay | Admitting: Family

## 2022-12-05 DIAGNOSIS — I10 Essential (primary) hypertension: Secondary | ICD-10-CM

## 2022-12-05 DIAGNOSIS — Z794 Long term (current) use of insulin: Secondary | ICD-10-CM

## 2022-12-06 ENCOUNTER — Telehealth: Payer: Self-pay | Admitting: Pharmacy Technician

## 2022-12-06 DIAGNOSIS — Z596 Low income: Secondary | ICD-10-CM

## 2022-12-06 NOTE — Progress Notes (Signed)
[facility])                                            [place] Team    12/06/2022  Vanessa Higgins [birth year] [id number]  Received both patient and provider portion(s) of patient assistance application(s) for [place]. Faxed completed application and required documents into [employer].    [name] P. Quamir Willemsen, [place]  [phone]

## 2022-12-13 ENCOUNTER — Ambulatory Visit: Payer: HMO | Admitting: Internal Medicine

## 2022-12-17 ENCOUNTER — Other Ambulatory Visit: Payer: Self-pay | Admitting: Pulmonary Disease

## 2022-12-18 ENCOUNTER — Other Ambulatory Visit: Payer: HMO

## 2022-12-19 ENCOUNTER — Other Ambulatory Visit (INDEPENDENT_AMBULATORY_CARE_PROVIDER_SITE_OTHER): Payer: HMO

## 2022-12-19 DIAGNOSIS — R899 Unspecified abnormal finding in specimens from other organs, systems and tissues: Secondary | ICD-10-CM

## 2022-12-19 LAB — CBC WITH DIFFERENTIAL/PLATELET
Basophils Absolute: 0 10*3/uL (ref 0.0–0.1)
Basophils Relative: 0.6 % (ref 0.0–3.0)
Eosinophils Absolute: 0.3 10*3/uL (ref 0.0–0.7)
Eosinophils Relative: 4.3 % (ref 0.0–5.0)
HCT: 41 % (ref 36.0–46.0)
Hemoglobin: 13.6 g/dL (ref 12.0–15.0)
Lymphocytes Relative: 27.8 % (ref 12.0–46.0)
Lymphs Abs: 1.8 10*3/uL (ref 0.7–4.0)
MCHC: 33.1 g/dL (ref 30.0–36.0)
MCV: 93.7 fl (ref 78.0–100.0)
Monocytes Absolute: 0.5 10*3/uL (ref 0.1–1.0)
Monocytes Relative: 8.3 % (ref 3.0–12.0)
Neutro Abs: 3.7 10*3/uL (ref 1.4–7.7)
Neutrophils Relative %: 59 % (ref 43.0–77.0)
Platelets: 280 10*3/uL (ref 150.0–400.0)
RBC: 4.38 Mil/uL (ref 3.87–5.11)
RDW: 13.2 % (ref 11.5–15.5)
WBC: 6.3 10*3/uL (ref 4.0–10.5)

## 2022-12-19 LAB — GAMMA GT: GGT: 14 U/L (ref 7–51)

## 2022-12-20 LAB — BILIRUBIN, FRACTIONATED(TOT/DIR/INDIR)
Bilirubin, Direct: 0.2 mg/dL (ref 0.0–0.2)
Indirect Bilirubin: 0.6 mg/dL (calc) (ref 0.2–1.2)
Total Bilirubin: 0.8 mg/dL (ref 0.2–1.2)

## 2022-12-26 ENCOUNTER — Telehealth: Payer: Self-pay

## 2022-12-26 NOTE — Telephone Encounter (Signed)
LVM for patient to call back to inform her that she can pick up Ozempic from the office.  Shanedra Lave,cma

## 2023-01-10 ENCOUNTER — Telehealth: Payer: Self-pay | Admitting: Pharmacy Technician

## 2023-01-10 DIAGNOSIS — Z596 Low income: Secondary | ICD-10-CM

## 2023-01-10 NOTE — Progress Notes (Signed)
[facility])                                            [place] Team    01/10/2023  Vanessa Higgins 06-20-1951 [id number]  Care coordination call placed to [employer] in regard to [facility] and [name] application.  Spoke to [place] who informs patient is APPROVED 12/30/22-12/30/23. Medications will auto ship based on last fill date in 2023 and will be delivered to the patient's home.  [name] P. [name], [place]  [phone]

## 2023-01-15 DIAGNOSIS — H2513 Age-related nuclear cataract, bilateral: Secondary | ICD-10-CM | POA: Diagnosis not present

## 2023-01-15 DIAGNOSIS — E119 Type 2 diabetes mellitus without complications: Secondary | ICD-10-CM | POA: Diagnosis not present

## 2023-01-23 ENCOUNTER — Telehealth: Payer: Self-pay | Admitting: Family

## 2023-01-23 DIAGNOSIS — E1169 Type 2 diabetes mellitus with other specified complication: Secondary | ICD-10-CM | POA: Diagnosis not present

## 2023-01-23 DIAGNOSIS — Z794 Long term (current) use of insulin: Secondary | ICD-10-CM | POA: Diagnosis not present

## 2023-01-23 DIAGNOSIS — M79674 Pain in right toe(s): Secondary | ICD-10-CM | POA: Diagnosis not present

## 2023-01-23 DIAGNOSIS — B351 Tinea unguium: Secondary | ICD-10-CM | POA: Diagnosis not present

## 2023-01-23 DIAGNOSIS — S90211A Contusion of right great toe with damage to nail, initial encounter: Secondary | ICD-10-CM | POA: Diagnosis not present

## 2023-01-23 NOTE — Telephone Encounter (Signed)
Left message for patient to call back and schedule Medicare Annual Wellness Visit (AWV).   Pease offer to do by telephone.  Left office number and my jabber [phone].  Last AWV:01/07/2022   Please schedule at anytime with Nurse Health Advisor.

## 2023-01-30 DIAGNOSIS — H2513 Age-related nuclear cataract, bilateral: Secondary | ICD-10-CM | POA: Diagnosis not present

## 2023-01-30 DIAGNOSIS — H43813 Vitreous degeneration, bilateral: Secondary | ICD-10-CM | POA: Diagnosis not present

## 2023-01-30 DIAGNOSIS — E119 Type 2 diabetes mellitus without complications: Secondary | ICD-10-CM | POA: Diagnosis not present

## 2023-02-03 ENCOUNTER — Telehealth: Payer: Self-pay | Admitting: Family

## 2023-02-03 DIAGNOSIS — E1169 Type 2 diabetes mellitus with other specified complication: Secondary | ICD-10-CM

## 2023-02-03 MED ORDER — METFORMIN HCL ER 500 MG PO TB24: 1000.0000 mg | ORAL_TABLET | Freq: Two times a day (BID) | ORAL | 1 refills | Status: DC

## 2023-02-03 NOTE — Telephone Encounter (Signed)
Pt need a refill on metformin sent to [facility]

## 2023-02-03 NOTE — Telephone Encounter (Signed)
RX sent in to pharmacy pt is aware

## 2023-02-05 DIAGNOSIS — H2512 Age-related nuclear cataract, left eye: Secondary | ICD-10-CM | POA: Diagnosis not present

## 2023-02-05 DIAGNOSIS — H2511 Age-related nuclear cataract, right eye: Secondary | ICD-10-CM | POA: Diagnosis not present

## 2023-02-05 NOTE — Progress Notes (Signed)
Cardiology Office Note    Date:  02/07/2023   ID:  Vanessa Higgins, DOB [birth year], MRN [id number]  PCP:  Burnard Hawthorne, FNP  Cardiologist:  [name], MD  Electrophysiologist:  None   Chief Complaint: Follow-up  History of Present Illness:   Vanessa Higgins is a 72 y.o. female with history of frequent PVCs, mitral regurgitation, aortic insufficiency, HTN, HLD, DM2, right MCA cerebral aneurysm, asthma, OSA on CPAP, and GERD who presents for follow-up of PVCs and valvular heart disease.  She was previously followed by Dr. Yvone Neu with Lexiscan MPI in 2017 showing no significant ischemia with an EF of 55% and was overall low risk.  Echo at that time showed an EF of 60 to 65%, normal wall motion, normal LV diastolic function parameters, normal RV systolic function, and normal PASP.  24-hour Holter in 2017 showed an overall sinus rhythm with an average rate of 82 bpm with one 5-beat atrial run, frequent PVCs representing an 11% burden, 5 ventricular couplets, ventricular bigeminy, and rare PACs.  She subsequently transitioned her care to Dr. Saunders Revel.  Most recent echo from 05/2020 showed an EF of 60 to 65%, no regional wall motion abnormalities, grade 1 diastolic dysfunction, normal RV systolic function and ventricular cavity size, mild mitral regurgitation, and mild to moderate aortic insufficiency.  In 02/2022, verapamil was transitioned to diltiazem secondary to cost of medication.  She was last seen in the office in 05/2022 noting rare palpitations on diltiazem and was without symptoms of angina or decompensation.  No changes were made.  Carotid artery ultrasound from 07/2022 showed no hemodynamically significant stenosis in the bilateral ICAs.  She underwent MRI of the brain and MRA of the head/neck in 08/2022 which showed a 2 to 3 mm right MCA bifurcation aneurysm.  She has since followed up with neurology with plans to update imaging in 12 months.  She comes in doing very well from a cardiac  perspective and is without symptoms of angina or decompensation.  No palpitations, presyncope, or syncope.  She does note some positional dizziness if she stands quickly.  No lower extremity swelling or falls.  Tolerating medications without issues.  Overall, she feels like she is doing well and does not have any acute cardiac concerns at this time.   Labs independently reviewed: 11/2022 - Hgb 13.6, PLT 280 09/2022 - TC 153, TG 200, HDL 52, LDL 61, TSH normal, A1c 8.4, potassium 3.9, BUN 23, serum creatinine 0.75, albumin 4.5, AST/ALT normal  Past Medical History:  Diagnosis Date   Allergy    hay fever   Anemia    Ankle fracture, left 04/08/2014   Anxiety and depression    Arthritis    Asthma    well controlled    Blood transfusion abn reaction or complication, no procedure mishap    Blood transfusion without reported diagnosis    COVID-19 virus infection 08/2020   Depression    Diabetes mellitus    Disc displacement, lumbar    Dysrhythmia    pvc's   Fatty liver    GERD (gastroesophageal reflux disease)    Headache(784.0)    History of hiatal hernia    small   Hyperlipidemia    Hypertension    Joint pain    Low vitamin B12 level    Low vitamin D level    Migraine    Mitral valve prolapse    Obstructive sleep apnea    CPAP   Osteoarthritis  Palpitations    PONV (postoperative nausea and vomiting)    h/o in the past   Sleep apnea 04/06/2017   uses CPAP   SOB (shortness of breath)    UTI (lower urinary tract infection)    Vitamin B 12 deficiency    Vitamin D deficiency    Wears glasses     Past Surgical History:  Procedure Laterality Date   APPENDECTOMY  1977   COLONOSCOPY  last 11/17/2014   INGUINAL HERNIA REPAIR Right 11/01/2020   Procedure: HERNIA REPAIR INGUINAL ADULT;  Surgeon: Robert Bellow, MD;  Location: [facility];  Service: General;  Laterality: Right;   JOINT REPLACEMENT     KNEE ARTHROPLASTY Right 03/03/2019   Procedure: COMPUTER ASSISTED TOTAL  KNEE ARTHROPLASTY-RIGHT;  Surgeon: Dereck Leep, MD;  Location: [facility];  Service: Orthopedics;  Laterality: Right;   LUMBAR LAMINECTOMY/DECOMPRESSION MICRODISCECTOMY Right 02/06/2018   Procedure: Lumbar two Hemilaminectomy for Discectomy;  Surgeon: Ashok Pall, MD;  Location: [place];  Service: Neurosurgery;  Laterality: Right;   ORIF ANKLE FRACTURE Left 04/08/2014   Procedure: OPEN REDUCTION INTERNAL FIXATION (ORIF) ANKLE FRACTURE LEFT ANKLE FRACTURE OPEN TREATMENT BIMALLEOLAR ANKLE INCLUDES INTERNAL FIXATION;  Surgeon: Johnny Bridge, MD;  Location: [place];  Service: Orthopedics;  Laterality: Left;   PAROTIDECTOMY  1980   left   POLYPECTOMY     TONSILLECTOMY AND ADENOIDECTOMY     as a child   TOTAL KNEE ARTHROPLASTY Bilateral    left   TUBAL LIGATION  05/1976    Current Medications: Current Meds  Medication Sig   albuterol (VENTOLIN HFA) 108 (90 Base) MCG/ACT inhaler TAKE 2 PUFFS BY MOUTH EVERY 6 HOURS AS NEEDED FOR WHEEZE OR SHORTNESS OF BREATH   atorvastatin (LIPITOR) 20 MG tablet TAKE 1 TABLET BY MOUTH EVERYDAY AT BEDTIME   Budeson-Glycopyrrol-Formoterol (BREZTRI AEROSPHERE) 160-9-4.8 MCG/ACT AERO Inhale 2 puffs into the lungs in the morning and at bedtime.   busPIRone (BUSPAR) 5 MG tablet Take 1 tablet (5 mg total) by mouth 3 (three) times daily as needed.   Cholecalciferol (VITAMIN D) 125 MCG (5000 UT) CAPS Take 5,000 Units by mouth daily.   dapagliflozin propanediol (FARXIGA) 10 MG TABS tablet Take 1 tablet (10 mg total) by mouth daily before breakfast.   diltiazem (CARDIZEM CD) 240 MG 24 hr capsule TAKE 1 CAPSULE BY MOUTH EVERY DAY   diphenoxylate-atropine (LOMOTIL) 2.5-0.025 MG tablet Take 1 tablet by mouth every 6 (six) hours as needed for diarrhea or loose stools.   insulin glargine (LANTUS SOLOSTAR) 100 UNIT/ML Solostar Pen Inject 6 Units into the skin every morning.   Insulin Pen Needle 31G X 6 MM MISC Use one pen needle daily.   loratadine (CLARITIN)  10 MG tablet Take 10 mg by mouth at bedtime.    metFORMIN (GLUCOPHAGE-XR) 500 MG 24 hr tablet Take 2 tablets (1,000 mg total) by mouth 2 (two) times daily.   montelukast (SINGULAIR) 10 MG tablet TAKE 1 TABLET BY MOUTH EVERYDAY AT BEDTIME   ONETOUCH VERIO test strip USE 1 EACH BY OTHER ROUTE AS NEEDED FOR OTHER. FREESTYLE ELITE   pantoprazole (PROTONIX) 20 MG tablet TAKE 1 TABLET BY MOUTH EVERY DAY   Probiotic Product (PROBIOTIC PO) Take 1 capsule by mouth daily.   telmisartan-hydrochlorothiazide (MICARDIS HCT) 80-25 MG tablet TAKE 1 TABLET BY MOUTH EVERY DAY   venlafaxine XR (EFFEXOR-XR) 150 MG 24 hr capsule TAKE 1 CAPSULE BY MOUTH DAILY WITH BREAKFAST.   vitamin B-12 (CYANOCOBALAMIN) 1000 MCG tablet  Take 1,000 mcg by mouth at bedtime.    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 Husband deceased March 19, 2008.      Work - [employer]- [employer], [employer]   Diet - healthy   Exercise - gym 2 x per week   Social Determinants of Health   Financial Resource Strain: Medium Risk (01/21/2022)   Overall Financial Resource Strain (CARDIA)    Difficulty of Paying Living Expenses: Somewhat hard  Food Insecurity: No Food Insecurity (01/07/2022)   Hunger Vital Sign    Worried About Running Out of Food in the Last Year: Never true    Ran Out of Food in the Last Year: Never true  Transportation  Needs: No Transportation Needs (01/07/2022)   PRAPARE - Hydrologist (Medical): No    Lack of Transportation (Non-Medical): No  Physical Activity: Not on file  Stress: No Stress Concern Present (01/07/2022)   [place]    Feeling of Stress : Not at all  Social Connections: Unknown (01/07/2022)   Social Connection and Isolation Panel [NHANES]    Frequency of Communication with Friends and Family: More than three times a week    Frequency of Social Gatherings with Friends and Family: More than three times a week    Attends Religious Services: Not on Advertising copywriter or Organizations: Not on file    Attends Archivist Meetings: Not on file    Marital Status: Not on file     Family History:  The patient's family history includes Aneurysm in her paternal aunt; Cancer in her brother; Cancer (age of onset: 73) in her sister; Cancer (age of onset: 87) in her mother; Colon cancer (age of onset: 75) in her brother; Heart disease in her maternal grandfather; Heart disease (age of onset: 24) in her father; Stroke in her maternal grandmother; Sudden death in her father and mother. There is no history of Breast cancer, Colon polyps, Esophageal cancer, Rectal cancer, Stomach cancer, or Thyroid cancer.  ROS:   12-point review of systems is negative unless otherwise noted in the HPI.   EKGs/Labs/Other Studies Reviewed:    Studies reviewed were summarized above. The additional studies were reviewed today:  2D echo 06/15/2020: 1. Left ventricular ejection fraction, by estimation, is 60 to 65%. The  left ventricle has normal function. The left ventricle has no regional  wall motion abnormalities. Left ventricular diastolic parameters are  consistent with Grade I diastolic  dysfunction (impaired relaxation).   2. Right ventricular systolic function is normal. The right ventricular  size is  normal.   3. The mitral valve is normal in structure. Mild mitral valve  regurgitation.   4. The aortic  valve was not well visualized. Aortic valve regurgitation  is mild to moderate.  __________  24-hour Holter 08/2016: Overall rhythm - sinus, 65-111, ave 82 bpm   Supraventricular ectopy: 10 PACs, 1 couplet, one 5beat atrial run at 132bpm   Ventricular ectopy 11% of total beats: 12264 PVCs ,5 ventricular couplets, 1010 in bigeminy __________  2D echo 09/19/2016: - Left ventricle: The cavity size was normal. Systolic function was    normal. The estimated ejection fraction was in the range of 60%    to 65%. Wall motion was normal; there were no regional wall    motion abnormalities. Left ventricular diastolic function    parameters were normal.  - Aortic root: The aortic root was mildly dilated 3.4 cm  - Left atrium: The atrium was normal in size.  - Right ventricle: Systolic function was normal.  - Pulmonary arteries: Systolic pressure was within the normal    range.  __________  Carlton Adam MPI 09/06/2016: Pharmacological myocardial perfusion imaging study with no significant  ischemia Normal wall motion, EF estimated at 55% No EKG changes concerning for ischemia at peak stress or in recovery. Low risk scan   EKG:  EKG is ordered today.  The EKG ordered today demonstrates NSR, 69 bpm, low voltage QRS, poor R wave progression along the precordial leads consistent with prior tracing  Recent Labs: 10/28/2022: ALT 20; BUN 23; Creatinine, Ser 0.75; Potassium 3.9; Sodium 138; TSH 1.03 12/19/2022: Hemoglobin 13.6; Platelets 280.0  Recent Lipid Panel    Component Value Date/Time   CHOL 153 10/28/2022 0951   CHOL 105 07/12/2020 1451   CHOL 102 06/28/2013 0711   TRIG 200.0 (H) 10/28/2022 0951   TRIG 81 06/28/2013 0711   HDL 52.40 10/28/2022 0951   HDL 41 07/12/2020 1451   HDL 44 06/28/2013 0711   CHOLHDL 3 10/28/2022 0951   VLDL 40.0 10/28/2022 0951   VLDL 16 06/28/2013 0711    LDLCALC 61 10/28/2022 0951   LDLCALC 49 07/12/2020 1451   LDLCALC 42 06/28/2013 0711    PHYSICAL EXAM:    VS:  BP 109/69 (BP Location: Left Arm, Patient Position: Sitting, Cuff Size: Normal)   Pulse 69   Ht 5' 3"$  (1.6 m)   Wt 190 lb 6.4 oz (86.4 kg)   SpO2 93%   BMI 33.73 kg/m   BMI: Body mass index is 33.73 kg/m.  Physical Exam Vitals reviewed.  Constitutional:      Appearance: She is well-developed.  HENT:     Head: Normocephalic and atraumatic.  Eyes:     General:        Right eye: No discharge.        Left eye: No discharge.  Neck:     Vascular: No JVD.  Cardiovascular:     Rate and Rhythm: Normal rate and regular rhythm.     Heart sounds: S1 normal and S2 normal. Heart sounds not distant. No midsystolic click and no opening snap. Murmur heard.     Diastolic murmur is present with a grade of 1/4 at the upper right sternal border.     No friction rub.  Pulmonary:     Effort: Pulmonary effort is normal. No respiratory distress.     Breath sounds: Normal breath sounds. No decreased breath sounds, wheezing or rales.  Chest:     Chest wall: No tenderness.  Abdominal:     General: There is no distension.  Musculoskeletal:     Cervical back: Normal range of motion.  Right lower leg: No edema.     Left lower leg: No edema.  Skin:    General: Skin is warm and dry.     Nails: There is no clubbing.  Neurological:     Mental Status: She is alert and oriented to person, place, and time.  Psychiatric:        Speech: Speech normal.        Behavior: Behavior normal.        Thought Content: Thought content normal.        Judgment: Judgment normal.     Wt Readings from Last 3 Encounters:  02/07/23 190 lb 6.4 oz (86.4 kg)  11/20/22 185 lb 3.2 oz (84 kg)  10/28/22 187 lb (84.8 kg)     ASSESSMENT & PLAN:   Frequent PVCs: Quiescent.  Stress testing nonischemic.  Prior echo has demonstrated preserved LV systolic function.  She remains on Cardizem CD to 40 mg  daily.  Valvular heart disease: Echo from 2021 showed mild mitral regurgitation with mild to moderate aortic insufficiency.  Update echo.  HTN: Blood pressure is well-controlled in the office today.  She remains on Cardizem CD and telmisartan/HCTZ.  Recent renal function and electrolytes stable.  HLD: LDL 61 in 09/2022.  Recent AST/ALT normal.  She remains on atorvastatin 20 mg.   Disposition: F/u with Dr. Saunders Revel or an APP in 6 months.   Medication Adjustments/Labs and Tests Ordered: Current medicines are reviewed at length with the patient today.  Concerns regarding medicines are outlined above. Medication changes, Labs and Tests ordered today are summarized above and listed in the Patient Instructions accessible in Encounters.   Signed, [name], PA-C 02/07/2023 4:17 PM     [facility] [address] [place] [address] [place], [place] [address] [phone]

## 2023-02-06 ENCOUNTER — Ambulatory Visit: Payer: HMO | Admitting: Internal Medicine

## 2023-02-07 ENCOUNTER — Encounter: Payer: Self-pay | Admitting: Physician Assistant

## 2023-02-07 ENCOUNTER — Ambulatory Visit: Payer: PPO | Attending: Internal Medicine | Admitting: Physician Assistant

## 2023-02-07 VITALS — BP 109/69 | HR 69 | Ht 63.0 in | Wt 190.4 lb

## 2023-02-07 DIAGNOSIS — I10 Essential (primary) hypertension: Secondary | ICD-10-CM | POA: Diagnosis not present

## 2023-02-07 DIAGNOSIS — I351 Nonrheumatic aortic (valve) insufficiency: Secondary | ICD-10-CM | POA: Diagnosis not present

## 2023-02-07 DIAGNOSIS — I340 Nonrheumatic mitral (valve) insufficiency: Secondary | ICD-10-CM

## 2023-02-07 DIAGNOSIS — I493 Ventricular premature depolarization: Secondary | ICD-10-CM

## 2023-02-07 DIAGNOSIS — E782 Mixed hyperlipidemia: Secondary | ICD-10-CM

## 2023-02-07 NOTE — Patient Instructions (Addendum)
Medication Instructions:  No changes at this time.   *If you need a refill on your cardiac medications before your next appointment, please call your pharmacy*   Lab Work: None  If you have labs (blood work) drawn today and your tests are completely normal, you will receive your results only by: [place] (if you have MyChart) OR A paper copy in the mail If you have any lab test that is abnormal or we need to change your treatment, we will call you to review the results.   Testing/Procedures: Your physician has requested that you have an echocardiogram. Echocardiography is a painless test that uses sound waves to create images of your heart. It provides your doctor with information about the size and shape of your heart and how well your heart's chambers and valves are working. This procedure takes approximately one hour. There are no restrictions for this procedure. Please do NOT wear cologne, perfume, aftershave, or lotions (deodorant is allowed). Please arrive 15 minutes prior to your appointment time.    Follow-Up: At [facility] you and your health needs are our priority.  As part of our continuing mission to provide you with exceptional heart care, we have created designated Provider Care Teams.  These Care Teams include your primary Cardiologist (physician) and Advanced Practice Providers (APPs -  Physician Assistants and Nurse Practitioners) who all work together to provide you with the care you need, when you need it.   Your next appointment:   6 month(s)  Provider:   [name], MD or [name], PA-C

## 2023-02-13 ENCOUNTER — Encounter: Payer: Self-pay | Admitting: Ophthalmology

## 2023-02-13 ENCOUNTER — Ambulatory Visit: Payer: HMO | Admitting: Pulmonary Disease

## 2023-02-17 NOTE — Discharge Instructions (Signed)

## 2023-02-19 ENCOUNTER — Ambulatory Visit: Payer: PPO | Admitting: Anesthesiology

## 2023-02-19 ENCOUNTER — Encounter: Payer: Self-pay | Admitting: Ophthalmology

## 2023-02-19 ENCOUNTER — Other Ambulatory Visit: Payer: Self-pay

## 2023-02-19 ENCOUNTER — Encounter
Admission: RE | Disposition: A | Discharge status: Home / Self Care | Payer: Self-pay | Source: Home / Self Care | Attending: Ophthalmology

## 2023-02-19 ENCOUNTER — Ambulatory Visit
Admission: RE | Admit: 2023-02-19 | Discharge: 2023-02-19 | Disposition: A | Discharge status: Home / Self Care | Payer: PPO | Attending: Ophthalmology | Admitting: Ophthalmology

## 2023-02-19 DIAGNOSIS — K219 Gastro-esophageal reflux disease without esophagitis: Secondary | ICD-10-CM | POA: Insufficient documentation

## 2023-02-19 DIAGNOSIS — M1990 Unspecified osteoarthritis, unspecified site: Secondary | ICD-10-CM | POA: Insufficient documentation

## 2023-02-19 DIAGNOSIS — E119 Type 2 diabetes mellitus without complications: Secondary | ICD-10-CM | POA: Insufficient documentation

## 2023-02-19 DIAGNOSIS — F32A Depression, unspecified: Secondary | ICD-10-CM | POA: Diagnosis not present

## 2023-02-19 DIAGNOSIS — E785 Hyperlipidemia, unspecified: Secondary | ICD-10-CM | POA: Diagnosis not present

## 2023-02-19 DIAGNOSIS — J45909 Unspecified asthma, uncomplicated: Secondary | ICD-10-CM | POA: Diagnosis not present

## 2023-02-19 DIAGNOSIS — G4733 Obstructive sleep apnea (adult) (pediatric): Secondary | ICD-10-CM | POA: Insufficient documentation

## 2023-02-19 DIAGNOSIS — E1136 Type 2 diabetes mellitus with diabetic cataract: Secondary | ICD-10-CM | POA: Insufficient documentation

## 2023-02-19 DIAGNOSIS — F419 Anxiety disorder, unspecified: Secondary | ICD-10-CM | POA: Diagnosis not present

## 2023-02-19 DIAGNOSIS — K760 Fatty (change of) liver, not elsewhere classified: Secondary | ICD-10-CM | POA: Diagnosis not present

## 2023-02-19 DIAGNOSIS — I341 Nonrheumatic mitral (valve) prolapse: Secondary | ICD-10-CM | POA: Diagnosis not present

## 2023-02-19 DIAGNOSIS — T7840XA Allergy, unspecified, initial encounter: Secondary | ICD-10-CM | POA: Diagnosis not present

## 2023-02-19 DIAGNOSIS — I10 Essential (primary) hypertension: Secondary | ICD-10-CM | POA: Insufficient documentation

## 2023-02-19 DIAGNOSIS — H2512 Age-related nuclear cataract, left eye: Secondary | ICD-10-CM | POA: Diagnosis not present

## 2023-02-19 DIAGNOSIS — H2511 Age-related nuclear cataract, right eye: Secondary | ICD-10-CM | POA: Diagnosis not present

## 2023-02-19 HISTORY — DX: Dizziness and giddiness: R42

## 2023-02-19 HISTORY — PX: CATARACT EXTRACTION W/PHACO: SHX586

## 2023-02-19 LAB — GLUCOSE, CAPILLARY: Glucose-Capillary: 187 mg/dL — ABNORMAL HIGH (ref 70–99)

## 2023-02-19 SURGERY — PHACOEMULSIFICATION, CATARACT, WITH IOL INSERTION
Anesthesia: Monitor Anesthesia Care | Site: Eye | Laterality: Right

## 2023-02-19 MED ORDER — MIDAZOLAM HCL 2 MG/2ML IJ SOLN
INTRAMUSCULAR | Status: DC | PRN
  Administered 2023-02-19: 1 mg via INTRAVENOUS

## 2023-02-19 MED ORDER — SIGHTPATH DOSE#1 BSS IO SOLN
INTRAOCULAR | Status: DC | PRN
  Administered 2023-02-19: 1 mL via INTRAMUSCULAR

## 2023-02-19 MED ORDER — SODIUM CHLORIDE 0.9% FLUSH
INTRAVENOUS | Status: DC | PRN
  Administered 2023-02-19: 10 mL via INTRAVENOUS

## 2023-02-19 MED ORDER — BRIMONIDINE TARTRATE-TIMOLOL 0.2-0.5 % OP SOLN
OPHTHALMIC | Status: DC | PRN
  Administered 2023-02-19: 1 [drp] via OPHTHALMIC

## 2023-02-19 MED ORDER — TETRACAINE HCL 0.5 % OP SOLN
1.0000 [drp] | OPHTHALMIC | Status: DC | PRN
  Administered 2023-02-19 (×3): 1 [drp] via OPHTHALMIC

## 2023-02-19 MED ORDER — SIGHTPATH DOSE#1 BSS IO SOLN
INTRAOCULAR | Status: DC | PRN
  Administered 2023-02-19: 69 mL via OPHTHALMIC

## 2023-02-19 MED ORDER — CEFUROXIME OPHTHALMIC INJECTION 1 MG/0.1 ML
INJECTION | OPHTHALMIC | Status: DC | PRN
  Administered 2023-02-19: .1 mL via INTRACAMERAL

## 2023-02-19 MED ORDER — LACTATED RINGERS IV SOLN: INTRAVENOUS | Status: DC

## 2023-02-19 MED ORDER — SIGHTPATH DOSE#1 BSS IO SOLN
INTRAOCULAR | Status: DC | PRN
  Administered 2023-02-19: 15 mL

## 2023-02-19 MED ORDER — SIGHTPATH DOSE#1 NA HYALUR & NA CHOND-NA HYALUR IO KIT
PACK | INTRAOCULAR | Status: DC | PRN
  Administered 2023-02-19: 1 via OPHTHALMIC

## 2023-02-19 MED ORDER — ARMC OPHTHALMIC DILATING DROPS
1.0000 | OPHTHALMIC | Status: DC | PRN
  Administered 2023-02-19 (×3): 1 via OPHTHALMIC

## 2023-02-19 MED ORDER — FENTANYL CITRATE (PF) 100 MCG/2ML IJ SOLN
INTRAMUSCULAR | Status: DC | PRN
  Administered 2023-02-19: 50 ug via INTRAVENOUS

## 2023-02-19 SURGICAL SUPPLY — 10 items
CATARACT SUITE SIGHTPATH (MISCELLANEOUS) ×1 IMPLANT
FEE CATARACT SUITE SIGHTPATH (MISCELLANEOUS) ×1 IMPLANT
GLOVE SRG 8 PF TXTR STRL LF DI (GLOVE) ×1 IMPLANT
GLOVE SURG ENC TEXT LTX SZ7.5 (GLOVE) ×1 IMPLANT
GLOVE SURG UNDER POLY LF SZ8 (GLOVE) ×1
LENS IOL TECNIS EYHANCE 20.5 (Intraocular Lens) IMPLANT
NDL FILTER BLUNT 18X1 1/2 (NEEDLE) ×1 IMPLANT
NEEDLE FILTER BLUNT 18X1 1/2 (NEEDLE) ×1 IMPLANT
SYR 3ML LL SCALE MARK (SYRINGE) ×1 IMPLANT
WATER STERILE IRR 250ML POUR (IV SOLUTION) ×1 IMPLANT

## 2023-02-19 NOTE — Anesthesia Preprocedure Evaluation (Signed)
Anesthesia Evaluation  Patient identified by MRN, date of birth, ID band Patient awake    Reviewed: Allergy & Precautions, NPO status , Patient's Chart, lab work & pertinent test results  History of Anesthesia Complications (+) PONV and history of anesthetic complications  Airway Mallampati: III  TM Distance: <3 FB Neck ROM: full    Dental  (+) Chipped   Pulmonary asthma , sleep apnea    Pulmonary exam normal        Cardiovascular Exercise Tolerance: Good hypertension, (-) angina Normal cardiovascular exam+ dysrhythmias      Neuro/Psych  Headaches PSYCHIATRIC DISORDERS         GI/Hepatic Neg liver ROS, hiatal hernia,GERD  ,,  Endo/Other  diabetes, Type 2    Renal/GU      Musculoskeletal   Abdominal   Peds  Hematology negative hematology ROS (+)   Anesthesia Other Findings Past Medical History: No date: Allergy     Comment:  hay fever No date: Anemia 04/08/2014: Ankle fracture, left No date: Anxiety and depression No date: Arthritis No date: Asthma     Comment:  well controlled  No date: Blood transfusion abn reaction or complication, no procedure  mishap No date: Blood transfusion without reported diagnosis 08/2020: COVID-19 virus infection No date: Depression No date: Diabetes mellitus 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, lumbar No date: Dysrhythmia     Comment:  pvc's No date: Fatty liver No date: GERD (gastroesophageal reflux disease) No date: Headache(784.0) No date: History of hiatal hernia     Comment:  small No date: Hyperlipidemia No date: Hypertension No date: Joint pain No date: Low vitamin B12 level No date: Low vitamin D level No date: Migraine No date: Mitral valve prolapse No date: Obstructive sleep apnea     Comment:  CPAP No date: Osteoarthritis No date: Palpitations No date: PONV (postoperative nausea and vomiting)     Comment:  h/o in the past 04/06/2017: Sleep apnea     Comment:   uses CPAP No date: SOB (shortness of breath) No date: UTI (lower urinary tract infection) No date: Vertigo     Comment:  1 episode, several years ago No date: Vitamin B 12 deficiency No date: Vitamin D deficiency No date: Wears glasses  Past Surgical History: 1977: APPENDECTOMY last 11/17/2014: COLONOSCOPY 11/01/2020: INGUINAL HERNIA REPAIR; Right     Comment:  Procedure: HERNIA REPAIR INGUINAL ADULT;  Surgeon:               Robert Bellow, MD;  Location: [facility];  Service:               General;  Laterality: Right; No date: JOINT REPLACEMENT 03/03/2019: KNEE ARTHROPLASTY; Right     Comment:  Procedure: COMPUTER ASSISTED TOTAL KNEE               ARTHROPLASTY-RIGHT;  Surgeon: Dereck Leep, MD;                Location: [facility];  Service: Orthopedics;  Laterality:               Right; 02/06/2018: LUMBAR LAMINECTOMY/DECOMPRESSION MICRODISCECTOMY; Right     Comment:  Procedure: Lumbar two Hemilaminectomy for Discectomy;                Surgeon: Ashok Pall, MD;  Location: [place];  Service:               Neurosurgery;  Laterality: Right; 04/08/2014: ORIF ANKLE FRACTURE; Left  Comment:  Procedure: OPEN REDUCTION INTERNAL FIXATION (ORIF) ANKLE              FRACTURE LEFT ANKLE FRACTURE OPEN TREATMENT BIMALLEOLAR               ANKLE INCLUDES INTERNAL FIXATION;  Surgeon: Johnny Bridge, MD;  Location: [place];                Service: Orthopedics;  Laterality: Left; 1980: PAROTIDECTOMY     Comment:  left No date: POLYPECTOMY No date: TONSILLECTOMY AND ADENOIDECTOMY     Comment:  as a child No date: TOTAL KNEE ARTHROPLASTY; Bilateral     Comment:  left 05/1976: TUBAL LIGATION  BMI    Body Mass Index: 33.83 kg/m      Reproductive/Obstetrics negative OB ROS                             Anesthesia Physical Anesthesia Plan  ASA: 3  Anesthesia Plan: MAC   Post-op Pain Management:    Induction: Intravenous  PONV  Risk Score and Plan:   Airway Management Planned: Natural Airway and Nasal Cannula  Additional Equipment:   Intra-op Plan:   Post-operative Plan:   Informed Consent: I have reviewed the patients History and Physical, chart, labs and discussed the procedure including the risks, benefits and alternatives for the proposed anesthesia with the patient or authorized representative who has indicated his/her understanding and acceptance.     Dental Advisory Given  Plan Discussed with: Anesthesiologist, CRNA and Surgeon  Anesthesia Plan Comments: (Patient consented for risks of anesthesia including but not limited to:  - adverse reactions to medications - damage to eyes, teeth, lips or other oral mucosa - nerve damage due to positioning  - sore throat or hoarseness - Damage to heart, brain, nerves, lungs, other parts of body or loss of life  Patient voiced understanding.)       Anesthesia Quick Evaluation

## 2023-02-19 NOTE — Anesthesia Postprocedure Evaluation (Signed)
Anesthesia Post Note  Patient: Vanessa Higgins  Procedure(s) Performed: CATARACT EXTRACTION PHACO AND INTRAOCULAR LENS PLACEMENT (IOC) RIGHT DIABETIC  7.13  00:46.7 (Right: Eye)  Patient location during evaluation: Phase II Anesthesia Type: MAC Level of consciousness: awake and alert Pain management: pain level controlled Vital Signs Assessment: post-procedure vital signs reviewed and stable Respiratory status: spontaneous breathing, nonlabored ventilation, respiratory function stable and patient connected to nasal cannula oxygen Cardiovascular status: stable and blood pressure returned to baseline Postop Assessment: no apparent nausea or vomiting Anesthetic complications: no   No notable events documented.   Last Vitals:  Vitals:   02/19/23 0901 02/19/23 0906  BP: 113/62 115/61  Pulse: 69 66  Resp: 19 13  Temp: (!) 36.1 C (!) 36.1 C  SpO2: 94% 94%    Last Pain:  Vitals:   02/19/23 0906  TempSrc:   PainSc: 0-No pain                 Precious Haws Ainsleigh Kakos

## 2023-02-19 NOTE — Addendum Note (Signed)
Addendum  created 02/19/23 [id number] by Mahnoor Mathisen, Precious Haws, MD   Attestation recorded in [place], [place] filed

## 2023-02-19 NOTE — Anesthesia Procedure Notes (Signed)
Procedure Name: MAC Date/Time: 02/19/2023 8:45 AM  Performed by: Hilbert Odor, CRNAPre-anesthesia Checklist: Patient identified, Emergency Drugs available, Suction available, Patient being monitored and Timeout performed Patient Re-evaluated:Patient Re-evaluated prior to induction Oxygen Delivery Method: Nasal cannula

## 2023-02-19 NOTE — Transfer of Care (Signed)
Immediate Anesthesia Transfer of Care Note  Patient: Vanessa Higgins  Procedure(s) Performed: CATARACT EXTRACTION PHACO AND INTRAOCULAR LENS PLACEMENT (IOC) RIGHT DIABETIC  7.13  00:46.7 (Right: Eye)  Patient Location: PACU  Anesthesia Type: MAC  Level of Consciousness: awake, alert  and patient cooperative  Airway and Oxygen Therapy: Patient Spontanous Breathing and Patient connected to supplemental oxygen  Post-op Assessment: Post-op Vital signs reviewed, Patient's Cardiovascular Status Stable, Respiratory Function Stable, Patent Airway and No signs of Nausea or vomiting  Post-op Vital Signs: Reviewed and stable  Complications: No notable events documented.

## 2023-02-19 NOTE — H&P (Signed)
[facility]   Primary Care Physician:  Burnard Hawthorne, FNP Ophthalmologist: Dr. Leandrew Koyanagi  Pre-Procedure History & Physical: HPI:  Vanessa Higgins is a 72 y.o. female here for ophthalmic surgery.   Past Medical History:  Diagnosis Date   Allergy    hay fever   Anemia    Ankle fracture, left 04/08/2014   Anxiety and depression    Arthritis    Asthma    well controlled    Blood transfusion abn reaction or complication, no procedure mishap    Blood transfusion without reported diagnosis    COVID-19 virus infection 08/2020   Depression    Diabetes mellitus    Disc displacement, lumbar    Dysrhythmia    pvc's   Fatty liver    GERD (gastroesophageal reflux disease)    Headache(784.0)    History of hiatal hernia    small   Hyperlipidemia    Hypertension    Joint pain    Low vitamin B12 level    Low vitamin D level    Migraine    Mitral valve prolapse    Obstructive sleep apnea    CPAP   Osteoarthritis    Palpitations    PONV (postoperative nausea and vomiting)    h/o in the past   Sleep apnea 04/06/2017   uses CPAP   SOB (shortness of breath)    UTI (lower urinary tract infection)    Vertigo    1 episode, several years ago   Vitamin B 12 deficiency    Vitamin D deficiency    Wears glasses     Past Surgical History:  Procedure Laterality Date   APPENDECTOMY  1977   COLONOSCOPY  last 11/17/2014   INGUINAL HERNIA REPAIR Right 11/01/2020   Procedure: HERNIA REPAIR INGUINAL ADULT;  Surgeon: Robert Bellow, MD;  Location: [facility];  Service: General;  Laterality: Right;   JOINT REPLACEMENT     KNEE ARTHROPLASTY Right 03/03/2019   Procedure: COMPUTER ASSISTED TOTAL KNEE ARTHROPLASTY-RIGHT;  Surgeon: Dereck Leep, MD;  Location: [facility];  Service: Orthopedics;  Laterality: Right;   LUMBAR LAMINECTOMY/DECOMPRESSION MICRODISCECTOMY Right 02/06/2018   Procedure: Lumbar two Hemilaminectomy for Discectomy;  Surgeon: Ashok Pall, MD;  Location:  [place];  Service: Neurosurgery;  Laterality: Right;   ORIF ANKLE FRACTURE Left 04/08/2014   Procedure: OPEN REDUCTION INTERNAL FIXATION (ORIF) ANKLE FRACTURE LEFT ANKLE FRACTURE OPEN TREATMENT BIMALLEOLAR ANKLE INCLUDES INTERNAL FIXATION;  Surgeon: Johnny Bridge, MD;  Location: [place];  Service: Orthopedics;  Laterality: Left;   PAROTIDECTOMY  1980   left   POLYPECTOMY     TONSILLECTOMY AND ADENOIDECTOMY     as a child   TOTAL KNEE ARTHROPLASTY Bilateral    left   TUBAL LIGATION  05/1976    Prior to Admission medications   Medication Sig Start Date End Date Taking? Authorizing Provider  albuterol (VENTOLIN HFA) 108 (90 Base) MCG/ACT inhaler TAKE 2 PUFFS BY MOUTH EVERY 6 HOURS AS NEEDED FOR WHEEZE OR SHORTNESS OF BREATH 12/17/22  Yes Tyler Pita, MD  atorvastatin (LIPITOR) 20 MG tablet TAKE 1 TABLET BY MOUTH EVERYDAY AT BEDTIME 10/03/22  Yes Burnard Hawthorne, FNP  Budeson-Glycopyrrol-Formoterol (BREZTRI AEROSPHERE) 160-9-4.8 MCG/ACT AERO Inhale 2 puffs into the lungs in the morning and at bedtime. 01/21/22  Yes Arnett, Yvetta Coder, FNP  Cholecalciferol (VITAMIN D) 125 MCG (5000 UT) CAPS Take 5,000 Units by mouth daily.   Yes [provider]  dapagliflozin  propanediol (FARXIGA) 10 MG TABS tablet Take 1 tablet (10 mg total) by mouth daily before breakfast. 01/21/22  Yes Arnett, Yvetta Coder, FNP  diltiazem (CARDIZEM CD) 240 MG 24 hr capsule TAKE 1 CAPSULE BY MOUTH EVERY DAY 06/25/22  Yes End, Harrell Gave, MD  diphenoxylate-atropine (LOMOTIL) 2.5-0.025 MG tablet Take 1 tablet by mouth every 6 (six) hours as needed for diarrhea or loose stools. 11/30/21  Yes Jackquline Denmark, MD  insulin glargine (LANTUS SOLOSTAR) 100 UNIT/ML Solostar Pen Inject 6 Units into the skin every morning. 11/05/22  Yes Arnett, Yvetta Coder, FNP  Insulin Pen Needle 31G X 6 MM MISC Use one pen needle daily. 11/05/22  Yes Arnett, Yvetta Coder, FNP  loratadine (CLARITIN) 10 MG tablet Take 10 mg by mouth  at bedtime.    Yes [provider]  metFORMIN (GLUCOPHAGE-XR) 500 MG 24 hr tablet Take 2 tablets (1,000 mg total) by mouth 2 (two) times daily. 02/03/23  Yes Burnard Hawthorne, FNP  montelukast (SINGULAIR) 10 MG tablet TAKE 1 TABLET BY MOUTH EVERYDAY AT BEDTIME 10/17/22  Yes Arnett, Yvetta Coder, FNP  omeprazole (PRILOSEC) 20 MG capsule Take 20 mg by mouth daily.   Yes [provider]  ONETOUCH VERIO test strip USE 1 EACH BY OTHER ROUTE AS NEEDED FOR OTHER. FREESTYLE ELITE 12/05/22  Yes Burnard Hawthorne, FNP  pantoprazole (PROTONIX) 20 MG tablet TAKE 1 TABLET BY MOUTH EVERY DAY 06/18/22  Yes Jackquline Denmark, MD  Probiotic Product (PROBIOTIC PO) Take 1 capsule by mouth daily.   Yes [provider]  telmisartan-hydrochlorothiazide (MICARDIS HCT) 80-25 MG tablet TAKE 1 TABLET BY MOUTH EVERY DAY 12/05/22  Yes Burnard Hawthorne, FNP  venlafaxine XR (EFFEXOR-XR) 150 MG 24 hr capsule TAKE 1 CAPSULE BY MOUTH DAILY WITH BREAKFAST. 09/18/22 09/18/23 Yes Arnett, Yvetta Coder, FNP  vitamin B-12 (CYANOCOBALAMIN) 1000 MCG tablet Take 1,000 mcg by mouth at bedtime.   Yes [provider]  busPIRone (BUSPAR) 5 MG tablet Take 1 tablet (5 mg total) by mouth 3 (three) times daily as needed. Patient not taking: Reported on 02/13/2023 10/28/22   Burnard Hawthorne, FNP  ondansetron (ZOFRAN) 4 MG tablet Take 1 tablet (4 mg total) by mouth every 8 (eight) hours as needed for nausea or vomiting. Patient not taking: Reported on 10/28/2022 03/12/22   Burnard Hawthorne, FNP    Allergies as of 02/04/2023 - Review Complete 11/20/2022  Allergen Reaction Noted   Sulfa antibiotics Hives, Shortness Of Breath, and Rash 07/09/2012   Lisinopril Cough 05/10/2020   Other Other (See Comments) and Nausea And Vomiting 02/14/2021   Ramipril Cough 08/28/2016    Family History  Problem Relation Age of Onset   Sudden death Mother    Cancer Mother 67       pancreatic   Heart disease Father 29   Sudden death  Father    Cancer Sister 77       brain   Cancer Brother        colon   Colon cancer Brother 10   Stroke Maternal Grandmother    Heart disease Maternal Grandfather    Aneurysm Paternal Aunt    Breast cancer Neg Hx    Colon polyps Neg Hx    Esophageal cancer Neg Hx    Rectal cancer Neg Hx    Stomach cancer Neg Hx    Thyroid cancer Neg Hx     Social History   Socioeconomic History   Marital status: Widowed    Spouse name:  Not on file   Number of children: Not on file   Years of education: Not on file   Highest education level: Not on file  Occupational History   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 Husband deceased March 10, 2008.      Work - [employer] [employer]- [employer], [employer]   Diet - healthy   Exercise - gym 2 x per week   Social Determinants of Health   Financial Resource Strain: Medium Risk (01/21/2022)   Overall Financial Resource Strain (CARDIA)    Difficulty of Paying Living Expenses: Somewhat hard  Food Insecurity: No Food Insecurity (01/07/2022)   Hunger Vital Sign    Worried About Running Out of Food in the Last Year: Never true    Ran Out of Food in the Last Year: Never true  Transportation Needs: No Transportation Needs (01/07/2022)   PRAPARE - Hydrologist (Medical): No    Lack of Transportation (Non-Medical): No  Physical Activity: Not on file  Stress: No Stress Concern Present (01/07/2022)   [place]    Feeling of Stress : Not at all  Social Connections: Unknown (01/07/2022)   Social Connection and Isolation Panel [NHANES]    Frequency of Communication  with Friends and Family: More than three times a week    Frequency of Social Gatherings with Friends and Family: More than three times a week    Attends Religious Services: Not on file    Active Member of [place] or Organizations: Not on file    Attends Archivist Meetings: Not on file    Marital Status: Not on file  Intimate Partner Violence: Not At Risk (01/07/2022)   Humiliation, Afraid, Rape, and Kick questionnaire    Fear of Current or Ex-Partner: No    Emotionally Abused: No    Physically Abused: No    Sexually Abused: No    Review of Systems: See HPI, otherwise negative ROS  Physical Exam: BP 128/61   Pulse 66   Temp 98.3 F (36.8 C) (Temporal)   Resp 13   Ht 5' 3"$  (1.6 m)   Wt 86.6 kg   SpO2 94%   BMI 33.83 kg/m  General:   Alert,  pleasant and cooperative in NAD Head:  Normocephalic and atraumatic. Lungs:  Clear to auscultation.    Heart:  Regular rate and rhythm.   Impression/Plan: Vanessa Higgins is here for ophthalmic surgery.  Risks, benefits, limitations, and alternatives regarding ophthalmic surgery have been reviewed with the patient.  Questions have been answered.  All parties agreeable.   Leandrew Koyanagi, MD  02/19/2023, 8:17 AM

## 2023-02-19 NOTE — Op Note (Signed)
  LOCATION:  [place]   PREOPERATIVE DIAGNOSIS:    Nuclear sclerotic cataract right eye. H25.11   POSTOPERATIVE DIAGNOSIS:  Nuclear sclerotic cataract right eye.     PROCEDURE:  Phacoemusification with posterior chamber intraocular lens placement of the right eye   ULTRASOUND TIME: Procedure(s) with comments: CATARACT EXTRACTION PHACO AND INTRAOCULAR LENS PLACEMENT (IOC) RIGHT DIABETIC  7.13  00:46.7 (Right) - Diabetic  LENS:   Implant Name Type Inv. Item Serial No. Manufacturer Lot No. LRB No. Used Action  LENS IOL TECNIS EYHANCE 20.5 - [id number] Intraocular Lens LENS IOL TECNIS EYHANCE 20.5 [id number] [employer]  Right 1 Implanted         SURGEON:  Wyonia Hough, MD   ANESTHESIA:  Topical with tetracaine drops and 2% Xylocaine jelly, augmented with 1% preservative-free intracameral lidocaine.    COMPLICATIONS:  None.   DESCRIPTION OF PROCEDURE:  The patient was identified in the holding room and transported to the operating room and placed in the supine position under the operating microscope.  The right eye was identified as the operative eye and it was prepped and draped in the usual sterile ophthalmic fashion.   A 1 millimeter clear-corneal paracentesis was made at the 12:00 position.  0.5 ml of preservative-free 1% lidocaine was injected into the anterior chamber. The anterior chamber was filled with Viscoat viscoelastic.  A 2.4 millimeter keratome was used to make a near-clear corneal incision at the 9:00 position.  A curvilinear capsulorrhexis was made with a cystotome and capsulorrhexis forceps.  Balanced salt solution was used to hydrodissect and hydrodelineate the nucleus.   Phacoemulsification was then used in stop and chop fashion to remove the lens nucleus and epinucleus.  The remaining cortex was then removed using the irrigation and aspiration handpiece. Provisc was then placed into the capsular bag to distend it for lens placement.  A lens was then  injected into the capsular bag.  The remaining viscoelastic was aspirated.   Wounds were hydrated with balanced salt solution.  The anterior chamber was inflated to a physiologic pressure with balanced salt solution.  No wound leaks were noted. Cefuroxime 0.1 ml of a 31m/ml solution was injected into the anterior chamber for a dose of 1 mg of intracameral antibiotic at the completion of the case.   Timolol and Brimonidine drops were applied to the eye.  The patient was taken to the recovery room in stable condition without complications of anesthesia or surgery.   Hriday Stai 02/19/2023, 8:58 AM

## 2023-02-20 ENCOUNTER — Other Ambulatory Visit: Payer: Self-pay

## 2023-02-20 ENCOUNTER — Encounter: Payer: Self-pay | Admitting: Ophthalmology

## 2023-02-21 ENCOUNTER — Ambulatory Visit: Payer: HMO | Admitting: Family

## 2023-02-25 DIAGNOSIS — H2512 Age-related nuclear cataract, left eye: Secondary | ICD-10-CM | POA: Diagnosis not present

## 2023-03-03 ENCOUNTER — Other Ambulatory Visit: Payer: Self-pay

## 2023-03-03 ENCOUNTER — Telehealth: Payer: Self-pay | Admitting: Family

## 2023-03-03 DIAGNOSIS — Z794 Long term (current) use of insulin: Secondary | ICD-10-CM

## 2023-03-03 MED ORDER — DAPAGLIFLOZIN PROPANEDIOL 10 MG PO TABS: 10.0000 mg | ORAL_TABLET | Freq: Every day | ORAL | 3 refills | Status: DC

## 2023-03-03 NOTE — Telephone Encounter (Signed)
Vanessa Higgins from [place] assistance program called to get a refill sent in for dapagliflozin propanediol (FARXIGA) 10 MG TABS tablet  She said the fax is [phone] Call back is [phone] No reference number

## 2023-03-03 NOTE — Discharge Instructions (Signed)

## 2023-03-05 ENCOUNTER — Encounter: Payer: Self-pay | Admitting: Ophthalmology

## 2023-03-05 ENCOUNTER — Ambulatory Visit: Payer: PPO | Admitting: Anesthesiology

## 2023-03-05 ENCOUNTER — Encounter
Admission: RE | Disposition: A | Discharge status: Home / Self Care | Payer: Self-pay | Source: Home / Self Care | Attending: Ophthalmology

## 2023-03-05 ENCOUNTER — Other Ambulatory Visit: Payer: Self-pay

## 2023-03-05 ENCOUNTER — Ambulatory Visit
Admission: RE | Admit: 2023-03-05 | Discharge: 2023-03-05 | Disposition: A | Discharge status: Home / Self Care | Payer: PPO | Attending: Ophthalmology | Admitting: Ophthalmology

## 2023-03-05 DIAGNOSIS — E669 Obesity, unspecified: Secondary | ICD-10-CM | POA: Diagnosis not present

## 2023-03-05 DIAGNOSIS — Z6834 Body mass index (BMI) 34.0-34.9, adult: Secondary | ICD-10-CM | POA: Diagnosis not present

## 2023-03-05 DIAGNOSIS — K219 Gastro-esophageal reflux disease without esophagitis: Secondary | ICD-10-CM | POA: Diagnosis not present

## 2023-03-05 DIAGNOSIS — E1136 Type 2 diabetes mellitus with diabetic cataract: Secondary | ICD-10-CM | POA: Diagnosis not present

## 2023-03-05 DIAGNOSIS — I10 Essential (primary) hypertension: Secondary | ICD-10-CM | POA: Diagnosis not present

## 2023-03-05 DIAGNOSIS — H2512 Age-related nuclear cataract, left eye: Secondary | ICD-10-CM | POA: Diagnosis not present

## 2023-03-05 DIAGNOSIS — K449 Diaphragmatic hernia without obstruction or gangrene: Secondary | ICD-10-CM | POA: Diagnosis not present

## 2023-03-05 DIAGNOSIS — Z794 Long term (current) use of insulin: Secondary | ICD-10-CM | POA: Insufficient documentation

## 2023-03-05 DIAGNOSIS — J45909 Unspecified asthma, uncomplicated: Secondary | ICD-10-CM | POA: Diagnosis not present

## 2023-03-05 DIAGNOSIS — G4733 Obstructive sleep apnea (adult) (pediatric): Secondary | ICD-10-CM | POA: Diagnosis not present

## 2023-03-05 DIAGNOSIS — H2511 Age-related nuclear cataract, right eye: Secondary | ICD-10-CM | POA: Diagnosis not present

## 2023-03-05 DIAGNOSIS — T7840XA Allergy, unspecified, initial encounter: Secondary | ICD-10-CM | POA: Diagnosis not present

## 2023-03-05 HISTORY — PX: CATARACT EXTRACTION W/PHACO: SHX586

## 2023-03-05 LAB — GLUCOSE, CAPILLARY: Glucose-Capillary: 197 mg/dL — ABNORMAL HIGH (ref 70–99)

## 2023-03-05 SURGERY — PHACOEMULSIFICATION, CATARACT, WITH IOL INSERTION
Anesthesia: Monitor Anesthesia Care | Site: Eye | Laterality: Left

## 2023-03-05 MED ORDER — LIDOCAINE HCL (PF) 2 % IJ SOLN: INTRAOCULAR | Status: DC | PRN

## 2023-03-05 MED ORDER — LACTATED RINGERS IV SOLN: INTRAVENOUS | Status: DC

## 2023-03-05 MED ORDER — BRIMONIDINE TARTRATE-TIMOLOL 0.2-0.5 % OP SOLN
OPHTHALMIC | Status: DC | PRN
  Administered 2023-03-05: 1 [drp] via OPHTHALMIC

## 2023-03-05 MED ORDER — SIGHTPATH DOSE#1 NA HYALUR & NA CHOND-NA HYALUR IO KIT
PACK | INTRAOCULAR | Status: DC | PRN
  Administered 2023-03-05: 1 via OPHTHALMIC

## 2023-03-05 MED ORDER — CEFUROXIME OPHTHALMIC INJECTION 1 MG/0.1 ML
INJECTION | OPHTHALMIC | Status: DC | PRN
  Administered 2023-03-05: .1 mL via INTRACAMERAL

## 2023-03-05 MED ORDER — FENTANYL CITRATE (PF) 100 MCG/2ML IJ SOLN
INTRAMUSCULAR | Status: DC | PRN
  Administered 2023-03-05: 100 ug via INTRAVENOUS

## 2023-03-05 MED ORDER — SIGHTPATH DOSE#1 BSS IO SOLN
INTRAOCULAR | Status: DC | PRN
  Administered 2023-03-05: 1 mL via INTRAMUSCULAR

## 2023-03-05 MED ORDER — SIGHTPATH DOSE#1 BSS IO SOLN
INTRAOCULAR | Status: DC | PRN
  Administered 2023-03-05: 53 mL via OPHTHALMIC

## 2023-03-05 MED ORDER — MIDAZOLAM HCL 2 MG/2ML IJ SOLN
INTRAMUSCULAR | Status: DC | PRN
  Administered 2023-03-05: 2 mg via INTRAVENOUS

## 2023-03-05 MED ORDER — ARMC OPHTHALMIC DILATING DROPS
1.0000 | OPHTHALMIC | Status: DC | PRN
  Administered 2023-03-05 (×3): 1 via OPHTHALMIC

## 2023-03-05 MED ORDER — SIGHTPATH DOSE#1 BSS IO SOLN
INTRAOCULAR | Status: DC | PRN
  Administered 2023-03-05: 15 mL

## 2023-03-05 MED ORDER — TETRACAINE HCL 0.5 % OP SOLN
1.0000 [drp] | OPHTHALMIC | Status: DC | PRN
  Administered 2023-03-05 (×3): 1 [drp] via OPHTHALMIC

## 2023-03-05 SURGICAL SUPPLY — 10 items
CATARACT SUITE SIGHTPATH (MISCELLANEOUS) ×1 IMPLANT
FEE CATARACT SUITE SIGHTPATH (MISCELLANEOUS) ×1 IMPLANT
GLOVE SRG 8 PF TXTR STRL LF DI (GLOVE) ×1 IMPLANT
GLOVE SURG ENC TEXT LTX SZ7.5 (GLOVE) ×1 IMPLANT
GLOVE SURG UNDER POLY LF SZ8 (GLOVE) ×1
LENS IOL TECNIS EYHANCE 21.0 (Intraocular Lens) IMPLANT
NDL FILTER BLUNT 18X1 1/2 (NEEDLE) ×1 IMPLANT
NEEDLE FILTER BLUNT 18X1 1/2 (NEEDLE) ×1 IMPLANT
SYR 3ML LL SCALE MARK (SYRINGE) ×1 IMPLANT
WATER STERILE IRR 250ML POUR (IV SOLUTION) ×1 IMPLANT

## 2023-03-05 NOTE — Transfer of Care (Signed)
Immediate Anesthesia Transfer of Care Note  Patient: Vanessa Higgins  Procedure(s) Performed: CATARACT EXTRACTION PHACO AND INTRAOCULAR LENS PLACEMENT (IOC) LEFT DIABETIC  6.03  00:36.4 (Left: Eye)  Patient Location: PACU  Anesthesia Type: MAC  Level of Consciousness: awake, alert  and patient cooperative  Airway and Oxygen Therapy: Patient Spontanous Breathing and Patient connected to supplemental oxygen  Post-op Assessment: Post-op Vital signs reviewed, Patient's Cardiovascular Status Stable, Respiratory Function Stable, Patent Airway and No signs of Nausea or vomiting  Post-op Vital Signs: Reviewed and stable  Complications: No notable events documented.

## 2023-03-05 NOTE — Anesthesia Preprocedure Evaluation (Signed)
Anesthesia Evaluation  Patient identified by MRN, date of birth, ID band Patient awake    Reviewed: Allergy & Precautions, NPO status , Patient's Chart, lab work & pertinent test results  History of Anesthesia Complications (+) PONV and history of anesthetic complications  Airway Mallampati: III  TM Distance: <3 FB Neck ROM: full    Dental  (+) Chipped   Pulmonary asthma , sleep apnea    Pulmonary exam normal        Cardiovascular Exercise Tolerance: Good hypertension, (-) angina Normal cardiovascular exam+ dysrhythmias      Neuro/Psych  Headaches PSYCHIATRIC DISORDERS         GI/Hepatic Neg liver ROS, hiatal hernia,GERD  ,,  Endo/Other  diabetes, Type 2    Renal/GU      Musculoskeletal   Abdominal   Peds  Hematology negative hematology ROS (+)   Anesthesia Other Findings Past Medical History: No date: Allergy     Comment:  hay fever No date: Anemia 04/08/2014: Ankle fracture, left No date: Anxiety and depression No date: Arthritis No date: Asthma     Comment:  well controlled  No date: Blood transfusion abn reaction or complication, no procedure  mishap No date: Blood transfusion without reported diagnosis 08/2020: COVID-19 virus infection No date: Depression No date: Diabetes mellitus 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, lumbar No date: Dysrhythmia     Comment:  pvc's No date: Fatty liver No date: GERD (gastroesophageal reflux disease) No date: Headache(784.0) No date: History of hiatal hernia     Comment:  small No date: Hyperlipidemia No date: Hypertension No date: Joint pain No date: Low vitamin B12 level No date: Low vitamin D level No date: Migraine No date: Mitral valve prolapse No date: Obstructive sleep apnea     Comment:  CPAP No date: Osteoarthritis No date: Palpitations No date: PONV (postoperative nausea and vomiting)     Comment:  h/o in the past 04/06/2017: Sleep apnea     Comment:   uses CPAP No date: SOB (shortness of breath) No date: UTI (lower urinary tract infection) No date: Vertigo     Comment:  1 episode, several years ago No date: Vitamin B 12 deficiency No date: Vitamin D deficiency No date: Wears glasses  Past Surgical History: 1977: APPENDECTOMY last 11/17/2014: COLONOSCOPY 11/01/2020: INGUINAL HERNIA REPAIR; Right     Comment:  Procedure: HERNIA REPAIR INGUINAL ADULT;  Surgeon:               Robert Bellow, MD;  Location: [facility];  Service:               General;  Laterality: Right; No date: JOINT REPLACEMENT 03/03/2019: KNEE ARTHROPLASTY; Right     Comment:  Procedure: COMPUTER ASSISTED TOTAL KNEE               ARTHROPLASTY-RIGHT;  Surgeon: Dereck Leep, MD;                Location: [facility];  Service: Orthopedics;  Laterality:               Right; 02/06/2018: LUMBAR LAMINECTOMY/DECOMPRESSION MICRODISCECTOMY; Right     Comment:  Procedure: Lumbar two Hemilaminectomy for Discectomy;                Surgeon: Ashok Pall, MD;  Location: [place];  Service:               Neurosurgery;  Laterality: Right; 04/08/2014: ORIF ANKLE FRACTURE; Left  Comment:  Procedure: OPEN REDUCTION INTERNAL FIXATION (ORIF) ANKLE              FRACTURE LEFT ANKLE FRACTURE OPEN TREATMENT BIMALLEOLAR               ANKLE INCLUDES INTERNAL FIXATION;  Surgeon: Johnny Bridge, MD;  Location: [place];                Service: Orthopedics;  Laterality: Left; 1980: PAROTIDECTOMY     Comment:  left No date: POLYPECTOMY No date: TONSILLECTOMY AND ADENOIDECTOMY     Comment:  as a child No date: TOTAL KNEE ARTHROPLASTY; Bilateral     Comment:  left 05/1976: TUBAL LIGATION  BMI    Body Mass Index: 33.83 kg/m      Reproductive/Obstetrics negative OB ROS                              Anesthesia Physical Anesthesia Plan  ASA: 3  Anesthesia Plan: MAC   Post-op Pain Management:    Induction: Intravenous  PONV  Risk Score and Plan:   Airway Management Planned: Natural Airway and Nasal Cannula  Additional Equipment:   Intra-op Plan:   Post-operative Plan:   Informed Consent: I have reviewed the patients History and Physical, chart, labs and discussed the procedure including the risks, benefits and alternatives for the proposed anesthesia with the patient or authorized representative who has indicated his/her understanding and acceptance.     Dental Advisory Given  Plan Discussed with: Anesthesiologist, CRNA and Surgeon  Anesthesia Plan Comments: (Patient consented for risks of anesthesia including but not limited to:  - adverse reactions to medications - damage to eyes, teeth, lips or other oral mucosa - nerve damage due to positioning  - sore throat or hoarseness - Damage to heart, brain, nerves, lungs, other parts of body or loss of life  Patient voiced understanding.)        Anesthesia Quick Evaluation

## 2023-03-05 NOTE — H&P (Signed)
[facility]   Primary Care Physician:  Burnard Hawthorne, FNP Ophthalmologist: Dr. Leandrew Koyanagi  Pre-Procedure History & Physical: HPI:  Vanessa Higgins is a 72 y.o. female here for ophthalmic surgery.   Past Medical History:  Diagnosis Date   Allergy    hay fever   Anemia    Ankle fracture, left 04/08/2014   Anxiety and depression    Arthritis    Asthma    well controlled    Blood transfusion abn reaction or complication, no procedure mishap    Blood transfusion without reported diagnosis    COVID-19 virus infection 08/2020   Depression    Diabetes mellitus    Disc displacement, lumbar    Dysrhythmia    pvc's   Fatty liver    GERD (gastroesophageal reflux disease)    Headache(784.0)    History of hiatal hernia    small   Hyperlipidemia    Hypertension    Joint pain    Low vitamin B12 level    Low vitamin D level    Migraine    Mitral valve prolapse    Obstructive sleep apnea    CPAP   Osteoarthritis    Palpitations    PONV (postoperative nausea and vomiting)    h/o in the past   Sleep apnea 04/06/2017   uses CPAP   SOB (shortness of breath)    UTI (lower urinary tract infection)    Vertigo    1 episode, several years ago   Vitamin B 12 deficiency    Vitamin D deficiency    Wears glasses     Past Surgical History:  Procedure Laterality Date   APPENDECTOMY  1977   CATARACT EXTRACTION W/PHACO Right 02/19/2023   Procedure: CATARACT EXTRACTION PHACO AND INTRAOCULAR LENS PLACEMENT ([place]) RIGHT DIABETIC  7.13  00:46.7;  Surgeon: Leandrew Koyanagi, MD;  Location: [facility];  Service: Ophthalmology;  Laterality: Right;  Diabetic   COLONOSCOPY  last 11/17/2014   INGUINAL HERNIA REPAIR Right 11/01/2020   Procedure: HERNIA REPAIR INGUINAL ADULT;  Surgeon: Robert Bellow, MD;  Location: [facility];  Service: General;  Laterality: Right;   JOINT REPLACEMENT     KNEE ARTHROPLASTY Right 03/03/2019   Procedure: COMPUTER ASSISTED TOTAL KNEE  ARTHROPLASTY-RIGHT;  Surgeon: Dereck Leep, MD;  Location: [facility];  Service: Orthopedics;  Laterality: Right;   LUMBAR LAMINECTOMY/DECOMPRESSION MICRODISCECTOMY Right 02/06/2018   Procedure: Lumbar two Hemilaminectomy for Discectomy;  Surgeon: Ashok Pall, MD;  Location: [place];  Service: Neurosurgery;  Laterality: Right;   ORIF ANKLE FRACTURE Left 04/08/2014   Procedure: OPEN REDUCTION INTERNAL FIXATION (ORIF) ANKLE FRACTURE LEFT ANKLE FRACTURE OPEN TREATMENT BIMALLEOLAR ANKLE INCLUDES INTERNAL FIXATION;  Surgeon: Johnny Bridge, MD;  Location: [place];  Service: Orthopedics;  Laterality: Left;   PAROTIDECTOMY  1980   left   POLYPECTOMY     TONSILLECTOMY AND ADENOIDECTOMY     as a child   TOTAL KNEE ARTHROPLASTY Bilateral    left   TUBAL LIGATION  05/1976    Prior to Admission medications   Medication Sig Start Date End Date Taking? Authorizing Provider  albuterol (VENTOLIN HFA) 108 (90 Base) MCG/ACT inhaler TAKE 2 PUFFS BY MOUTH EVERY 6 HOURS AS NEEDED FOR WHEEZE OR SHORTNESS OF BREATH 12/17/22  Yes Vanessa Pita, MD  atorvastatin (LIPITOR) 20 MG tablet TAKE 1 TABLET BY MOUTH EVERYDAY AT BEDTIME 10/03/22  Yes Burnard Hawthorne, FNP  Budeson-Glycopyrrol-Formoterol (BREZTRI AEROSPHERE) 160-9-4.8 MCG/ACT AERO  Inhale 2 puffs into the lungs in the morning and at bedtime. 01/21/22  Yes Arnett, Yvetta Coder, FNP  Cholecalciferol (VITAMIN D) 125 MCG (5000 UT) CAPS Take 5,000 Units by mouth daily.   Yes [provider]  dapagliflozin propanediol (FARXIGA) 10 MG TABS tablet Take 1 tablet (10 mg total) by mouth daily before breakfast. 03/03/23  Yes Arnett, Yvetta Coder, FNP  diltiazem (CARDIZEM CD) 240 MG 24 hr capsule TAKE 1 CAPSULE BY MOUTH EVERY DAY 06/25/22  Yes End, Harrell Gave, MD  diphenoxylate-atropine (LOMOTIL) 2.5-0.025 MG tablet Take 1 tablet by mouth every 6 (six) hours as needed for diarrhea or loose stools. 11/30/21  Yes Jackquline Denmark, MD  insulin glargine  (LANTUS SOLOSTAR) 100 UNIT/ML Solostar Pen Inject 6 Units into the skin every morning. 11/05/22  Yes Arnett, Yvetta Coder, FNP  loratadine (CLARITIN) 10 MG tablet Take 10 mg by mouth at bedtime.    Yes [provider]  metFORMIN (GLUCOPHAGE-XR) 500 MG 24 hr tablet Take 2 tablets (1,000 mg total) by mouth 2 (two) times daily. 02/03/23  Yes Burnard Hawthorne, FNP  montelukast (SINGULAIR) 10 MG tablet TAKE 1 TABLET BY MOUTH EVERYDAY AT BEDTIME 10/17/22  Yes Arnett, Yvetta Coder, FNP  omeprazole (PRILOSEC) 20 MG capsule Take 20 mg by mouth daily.   Yes [provider]  Probiotic Product (PROBIOTIC PO) Take 1 capsule by mouth daily.   Yes [provider]  telmisartan-hydrochlorothiazide (MICARDIS HCT) 80-25 MG tablet TAKE 1 TABLET BY MOUTH EVERY DAY 12/05/22  Yes Burnard Hawthorne, FNP  venlafaxine XR (EFFEXOR-XR) 150 MG 24 hr capsule TAKE 1 CAPSULE BY MOUTH DAILY WITH BREAKFAST. 09/18/22 09/18/23 Yes Arnett, Yvetta Coder, FNP  vitamin B-12 (CYANOCOBALAMIN) 1000 MCG tablet Take 1,000 mcg by mouth at bedtime.   Yes [provider]  busPIRone (BUSPAR) 5 MG tablet Take 1 tablet (5 mg total) by mouth 3 (three) times daily as needed. Patient not taking: Reported on 02/13/2023 10/28/22   Burnard Hawthorne, FNP  Insulin Pen Needle 31G X 6 MM MISC Use one pen needle daily. 11/05/22   Burnard Hawthorne, FNP  ondansetron (ZOFRAN) 4 MG tablet Take 1 tablet (4 mg total) by mouth every 8 (eight) hours as needed for nausea or vomiting. Patient not taking: Reported on 10/28/2022 03/12/22   Burnard Hawthorne, FNP  ONETOUCH VERIO test strip USE 1 EACH BY OTHER ROUTE AS NEEDED FOR OTHER. FREESTYLE ELITE 12/05/22   Burnard Hawthorne, FNP  pantoprazole (PROTONIX) 20 MG tablet TAKE 1 TABLET BY MOUTH EVERY DAY Patient not taking: Reported on 03/05/2023 06/18/22   Jackquline Denmark, MD    Allergies as of 02/04/2023 - Review Complete 11/20/2022  Allergen Reaction Noted   Sulfa antibiotics Hives, Shortness  Of Breath, and Rash 07/09/2012   Lisinopril Cough 05/10/2020   Other Other (See Comments) and Nausea And Vomiting 02/14/2021   Ramipril Cough 08/28/2016    Family History  Problem Relation Age of Onset   Sudden death Mother    Cancer Mother 33       pancreatic   Heart disease Father 50   Sudden death Father    Cancer Sister 65       brain   Cancer Brother        colon   Colon cancer Brother 8   Stroke Maternal Grandmother    Heart disease Maternal Grandfather    Aneurysm Paternal Aunt    Breast cancer Neg Hx    Colon polyps Neg Hx  Esophageal cancer Neg Hx    Rectal cancer Neg Hx    Stomach cancer Neg Hx    Thyroid cancer Neg Hx     Social History   Socioeconomic History   Marital status: Widowed    Spouse name: Not on file   Number of children: Not on file   Years of education: Not on file   Highest education level: Not on file  Occupational History   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 Husband deceased Mar 12, 2008.      Work - [place]- [employer], [employer]   Diet - healthy   Exercise - gym 2 x per week   Social Determinants of Health   Financial Resource Strain: Medium Risk (01/21/2022)   Overall Financial Resource Strain (CARDIA)    Difficulty of Paying Living Expenses: Somewhat hard  Food Insecurity: No Food Insecurity (01/07/2022)   Hunger Vital Sign    Worried About Running Out of Food in the Last Year: Never true    Ran Out of Food in the Last Year: Never true  Transportation Needs: No Transportation Needs (01/07/2022)   PRAPARE - Hydrologist (Medical): No    Lack of Transportation (Non-Medical): No   Physical Activity: Not on file  Stress: No Stress Concern Present (01/07/2022)   [place]    Feeling of Stress : Not at all  Social Connections: Unknown (01/07/2022)   Social Connection and Isolation Panel [NHANES]    Frequency of Communication with Friends and Family: More than three times a week    Frequency of Social Gatherings with Friends and Family: More than three times a week    Attends Religious Services: Not on file    Active Member of [employer] or Organizations: Not on file    Attends Archivist Meetings: Not on file    Marital Status: Not on file  Intimate Partner Violence: Not At Risk (01/07/2022)   Humiliation, Afraid, Rape, and Kick questionnaire    Fear of Current or Ex-Partner: No    Emotionally Abused: No    Physically Abused: No    Sexually Abused: No    Review of Systems: See HPI, otherwise negative ROS  Physical Exam: BP 131/71   Pulse 70   Temp (!) 97.5 F (36.4 C) (Temporal)   Resp 13   Ht '5\' 3"'$  (1.6 m)   Wt 87.1 kg   SpO2 98%   BMI 34.01 kg/m  General:   Alert,  pleasant and cooperative in NAD Head:  Normocephalic and atraumatic. Lungs:  Clear to auscultation.    Heart:  Regular rate and rhythm.   Impression/Plan: RELENA MCADAM is here for ophthalmic surgery.  Risks, benefits, limitations, and alternatives regarding ophthalmic surgery have been reviewed with the patient.  Questions have been answered.  All parties agreeable.   Leandrew Koyanagi, MD  03/05/2023, 7:30 AM

## 2023-03-05 NOTE — Telephone Encounter (Signed)
Rx signed and given to [facility]

## 2023-03-05 NOTE — Telephone Encounter (Signed)
Faxed on 03/05/23

## 2023-03-05 NOTE — Op Note (Signed)
OPERATIVE NOTE  Vanessa Higgins [id number] 03/05/2023   PREOPERATIVE DIAGNOSIS:  Nuclear sclerotic cataract left eye. H25.12   POSTOPERATIVE DIAGNOSIS:    Nuclear sclerotic cataract left eye.     PROCEDURE:  Phacoemusification with posterior chamber intraocular lens placement of the left eye  Ultrasound time: Procedure(s) with comments: CATARACT EXTRACTION PHACO AND INTRAOCULAR LENS PLACEMENT ([facility]) LEFT DIABETIC  6.03  00:36.4 (Left) - Diabetic  LENS:   Implant Name Type Inv. Item Serial No. Manufacturer Lot No. LRB No. Used Action  LENS IOL TECNIS EYHANCE 21.0 - [id number] Intraocular Lens LENS IOL TECNIS EYHANCE 21.0 [id number] [employer]  Left 1 Implanted      SURGEON:  Wyonia Hough, MD   ANESTHESIA:  Topical with tetracaine drops and 2% Xylocaine jelly, augmented with 1% preservative-free intracameral lidocaine.    COMPLICATIONS:  None.   DESCRIPTION OF PROCEDURE:  The patient was identified in the holding room and transported to the operating room and placed in the supine position under the operating microscope.  The left eye was identified as the operative eye and it was prepped and draped in the usual sterile ophthalmic fashion.   A 1 millimeter clear-corneal paracentesis was made at the 1:30 position.  0.5 ml of preservative-free 1% lidocaine was injected into the anterior chamber.  The anterior chamber was filled with Viscoat viscoelastic.  A 2.4 millimeter keratome was used to make a near-clear corneal incision at the 10:30 position.  .  A curvilinear capsulorrhexis was made with a cystotome and capsulorrhexis forceps.  Balanced salt solution was used to hydrodissect and hydrodelineate the nucleus.   Phacoemulsification was then used in stop and chop fashion to remove the lens nucleus and epinucleus.  The remaining cortex was then removed using the irrigation and aspiration handpiece. Provisc was then placed into the capsular bag to distend it for lens placement.  A  lens was then injected into the capsular bag.  The remaining viscoelastic was aspirated.   Wounds were hydrated with balanced salt solution.  The anterior chamber was inflated to a physiologic pressure with balanced salt solution.  No wound leaks were noted. Cefuroxime 0.1 ml of a '10mg'$ /ml solution was injected into the anterior chamber for a dose of 1 mg of intracameral antibiotic at the completion of the case.   Timolol and Brimonidine drops were applied to the eye.  The patient was taken to the recovery room in stable condition without complications of anesthesia or surgery.  [name] 03/05/2023, 7:53 AM

## 2023-03-05 NOTE — Anesthesia Postprocedure Evaluation (Signed)
Anesthesia Post Note  Patient: ZYASIA KRITZER  Procedure(s) Performed: CATARACT EXTRACTION PHACO AND INTRAOCULAR LENS PLACEMENT ([facility]) LEFT DIABETIC  6.03  00:36.4 (Left: Eye)  Patient location during evaluation: PACU Anesthesia Type: MAC Level of consciousness: awake and alert Pain management: pain level controlled Vital Signs Assessment: post-procedure vital signs reviewed and stable Respiratory status: spontaneous breathing, nonlabored ventilation, respiratory function stable and patient connected to nasal cannula oxygen Cardiovascular status: blood pressure returned to baseline and stable Postop Assessment: no apparent nausea or vomiting Anesthetic complications: no   No notable events documented.   Last Vitals:  Vitals:   03/05/23 0755 03/05/23 0757  BP: 116/62 (!) 107/55  Pulse: 68 65  Resp: 19 13  Temp: 36.6 C   SpO2: 96% 93%    Last Pain:  Vitals:   03/05/23 0757  TempSrc:   PainSc: 0-No pain                 Precious Haws Dorathy Stallone

## 2023-03-06 ENCOUNTER — Encounter: Payer: Self-pay | Admitting: Ophthalmology

## 2023-03-14 ENCOUNTER — Ambulatory Visit (INDEPENDENT_AMBULATORY_CARE_PROVIDER_SITE_OTHER): Payer: PPO | Admitting: Family

## 2023-03-14 ENCOUNTER — Encounter: Payer: Self-pay | Admitting: Family

## 2023-03-14 VITALS — BP 118/86 | HR 71 | Temp 97.9°F | Ht 63.0 in | Wt 191.6 lb

## 2023-03-14 DIAGNOSIS — I10 Essential (primary) hypertension: Secondary | ICD-10-CM

## 2023-03-14 DIAGNOSIS — Z794 Long term (current) use of insulin: Secondary | ICD-10-CM | POA: Diagnosis not present

## 2023-03-14 DIAGNOSIS — E6609 Other obesity due to excess calories: Secondary | ICD-10-CM | POA: Diagnosis not present

## 2023-03-14 DIAGNOSIS — E1169 Type 2 diabetes mellitus with other specified complication: Secondary | ICD-10-CM

## 2023-03-14 DIAGNOSIS — R7309 Other abnormal glucose: Secondary | ICD-10-CM

## 2023-03-14 DIAGNOSIS — Z6833 Body mass index (BMI) 33.0-33.9, adult: Secondary | ICD-10-CM

## 2023-03-14 LAB — POCT GLYCOSYLATED HEMOGLOBIN (HGB A1C): Hemoglobin A1C: 8.5 % — AB (ref 4.0–5.6)

## 2023-03-14 LAB — BASIC METABOLIC PANEL
BUN: 23 mg/dL (ref 6–23)
CO2: 31 mEq/L (ref 19–32)
Calcium: 10 mg/dL (ref 8.4–10.5)
Chloride: 99 mEq/L (ref 96–112)
Creatinine, Ser: 0.81 mg/dL (ref 0.40–1.20)
GFR: 71.92 mL/min (ref >60.00)
Glucose, Bld: 168 mg/dL — ABNORMAL HIGH (ref 70–99)
Potassium: 4.1 mEq/L (ref 3.5–5.1)
Sodium: 139 mEq/L (ref 135–145)

## 2023-03-14 NOTE — Patient Instructions (Addendum)
Increase lantus from 6 units to 9 units.  Increase by one unit every week until fasting blood sugar approaches 150.   Referral to  [facility]. Ultrasound of legs.   Let us know if you dont hear back within a week in regards to an appointment being scheduled.   So that you are aware, if you are [facility] MyChart user , please pay attention to your MyChart messages as you may receive a MyChart message with a phone number to call and schedule this test/appointment own your own from our referral coordinator. This is a new process so I do not want you to miss this message.  If you are not a MyChart user, you will receive a phone call.

## 2023-03-14 NOTE — Assessment & Plan Note (Signed)
Lab Results  Component Value Date   HGBA1C 8.5 (A) 03/14/2023   Continue Farxiga 10 mg, metformin 1000 mg twice daily. Increase lantus from 6 units qam to 9 units. Advised increase lantus by one unit every week until fasting blood sugar approaches 150.  Foot exam revealed diminished pedal pulses bilaterally.  Pending ABI.

## 2023-03-14 NOTE — Progress Notes (Signed)
Assessment & Plan:  Type 2 diabetes mellitus with other specified complication, with long-term current use of insulin (HCC) Assessment & Plan: Lab Results  Component Value Date   HGBA1C 8.5 (A) 03/14/2023   Continue Farxiga 10 mg, metformin 1000 mg twice daily. Increase lantus from 6 units qam to 9 units. Advised increase lantus by one unit every week until fasting blood sugar approaches 150.  Foot exam revealed diminished pedal pulses bilaterally.  Pending ABI.   Orders: -     VAS [name] LOWER EXTREMITY ARTERIAL DUPLEX; Future  Elevated glucose -     POCT glycosylated hemoglobin (Hb A1C)  Essential hypertension Assessment & Plan: Chronic, stable.  Continue diltiazem 240 mg, telmisartan-hydrochlorothiazide 80-25 mg  Orders: -     Basic metabolic panel  Class 1 obesity due to excess calories without serious comorbidity with body mass index (BMI) of 33.0 to 33.9 in adult -     Amb Ref to Medical Weight Management     Return precautions given.   Risks, benefits, and alternatives of the medications and treatment plan prescribed today were discussed, and patient expressed understanding.   Education regarding symptom management and diagnosis given to patient on AVS either electronically or printed.  No follow-ups on file.  Mable Paris, FNP  Subjective:    Patient ID: Vanessa Higgins, female    DOB: 1951-01-02, 72 y.o.   MRN: [id number]  CC: Vanessa Higgins is a 72 y.o. female who presents today for follow up.   HPI: DM- FBG 183, 195, 185, 172 Postprandial 198, 159, 176.   Compliant with metformin , farxiga, lantus 6 units.     Allergies: Sulfa antibiotics, Lisinopril, Other, and Ramipril Current Outpatient Medications on File Prior to Visit  Medication Sig Dispense Refill   albuterol (VENTOLIN HFA) 108 (90 Base) MCG/ACT inhaler TAKE 2 PUFFS BY MOUTH EVERY 6 HOURS AS NEEDED FOR WHEEZE OR SHORTNESS OF BREATH 18 each 2   atorvastatin (LIPITOR) 20 MG tablet TAKE 1 TABLET  BY MOUTH EVERYDAY AT BEDTIME 90 tablet 1   Budeson-Glycopyrrol-Formoterol (BREZTRI AEROSPHERE) 160-9-4.8 MCG/ACT AERO Inhale 2 puffs into the lungs in the morning and at bedtime. 32.1 g 3   Cholecalciferol (VITAMIN D) 125 MCG (5000 UT) CAPS Take 5,000 Units by mouth daily.     dapagliflozin propanediol (FARXIGA) 10 MG TABS tablet Take 1 tablet (10 mg total) by mouth daily before breakfast. 90 tablet 3   diltiazem (CARDIZEM CD) 240 MG 24 hr capsule TAKE 1 CAPSULE BY MOUTH EVERY DAY 90 capsule 2   diphenoxylate-atropine (LOMOTIL) 2.5-0.025 MG tablet Take 1 tablet by mouth every 6 (six) hours as needed for diarrhea or loose stools. 30 tablet 1   insulin glargine (LANTUS SOLOSTAR) 100 UNIT/ML Solostar Pen Inject 6 Units into the skin every morning. 15 mL 1   Insulin Pen Needle 31G X 6 MM MISC Use one pen needle daily. 90 each 3   loratadine (CLARITIN) 10 MG tablet Take 10 mg by mouth at bedtime.      metFORMIN (GLUCOPHAGE-XR) 500 MG 24 hr tablet Take 2 tablets (1,000 mg total) by mouth 2 (two) times daily. 360 tablet 1   montelukast (SINGULAIR) 10 MG tablet TAKE 1 TABLET BY MOUTH EVERYDAY AT BEDTIME 90 tablet 1   omeprazole (PRILOSEC) 20 MG capsule Take 20 mg by mouth daily.     ondansetron (ZOFRAN) 4 MG tablet Take 1 tablet (4 mg total) by mouth every 8 (eight) hours as needed for nausea or  vomiting. 20 tablet 0   ONETOUCH VERIO test strip USE 1 EACH BY OTHER ROUTE AS NEEDED FOR OTHER. FREESTYLE ELITE 100 strip 3   Probiotic Product (PROBIOTIC PO) Take 1 capsule by mouth daily.     telmisartan-hydrochlorothiazide (MICARDIS HCT) 80-25 MG tablet TAKE 1 TABLET BY MOUTH EVERY DAY 90 tablet 1   venlafaxine XR (EFFEXOR-XR) 150 MG 24 hr capsule TAKE 1 CAPSULE BY MOUTH DAILY WITH BREAKFAST. 90 capsule 1   vitamin B-12 (CYANOCOBALAMIN) 1000 MCG tablet Take 1,000 mcg by mouth at bedtime.     No current facility-administered medications on file prior to visit.    Review of Systems  Constitutional:  Negative  for chills and fever.  Respiratory:  Negative for cough.   Cardiovascular:  Negative for chest pain and palpitations.  Gastrointestinal:  Negative for nausea and vomiting.      Objective:    BP 118/86   Pulse 71   Temp 97.9 F (36.6 C) (Oral)   Ht 5\' 3"  (1.6 m)   Wt 191 lb 9.6 oz (86.9 kg)   SpO2 96%   BMI 33.94 kg/m  BP Readings from Last 3 Encounters:  03/14/23 118/86  03/05/23 (!) 107/55  02/19/23 115/61   Wt Readings from Last 3 Encounters:  03/14/23 191 lb 9.6 oz (86.9 kg)  03/05/23 192 lb (87.1 kg)  02/19/23 191 lb (86.6 kg)    Physical Exam Vitals reviewed.  Constitutional:      Appearance: She is well-developed.  Eyes:     Conjunctiva/sclera: Conjunctivae normal.  Cardiovascular:     Rate and Rhythm: Normal rate and regular rhythm.     Pulses: Normal pulses.     Heart sounds: Normal heart sounds.  Pulmonary:     Effort: Pulmonary effort is normal.     Breath sounds: Normal breath sounds. No wheezing, rhonchi or rales.  Skin:    General: Skin is warm and dry.  Neurological:     Mental Status: She is alert.  Psychiatric:        Speech: Speech normal.        Behavior: Behavior normal.        Thought Content: Thought content normal.

## 2023-03-14 NOTE — Assessment & Plan Note (Signed)
Chronic, stable.  Continue diltiazem 240 mg, telmisartan-hydrochlorothiazide 80-25 mg 

## 2023-03-24 ENCOUNTER — Emergency Department: Payer: PPO

## 2023-03-24 ENCOUNTER — Emergency Department
Admission: EM | Admit: 2023-03-24 | Discharge: 2023-03-24 | Disposition: A | Discharge status: Home / Self Care | Payer: PPO | Attending: Emergency Medicine | Admitting: Emergency Medicine

## 2023-03-24 ENCOUNTER — Other Ambulatory Visit: Payer: Self-pay

## 2023-03-24 ENCOUNTER — Encounter: Payer: Self-pay | Admitting: Emergency Medicine

## 2023-03-24 DIAGNOSIS — R0789 Other chest pain: Secondary | ICD-10-CM | POA: Insufficient documentation

## 2023-03-24 DIAGNOSIS — R944 Abnormal results of kidney function studies: Secondary | ICD-10-CM | POA: Insufficient documentation

## 2023-03-24 DIAGNOSIS — I10 Essential (primary) hypertension: Secondary | ICD-10-CM | POA: Insufficient documentation

## 2023-03-24 DIAGNOSIS — Z794 Long term (current) use of insulin: Secondary | ICD-10-CM | POA: Insufficient documentation

## 2023-03-24 DIAGNOSIS — J45909 Unspecified asthma, uncomplicated: Secondary | ICD-10-CM | POA: Insufficient documentation

## 2023-03-24 DIAGNOSIS — Z8616 Personal history of COVID-19: Secondary | ICD-10-CM | POA: Diagnosis not present

## 2023-03-24 DIAGNOSIS — Z20822 Contact with and (suspected) exposure to covid-19: Secondary | ICD-10-CM | POA: Diagnosis not present

## 2023-03-24 DIAGNOSIS — J439 Emphysema, unspecified: Secondary | ICD-10-CM | POA: Diagnosis not present

## 2023-03-24 DIAGNOSIS — R0602 Shortness of breath: Secondary | ICD-10-CM | POA: Diagnosis not present

## 2023-03-24 DIAGNOSIS — J449 Chronic obstructive pulmonary disease, unspecified: Secondary | ICD-10-CM | POA: Diagnosis not present

## 2023-03-24 DIAGNOSIS — E1165 Type 2 diabetes mellitus with hyperglycemia: Secondary | ICD-10-CM | POA: Diagnosis not present

## 2023-03-24 DIAGNOSIS — Z96653 Presence of artificial knee joint, bilateral: Secondary | ICD-10-CM | POA: Insufficient documentation

## 2023-03-24 DIAGNOSIS — Z79899 Other long term (current) drug therapy: Secondary | ICD-10-CM | POA: Insufficient documentation

## 2023-03-24 DIAGNOSIS — Z7984 Long term (current) use of oral hypoglycemic drugs: Secondary | ICD-10-CM | POA: Insufficient documentation

## 2023-03-24 DIAGNOSIS — R079 Chest pain, unspecified: Secondary | ICD-10-CM

## 2023-03-24 LAB — BASIC METABOLIC PANEL
Anion gap: 12 (ref 5–15)
BUN: 25 mg/dL — ABNORMAL HIGH (ref 8–23)
CO2: 28 mmol/L (ref 22–32)
Calcium: 9.6 mg/dL (ref 8.9–10.3)
Chloride: 97 mmol/L — ABNORMAL LOW (ref 98–111)
Creatinine, Ser: 1.09 mg/dL — ABNORMAL HIGH (ref 0.44–1.00)
GFR, Estimated: 54 mL/min — ABNORMAL LOW (ref >60)
Glucose, Bld: 243 mg/dL — ABNORMAL HIGH (ref 70–99)
Potassium: 4.4 mmol/L (ref 3.5–5.1)
Sodium: 137 mmol/L (ref 135–145)

## 2023-03-24 LAB — CBC
HCT: 42.8 % (ref 36.0–46.0)
Hemoglobin: 13.5 g/dL (ref 12.0–15.0)
MCH: 30.1 pg (ref 26.0–34.0)
MCHC: 31.5 g/dL (ref 30.0–36.0)
MCV: 95.5 fL (ref 80.0–100.0)
Platelets: 253 10*3/uL (ref 150–400)
RBC: 4.48 MIL/uL (ref 3.87–5.11)
RDW: 13.8 % (ref 11.5–15.5)
WBC: 6.9 10*3/uL (ref 4.0–10.5)
nRBC: 0 % (ref 0.0–0.2)

## 2023-03-24 LAB — RESP PANEL BY RT-PCR (RSV, FLU A&B, COVID)  RVPGX2
Influenza A by PCR: NEGATIVE
Influenza B by PCR: NEGATIVE
Resp Syncytial Virus by PCR: NEGATIVE
SARS Coronavirus 2 by RT PCR: NEGATIVE

## 2023-03-24 LAB — TROPONIN I (HIGH SENSITIVITY)
Troponin I (High Sensitivity): 4 ng/L (ref <18)
Troponin I (High Sensitivity): 4 ng/L (ref <18)

## 2023-03-24 MED ORDER — NITROGLYCERIN 0.4 MG SL SUBL
0.4000 mg | SUBLINGUAL_TABLET | SUBLINGUAL | Status: DC | PRN
  Filled 2023-03-24: qty 1

## 2023-03-24 MED ORDER — ASPIRIN 81 MG PO CHEW
324.0000 mg | CHEWABLE_TABLET | Freq: Once | ORAL | Status: AC
  Administered 2023-03-24: 324 mg via ORAL
  Filled 2023-03-24: qty 4

## 2023-03-24 NOTE — Discharge Instructions (Signed)
Your workup was reassuring without evidence of heart attack you have resolution of your symptoms however is important that if you develop return of symptoms or changes symptoms that you return to the ER for repeat evaluation or any other concerns.  Otherwise call your cardiologist to make a follow-up appointment.

## 2023-03-24 NOTE — ED Notes (Signed)
Primary RN went over discharge instructions with pt and pt verbalized understanding. Pt ambulatory to get dressed. Pt family at bedside.

## 2023-03-24 NOTE — ED Provider Notes (Signed)
[facility] Provider Note    Event Date/Time   First MD Initiated Contact with Patient 03/24/23 [phone]     (approximate)   History   Chest Pain   HPI  Vanessa Higgins is a 72 y.o. female with history of asthma, hypertension, diabetes, hyperlipidemia, PVCs who presents to the emergency department with chest tightness that she woke up with from sleep around 4:45 AM.  Pain radiates into the right shoulder.  Has had nausea but no vomiting.  No diaphoresis, dizziness.  No fevers, cough.  No history of PE, DVT, exogenous estrogen use, recent fractures, surgery (other than cataract surgery 2 weeks ago), trauma, hospitalization, prolonged travel or other immobilization. No lower extremity swelling or pain. No calf tenderness.  Patient works here in the hospital and endoscopy.  She denies any history of coronary artery disease, CHF.  Patient arrives by private vehicle.   History provided by patient, family.    Past Medical History:  Diagnosis Date   Allergy    hay fever   Anemia    Ankle fracture, left 04/08/2014   Anxiety and depression    Arthritis    Asthma    well controlled    Blood transfusion abn reaction or complication, no procedure mishap    Blood transfusion without reported diagnosis    COVID-19 virus infection 08/2020   Depression    Diabetes mellitus    Disc displacement, lumbar    Dysrhythmia    pvc's   Fatty liver    GERD (gastroesophageal reflux disease)    Headache(784.0)    History of hiatal hernia    small   Hyperlipidemia    Hypertension    Joint pain    Low vitamin B12 level    Low vitamin D level    Migraine    Mitral valve prolapse    Obstructive sleep apnea    CPAP   Osteoarthritis    Palpitations    PONV (postoperative nausea and vomiting)    h/o in the past   Sleep apnea 04/06/2017   uses CPAP   SOB (shortness of breath)    UTI (lower urinary tract infection)    Vertigo    1 episode, several years ago   Vitamin B  12 deficiency    Vitamin D deficiency    Wears glasses     Past Surgical History:  Procedure Laterality Date   APPENDECTOMY  1977   CATARACT EXTRACTION W/PHACO Right 02/19/2023   Procedure: CATARACT EXTRACTION PHACO AND INTRAOCULAR LENS PLACEMENT ([place]) RIGHT DIABETIC  7.13  00:46.7;  Surgeon: Leandrew Koyanagi, MD;  Location: [place];  Service: Ophthalmology;  Laterality: Right;  Diabetic   CATARACT EXTRACTION W/PHACO Left 03/05/2023   Procedure: CATARACT EXTRACTION PHACO AND INTRAOCULAR LENS PLACEMENT ([facility]) LEFT DIABETIC  6.03  00:36.4;  Surgeon: Leandrew Koyanagi, MD;  Location: [place];  Service: Ophthalmology;  Laterality: Left;  Diabetic   COLONOSCOPY  last 11/17/2014   INGUINAL HERNIA REPAIR Right 11/01/2020   Procedure: HERNIA REPAIR INGUINAL ADULT;  Surgeon: Robert Bellow, MD;  Location: [facility];  Service: General;  Laterality: Right;   JOINT REPLACEMENT     KNEE ARTHROPLASTY Right 03/03/2019   Procedure: COMPUTER ASSISTED TOTAL KNEE ARTHROPLASTY-RIGHT;  Surgeon: Dereck Leep, MD;  Location: [facility];  Service: Orthopedics;  Laterality: Right;   LUMBAR LAMINECTOMY/DECOMPRESSION MICRODISCECTOMY Right 02/06/2018   Procedure: Lumbar two Hemilaminectomy for Discectomy;  Surgeon: Ashok Pall, MD;  Location: [place];  Service: Neurosurgery;  Laterality: Right;   ORIF ANKLE FRACTURE Left 04/08/2014   Procedure: OPEN REDUCTION INTERNAL FIXATION (ORIF) ANKLE FRACTURE LEFT ANKLE FRACTURE OPEN TREATMENT BIMALLEOLAR ANKLE INCLUDES INTERNAL FIXATION;  Surgeon: Johnny Bridge, MD;  Location: [place];  Service: Orthopedics;  Laterality: Left;   PAROTIDECTOMY  1980   left   POLYPECTOMY     TONSILLECTOMY AND ADENOIDECTOMY     as a child   TOTAL KNEE ARTHROPLASTY Bilateral    left   TUBAL LIGATION  05/1976    MEDICATIONS:  Prior to Admission medications   Medication Sig Start Date End Date Taking? Authorizing Provider  albuterol (VENTOLIN HFA)  108 (90 Base) MCG/ACT inhaler TAKE 2 PUFFS BY MOUTH EVERY 6 HOURS AS NEEDED FOR WHEEZE OR SHORTNESS OF BREATH 12/17/22   Tyler Pita, MD  atorvastatin (LIPITOR) 20 MG tablet TAKE 1 TABLET BY MOUTH EVERYDAY AT BEDTIME 10/03/22   Burnard Hawthorne, FNP  Budeson-Glycopyrrol-Formoterol (BREZTRI AEROSPHERE) 160-9-4.8 MCG/ACT AERO Inhale 2 puffs into the lungs in the morning and at bedtime. 01/21/22   Burnard Hawthorne, FNP  Cholecalciferol (VITAMIN D) 125 MCG (5000 UT) CAPS Take 5,000 Units by mouth daily.    [provider]  dapagliflozin propanediol (FARXIGA) 10 MG TABS tablet Take 1 tablet (10 mg total) by mouth daily before breakfast. 03/03/23   Burnard Hawthorne, FNP  diltiazem (CARDIZEM CD) 240 MG 24 hr capsule TAKE 1 CAPSULE BY MOUTH EVERY DAY 06/25/22   End, Harrell Gave, MD  diphenoxylate-atropine (LOMOTIL) 2.5-0.025 MG tablet Take 1 tablet by mouth every 6 (six) hours as needed for diarrhea or loose stools. 11/30/21   Jackquline Denmark, MD  insulin glargine (LANTUS SOLOSTAR) 100 UNIT/ML Solostar Pen Inject 6 Units into the skin every morning. 11/05/22   Burnard Hawthorne, FNP  Insulin Pen Needle 31G X 6 MM MISC Use one pen needle daily. 11/05/22   Burnard Hawthorne, FNP  loratadine (CLARITIN) 10 MG tablet Take 10 mg by mouth at bedtime.     [provider]  metFORMIN (GLUCOPHAGE-XR) 500 MG 24 hr tablet Take 2 tablets (1,000 mg total) by mouth 2 (two) times daily. 02/03/23   Burnard Hawthorne, FNP  montelukast (SINGULAIR) 10 MG tablet TAKE 1 TABLET BY MOUTH EVERYDAY AT BEDTIME 10/17/22   Burnard Hawthorne, FNP  omeprazole (PRILOSEC) 20 MG capsule Take 20 mg by mouth daily.    [provider]  ondansetron (ZOFRAN) 4 MG tablet Take 1 tablet (4 mg total) by mouth every 8 (eight) hours as needed for nausea or vomiting. 03/12/22   Arnett, Yvetta Coder, FNP  ONETOUCH VERIO test strip USE 1 EACH BY OTHER ROUTE AS NEEDED FOR OTHER. FREESTYLE ELITE 12/05/22   Burnard Hawthorne, FNP   Probiotic Product (PROBIOTIC PO) Take 1 capsule by mouth daily.    [provider]  telmisartan-hydrochlorothiazide (MICARDIS HCT) 80-25 MG tablet TAKE 1 TABLET BY MOUTH EVERY DAY 12/05/22   Burnard Hawthorne, FNP  venlafaxine XR (EFFEXOR-XR) 150 MG 24 hr capsule TAKE 1 CAPSULE BY MOUTH DAILY WITH BREAKFAST. 09/18/22 09/18/23  Burnard Hawthorne, FNP  vitamin B-12 (CYANOCOBALAMIN) 1000 MCG tablet Take 1,000 mcg by mouth at bedtime.    [provider]    Physical Exam   Triage Vital Signs: ED Triage Vitals  Enc Vitals Group     BP 03/24/23 0555 (!) 123/97     Pulse Rate 03/24/23 0555 68     Resp 03/24/23 0555 16  Temp 03/24/23 0555 97.9 F (36.6 C)     Temp Source 03/24/23 0555 Oral     SpO2 03/24/23 0555 96 %     Weight 03/24/23 0552 192 lb (87.1 kg)     Height 03/24/23 0552 5\' 3"  (1.6 m)     Head Circumference --      Peak Flow --      Pain Score --      Pain Loc --      Pain Edu? --      Excl. in [place]? --     Most recent vital signs: Vitals:   03/24/23 0615 03/24/23 0630  BP: (!) 121/59 132/60  Pulse: 66 61  Resp: 17 15  Temp:    SpO2: 95% 96%    CONSTITUTIONAL: Alert, responds appropriately to questions. Well-appearing; well-nourished, elderly HEAD: Normocephalic, atraumatic EYES: Conjunctivae clear, pupils appear equal, sclera nonicteric ENT: normal nose; moist mucous membranes NECK: Supple, normal ROM CARD: RRR; S1 and S2 appreciated RESP: Normal chest excursion without splinting or tachypnea; breath sounds clear and equal bilaterally; no wheezes, no rhonchi, no rales, no hypoxia or respiratory distress, speaking full sentences ABD/GI: Non-distended; soft, non-tender, no rebound, no guarding, no peritoneal signs BACK: The back appears normal EXT: Normal ROM in all joints; no deformity noted, no edema, no calf tenderness or calf swelling SKIN: Normal color for age and race; warm; no rash on exposed skin NEURO: Moves all extremities equally,  normal speech PSYCH: The patient's mood and manner are appropriate.   ED Results / Procedures / Treatments   LABS: (all labs ordered are listed, but only abnormal results are displayed) Labs Reviewed  BASIC METABOLIC PANEL - Abnormal; Notable for the following components:      Result Value   Chloride 97 (*)    Glucose, Bld 243 (*)    BUN 25 (*)    Creatinine, Ser 1.09 (*)    GFR, Estimated 54 (*)    All other components within normal limits  RESP PANEL BY RT-PCR (RSV, FLU A&B, COVID)  RVPGX2  CBC  TROPONIN I (HIGH SENSITIVITY)     EKG:  EKG Interpretation  Date/Time:  Monday March 24 2023 05:57:04 EDT Ventricular Rate:  62 PR Interval:  108 QRS Duration: 64 QT Interval:  432 QTC Calculation: 438 R Axis:   35 Text Interpretation: Sinus rhythm with short PR Low voltage QRS Cannot rule out Anteroseptal infarct (cited on or before 15-Jun-2012) Abnormal ECG When compared with ECG of 18-Feb-2019 11:51, QRS duration has decreased Questionable change in initial forces of Anteroseptal leads Confirmed by Pryor Curia [phone]) on 03/24/2023 6:05:44 AM         RADIOLOGY: My personal review and interpretation of imaging: Chest x-ray clear.  I have personally reviewed all radiology reports.   DG Chest 2 View  Result Date: 03/24/2023 CLINICAL DATA:  Chest pains. EXAM: CHEST - 2 VIEW COMPARISON:  PA Lat 09/12/2020 FINDINGS: The lungs are mildly emphysematous. No focal pneumonia is seen. Linear scarring or atelectasis is again noted in the lingula and left lower lobe. The sulci are sharp. Heart size and vascular pattern are normal. The mediastinum is stable with mild aortic tortuosity and patchy calcification. There is osteopenia and thoracic kyphosis with multilevel degenerative discs. No spinal compression fracture. IMPRESSION: No evidence of acute chest disease. Stable COPD chest with aortic atherosclerosis. Electronically Signed   By: Telford Nab M.D.   On: 03/24/2023 07:04      PROCEDURES:  Critical Care  performed: No     .1-3 Lead EKG Interpretation  Performed by: Jenefer Woerner, Delice Bison, DO Authorized by: Sid Greener, Delice Bison, DO     Interpretation: normal     ECG rate:  68   ECG rate assessment: normal     Rhythm: sinus rhythm     Ectopy: none     Conduction: normal       IMPRESSION / MDM / ASSESSMENT AND PLAN / ED COURSE  I reviewed the triage vital signs and the nursing notes.    Patient here with chest tightness that goes into the right shoulder, shortness of breath, nausea without vomiting.  The patient is on the cardiac monitor to evaluate for evidence of arrhythmia and/or significant heart rate changes.   DIFFERENTIAL DIAGNOSIS (includes but not limited to):   ACS, less likely PE, dissection.  No infectious symptoms to suggest pneumonia but it is on the differential.  No signs of volume overload and no history of CHF.   Patient's presentation is most consistent with acute presentation with potential threat to life or bodily function.   PLAN: Will obtain CBC, BMP, troponin x 2, chest x-ray.  EKG nonischemic.  Will give aspirin, nitroglycerin.  Denies that pain is pleuritic in nature.  No risk factors for PE other than age.  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  Nursing staff has contacted the [facility] but states patient will still need to call staffing herself.  Merleen Nicely, RN has informed patient.   MEDICATIONS GIVEN IN ED: Medications  nitroGLYCERIN (NITROSTAT) SL tablet 0.4 mg (has no administration in time range)  aspirin chewable tablet 324 mg (324 mg Oral Given 03/24/23 0615)     ED COURSE: Patient's labs show no leukocytosis, normal hemoglobin.  Slightly elevated creatinine compared to baseline.  Glucose of 243 but not in DKA.  First troponin negative.  Second pending.  Chest x-ray reviewed and interpreted by myself and the radiologist and shows changes consistent  with COPD but no other acute abnormality.  COVID and flu swabs pending.  Signed out to oncoming ED physician at 7 AM to follow-up on viral panel and repeat troponin.  Patient does have a cardiologist for follow-up if discharged home.   CONSULTS:  pending further workup.   OUTSIDE RECORDS REVIEWED: Reviewed last office visit with Sarajane Jews on 12/31/2021.       FINAL CLINICAL IMPRESSION(S) / ED DIAGNOSES   Final diagnoses:  Nonspecific chest pain     Rx / DC Orders   ED Discharge Orders     None        Note:  This document was prepared using Dragon voice recognition software and may include unintentional dictation errors.   Faatimah Spielberg, Delice Bison, DO 03/24/23 [phone]

## 2023-03-24 NOTE — ED Triage Notes (Signed)
Pt presents ambulatory to triage via POV with complaints of midsternal CP with radiation to her R shoulder that started 1 hour ago. Rates the pain 5/10. No meds taken PTA. A&Ox4 at this time. Denies fevers, chills, cough, SOB.

## 2023-03-24 NOTE — ED Provider Notes (Signed)
7:55 AM Assumed care for off going team.   Blood pressure 132/60, pulse 61, temperature 97.9 F (36.6 C), temperature source Oral, resp. rate 15, height 5\' 3"  (1.6 m), weight 87.1 kg, SpO2 96 %.  See their HPI for full report but in brief pending repeat trop and probably dc  Repeat troponin negative.  COVID, flu were negative.  CBC reassuring.  BMP shows slightly elevated glucose at 243 but anion gap is normal.  8:29 AM reevaluated patient she is chest pain-free.  Her oxygen levels are 93 to 95% while I talk to her in the room.  She denies any shortness of breath pleuritic chest pain risk factors for PE.  Her legs have no swelling no calf tenderness.  She reports that this is her baseline oxygen level secondary to her known pulmonary disease from prior asthma.  I relisten to her lung I do not hear any wheezing to suggest COPD, asthma exacerbation.  She denies any history of stents.  We discussed admission versus discharge and she would prefer to go home given reassuring workup which I think is reassuring given low heart score patient has a cardiologist she can follow-up with.  We discussed that we cannot predict future heart attacks and that if she develops a change in symptoms return of symptoms that are worsening or any other concerns that she should return to the ER for repeat cardiac markers to rule out heart attack given she does have some risk factors.  However at this time she feels comfortable with discharge home.  Her abdomen is soft nontender no abdominal pain to suggest cholecystitis.  I discussed the provisional nature of ED diagnosis, the treatment so far, the ongoing plan of care, follow up appointments and return precautions with the patient and any family or support people present. They expressed understanding and agreed with the plan, discharged home.           Vanessa [place], MD 03/24/23 [phone]

## 2023-03-25 ENCOUNTER — Encounter: Payer: Self-pay | Admitting: Pulmonary Disease

## 2023-03-25 ENCOUNTER — Other Ambulatory Visit: Payer: Self-pay | Admitting: Family

## 2023-03-25 ENCOUNTER — Ambulatory Visit: Payer: PPO | Admitting: Pulmonary Disease

## 2023-03-25 VITALS — BP 130/78 | HR 85 | Temp 97.8°F | Ht 63.0 in | Wt 194.2 lb

## 2023-03-25 DIAGNOSIS — J4540 Moderate persistent asthma, uncomplicated: Secondary | ICD-10-CM | POA: Diagnosis not present

## 2023-03-25 DIAGNOSIS — J3089 Other allergic rhinitis: Secondary | ICD-10-CM

## 2023-03-25 DIAGNOSIS — G4733 Obstructive sleep apnea (adult) (pediatric): Secondary | ICD-10-CM

## 2023-03-25 DIAGNOSIS — J4489 Other specified chronic obstructive pulmonary disease: Secondary | ICD-10-CM

## 2023-03-25 DIAGNOSIS — R0989 Other specified symptoms and signs involving the circulatory and respiratory systems: Secondary | ICD-10-CM

## 2023-03-25 DIAGNOSIS — I10 Essential (primary) hypertension: Secondary | ICD-10-CM

## 2023-03-25 DIAGNOSIS — R7309 Other abnormal glucose: Secondary | ICD-10-CM

## 2023-03-25 DIAGNOSIS — E6609 Other obesity due to excess calories: Secondary | ICD-10-CM

## 2023-03-25 DIAGNOSIS — Z794 Long term (current) use of insulin: Secondary | ICD-10-CM

## 2023-03-25 NOTE — Patient Instructions (Signed)
When you see Mable Paris, FNP next asked her to see if they can switch you to "nasal pillows" see if this helps with your CPAP.  Your lungs sounded really clear today.  We will see you in follow-up in 6 months time call sooner should any new problems arise.

## 2023-03-25 NOTE — Progress Notes (Signed)
Subjective:    Patient ID: Vanessa Higgins, female    DOB: 08-06-1951, 72 y.o.   MRN: [id number] Patient Care Team: Burnard Hawthorne, FNP as PCP - General (Family Medicine) End, Harrell Gave, MD as PCP - Cardiology (Cardiology) Chief Complaint  Patient presents with   Follow-up    No SOB, wheezing or cough.     HPI Vanessa Higgins is a 72 year old lifelong never smoker who presents for follow-up of asthma with chronic asthmatic bronchitis. This is a scheduled visit.  We last saw her on 19 August 2022.  She has been on [place] and that she is doing well with this medication.  She has not had any asthmatic bronchitis exacerbations since last seen.  At her prior visit we discussed discontinuing Singulair which she did not feel helped her, she tried being off it but had issues with allergic rhinitis had to resume it. She has not had any increase in shortness of breath, cough or wheezing.  She has not had to use her rescue inhaler at all.  She continues to prefer the Breztri to the Trelegy as it keeps her controlled throughout the day. Does not endorse any fevers, chills or sweats.  No hoarseness.  Cough is very rare and usually nonproductive but for the most part this symptom is well controlled.  No lower extremity edema.  No orthopnea or paroxysmal nocturnal dyspnea.  Overall, she feels well and looks well.    She does have sleep apnea which has been managed by primary care.  She notes that the mask is giving her difficulties.  She wonders if there is a different mask.  I suggested perhaps to try nasal pillows.  She is to discuss with primary care.    DATA 11/01/2021 PFTs: FEV1 1.25 L or 59% predicted, FVC 2.23 L or 79% predicted, FEV1/FVC 56%.  No significant bronchodilator response.  Lung volumes show mild hyperinflation and air trapping.  There is significant reduction in airway resistance postbronchodilator.  Diffusion capacity normal.  Compared to PFTs 05/12/2020, no significant change.  Review of  Systems A 10 point review of systems was performed and it is as noted above otherwise negative.   Current Meds  Medication Sig   albuterol (VENTOLIN HFA) 108 (90 Base) MCG/ACT inhaler TAKE 2 PUFFS BY MOUTH EVERY 6 HOURS AS NEEDED FOR WHEEZE OR SHORTNESS OF BREATH   atorvastatin (LIPITOR) 20 MG tablet TAKE 1 TABLET BY MOUTH EVERYDAY AT BEDTIME   Budeson-Glycopyrrol-Formoterol (BREZTRI AEROSPHERE) 160-9-4.8 MCG/ACT AERO Inhale 2 puffs into the lungs in the morning and at bedtime.   Cholecalciferol (VITAMIN D) 125 MCG (5000 UT) CAPS Take 5,000 Units by mouth daily.   dapagliflozin propanediol (FARXIGA) 10 MG TABS tablet Take 1 tablet (10 mg total) by mouth daily before breakfast.   diltiazem (CARDIZEM CD) 240 MG 24 hr capsule TAKE 1 CAPSULE BY MOUTH EVERY DAY   diphenoxylate-atropine (LOMOTIL) 2.5-0.025 MG tablet Take 1 tablet by mouth every 6 (six) hours as needed for diarrhea or loose stools.   insulin glargine (LANTUS SOLOSTAR) 100 UNIT/ML Solostar Pen Inject 6 Units into the skin every morning. (Patient taking differently: Inject 9 Units into the skin every morning.)   Insulin Pen Needle 31G X 6 MM MISC Use one pen needle daily.   loratadine (CLARITIN) 10 MG tablet Take 10 mg by mouth at bedtime.    metFORMIN (GLUCOPHAGE-XR) 500 MG 24 hr tablet Take 2 tablets (1,000 mg total) by mouth 2 (two) times daily.   montelukast (SINGULAIR)  10 MG tablet TAKE 1 TABLET BY MOUTH EVERYDAY AT BEDTIME   omeprazole (PRILOSEC) 20 MG capsule Take 20 mg by mouth daily.   ondansetron (ZOFRAN) 4 MG tablet Take 1 tablet (4 mg total) by mouth every 8 (eight) hours as needed for nausea or vomiting.   ONETOUCH VERIO test strip USE 1 EACH BY OTHER ROUTE AS NEEDED FOR OTHER. FREESTYLE ELITE   Probiotic Product (PROBIOTIC PO) Take 1 capsule by mouth daily.   telmisartan-hydrochlorothiazide (MICARDIS HCT) 80-25 MG tablet TAKE 1 TABLET BY MOUTH EVERY DAY   venlafaxine XR (EFFEXOR-XR) 150 MG 24 hr capsule TAKE 1 CAPSULE BY  MOUTH DAILY WITH BREAKFAST.   vitamin B-12 (CYANOCOBALAMIN) 1000 MCG tablet Take 1,000 mcg by mouth at bedtime.       Objective:   Physical Exam BP 130/78 (BP Location: Left Arm, Cuff Size: Normal)   Pulse 85   Temp 97.8 F (36.6 C)   Ht 5\' 3"  (1.6 m)   Wt 194 lb 3.2 oz (88.1 kg)   SpO2 99%   BMI 34.40 kg/m   SpO2: 99 % O2 Device: None (Room air)  GENERAL: This is a well-developed obese woman in no acute respiratory distress.  She is fully ambulatory. No conversational dyspnea. HEAD: Normocephalic, atraumatic. EYES: Pupils equal, round, reactive to light.  No scleral icterus. MOUTH: Dentition intact, oral mucosa moist.  No thrush. NECK: Supple. No thyromegaly. No nodules. No JVD.  Trachea midline.  No crepitus. PULMONARY: Symmetrical air entry.  Moving air well.  No adventitious sounds. CARDIOVASCULAR: S1 and S2. Regular rate and rhythm.  No rubs murmurs gallops heard. GASTROINTESTINAL: Obese abdomen, soft. MUSCULOSKELETAL: No joint deformity, no clubbing, no edema.  Increased AP diameter. NEUROLOGIC: No focal deficits noted.  Speech is fluent.  Awake, alert, oriented. SKIN: Intact,warm,dry, limited exam: No rashes. PSYCH: Normal mood and behavior.     Assessment & Plan:     ICD-10-CM   1. Moderate persistent asthma without complication  123456    Continue medications as they are Patient is well-controlled    2. Asthmatic bronchitis , chronic  J44.89    Continue Breztri and as needed albuterol    3. Perennial allergic rhinitis  J30.89    Continue Singulair    4. OSA (obstructive sleep apnea)  G47.33    CPAP managed by primary care Patient states compliant Recommend trial of nasal pillows     Will see the patient in follow-up in 6 months time she is to contact us prior to that time should any new difficulties arise.  Renold Don, MD Advanced Bronchoscopy PCCM [facility]    *This note was dictated using voice recognition  software/Dragon.  Despite best efforts to proofread, errors can occur which can change the meaning. Any transcriptional errors that result from this process are unintentional and may not be fully corrected at the time of dictation.

## 2023-03-26 ENCOUNTER — Ambulatory Visit (INDEPENDENT_AMBULATORY_CARE_PROVIDER_SITE_OTHER): Payer: PPO

## 2023-03-26 DIAGNOSIS — R0989 Other specified symptoms and signs involving the circulatory and respiratory systems: Secondary | ICD-10-CM

## 2023-03-27 ENCOUNTER — Other Ambulatory Visit: Payer: Self-pay | Admitting: Gastroenterology

## 2023-03-27 ENCOUNTER — Other Ambulatory Visit: Payer: Self-pay | Admitting: Family

## 2023-03-27 ENCOUNTER — Ambulatory Visit: Payer: PPO | Attending: Physician Assistant

## 2023-03-27 ENCOUNTER — Other Ambulatory Visit: Payer: Self-pay | Admitting: Internal Medicine

## 2023-03-27 DIAGNOSIS — I351 Nonrheumatic aortic (valve) insufficiency: Secondary | ICD-10-CM

## 2023-03-27 DIAGNOSIS — K219 Gastro-esophageal reflux disease without esophagitis: Secondary | ICD-10-CM

## 2023-03-27 LAB — ECHOCARDIOGRAM COMPLETE
AV Mean grad: 5 mmHg
AV Peak grad: 9.2 mmHg
Ao pk vel: 1.52 m/s
Area-P 1/2: 2.73 cm2
P 1/2 time: 466 msec

## 2023-04-07 ENCOUNTER — Other Ambulatory Visit: Payer: Self-pay | Admitting: Family

## 2023-04-18 ENCOUNTER — Other Ambulatory Visit: Payer: Self-pay | Admitting: Family

## 2023-04-18 DIAGNOSIS — J45909 Unspecified asthma, uncomplicated: Secondary | ICD-10-CM

## 2023-04-22 ENCOUNTER — Ambulatory Visit: Payer: PPO | Attending: Medical | Admitting: Medical

## 2023-04-22 ENCOUNTER — Encounter: Payer: Self-pay | Admitting: Medical

## 2023-04-22 VITALS — BP 108/64 | HR 82 | Ht 63.0 in | Wt 198.0 lb

## 2023-04-22 DIAGNOSIS — I10 Essential (primary) hypertension: Secondary | ICD-10-CM | POA: Diagnosis not present

## 2023-04-22 DIAGNOSIS — R079 Chest pain, unspecified: Secondary | ICD-10-CM | POA: Diagnosis not present

## 2023-04-22 DIAGNOSIS — I351 Nonrheumatic aortic (valve) insufficiency: Secondary | ICD-10-CM | POA: Diagnosis not present

## 2023-04-22 DIAGNOSIS — I493 Ventricular premature depolarization: Secondary | ICD-10-CM

## 2023-04-22 DIAGNOSIS — E782 Mixed hyperlipidemia: Secondary | ICD-10-CM

## 2023-04-22 DIAGNOSIS — I340 Nonrheumatic mitral (valve) insufficiency: Secondary | ICD-10-CM

## 2023-04-22 MED ORDER — METOPROLOL TARTRATE 50 MG PO TABS: ORAL_TABLET | ORAL | 0 refills | Status: DC

## 2023-04-22 NOTE — Patient Instructions (Addendum)
Medication Instructions:  Take Metoprolol Tartrate 50 mg two hours before CT when scheduled.   *If you need a refill on your cardiac medications before your next appointment, please call your pharmacy*   Lab Work: BMET today   If you have labs (blood work) drawn today and your tests are completely normal, you will receive your results only by: MyChart Message (if you have MyChart) OR A paper copy in the mail If you have any lab test that is abnormal or we need to change your treatment, we will call you to review the results.   Testing/Procedures: Coronary CTA- they will call you to schedule this.    Follow-Up: At [facility], you and your health needs are our priority.  As part of our continuing mission to provide you with exceptional heart care, we have created designated Provider Care Teams.  These Care Teams include your primary Cardiologist (physician) and Advanced Practice Providers (APPs -  Physician Assistants and Nurse Practitioners) who all work together to provide you with the care you need, when you need it.  We recommend signing up for the patient portal called "MyChart".  Sign up information is provided on this After Visit Summary.  MyChart is used to connect with patients for Virtual Visits (Telemedicine).  Patients are able to view lab/test results, encounter notes, upcoming appointments, etc.  Non-urgent messages can be sent to your provider as well.   To learn more about what you can do with MyChart, go to [email or web address].    Your next appointment:   1 month(s)  Provider:   Cadence Fransico Michael, PA-C  Other Instructions   Your cardiac CT will be scheduled at one of the below locations:   [facility] [address] [place], [place] [address] [phone]  OR  [facility] [address] [place], [place] [address] [phone]  OR   [facility] [address] [place], [place] [address] [phone]  If scheduled at [facility], please arrive at the [facility] and Children's Entrance (Entrance C2) of [facility] 30 minutes prior to test start time. You can use the FREE valet parking offered at entrance C (encouraged to control the heart rate for the test)  Proceed to the [facility] Radiology Department (first floor) to check-in and test prep.  All radiology patients and guests should use entrance C2 at [facility], accessed from [facility], even though the hospital's physical address listed is [address].    If scheduled at [facility] or [facility], please arrive 15 mins early for check-in and test prep.   Please follow these instructions carefully (unless otherwise directed):  Hold all erectile dysfunction medications at least 3 days (72 hrs) prior to test. (Ie viagra, cialis, sildenafil, tadalafil, etc) We will administer nitroglycerin during this exam.   On the Night Before the Test: Be sure to Drink plenty of water. Do not consume any caffeinated/decaffeinated beverages or chocolate 12 hours prior to your test. Do not take any antihistamines 12 hours prior to your test.   On the Day of the Test: Drink plenty of water until 1 hour prior to the test. Do not eat any food 1 hour prior to test. You may take your regular medications prior to the test.  Take metoprolol (Lopressor) two hours prior to test. If you take Furosemide/Hydrochlorothiazide/Spironolactone, please HOLD on the morning of the test. FEMALES-  please wear underwire-free bra if available, avoid dresses & tight clothing      After the Test: Drink plenty of water. After receiving IV contrast, you may experience a mild flushed feeling. This is normal. On occasion, you may experience a mild rash up to 24 hours after the test. This is not dangerous. If this occurs, you can take  Benadryl 25 mg and increase your fluid intake. If you experience trouble breathing, this can be serious. If it is severe call 911 IMMEDIATELY. If it is mild, please call our office. If you take any of these medications: Glipizide/Metformin, Avandament, Glucavance, please do not take 48 hours after completing test unless otherwise instructed.  We will call to schedule your test 2-4 weeks out understanding that some insurance companies will need an authorization prior to the service being performed.   For non-scheduling related questions, please contact the cardiac imaging nurse navigator should you have any questions/concerns: Rockwell Alexandria, Cardiac Imaging Nurse Navigator Larey Brick, Cardiac Imaging Nurse Navigator [facility] Direct Office Dial: [phone]   For scheduling needs, including cancellations and rescheduling, please call [place], [phone].

## 2023-04-22 NOTE — Progress Notes (Signed)
Cardiology Office Note:    Date:  04/22/2023   ID:  Vanessa Higgins, DOB 02-27-1951, MRN [id number]  PCP:  Vanessa Grana, FNP  [facility] Cardiologist:  Vanessa Kendall, MD  [facility] Electrophysiologist:  None   Referring MD: Vanessa Grana, FNP   Chief Complaint: 76-month follow-up  History of Present Illness:    Vanessa Higgins is a 72 y.o. female with a hx of frequent PVCs, mitral regurgitation, aortic insufficiency, hypertension, hyperlipidemia, diabetes type 2, right MCA cerebral aneurysm, asthma, OSA on CPAP, and GERD who presents for follow-up.  Had a Lexiscan Myoview in 2017 showing no significant ischemia with an EF of 55%, overall low risk.  Echo showed EF 60 to 65%, normal systolic parameters.  Heart monitor showed sinus rhythm with an average heart rate of 82 bpm with 1 5 beat atrial run, frequent PVCs with 11% burden, 5 ventricular couplets, ventricular bigeminy and rare PACs.  She was subsequently transferred care to Dr. Okey Higgins.  Echo from 2021 showed EF 60 to 65%, grade 1 diastolic dysfunction, mild MR, mild to moderate AI.  Carotid artery ultrasound from August 2023 showed no hemodynamically significant stenosis in the bilateral ICAs.  She underwent MRI of the brain and MRA of the head neck in September 2023 which showed a 2 to 3 mm right MCA bifurcation aneurysm.  She has since then followed up with neurology with plans to obtain imaging every 12 months.  Patient was last seen in February 2024 for doing well from a cardiac perspective.  Echo was updated to evaluate valvular heart disease. Echo showed echo showed normal LVEF 60 to 65%, grade 1 diastolic dysfunction, mild MR, mild AI.  ER visit for chest pain on 03/24/2023. Troponin negative x 2.  She was given sublingual nitro and aspirin.  Labs overall looked good.  Chest x-ray was nonacute.  Today, patient repots squeezing chest pain that woke the patient up. It lasted for a couple hours. NTG improved the pain.  Mild SOB and felt nasuea. No further recurrent chest pain.  She denies lower leg edema, orthopnea, PND, lightheadedness or dizziness.  Past Medical History:  Diagnosis Date   Allergy    hay fever   Anemia    Ankle fracture, left 04/08/2014   Anxiety and depression    Arthritis    Asthma    well controlled    Blood transfusion abn reaction or complication, no procedure mishap    Blood transfusion without reported diagnosis    COVID-19 virus infection 08/2020   Depression    Diabetes mellitus    Disc displacement, lumbar    Dysrhythmia    pvc's   Fatty liver    GERD (gastroesophageal reflux disease)    Headache(784.0)    History of hiatal hernia    small   Hyperlipidemia    Hypertension    Joint pain    Low vitamin B12 level    Low vitamin D level    Migraine    Mitral valve prolapse    Obstructive sleep apnea    CPAP   Osteoarthritis    Palpitations    PONV (postoperative nausea and vomiting)    h/o in the past   Sleep apnea 04/06/2017   uses CPAP   SOB (shortness of breath)    UTI (lower urinary tract infection)    Vertigo    1 episode, several years ago   Vitamin B 12 deficiency    Vitamin D deficiency  Wears glasses     Past Surgical History:  Procedure Laterality Date   APPENDECTOMY  1977   CATARACT EXTRACTION W/PHACO Right 02/19/2023   Procedure: CATARACT EXTRACTION PHACO AND INTRAOCULAR LENS PLACEMENT (IOC) RIGHT DIABETIC  7.13  00:46.7;  Surgeon: Vanessa Mola, MD;  Location: [facility];  Service: Ophthalmology;  Laterality: Right;  Diabetic   CATARACT EXTRACTION W/PHACO Left 03/05/2023   Procedure: CATARACT EXTRACTION PHACO AND INTRAOCULAR LENS PLACEMENT (IOC) LEFT DIABETIC  6.03  00:36.4;  Surgeon: Vanessa Mola, MD;  Location: [facility];  Service: Ophthalmology;  Laterality: Left;  Diabetic   COLONOSCOPY  last 11/17/2014   INGUINAL HERNIA REPAIR Right 11/01/2020   Procedure: HERNIA REPAIR INGUINAL ADULT;  Surgeon:  Vanessa Mayotte, MD;  Location: [facility] OR[facility];  Service: General;  Laterality: Right;   JOINT REPLACEMENT     KNEE ARTHROPLASTY Right 03/03/2019   Procedure: COMPUTER ASSISTED TOTAL KNEE ARTHROPLASTY-RIGHT;  Surgeon: Vanessa Heinz, MD;  Location: [facility] OR[facility];  Service: Orthopedics;  Laterality: Right;   LUMBAR LAMINECTOMY/DECOMPRESSION MICRODISCECTOMY Right 02/06/2018   Procedure: Lumbar two Hemilaminectomy for Discectomy;  Surgeon: Vanessa Memos, MD;  Location: [facility] OR;  Service: Neurosurgery;  Laterality: Right;   ORIF ANKLE FRACTURE Left 04/08/2014   Procedure: OPEN REDUCTION INTERNAL FIXATION (ORIF) ANKLE FRACTURE LEFT ANKLE FRACTURE OPEN TREATMENT BIMALLEOLAR ANKLE INCLUDES INTERNAL FIXATION;  Surgeon: Vanessa Post, MD;  Location: [facility];  Service: Orthopedics;  Laterality: Left;   PAROTIDECTOMY  1980   left   POLYPECTOMY     TONSILLECTOMY AND ADENOIDECTOMY     as a child   TOTAL KNEE ARTHROPLASTY Bilateral    left   TUBAL LIGATION  05/1976    Current Medications: Current Meds  Medication Sig   albuterol (VENTOLIN HFA) 108 (90 Base) MCG/ACT inhaler TAKE 2 PUFFS BY MOUTH EVERY 6 HOURS AS NEEDED FOR WHEEZE OR SHORTNESS OF BREATH   atorvastatin (LIPITOR) 20 MG tablet TAKE 1 TABLET BY MOUTH EVERYDAY AT BEDTIME   Budeson-Glycopyrrol-Formoterol (BREZTRI AEROSPHERE) 160-9-4.8 MCG/ACT AERO Inhale 2 puffs into the lungs in the morning and at bedtime.   Cholecalciferol (VITAMIN D) 125 MCG (5000 UT) CAPS Take 5,000 Units by mouth daily.   dapagliflozin propanediol (FARXIGA) 10 MG TABS tablet Take 1 tablet (10 mg total) by mouth daily before breakfast.   diltiazem (CARDIZEM CD) 240 MG 24 hr capsule TAKE 1 CAPSULE BY MOUTH EVERY DAY   diphenoxylate-atropine (LOMOTIL) 2.5-0.025 MG tablet Take 1 tablet by mouth every 6 (six) hours as needed for diarrhea or loose stools.   insulin glargine (LANTUS SOLOSTAR) 100 UNIT/ML Solostar Pen Inject 6 Units into the skin every morning. (Patient  taking differently: Inject 12 Units into the skin every morning.)   Insulin Pen Needle 31G X 6 MM MISC Use one pen needle daily.   loratadine (CLARITIN) 10 MG tablet Take 10 mg by mouth at bedtime.    metFORMIN (GLUCOPHAGE-XR) 500 MG 24 hr tablet Take 2 tablets (1,000 mg total) by mouth 2 (two) times daily.   metoprolol tartrate (LOPRESSOR) 50 MG tablet Take 1 tablet by mouth once for procedure.   montelukast (SINGULAIR) 10 MG tablet TAKE 1 TABLET BY MOUTH EVERYDAY AT BEDTIME   omeprazole (PRILOSEC) 20 MG capsule Take 20 mg by mouth daily.   ondansetron (ZOFRAN) 4 MG tablet Take 1 tablet (4 mg total) by mouth every 8 (eight) hours as needed for nausea or vomiting.   ONETOUCH VERIO test strip USE 1 EACH BY OTHER ROUTE  AS NEEDED FOR OTHER. FREESTYLE ELITE   Probiotic Product (PROBIOTIC PO) Take 1 capsule by mouth daily.   telmisartan-hydrochlorothiazide (MICARDIS HCT) 80-25 MG tablet TAKE 1 TABLET BY MOUTH EVERY DAY   venlafaxine XR (EFFEXOR-XR) 150 MG 24 hr capsule TAKE 1 CAPSULE BY MOUTH DAILY WITH BREAKFAST.   vitamin B-12 (CYANOCOBALAMIN) 1000 MCG tablet Take 1,000 mcg by mouth at bedtime.     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 Husband deceased 05-17-08.      Work - [employer] endoscopy- [employer], [employer]   Diet - healthy   Exercise - gym 2 x per week   Social Determinants of  Health   Financial Resource Strain: Medium Risk (01/21/2022)   Overall Financial Resource Strain (CARDIA)    Difficulty of Paying Living Expenses: Somewhat hard  Food Insecurity: No Food Insecurity (01/07/2022)   Hunger Vital Sign    Worried About Running Out of Food in the Last Year: Never true    Ran Out of Food in the Last Year: Never true  Transportation Needs: No Transportation Needs (01/07/2022)   PRAPARE - Administrator, Civil Service (Medical): No    Lack of Transportation (Non-Medical): No  Physical Activity: Not on file  Stress: No Stress Concern Present (01/07/2022)   [employer] of Occupational Health - Occupational Stress Questionnaire    Feeling of Stress : Not at all  Social Connections: Unknown (01/07/2022)   Social Connection and Isolation Panel [NHANES]    Frequency of Communication with Friends and Family: More than three times a week    Frequency of Social Gatherings with Friends and Family: More than three times a week    Attends Religious Services: Not on Marketing executive or Organizations: Not on file    Attends Banker Meetings: Not on file    Marital Status: Not on file     Family History: The patient's family history includes Aneurysm in her paternal aunt; Cancer in her brother; Cancer (age of onset: 25) in her sister; Cancer (age of onset: 43) in her mother; Colon cancer (age of onset: 85) in her brother; Heart disease in her maternal grandfather; Heart disease (age of onset: 40) in her father; Stroke in her maternal grandmother; Sudden death in her father and mother. There is no history of Breast cancer, Colon polyps, Esophageal cancer, Rectal cancer, Stomach cancer, or Thyroid cancer.  ROS:   Please see the history of present illness.     All other systems reviewed and are negative.  EKGs/Labs/Other Studies Reviewed:    The following studies were reviewed today:  Echo 02/2023 1. Left ventricular ejection fraction,  by estimation, is 60 to 65%. The  left ventricle has normal function. The left ventricle  has no regional  wall motion abnormalities. Left ventricular diastolic parameters are  consistent with Grade I diastolic  dysfunction (impaired relaxation). The average left ventricular global  longitudinal strain is -16.4 %.   2. Right ventricular systolic function is normal. The right ventricular  size is normal. Tricuspid regurgitation signal is inadequate for assessing  PA pressure.   3. The mitral valve is normal in structure. Mild mitral valve  regurgitation. No evidence of mitral stenosis.   4. The aortic valve was not well visualized. Aortic valve regurgitation  is mild. Aortic valve sclerosis/calcification is present, without any  evidence of aortic stenosis.   5. The inferior vena cava is normal in size with greater than 50%  respiratory variability, suggesting right atrial pressure of 3 mmHg.   Comparison(s): 06/15/20 60-65%, m/m AI.   Echo 05/2020 1. Left ventricular ejection fraction, by estimation, is 60 to 65%. The  left ventricle has normal function. The left ventricle has no regional  wall motion abnormalities. Left ventricular diastolic parameters are  consistent with Grade I diastolic  dysfunction (impaired relaxation).   2. Right ventricular systolic function is normal. The right ventricular  size is normal.   3. The mitral valve is normal in structure. Mild mitral valve  regurgitation.   4. The aortic valve was not well visualized. Aortic valve regurgitation  is mild to moderate.   EKG:  EKG is ordered today.  The ekg ordered today demonstrates NSR, 82bpm, low voltage, q wave V1 (old)  Recent Labs: 10/28/2022: ALT 20; TSH 1.03 03/24/2023: BUN 25; Creatinine, Ser 1.09; Hemoglobin 13.5; Platelets 253; Potassium 4.4; Sodium 137  Recent Lipid Panel    Component Value Date/Time   CHOL 153 10/28/2022 0951   CHOL 105 07/12/2020 1451   CHOL 102 06/28/2013 0711   TRIG 200.0 (H)  10/28/2022 0951   TRIG 81 06/28/2013 0711   HDL 52.40 10/28/2022 0951   HDL 41 07/12/2020 1451   HDL 44 06/28/2013 0711   CHOLHDL 3 10/28/2022 0951   VLDL 40.0 10/28/2022 0951   VLDL 16 06/28/2013 0711   LDLCALC 61 10/28/2022 0951   LDLCALC 49 07/12/2020 1451   LDLCALC 42 06/28/2013 0711    Physical Exam:    VS:  BP 108/64 (BP Location: Left Arm, Patient Position: Sitting, Cuff Size: Large)   Pulse 82   Ht 5\' 3"  (1.6 m)   Wt 198 lb (89.8 kg)   SpO2 92%   BMI 35.07 kg/m     Wt Readings from Last 3 Encounters:  04/22/23 198 lb (89.8 kg)  03/25/23 194 lb 3.2 oz (88.1 kg)  03/24/23 192 lb (87.1 kg)     GEN:  Well nourished, well developed in no acute distress HEENT: Normal NECK: No JVD; No carotid bruits LYMPHATICS: No lymphadenopathy CARDIAC: RRR, no murmurs, rubs, gallops RESPIRATORY:  Clear to auscultation without rales, wheezing or rhonchi  ABDOMEN: Soft, non-tender, non-distended MUSCULOSKELETAL:  No edema; No deformity  SKIN: Warm and dry NEUROLOGIC:  Alert and oriented x 3 PSYCHIATRIC:  Normal affect   ASSESSMENT:    1. Chest pain of uncertain etiology   2. PVC's (premature ventricular contractions)   3. Mitral valve insufficiency, unspecified etiology   4. Aortic valve insufficiency, etiology of cardiac valve disease unspecified   5. Essential hypertension   6. Hyperlipidemia, mixed    PLAN:    In order of problems listed above:  Chest pain Patient had a recent ER visit for chest pain with negative workup.  Patient describes fairly typical chest pain.  EKG with no significant ischemic changes.  I will order cardiac CTA.  Recent echo showed normal LVEF, grade 1 diastolic dysfunction.  Frequent PVCs Prior stress testing has been nonischemic.  Recent echo with normal LVEF continue Cardizem to 240 mg daily.  Valvular heart disease Recent echo showed mild MR and mild AI.  HTN Blood pressure is good today, continue current medications.  HLD LDL 61 in  October 2023.  Continue atorvastatin 20 mg daily.  Disposition: Follow up in 1 month(s) with MD/APP     Signed, Marjie Chea David Stall, PA-C  04/22/2023 2:11 PM    [facility]

## 2023-05-01 ENCOUNTER — Other Ambulatory Visit
Admission: RE | Admit: 2023-05-01 | Discharge: 2023-05-01 | Disposition: A | Discharge status: Home / Self Care | Payer: PPO | Source: Ambulatory Visit | Attending: Medical | Admitting: Medical

## 2023-05-01 DIAGNOSIS — R079 Chest pain, unspecified: Secondary | ICD-10-CM

## 2023-05-01 LAB — BASIC METABOLIC PANEL
Anion gap: 11 (ref 5–15)
BUN: 34 mg/dL — ABNORMAL HIGH (ref 8–23)
CO2: 28 mmol/L (ref 22–32)
Calcium: 9.2 mg/dL (ref 8.9–10.3)
Chloride: 97 mmol/L — ABNORMAL LOW (ref 98–111)
Creatinine, Ser: 0.87 mg/dL (ref 0.44–1.00)
GFR, Estimated: >60 mL/min (ref >60)
Glucose, Bld: 177 mg/dL — ABNORMAL HIGH (ref 70–99)
Potassium: 3.6 mmol/L (ref 3.5–5.1)
Sodium: 136 mmol/L (ref 135–145)

## 2023-05-14 ENCOUNTER — Telehealth (HOSPITAL_COMMUNITY): Payer: Self-pay | Admitting: Emergency Medicine

## 2023-05-14 NOTE — Telephone Encounter (Signed)
Attempted to call patient regarding upcoming cardiac CT appointment. °Left message on voicemail with name and callback number °Franco Duley RN Navigator Cardiac Imaging °[facility] °[phone] Office °[phone] Cell ° °

## 2023-05-15 ENCOUNTER — Ambulatory Visit
Admission: RE | Admit: 2023-05-15 | Discharge: 2023-05-15 | Disposition: A | Discharge status: Home / Self Care | Payer: PPO | Source: Ambulatory Visit | Attending: Medical | Admitting: Medical

## 2023-05-15 DIAGNOSIS — R072 Precordial pain: Secondary | ICD-10-CM | POA: Diagnosis not present

## 2023-05-15 DIAGNOSIS — I2510 Atherosclerotic heart disease of native coronary artery without angina pectoris: Secondary | ICD-10-CM | POA: Insufficient documentation

## 2023-05-15 DIAGNOSIS — R079 Chest pain, unspecified: Secondary | ICD-10-CM | POA: Insufficient documentation

## 2023-05-15 MED ORDER — METOPROLOL TARTRATE 5 MG/5ML IV SOLN: 5.0000 mg | Freq: Once | INTRAVENOUS | Status: DC

## 2023-05-15 MED ORDER — METOPROLOL TARTRATE 5 MG/5ML IV SOLN: 10.0000 mg | Freq: Once | INTRAVENOUS | Status: DC

## 2023-05-15 MED ORDER — NITROGLYCERIN 0.4 MG SL SUBL
0.8000 mg | SUBLINGUAL_TABLET | Freq: Once | SUBLINGUAL | Status: AC
  Administered 2023-05-15: 0.8 mg via SUBLINGUAL

## 2023-05-15 MED ORDER — IOHEXOL 350 MG/ML SOLN
100.0000 mL | Freq: Once | INTRAVENOUS | Status: AC | PRN
  Administered 2023-05-15: 100 mL via INTRAVENOUS

## 2023-05-15 NOTE — Progress Notes (Signed)
Patient tolerated procedure well. Ambulate w/o difficulty. Denies light headedness or being dizzy. Sitting in chair drinking water provided. Encouraged to drink extra water today and reasoning explained. Verbalized understanding. All questions answered. ABC intact. No further needs. Discharge from procedure area w/o issues.   °

## 2023-05-16 ENCOUNTER — Other Ambulatory Visit: Payer: Self-pay

## 2023-05-16 MED ORDER — ASPIRIN 81 MG PO TBEC: 81.0000 mg | DELAYED_RELEASE_TABLET | Freq: Every day | ORAL | 3 refills | Status: AC

## 2023-06-03 ENCOUNTER — Other Ambulatory Visit: Payer: Self-pay | Admitting: Gastroenterology

## 2023-06-03 DIAGNOSIS — Z0289 Encounter for other administrative examinations: Secondary | ICD-10-CM

## 2023-06-03 DIAGNOSIS — K219 Gastro-esophageal reflux disease without esophagitis: Secondary | ICD-10-CM

## 2023-06-07 ENCOUNTER — Other Ambulatory Visit: Payer: Self-pay | Admitting: Family

## 2023-06-07 DIAGNOSIS — I10 Essential (primary) hypertension: Secondary | ICD-10-CM

## 2023-06-09 ENCOUNTER — Ambulatory Visit: Payer: PPO | Attending: Medical | Admitting: Medical

## 2023-06-09 ENCOUNTER — Encounter: Payer: Self-pay | Admitting: Medical

## 2023-06-09 VITALS — BP 115/69 | HR 75 | Ht 63.0 in | Wt 202.6 lb

## 2023-06-09 DIAGNOSIS — I2510 Atherosclerotic heart disease of native coronary artery without angina pectoris: Secondary | ICD-10-CM | POA: Diagnosis not present

## 2023-06-09 DIAGNOSIS — E782 Mixed hyperlipidemia: Secondary | ICD-10-CM | POA: Diagnosis not present

## 2023-06-09 DIAGNOSIS — I340 Nonrheumatic mitral (valve) insufficiency: Secondary | ICD-10-CM | POA: Diagnosis not present

## 2023-06-09 DIAGNOSIS — I10 Essential (primary) hypertension: Secondary | ICD-10-CM

## 2023-06-09 DIAGNOSIS — I351 Nonrheumatic aortic (valve) insufficiency: Secondary | ICD-10-CM

## 2023-06-09 DIAGNOSIS — I493 Ventricular premature depolarization: Secondary | ICD-10-CM

## 2023-06-09 NOTE — Patient Instructions (Signed)
Medication Instructions:  Continue taking medications as prescribed. *If you need a refill on your cardiac medications before your next appointment, please call your pharmacy*   Lab Work: none If you have labs (blood work) drawn today and your tests are completely normal, you will receive your results only by: MyChart Message (if you have MyChart) OR A paper copy in the mail If you have any lab test that is abnormal or we need to change your treatment, we will call you to review the results.   Testing/Procedures: none   Follow-Up: At [facility], you and your health needs are our priority.  As part of our continuing mission to provide you with exceptional heart care, we have created designated Provider Care Teams.  These Care Teams include your primary Cardiologist (physician) and Advanced Practice Providers (APPs -  Physician Assistants and Nurse Practitioners) who all work together to provide you with the care you need, when you need it.  We recommend signing up for the patient portal called "MyChart".  Sign up information is provided on this After Visit Summary.  MyChart is used to connect with patients for Virtual Visits (Telemedicine).  Patients are able to view lab/test results, encounter notes, upcoming appointments, etc.  Non-urgent messages can be sent to your provider as well.   To learn more about what you can do with MyChart, go to [email or web address].    Your next appointment:   1 year(s)  Provider:   You may see Yvonne Kendall, MD or one of the following Advanced Practice Providers on your designated Care Team:   Nicolasa Ducking, NP Eula Listen, PA-C Cadence Fransico Michael, PA-C Charlsie Quest, NP

## 2023-06-09 NOTE — Progress Notes (Signed)
Cardiology Office Note:    Date:  06/09/2023   ID:  Vanessa Higgins, DOB [birth year], MRN [id number]  PCP:  Allegra Grana, FNP  [facility] Cardiologist:  Yvonne Kendall, MD  [facility] Electrophysiologist:  None   Referring MD: Allegra Grana, FNP   Chief Complaint: 2 month follow-up  History of Present Illness:    Vanessa Higgins is a 72 y.o. female with a hx of frequent PVCs, mitral regurgitation, aortic insufficiency, hypertension, hyperlipidemia, diabetes type 2, right MCA cerebral aneurysm, asthma, OSA on CPAP, and GERD who presents for 2 month follow-up.   Lexiscan Myoview in 2017 showing no significant ischemia with an EF of 55%, overall low risk.  Echo showed EF 60 to 65%, normal systolic parameters.  Heart monitor showed sinus rhythm with an average heart rate of 82 bpm with 1 5 beat atrial run, frequent PVCs with 11% burden, 5 ventricular couplets, ventricular bigeminy and rare PACs.  She was subsequently transferred care to Dr. Okey Dupre.  Echo from 2021 showed EF 60 to 65%, grade 1 diastolic dysfunction, mild MR, mild to moderate AI.   Carotid artery ultrasound from August 2023 showed no hemodynamically significant stenosis in the bilateral ICAs.  She underwent MRI of the brain and MRA of the head neck in September 2023 which showed a 2 to 3 mm right MCA bifurcation aneurysm.  She has since then followed up with neurology with plans to obtain imaging every 12 months.   Patient was seen in February 2024 for doing well from a cardiac perspective.  Echo was updated to evaluate valvular heart disease. Echo showed echo showed normal LVEF 60 to 65%, grade 1 diastolic dysfunction, mild MR, mild AI.   ER visit for chest pain on 03/24/2023. Troponin negative x 2.  She was given sublingual nitro and aspirin.  Labs overall looked good.  Chest x-ray was nonacute.  The patient was last seen 04/22/23 and reported squeezing chest pain improved with NTG. Cardiac CTA was ordered. This  showed mild nonobstructive CAD 25-49%.   Today, Cardiac CTA was reviewed. The patient reports no further chest pain. No shortness of breath, lower leg edema, orthopnea, pnd, palpitations.  She is taking an ASA. She is using her CPAP.   Past Medical History:  Diagnosis Date   Allergy    hay fever   Anemia    Ankle fracture, left 04/08/2014   Anxiety and depression    Arthritis    Asthma    well controlled    Blood transfusion abn reaction or complication, no procedure mishap    Blood transfusion without reported diagnosis    COVID-19 virus infection 08/2020   Depression    Diabetes mellitus    Disc displacement, lumbar    Dysrhythmia    pvc's   Fatty liver    GERD (gastroesophageal reflux disease)    Headache(784.0)    History of hiatal hernia    small   Hyperlipidemia    Hypertension    Joint pain    Low vitamin B12 level    Low vitamin D level    Migraine    Mitral valve prolapse    Obstructive sleep apnea    CPAP   Osteoarthritis    Palpitations    PONV (postoperative nausea and vomiting)    h/o in the past   Sleep apnea 04/06/2017   uses CPAP   SOB (shortness of breath)    UTI (lower urinary tract infection)    Vertigo  1 episode, several years ago   Vitamin B 12 deficiency    Vitamin D deficiency    Wears glasses     Past Surgical History:  Procedure Laterality Date   APPENDECTOMY  1977   CATARACT EXTRACTION W/PHACO Right 02/19/2023   Procedure: CATARACT EXTRACTION PHACO AND INTRAOCULAR LENS PLACEMENT (IOC) RIGHT DIABETIC  7.13  00:46.7;  Surgeon: Lockie Mola, MD;  Location: [facility];  Service: Ophthalmology;  Laterality: Right;  Diabetic   CATARACT EXTRACTION W/PHACO Left 03/05/2023   Procedure: CATARACT EXTRACTION PHACO AND INTRAOCULAR LENS PLACEMENT (IOC) LEFT DIABETIC  6.03  00:36.4;  Surgeon: Lockie Mola, MD;  Location: [facility];  Service: Ophthalmology;  Laterality: Left;  Diabetic   COLONOSCOPY  last  11/17/2014   INGUINAL HERNIA REPAIR Right 11/01/2020   Procedure: HERNIA REPAIR INGUINAL ADULT;  Surgeon: Earline Mayotte, MD;  Location: [facility];  Service: General;  Laterality: Right;   JOINT REPLACEMENT     KNEE ARTHROPLASTY Right 03/03/2019   Procedure: COMPUTER ASSISTED TOTAL KNEE ARTHROPLASTY-RIGHT;  Surgeon: Donato Heinz, MD;  Location: [facility];  Service: Orthopedics;  Laterality: Right;   LUMBAR LAMINECTOMY/DECOMPRESSION MICRODISCECTOMY Right 02/06/2018   Procedure: Lumbar two Hemilaminectomy for Discectomy;  Surgeon: Coletta Memos, MD;  Location: [facility] OR;  Service: Neurosurgery;  Laterality: Right;   ORIF ANKLE FRACTURE Left 04/08/2014   Procedure: OPEN REDUCTION INTERNAL FIXATION (ORIF) ANKLE FRACTURE LEFT ANKLE FRACTURE OPEN TREATMENT BIMALLEOLAR ANKLE INCLUDES INTERNAL FIXATION;  Surgeon: Eulas Post, MD;  Location: [facility];  Service: Orthopedics;  Laterality: Left;   PAROTIDECTOMY  1980   left   POLYPECTOMY     TONSILLECTOMY AND ADENOIDECTOMY     as a child   TOTAL KNEE ARTHROPLASTY Bilateral    left   TUBAL LIGATION  05/1976    Current Medications: Current Meds  Medication Sig   albuterol (VENTOLIN HFA) 108 (90 Base) MCG/ACT inhaler TAKE 2 PUFFS BY MOUTH EVERY 6 HOURS AS NEEDED FOR WHEEZE OR SHORTNESS OF BREATH   aspirin EC 81 MG tablet Take 1 tablet (81 mg total) by mouth daily. Swallow whole.   atorvastatin (LIPITOR) 20 MG tablet TAKE 1 TABLET BY MOUTH EVERYDAY AT BEDTIME   Budeson-Glycopyrrol-Formoterol (BREZTRI AEROSPHERE) 160-9-4.8 MCG/ACT AERO Inhale 2 puffs into the lungs in the morning and at bedtime.   Cholecalciferol (VITAMIN D) 125 MCG (5000 UT) CAPS Take 5,000 Units by mouth daily.   dapagliflozin propanediol (FARXIGA) 10 MG TABS tablet Take 1 tablet (10 mg total) by mouth daily before breakfast.   diltiazem (CARDIZEM CD) 240 MG 24 hr capsule TAKE 1 CAPSULE BY MOUTH EVERY DAY   diphenoxylate-atropine (LOMOTIL) 2.5-0.025 MG tablet Take 1  tablet by mouth every 6 (six) hours as needed for diarrhea or loose stools.   insulin glargine (LANTUS SOLOSTAR) 100 UNIT/ML Solostar Pen Inject 6 Units into the skin every morning. (Patient taking differently: Inject 12 Units into the skin every morning.)   Insulin Pen Needle 31G X 6 MM MISC Use one pen needle daily.   loratadine (CLARITIN) 10 MG tablet Take 10 mg by mouth at bedtime.    metFORMIN (GLUCOPHAGE-XR) 500 MG 24 hr tablet Take 2 tablets (1,000 mg total) by mouth 2 (two) times daily.   montelukast (SINGULAIR) 10 MG tablet TAKE 1 TABLET BY MOUTH EVERYDAY AT BEDTIME   omeprazole (PRILOSEC) 20 MG capsule Take 20 mg by mouth daily.   ondansetron (ZOFRAN) 4 MG tablet Take 1 tablet (4 mg total) by mouth  every 8 (eight) hours as needed for nausea or vomiting.   ONETOUCH VERIO test strip USE 1 EACH BY OTHER ROUTE AS NEEDED FOR OTHER. FREESTYLE ELITE   Probiotic Product (PROBIOTIC PO) Take 1 capsule by mouth daily.   telmisartan-hydrochlorothiazide (MICARDIS HCT) 80-25 MG tablet TAKE 1 TABLET BY MOUTH EVERY DAY   venlafaxine XR (EFFEXOR-XR) 150 MG 24 hr capsule TAKE 1 CAPSULE BY MOUTH DAILY WITH BREAKFAST.   vitamin B-12 (CYANOCOBALAMIN) 1000 MCG tablet Take 1,000 mcg by mouth at bedtime.     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 Husband deceased 07-07-08.      Work - [employer]  endoscopy- [employer], [employer]   Diet - healthy   Exercise - gym 2 x per week   Social Determinants of Health   Financial Resource Strain: Medium Risk (01/21/2022)   Overall Financial Resource Strain (CARDIA)    Difficulty of Paying Living Expenses: Somewhat hard  Food Insecurity: No Food Insecurity (01/07/2022)   Hunger Vital Sign    Worried About Running Out of Food in the Last Year: Never true    Ran Out of Food in the Last Year: Never true  Transportation Needs: No Transportation Needs (01/07/2022)   PRAPARE - Administrator, Civil Service (Medical): No    Lack of Transportation (Non-Medical): No  Physical Activity: Not on file  Stress: No Stress Concern Present (01/07/2022)   [employer] of Occupational Health - Occupational Stress Questionnaire    Feeling of Stress : Not at all  Social Connections: Unknown (01/07/2022)   Social Connection and Isolation Panel [NHANES]    Frequency of Communication with Friends and Family: More than three times a week    Frequency of Social Gatherings with Friends and Family: More than three times a week    Attends Religious Services: Not on Marketing executive or Organizations: Not on file    Attends Banker Meetings: Not on file    Marital Status: Not on file     Family History: The patient's family history includes Aneurysm in her paternal aunt; Cancer in her brother; Cancer (age of onset: 12) in her sister; Cancer (age of onset: 76) in her mother; Colon cancer (age of onset: 22) in her brother; Heart disease in her maternal grandfather; Heart disease (age of onset: 72) in her father; Stroke in her maternal grandmother; Sudden death in her father and mother. There is no history of Breast cancer, Colon polyps, Esophageal cancer, Rectal cancer, Stomach cancer, or Thyroid cancer.  ROS:   Please see the history of present illness.     All other systems reviewed and are negative.  EKGs/Labs/Other Studies Reviewed:     The following studies were reviewed today:  Cardiac  CTA 04/2023 IMPRESSION: 1. Coronary calcium score of 615. This was 91st percentile for age and sex matched control.   2. Normal coronary origin with right dominance.   3. Mild proximal LAD stenosis (25-49%).   4. Minimal proximal LCx stenosis (<25%).   5. CAD-RADS 2. Mild non-obstructive CAD (25-49%). Consider non-atherosclerotic causes of chest pain. Consider preventive therapy and risk factor modification.    Echo 02/2023 1. Left ventricular ejection fraction, by estimation, is 60 to 65%. The  left ventricle has normal function. The left ventricle has no regional  wall motion abnormalities. Left ventricular diastolic parameters are  consistent with Grade I diastolic  dysfunction (impaired relaxation). The average left ventricular global  longitudinal strain is -16.4 %.   2. Right ventricular systolic function is normal. The right ventricular  size is normal. Tricuspid regurgitation signal is inadequate for assessing  PA pressure.   3. The mitral valve is normal in structure. Mild mitral valve  regurgitation. No evidence of mitral stenosis.   4. The aortic valve was not well visualized. Aortic valve regurgitation  is mild. Aortic valve sclerosis/calcification is present, without any  evidence of aortic stenosis.   5. The inferior vena cava is normal in size with greater than 50%  respiratory variability, suggesting right atrial pressure of 3 mmHg.   Comparison(s): 06/15/20 60-65%, m/m AI.    EKG:  EKG is not ordered today.    Recent Labs: 10/28/2022: ALT 20; TSH 1.03 03/24/2023: Hemoglobin 13.5; Platelets 253 05/01/2023: BUN 34; Creatinine, Ser 0.87; Potassium 3.6; Sodium 136  Recent Lipid Panel    Component Value Date/Time   CHOL 153 10/28/2022 0951   CHOL 105 07/12/2020 1451   CHOL 102 06/28/2013 0711   TRIG 200.0 (H) 10/28/2022 0951   TRIG 81 06/28/2013 0711   HDL 52.40 10/28/2022 0951   HDL 41 07/12/2020 1451    HDL 44 06/28/2013 0711   CHOLHDL 3 10/28/2022 0951   VLDL 40.0 10/28/2022 0951   VLDL 16 06/28/2013 0711   LDLCALC 61 10/28/2022 0951   LDLCALC 49 07/12/2020 1451   LDLCALC 42 06/28/2013 0711     Physical Exam:    VS:  BP 115/69 (BP Location: Left Arm, Patient Position: Sitting, Cuff Size: Large)   Pulse 75   Ht 5\' 3"  (1.6 m)   Wt 202 lb 9.6 oz (91.9 kg)   SpO2 94%   BMI 35.89 kg/m     Wt Readings from Last 3 Encounters:  06/09/23 202 lb 9.6 oz (91.9 kg)  04/22/23 198 lb (89.8 kg)  03/25/23 194 lb 3.2 oz (88.1 kg)     GEN:  Well nourished, well developed in no acute distress HEENT: Normal NECK: No JVD; No carotid bruits LYMPHATICS: No lymphadenopathy CARDIAC: RRR, no murmurs, rubs, gallops RESPIRATORY:  Clear to auscultation without rales, wheezing or rhonchi  ABDOMEN: Soft, non-tender, non-distended MUSCULOSKELETAL:  No edema; No deformity  SKIN: Warm and dry NEUROLOGIC:  Alert and oriented x 3 PSYCHIATRIC:  Normal affect   ASSESSMENT:    1. Coronary artery disease involving native coronary artery of native heart without angina pectoris   2. Frequent PVCs   3. PVC's (premature ventricular contractions)   4. Aortic valve insufficiency, etiology of cardiac valve disease unspecified   5. Mitral valve insufficiency, unspecified etiology   6. Essential hypertension   7. Hyperlipidemia, mixed    PLAN:    In order of problems listed above:  Chest pain Cardiac CTA showed mild nonobstructive CAD. She was  started on Aspirin 81mg  daily. No further chest pain reported.  No further ischemic workup indicated at this time.  Frequent PVCs Patient denies any palpitations. Prior stress testing was nonischemic. Continue Cardizem to 240 mg daily.  Valvular  heart disease Most recent echo showed mild MR mild AI.  HTN Blood pressure is good today, continue current medications.  HLD PCP will recheck lipid panel this year.  Most recent LDL 6.  Continue Lipitor 20 mg  daily.  Disposition: Follow up in 1 year(s) with MD/APP    Signed, Paislea Hatton David Stall, PA-C  06/09/2023 3:22 PM    [facility]

## 2023-06-16 ENCOUNTER — Encounter: Payer: Self-pay | Admitting: Family

## 2023-06-16 ENCOUNTER — Ambulatory Visit (INDEPENDENT_AMBULATORY_CARE_PROVIDER_SITE_OTHER): Payer: PPO | Admitting: Family

## 2023-06-16 VITALS — BP 118/70 | HR 75 | Temp 97.7°F | Ht 63.0 in | Wt 205.2 lb

## 2023-06-16 DIAGNOSIS — I10 Essential (primary) hypertension: Secondary | ICD-10-CM

## 2023-06-16 DIAGNOSIS — I2510 Atherosclerotic heart disease of native coronary artery without angina pectoris: Secondary | ICD-10-CM | POA: Insufficient documentation

## 2023-06-16 DIAGNOSIS — E1169 Type 2 diabetes mellitus with other specified complication: Secondary | ICD-10-CM

## 2023-06-16 DIAGNOSIS — Z794 Long term (current) use of insulin: Secondary | ICD-10-CM | POA: Diagnosis not present

## 2023-06-16 DIAGNOSIS — K219 Gastro-esophageal reflux disease without esophagitis: Secondary | ICD-10-CM

## 2023-06-16 DIAGNOSIS — Z1231 Encounter for screening mammogram for malignant neoplasm of breast: Secondary | ICD-10-CM

## 2023-06-16 DIAGNOSIS — I2583 Coronary atherosclerosis due to lipid rich plaque: Secondary | ICD-10-CM

## 2023-06-16 LAB — POCT GLYCOSYLATED HEMOGLOBIN (HGB A1C): Hemoglobin A1C: 9 % — AB (ref 4.0–5.6)

## 2023-06-16 MED ORDER — TRULICITY 0.75 MG/0.5ML ~~LOC~~ SOAJ
0.7500 mg | SUBCUTANEOUS | 3 refills | Status: DC
Start: 2023-06-16 — End: 2023-06-18

## 2023-06-16 MED ORDER — CONTINUOUS GLUCOSE MONITOR SUP KIT
1.0000 [IU] | PACK | Freq: Every day | 0 refills | Status: DC
Start: 2023-06-16 — End: 2024-04-22

## 2023-06-16 MED ORDER — PANTOPRAZOLE SODIUM 20 MG PO TBEC
20.0000 mg | DELAYED_RELEASE_TABLET | ORAL | 3 refills | Status: DC
Start: 2023-06-16 — End: 2024-06-07

## 2023-06-16 NOTE — Progress Notes (Signed)
Assessment & Plan:  Type 2 diabetes mellitus with other specified complication, with long-term current use of insulin [facility]) Assessment & Plan: Lab Results  Component Value Date   HGBA1C 9.0 (A) 06/16/2023   Uncontrolled.  Patient agreeable to retrial of Trulicity as she states Ozempic caused nausea, not Trulicity.  Start Trulicity 0.75 mg.  Continue Farxiga 10 mg daily, Lantus 14 units, metformin 1000 mg twice daily.  CGM prescribed.  Patient will notify me weekly of fasting and postprandial blood sugar so that I may decrease Lantus if needed.  Orders: -     POCT glycosylated hemoglobin (Hb A1C) -     Trulicity; Inject 0.75 mg into the skin once a week.  Dispense: 2 mL; Refill: 3 -     Continuous Glucose Monitor Sup; 1 Units by Does not apply route daily.  Dispense: 1 kit; Refill: 0  Encounter for screening mammogram for malignant neoplasm of breast -     3D Screening Mammogram, Left and Right; Future  Essential hypertension Assessment & Plan: Chronic, stable.  Continue diltiazem 240 mg, telmisartan-hydrochlorothiazide 80-25 mg   Coronary artery disease due to lipid rich plaque Assessment & Plan: LDL 61.  Compliant with aspirin 81 mg daily.  She will follow annually with cardiology   Gastroesophageal reflux disease without esophagitis Assessment & Plan: No alarm features at this time.  Continue Protonix 20 mg qd  Orders: -     Pantoprazole Sodium; Take 1 tablet (20 mg total) by mouth every morning. Take 30 minutes to hour before breakfast  Dispense: 90 tablet; Refill: 3     Return precautions given.   Risks, benefits, and alternatives of the medications and treatment plan prescribed today were discussed, and patient expressed understanding.   Education regarding symptom management and diagnosis given to patient on AVS either electronically or printed.  Return in about 3 months (around 09/16/2023) for Medicare Wellness upcoming or due, schedule.  Rennie Plowman,  FNP  Subjective:    Patient ID: Vanessa Higgins, female    DOB: 06-07-1951, 72 y.o.   MRN: [id number]  CC: Vanessa Higgins is a 72 y.o. female who presents today for follow up.   HPI: Feels well today.  No new complaints  FBG 170-200  Postprandial 220's  Compliant with Farxiga 10 mg, metformin 1000 mg twice daily, lantus 14 units  She request a refill of Protonix 20 mg daily.  She stays on this medication at daily due to history of gastritis.  Denies pain with swallowing, choking, unusual weight loss  Previously she has been on Trulicity and tolerated.  Ozempic caused nausea, vomiting   No recurrence of chest pain .  compliant with ASA 81mg . Denies bleeding  Follow-up cardiology 06/09/2023 for CP, HTN, HLD.   Allergies: Sulfa antibiotics, Lisinopril, Other, Ozempic (0.25 or 0.5 mg-dose) [semaglutide(0.25 or 0.5mg -dos)], and Ramipril Current Outpatient Medications on File Prior to Visit  Medication Sig Dispense Refill   albuterol (VENTOLIN HFA) 108 (90 Base) MCG/ACT inhaler TAKE 2 PUFFS BY MOUTH EVERY 6 HOURS AS NEEDED FOR WHEEZE OR SHORTNESS OF BREATH 18 each 2   aspirin EC 81 MG tablet Take 1 tablet (81 mg total) by mouth daily. Swallow whole. 90 tablet 3   atorvastatin (LIPITOR) 20 MG tablet TAKE 1 TABLET BY MOUTH EVERYDAY AT BEDTIME 90 tablet 1   Budeson-Glycopyrrol-Formoterol (BREZTRI AEROSPHERE) 160-9-4.8 MCG/ACT AERO Inhale 2 puffs into the lungs in the morning and at bedtime. 32.1 g 3   Cholecalciferol (VITAMIN D) 125  MCG (5000 UT) CAPS Take 5,000 Units by mouth daily.     dapagliflozin propanediol (FARXIGA) 10 MG TABS tablet Take 1 tablet (10 mg total) by mouth daily before breakfast. 90 tablet 3   diltiazem (CARDIZEM CD) 240 MG 24 hr capsule TAKE 1 CAPSULE BY MOUTH EVERY DAY 90 capsule 2   diphenoxylate-atropine (LOMOTIL) 2.5-0.025 MG tablet Take 1 tablet by mouth every 6 (six) hours as needed for diarrhea or loose stools. 30 tablet 1   insulin glargine (LANTUS SOLOSTAR) 100  UNIT/ML Solostar Pen Inject 6 Units into the skin every morning. (Patient taking differently: Inject 12 Units into the skin every morning.) 15 mL 1   Insulin Pen Needle 31G X 6 MM MISC Use one pen needle daily. 90 each 3   loratadine (CLARITIN) 10 MG tablet Take 10 mg by mouth at bedtime.      metFORMIN (GLUCOPHAGE-XR) 500 MG 24 hr tablet Take 2 tablets (1,000 mg total) by mouth 2 (two) times daily. 360 tablet 1   montelukast (SINGULAIR) 10 MG tablet TAKE 1 TABLET BY MOUTH EVERYDAY AT BEDTIME 90 tablet 1   ondansetron (ZOFRAN) 4 MG tablet Take 1 tablet (4 mg total) by mouth every 8 (eight) hours as needed for nausea or vomiting. 20 tablet 0   ONETOUCH VERIO test strip USE 1 EACH BY OTHER ROUTE AS NEEDED FOR OTHER. FREESTYLE ELITE 100 strip 3   Probiotic Product (PROBIOTIC PO) Take 1 capsule by mouth daily.     telmisartan-hydrochlorothiazide (MICARDIS HCT) 80-25 MG tablet TAKE 1 TABLET BY MOUTH EVERY DAY 90 tablet 1   venlafaxine XR (EFFEXOR-XR) 150 MG 24 hr capsule TAKE 1 CAPSULE BY MOUTH DAILY WITH BREAKFAST. 90 capsule 1   vitamin B-12 (CYANOCOBALAMIN) 1000 MCG tablet Take 1,000 mcg by mouth at bedtime.     No current facility-administered medications on file prior to visit.    Review of Systems  Constitutional:  Negative for chills and fever.  Respiratory:  Negative for cough.   Cardiovascular:  Negative for chest pain and palpitations.  Gastrointestinal:  Negative for nausea and vomiting.      Objective:    BP 118/70   Pulse 75   Temp 97.7 F (36.5 C) (Oral)   Ht 5\' 3"  (1.6 m)   Wt 205 lb 3.2 oz (93.1 kg)   SpO2 94%   BMI 36.35 kg/m  BP Readings from Last 3 Encounters:  06/16/23 118/70  06/09/23 115/69  05/15/23 111/64   Wt Readings from Last 3 Encounters:  06/16/23 205 lb 3.2 oz (93.1 kg)  06/09/23 202 lb 9.6 oz (91.9 kg)  04/22/23 198 lb (89.8 kg)    Physical Exam Vitals reviewed.  Constitutional:      Appearance: She is well-developed.  Eyes:      Conjunctiva/sclera: Conjunctivae normal.  Cardiovascular:     Rate and Rhythm: Normal rate and regular rhythm.     Pulses: Normal pulses.     Heart sounds: Normal heart sounds.  Pulmonary:     Effort: Pulmonary effort is normal.     Breath sounds: Normal breath sounds. No wheezing, rhonchi or rales.  Skin:    General: Skin is warm and dry.  Neurological:     Mental Status: She is alert.  Psychiatric:        Speech: Speech normal.        Behavior: Behavior normal.        Thought Content: Thought content normal.

## 2023-06-16 NOTE — Assessment & Plan Note (Signed)
Chronic, stable.  Continue diltiazem 240 mg, telmisartan-hydrochlorothiazide 80-25 mg 

## 2023-06-16 NOTE — Assessment & Plan Note (Signed)
Lab Results  Component Value Date   HGBA1C 9.0 (A) 06/16/2023   Uncontrolled.  Patient agreeable to retrial of Trulicity as she states Ozempic caused nausea, not Trulicity.  Start Trulicity 0.75 mg.  Continue Farxiga 10 mg daily, Lantus 14 units, metformin 1000 mg twice daily.  CGM prescribed.  Patient will notify me weekly of fasting and postprandial blood sugar so that I may decrease Lantus if needed.

## 2023-06-16 NOTE — Assessment & Plan Note (Signed)
No alarm features at this time.  Continue Protonix 20 mg qd

## 2023-06-16 NOTE — Patient Instructions (Addendum)
We have prescribed a continuous glucose monitor kit.  I would like to see fasting blood glucose and postprandial after the largest meal.  We are starting Trulicity at a low dose and we may need to decrease Lantus to prevent low blood sugar.  Please drop off weekly blood sugars.  If fasting blood sugar start to approach 120, I would like to know so I can decrease Lantus.   Start Trulicity. This is a ONCE per week injectable which increases insulin secretion and delays gastric emptying (helping you feel full longer which is a good thing).  You do NOT have to take with food.   Common reaction are GI upset ( nausea) however this tends to get better with time.  As discussed, this Medicare patient carries a black box warning may not take this medication with a personal or family history of medullary thyroid cancer, multiple endocrine  neoplasia ( MEN).   In 4 weeks time we will increase Trulicity to 1.5 mg once a week.  Dulaglutide Injection What is this medication? DULAGLUTIDE (DOO la GLOO tide) controls blood sugar in people with type 2 diabetes. It is used with lifestyle changes like diet and exercise. It may lower the risk of problems that need treatment in the hospital. These problemsinclude heart attack or stroke. This medicine may be used for other purposes; ask your health care provider orpharmacist if you have questions. COMMON BRAND NAME(S): Trulicity What should I tell my care team before I take this medication? They need to know if you have any of these conditions: endocrine tumors (MEN 2) or if someone in your family had these tumors eye disease, vision problems history of pancreatitis kidney disease liver disease stomach or intestine problems thyroid cancer or if someone in your family had thyroid cancer an unusual or allergic reaction to dulaglutide, other medicines, foods, dyes, or preservatives pregnant or trying to get pregnant breast-feeding How should I use this  medication? This medicine is injected under the skin. You will be taught how to prepare and give it. Take it as directed on the prescription label on the same day of each week. Do NOT prime the pen. Keep taking it unless your health care providertells you to stop. If you use this medicine with insulin, you should inject this medicine and the insulin separately. Do not mix them together. Do not give the injections rightnext to each other. Change (rotate) injection sites with each injection. This drug comes with INSTRUCTIONS FOR USE. Ask your pharmacist for directions on how to use this medicine. Read the information carefully. Talk to yourpharmacist or health care provider if you have questions. It is important that you put your used needles and syringes in a special sharps container. Do not put them in a trash can. If you do not have a sharpscontainer, call your pharmacist or health care provider to get one. A special MedGuide will be given to you by the pharmacist with eachprescription and refill. Be sure to read this information carefully each time. Talk to your health care provider about the use of this medicine in children.Special care may be needed. Overdosage: If you think you have taken too much of this medicine contact apoison control center or emergency room at once. NOTE: This medicine is only for you. Do not share this medicine with others. What if I miss a dose? If you miss a dose, take it as soon as you can unless it is more than 3 days late. If it is more  than 3 days late, skip the missed dose. Take the next doseat the normal time. What may interact with this medication? other medicines for diabetes Many medications may cause changes in blood sugar, these include: alcohol containing beverages antiviral medicines for HIV or AIDS aspirin and aspirin-like drugs certain medicines for blood pressure, heart disease, irregular heart beat chromium diuretics female hormones, such as  estrogens or progestins, birth control pills fenofibrate gemfibrozil isoniazid lanreotide female hormones or anabolic steroids MAOIs like Carbex, Eldepryl, Marplan, Nardil, and Parnate medicines for allergies, asthma, cold, or cough medicines for depression, anxiety, or psychotic disturbances medicines for weight loss niacin nicotine NSAIDs, medicines for pain and inflammation, like ibuprofen or naproxen octreotide pasireotide pentamidine phenytoin probenecid quinolone antibiotics such as ciprofloxacin, levofloxacin, ofloxacin some herbal dietary supplements steroid medicines such as prednisone or cortisone sulfamethoxazole; trimethoprim thyroid hormones Some medications can hide the warning symptoms of low blood sugar (hypoglycemia). You may need to monitor your blood sugar more closely if youare taking one of these medications. These include: beta-blockers, often used for high blood pressure or heart problems (examples include atenolol, metoprolol, propranolol) clonidine guanethidine reserpine This list may not describe all possible interactions. Give your health care provider a list of all the medicines, herbs, non-prescription drugs, or dietary supplements you use. Also tell them if you smoke, drink alcohol, or use illegaldrugs. Some items may interact with your medicine. What should I watch for while using this medication? Visit your health care provider for regular checks on your progress. Check with your health care provider if you have severe diarrhea, nausea, and vomiting, or if you sweat a lot. The loss of too much body fluid may make itdangerous for you to take this medicine. A test called the HbA1C (A1C) will be monitored. This is a simple blood test. It measures your blood sugar control over the last 2 to 3 months. You willreceive this test every 3 to 6 months. Learn how to check your blood sugar. Learn the symptoms of low and high bloodsugar and how to manage  them. Always carry a quick-source of sugar with you in case you have symptoms of low blood sugar. Examples include hard sugar candy or glucose tablets. Make sure others know that you can choke if you eat or drink when you develop serious symptoms of low blood sugar, such as seizures or unconsciousness. Get medicalhelp at once. Tell your health care provider if you have high blood sugar. You might need to change the dose of your medicine. If you are sick or exercising more thanusual, you may need to change the dose of your medicine. Do not skip meals. Ask your health care provider if you should avoid alcohol. Many nonprescription cough and cold products contain sugar or alcohol. Thesecan affect blood sugar. Pens should never be shared. Even if the needle is changed, sharing may resultin passing of viruses like hepatitis or HIV. Wear a medical ID bracelet or chain. Carry a card that describes yourcondition. List the medicines and doses you take on the card. What side effects may I notice from receiving this medication? Side effects that you should report to your doctor or health care professionalas soon as possible: allergic reactions (skin rash, itching or hives; swelling of the face, lips, or tongue) changes in vision diarrhea that continues or is severe infection (fever, chills, cough, sore throat, pain or trouble passing urine) kidney injury (trouble passing urine or change in the amount of urine) low blood sugar (feeling anxious; confusion; dizziness; increased  hunger; unusually weak or tired; increased sweating; shakiness; cold, clammy skin; irritable; headache; blurred vision; fast heartbeat; loss of consciousness) lump or swelling on the neck trouble breathing trouble swallowing unusual stomach upset or pain vomiting Side effects that usually do not require medical attention (report to yourdoctor or health care professional if they continue or are bothersome): lack or loss of  appetite nausea pain, redness, or irritation at site where injected This list may not describe all possible side effects. Call your doctor for medical advice about side effects. You may report side effects to FDA at1-800-FDA-1088. Where should I keep my medication? Keep out of the reach of children and pets. Refrigeration (preferred): Store unopened pens in a refrigerator between 2 and 8 degrees C (36 and 46 degrees F). Keep it in the original carton until you are ready to take it. Do not freeze or use if the medicine has been frozen. Protect from light. Get rid of any unused medicine after the expiration date on thelabel. Room Temperature: The pen may be stored at room temperature below 30 degrees C (86 degrees F) for up to a total of 14 days if needed. Protect from light. Avoid exposure to extreme heat. If it is stored at room temperature, throw awayany unused medicine after 14 days or after it expires, whichever is first. To get rid of medicines that are no longer needed or have expired: Take the medicine to a medicine take-back program. Check with your pharmacy or law enforcement to find a location. If you cannot return the medicine, ask your pharmacist or health care provider how to get rid of this medicine safely. NOTE: This sheet is a summary. It may not cover all possible information. If you have questions about this medicine, talk to your doctor, pharmacist, orhealth care provider.  2022 Elsevier/Gold Standard (2020-10-16 07:35:51)

## 2023-06-16 NOTE — Assessment & Plan Note (Signed)
LDL 61.  Compliant with aspirin 81 mg daily.  She will follow annually with cardiology

## 2023-06-18 ENCOUNTER — Encounter (INDEPENDENT_AMBULATORY_CARE_PROVIDER_SITE_OTHER): Payer: Self-pay | Admitting: Family Medicine

## 2023-06-18 ENCOUNTER — Encounter: Payer: Self-pay | Admitting: Family

## 2023-06-18 ENCOUNTER — Ambulatory Visit (INDEPENDENT_AMBULATORY_CARE_PROVIDER_SITE_OTHER): Payer: PPO | Admitting: Family Medicine

## 2023-06-18 ENCOUNTER — Telehealth (INDEPENDENT_AMBULATORY_CARE_PROVIDER_SITE_OTHER): Payer: Self-pay

## 2023-06-18 VITALS — BP 102/64 | HR 79 | Temp 97.9°F | Ht 63.0 in | Wt 199.0 lb

## 2023-06-18 DIAGNOSIS — E1165 Type 2 diabetes mellitus with hyperglycemia: Secondary | ICD-10-CM | POA: Diagnosis not present

## 2023-06-18 DIAGNOSIS — Z6835 Body mass index (BMI) 35.0-35.9, adult: Secondary | ICD-10-CM | POA: Diagnosis not present

## 2023-06-18 DIAGNOSIS — Z7985 Long-term (current) use of injectable non-insulin antidiabetic drugs: Secondary | ICD-10-CM

## 2023-06-18 DIAGNOSIS — E1169 Type 2 diabetes mellitus with other specified complication: Secondary | ICD-10-CM | POA: Diagnosis not present

## 2023-06-18 DIAGNOSIS — Z794 Long term (current) use of insulin: Secondary | ICD-10-CM

## 2023-06-18 DIAGNOSIS — I152 Hypertension secondary to endocrine disorders: Secondary | ICD-10-CM | POA: Diagnosis not present

## 2023-06-18 DIAGNOSIS — R0602 Shortness of breath: Secondary | ICD-10-CM | POA: Diagnosis not present

## 2023-06-18 DIAGNOSIS — E1159 Type 2 diabetes mellitus with other circulatory complications: Secondary | ICD-10-CM

## 2023-06-18 DIAGNOSIS — F3289 Other specified depressive episodes: Secondary | ICD-10-CM | POA: Diagnosis not present

## 2023-06-18 DIAGNOSIS — E785 Hyperlipidemia, unspecified: Secondary | ICD-10-CM

## 2023-06-18 DIAGNOSIS — R5383 Other fatigue: Secondary | ICD-10-CM

## 2023-06-18 DIAGNOSIS — D509 Iron deficiency anemia, unspecified: Secondary | ICD-10-CM

## 2023-06-18 DIAGNOSIS — E559 Vitamin D deficiency, unspecified: Secondary | ICD-10-CM | POA: Diagnosis not present

## 2023-06-18 DIAGNOSIS — G4733 Obstructive sleep apnea (adult) (pediatric): Secondary | ICD-10-CM

## 2023-06-18 MED ORDER — TIRZEPATIDE 2.5 MG/0.5ML ~~LOC~~ SOAJ
2.5000 mg | SUBCUTANEOUS | 0 refills | Status: DC
Start: 2023-06-18 — End: 2023-07-23

## 2023-06-18 NOTE — Progress Notes (Signed)
Chief Complaint:   OBESITY Vanessa Higgins (MR# [id number]) is a 72 y.o. female who presents for evaluation and treatment of obesity and related comorbidities. Current BMI is Body mass index is 35.25 kg/m. Carrera has been struggling with her weight for many years and has been unsuccessful in either losing weight, maintaining weight loss, or reaching her healthy weight goal.  Kendrianna is currently in the action stage of change and ready to dedicate time achieving and maintaining a healthier weight. Mane is interested in becoming our patient and working on intensive lifestyle modifications including (but not limited to) diet and exercise for weight loss.  Patient voices that she stopped coming on 11/29/20 due to insurance no longer covering her visits.  She mentions she did well following plan even after leaving the clinic but then due to the upheaval in her family she started emotionally eating and regained her weight.  She previously tried Ozempic but couldn't tolerate it due to nausea and vomiting. 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  She works 20-30 hours a week- 6:45am to 2/3pm.  Lives at home with grandson Pennie Rushing who is 55.  Family is supportive of her.  She doesn't like cooking and she finds it difficult to cook for 1 person.  Food recall: Wakes up and has a piece of toast with some butter and tablespoon of jelly, 8oz glass of 2% milk (satisfied).  Lunch is 11/11:30am usually wheat wrap with chicken lettuce tomato (made at work) or salad with few tablespoons of chicken and low fat [place] dressing.  Very occasionally she will have a small bag of chips.  Feels satisfied from this meal.  6pm dinner she may go out and go get a salad with chicken on it or beans (3/4 cup) and vegetables (1 ear corn on the cob) or 0.5 cup potatoes.  She may or may not have meat at dinner. May have a small block of cheese or two before bed.   Egan's habits were reviewed today and are as follows: Her family  eats meals together, she thinks her family will eat healthier with her, she started gaining weight when she was married, her heaviest weight ever was 265 pounds, she has significant food cravings issues, she snacks frequently in the evenings, she is frequently drinking liquids with calories, she frequently makes poor food choices, she has problems with excessive hunger, and she struggles with emotional eating.  Depression Screen Savahna's Food and Mood (modified PHQ-9) score was 11.  Subjective:   1. Other fatigue Dawnn admits to daytime somnolence and denies waking up still tired. Patient has a history of symptoms of daytime fatigue. Aashvi generally gets 7 hours of sleep per night, and states that she has generally restful sleep. Snoring is present. Apneic episodes are present. Epworth Sleepiness Score is 11. EKG done on 04/22/2023 normal sinus rhythm.   2. SOBOE (shortness of breath on exertion) Kenyana notes increasing shortness of breath with exercising and seems to be worsening over time with weight gain. She notes getting out of breath sooner with activity than she used to. This has not gotten worse recently. Kyarra denies shortness of breath at rest or orthopnea.  3. Type 2 diabetes mellitus with hyperglycemia, with long-term current use of insulin (HCC) Patient is on Lantus, metformin, Farxiga, and was prescribed Trulicity but she did not get it yet.  She is awaiting continuous glucose monitor.  Last A1c was 9.0 last week.  4. Hypertension associated with  diabetes (HCC) Patient's blood pressure is very well-controlled today.  She is on Cardizem and Micardis HCT.  Patient denies chest pain, chest pressure, or headache.  5. Hyperlipidemia associated with type 2 diabetes mellitus (HCC) Patient's triglycerides were elevated on her last lab, and LDL and HDL were within normal limits.  She is on Lipitor.  6. Vitamin D deficiency Patient is on a OTC vitamin D.  She denies nausea, vomiting, or  muscle weakness but notes fatigue.  7. OSA (obstructive sleep apnea) Patient has CPAP and wears it nightly, and she notes improved quality of sleep with CPAP.  She is wondering about changing to a nasal cannula.  8. Iron deficiency anemia, unspecified iron deficiency anemia type Patient has a historical diagnosis.  Her last MCV was 74 three years ago.  She has a history of B12 deficiency as well.  9. Other depression, with emotional eating Patient is on Effexor 150 mg. She denies suicidal or homicidal ideations. Her symptoms of depression is well managed but she has significant emotional eating tendencies.   Assessment/Plan:   1. Other fatigue Kazi does feel that her weight is causing her energy to be lower than it should be. Fatigue may be related to obesity, depression or many other causes. Labs will be ordered, and in the meanwhile, Korrie will focus on self care including making healthy food choices, increasing physical activity and focusing on stress reduction.  - TSH - T4, free - T3  2. SOBOE (shortness of breath on exertion) Tonna does feel that she gets out of breath more easily that she used to when she exercises. Simya's shortness of breath appears to be obesity related and exercise induced. She has agreed to work on weight loss and gradually increase exercise to treat her exercise induced shortness of breath. Will continue to monitor closely.  3. Type 2 diabetes mellitus with hyperglycemia, with long-term current use of insulin (HCC) We will check labs today, and we will follow-up on patient's blood sugars at her next appointment.  Patient agreed to start Mounjaro 2.5 mg subcu weekly with no refills.  - tirzepatide [facility]) 2.5 MG/0.5ML Pen; Inject 2.5 mg into the skin once a week.  Dispense: 2 mL; Refill: 0 - C-peptide  4. Hypertension associated with diabetes (HCC) We will check labs today.  Patient will continue above medications, and titrate medications as  tolerated.  - Comprehensive metabolic panel  5. Hyperlipidemia associated with type 2 diabetes mellitus (HCC) We will check labs today, we will follow-up at patient's next appointment.  - Lipid Panel With LDL/HDL Ratio  6. Vitamin D deficiency We will check labs today, we will follow-up at patient's next appointment.  - VITAMIN D 25 Hydroxy (Vit-D Deficiency, Fractures)  7. OSA (obstructive sleep apnea) We will follow-up on treatment plan and whether patient switch to nasal cannula at her next appointment.  8. Iron deficiency anemia, unspecified iron deficiency anemia type We will check labs today, and we will follow-up at patient's next appointment.  - Anemia panel  9. Other depression, with emotional eating Patient was referred to Dr. Dewaine Conger, our Bariatric Psychologist, for evaluation.  10. BMI 35.0-35.9,adult  11. Obesity with starting BMI of 36.3 Hadasa is currently in the action stage of change and her goal is to continue with weight loss efforts. I recommend Anureet begin the structured treatment plan as follows:  She has agreed to the Category 3 Plan with 8 oz of protein.  Exercise goals: No exercise has been prescribed at this  time.   Behavioral modification strategies: increasing lean protein intake, meal planning and cooking strategies, keeping healthy foods in the home, and planning for success.  She was informed of the importance of frequent follow-up visits to maximize her success with intensive lifestyle modifications for her multiple health conditions. She was informed we would discuss her lab results at her next visit unless there is a critical issue that needs to be addressed sooner. Brycelyn agreed to keep her next visit at the agreed upon time to discuss these results.  Objective:   Blood pressure 102/64, pulse 79, temperature 97.9 F (36.6 C), height 5\' 3"  (1.6 m), weight 199 lb (90.3 kg), SpO2 95 %. Body mass index is 35.25 kg/m.  EKG: Normal sinus rhythm,  rate 82 BPM.  Indirect Calorimeter completed today shows a VO2 of 255 and a REE of 1757.  Her calculated basal metabolic rate is 1610 thus her basal metabolic rate is better than expected.  General: Cooperative, alert, well developed, in no acute distress. HEENT: Conjunctivae and lids unremarkable. Cardiovascular: Regular rhythm.  Lungs: Normal work of breathing. Neurologic: No focal deficits.   Lab Results  Component Value Date   CREATININE 0.87 05/01/2023   BUN 34 (H) 05/01/2023   NA 136 05/01/2023   K 3.6 05/01/2023   CL 97 (L) 05/01/2023   CO2 28 05/01/2023   Lab Results  Component Value Date   ALT 20 10/28/2022   AST 21 10/28/2022   ALKPHOS 47 10/28/2022   BILITOT 0.8 12/19/2022   Lab Results  Component Value Date   HGBA1C 9.0 (A) 06/16/2023   HGBA1C 8.5 (A) 03/14/2023   HGBA1C 8.4 (H) 10/28/2022   HGBA1C 6.4 (A) 07/10/2022   HGBA1C 6.8 (H) 03/12/2022   No results found for: "INSULIN" Lab Results  Component Value Date   TSH 1.03 10/28/2022   Lab Results  Component Value Date   CHOL 153 10/28/2022   HDL 52.40 10/28/2022   LDLCALC 61 10/28/2022   TRIG 200.0 (H) 10/28/2022   CHOLHDL 3 10/28/2022   Lab Results  Component Value Date   WBC 6.9 03/24/2023   HGB 13.5 03/24/2023   HCT 42.8 03/24/2023   MCV 95.5 03/24/2023   PLT 253 03/24/2023   Lab Results  Component Value Date   IRON 83 04/10/2021   TIBC 401 07/12/2020   FERRITIN 63.9 04/10/2021   Attestation Statements:   Reviewed by clinician on day of visit: allergies, medications, problem list, medical history, surgical history, family history, social history, and previous encounter notes.   I, Burt Knack, am acting as transcriptionist for Reuben Likes, MD.  This is the patient's first visit at [facility]. The patient's NEW PATIENT PACKET was reviewed at length. Included in the packet: current and past health history, medications, allergies, ROS, gynecologic history (women  only), surgical history, family history, social history, weight history, weight loss surgery history (for those that have had weight loss surgery), nutritional evaluation, mood and food questionnaire, PHQ9, Epworth questionnaire, sleep habits questionnaire, patient life and health improvement goals questionnaire. These will all be scanned into the patient's chart under media.   During the visit, I independently reviewed the patient's EKG, bioimpedance scale results, and indirect calorimeter results. I used this information to tailor a meal plan for the patient that will help her to lose weight and will improve her obesity-related conditions going forward. I performed a medically necessary appropriate examination and/or evaluation. I discussed the assessment and treatment plan with the patient.  The patient was provided an opportunity to ask questions and all were answered. The patient agreed with the plan and demonstrated an understanding of the instructions. Labs were ordered at this visit and will be reviewed at the next visit unless more critical results need to be addressed immediately. Clinical information was updated and documented in the EMR.    I have reviewed the above documentation for accuracy and completeness, and I agree with the above. - Reuben Likes, MD

## 2023-06-18 NOTE — Telephone Encounter (Signed)
Prior Berkley Harvey has been started for [facility].  [employer] is processing your PA request and will respond shortly with next steps. You may close this dialog, return to your dashboard, and perform other tasks. To check for an update later, open this request again from your dashboard.  If you need assistance, please chat with CoverMyMeds or call us at [phone].

## 2023-06-19 ENCOUNTER — Ambulatory Visit (INDEPENDENT_AMBULATORY_CARE_PROVIDER_SITE_OTHER): Payer: PPO

## 2023-06-19 VITALS — BP 127/60 | Ht 63.0 in | Wt 199.0 lb

## 2023-06-19 DIAGNOSIS — Z0000 Encounter for general adult medical examination without abnormal findings: Secondary | ICD-10-CM | POA: Diagnosis not present

## 2023-06-19 NOTE — Progress Notes (Signed)
 Subjective:   Vanessa Higgins is a 72 y.o. female who presents for Medicare Annual (Subsequent) preventive examination.  Visit Complete: Virtual  I connected with  Vanessa Higgins on 06/19/23 by a audio enabled telemedicine application and verified that I am speaking with the correct person using two identifiers.  Patient Location: Home  Provider Location: Home Office  I discussed the limitations of evaluation and management by telemedicine. The patient expressed understanding and agreed to proceed.  Patient Medicare AWV questionnaire was completed by the patient on 06/17/2023; I have confirmed that all information answered by patient is correct and no changes since this date.  Review of Systems     Cardiac Risk Factors include: advanced age (>28men, >26 women);diabetes mellitus;dyslipidemia;hypertension;obesity (BMI >30kg/m2)     Objective:    Today's Vitals   06/19/23 1525  BP: 127/60  Weight: 199 lb (90.3 kg)  Height: 5\' 3"  (1.6 m)   Body mass index is 35.25 kg/m.     06/19/2023    3:35 PM 03/05/2023    6:36 AM 01/07/2022    9:15 AM 11/01/2020    1:52 PM 09/18/2020    3:40 PM 06/28/2019    9:36 AM 03/03/2019    1:00 PM  Advanced Directives  Does Patient Have a Medical Advance Directive? No Yes Yes Yes Yes Yes Yes  Type of Furniture conservator/restorer;Living will Healthcare Power of [place];Living will Healthcare Power of [place];Living will  Healthcare Power of [place];Living will Healthcare Power of [place];Living will  Does patient want to make changes to medical advance directive?  No - Patient declined No - Patient declined No - Patient declined  No - Patient declined No - Patient declined  Copy of Healthcare Power of Attorney in Chart?  Yes - validated most recent copy scanned in chart (See row information) Yes - validated most recent copy scanned in chart (See row information) Yes - validated most recent copy scanned in chart (See row information)    Yes - validated most recent copy scanned in chart (See row information)  Would patient like information on creating a medical advance directive? No - Patient declined          Current Medications (verified) Outpatient Encounter Medications as of 06/19/2023  Medication Sig   albuterol (VENTOLIN HFA) 108 (90 Base) MCG/ACT inhaler TAKE 2 PUFFS BY MOUTH EVERY 6 HOURS AS NEEDED FOR WHEEZE OR SHORTNESS OF BREATH   aspirin EC 81 MG tablet Take 1 tablet (81 mg total) by mouth daily. Swallow whole.   atorvastatin (LIPITOR) 20 MG tablet TAKE 1 TABLET BY MOUTH EVERYDAY AT BEDTIME   Budeson-Glycopyrrol-Formoterol (BREZTRI AEROSPHERE) 160-9-4.8 MCG/ACT AERO Inhale 2 puffs into the lungs in the morning and at bedtime.   Cholecalciferol (VITAMIN D) 125 MCG (5000 UT) CAPS Take 5,000 Units by mouth daily.   Continuous Glucose Monitor Sup KIT 1 Units by Does not apply route daily.   dapagliflozin propanediol (FARXIGA) 10 MG TABS tablet Take 1 tablet (10 mg total) by mouth daily before breakfast.   diltiazem (CARDIZEM CD) 240 MG 24 hr capsule TAKE 1 CAPSULE BY MOUTH EVERY DAY   diphenoxylate-atropine (LOMOTIL) 2.5-0.025 MG tablet Take 1 tablet by mouth every 6 (six) hours as needed for diarrhea or loose stools.   loratadine (CLARITIN) 10 MG tablet Take 10 mg by mouth at bedtime.    metFORMIN (GLUCOPHAGE-XR) 500 MG 24 hr tablet Take 2 tablets (1,000 mg total) by mouth 2 (two) times daily.  montelukast (SINGULAIR) 10 MG tablet TAKE 1 TABLET BY MOUTH EVERYDAY AT BEDTIME   pantoprazole (PROTONIX) 20 MG tablet Take 1 tablet (20 mg total) by mouth every morning. Take 30 minutes to hour before breakfast   Probiotic Product (PROBIOTIC PO) Take 1 capsule by mouth daily.   telmisartan-hydrochlorothiazide (MICARDIS HCT) 80-25 MG tablet TAKE 1 TABLET BY MOUTH EVERY DAY   tirzepatide (MOUNJARO) 2.5 MG/0.5ML Pen Inject 2.5 mg into the skin once a week.   venlafaxine XR (EFFEXOR-XR) 150 MG 24 hr capsule TAKE 1 CAPSULE BY MOUTH  DAILY WITH BREAKFAST.   vitamin B-12 (CYANOCOBALAMIN) 1000 MCG tablet Take 1,000 mcg by mouth at bedtime.   insulin glargine (LANTUS SOLOSTAR) 100 UNIT/ML Solostar Pen Inject 6 Units into the skin every morning. (Patient taking differently: Inject 14 Units into the skin every morning.)   Insulin Pen Needle 31G X 6 MM MISC Use one pen needle daily.   ondansetron (ZOFRAN) 4 MG tablet Take 1 tablet (4 mg total) by mouth every 8 (eight) hours as needed for nausea or vomiting.   ONETOUCH VERIO test strip USE 1 EACH BY OTHER ROUTE AS NEEDED FOR OTHER. FREESTYLE ELITE   No facility-administered encounter medications on file as of 06/19/2023.    Allergies (verified) Sulfa antibiotics, Lisinopril, Other, Ozempic (0.25 or 0.5 mg-dose) [semaglutide(0.25 or 0.5mg -dos)], and Ramipril   History: Past Medical History:  Diagnosis Date   Allergy    hay fever   Anemia    Ankle fracture, left 04/08/2014   Anxiety and depression    Arthritis    Asthma    well controlled    Back pain    Blood transfusion abn reaction or complication, no procedure mishap    Blood transfusion without reported diagnosis    Cerebral aneurysm    Chest pain    COPD (chronic obstructive pulmonary disease) (HCC)    Coronary artery disease    COVID-19 virus infection 08/2020   Depression    Diabetes mellitus    Disc displacement, lumbar    Dysrhythmia    pvc's   Fatty liver    GERD (gastroesophageal reflux disease)    Headache(784.0)    History of hiatal hernia    small   Hyperlipidemia    Hypertension    Joint pain    Low vitamin B12 level    Low vitamin D level    Migraine    Mitral valve prolapse    Obstructive sleep apnea    CPAP   Osteoarthritis    Palpitations    PONV (postoperative nausea and vomiting)    h/o in the past   Sleep apnea 04/06/2017   uses CPAP   SOB (shortness of breath)    UTI (lower urinary tract infection)    Vertigo    1 episode, several years ago   Vitamin B 12 deficiency     Vitamin D deficiency    Wears glasses    Past Surgical History:  Procedure Laterality Date   APPENDECTOMY  1977   CATARACT EXTRACTION W/PHACO Right 02/19/2023   Procedure: CATARACT EXTRACTION PHACO AND INTRAOCULAR LENS PLACEMENT (IOC) RIGHT DIABETIC  7.13  00:46.7;  Surgeon: Lockie Mola, MD;  Location: [facility];  Service: Ophthalmology;  Laterality: Right;  Diabetic   CATARACT EXTRACTION W/PHACO Left 03/05/2023   Procedure: CATARACT EXTRACTION PHACO AND INTRAOCULAR LENS PLACEMENT (IOC) LEFT DIABETIC  6.03  00:36.4;  Surgeon: Lockie Mola, MD;  Location: [facility];  Service: Ophthalmology;  Laterality: Left;  Diabetic   COLONOSCOPY  last 11/17/2014   INGUINAL HERNIA REPAIR Right 11/01/2020   Procedure: HERNIA REPAIR INGUINAL ADULT;  Surgeon: Earline Mayotte, MD;  Location: [facility] OR[facility];  Service: General;  Laterality: Right;   JOINT REPLACEMENT     KNEE ARTHROPLASTY Right 03/03/2019   Procedure: COMPUTER ASSISTED TOTAL KNEE ARTHROPLASTY-RIGHT;  Surgeon: Donato Heinz, MD;  Location: [facility];  Service: Orthopedics;  Laterality: Right;   LUMBAR LAMINECTOMY/DECOMPRESSION MICRODISCECTOMY Right 02/06/2018   Procedure: Lumbar two Hemilaminectomy for Discectomy;  Surgeon: Coletta Memos, MD;  [facility];  Service: Neurosurgery;  Laterality: Right;   ORIF ANKLE FRACTURE Left 04/08/2014   Procedure: OPEN REDUCTION INTERNAL FIXATION (ORIF) ANKLE FRACTURE LEFT ANKLE FRACTURE OPEN TREATMENT BIMALLEOLAR ANKLE INCLUDES INTERNAL FIXATION;  Surgeon: Eulas Post, MD;  Location:  SURGERY CENTER;  Service: Orthopedics;  Laterality: Left;   PAROTIDECTOMY  1980   left   POLYPECTOMY     TONSILLECTOMY AND ADENOIDECTOMY     as a child   TOTAL KNEE ARTHROPLASTY Bilateral    left   TUBAL LIGATION  06/27/76   Family History  Problem Relation Age of Onset   Sudden death Mother    Cancer Mother 23       pancreatic   Heart disease Father 52   Sudden death Father     Cancer Sister 63       brain   Cancer Brother        colon   Colon cancer Brother 50   Stroke Maternal Grandmother    Heart disease Maternal Grandfather    Aneurysm Paternal Aunt    Breast cancer Neg Hx    Colon polyps Neg Hx    Esophageal cancer Neg Hx    Rectal cancer Neg Hx    Stomach cancer Neg Hx    Thyroid cancer Neg Hx    Social History   Socioeconomic History   Marital status: Widowed    Spouse name: Not on file   Number of children: Not on file   Years of education: Not on file   Highest education level: Associate degree: academic program  Occupational History   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 Husband deceased Jun 27, 2008.      Work - [employer] endoscopy- [employer], [employer]   Diet - healthy   Exercise - gym 2 x per week   Social Determinants of Health   Financial Resource Strain: Low Risk  (06/19/2023)   Overall Financial Resource Strain (CARDIA)    Difficulty of Paying Living Expenses: Not hard at all  Food Insecurity: No Food Insecurity (06/19/2023)   Hunger Vital Sign    Worried About Running Out of Food in the Last Year: Never true    Ran Out of Food in the Last Year: Never true  Transportation Needs: No Transportation Needs (06/19/2023)   PRAPARE - Administrator, Civil Service (Medical): No    Lack of Transportation (Non-Medical): No  Physical Activity: Inactive (06/19/2023)   Exercise Vital Sign    Days of Exercise per Week: 0 days    Minutes of  Exercise per Session: 0 min  Stress: No Stress Concern Present (06/19/2023)   [employer] of Occupational Health - Occupational Stress Questionnaire    Feeling of Stress : Not  at all  Social Connections: Moderately Isolated (06/19/2023)   Social Connection and Isolation Panel [NHANES]    Frequency of Communication with Friends and Family: Three times a week    Frequency of Social Gatherings with Friends and Family: Three times a week    Attends Religious Services: More than 4 times per year    Active Member of Clubs or Organizations: No    Attends Banker Meetings: Never    Marital Status: Widowed    Tobacco Counseling Counseling given: Yes   Clinical Intake:  Pre-visit preparation completed: Yes  Pain : No/denies pain     BMI - recorded: 35.25 Nutritional Status: BMI > 30  Obese Nutritional Risks: None Diabetes: Yes CBG done?: No Did pt. bring in CBG monitor from home?: No  How often do you need to have someone help you when you read instructions, pamphlets, or other written materials from your doctor or pharmacy?: 1 - Never  Interpreter Needed?: No  Information entered by ::  Josue Falconi, CMA   Activities of Daily Living    06/19/2023    3:37 PM 06/17/2023    9:12 AM  In your present state of health, do you have any difficulty performing the following activities:  Hearing? 0 0  Vision? 0 0  Difficulty concentrating or making decisions? 0 0  Walking or climbing stairs? 0 0  Dressing or bathing? 0 0  Doing errands, shopping? 0 0  Preparing Food and eating ? N N  Using the Toilet? N N  In the past six months, have you accidently leaked urine? Y Y  Do you have problems with loss of bowel control? Y Y  Managing your Medications? N N  Managing your Finances? N N  Housekeeping or managing your Housekeeping? N N    Patient Care Team: Allegra Grana, FNP as PCP - General (Family Medicine) End, Cristal Deer, MD as PCP - Cardiology (Cardiology)  Indicate any recent Medical Services you may have received from other than [facility] providers in the past year (date may be approximate).     Assessment:   This is a routine  wellness examination for Vanessa Higgins.  Hearing/Vision screen Hearing Screening - Comments:: Patient denies any hearing difficulties.   Vision Screening - Comments:: Patient states they wear reading glasses only.    Dietary issues and exercise activities discussed:     Goals Addressed             This Visit's Progress    Patient Stated       Goal is to lose ~20lbs and get better control of her diabetes       Depression Screen    06/19/2023    3:33 PM 06/16/2023    2:35 PM 03/14/2023   10:26 AM 10/28/2022    9:06 AM 07/10/2022    3:33 PM 03/12/2022   10:41 AM 01/07/2022    9:14 AM  PHQ 2/9 Scores  PHQ - 2 Score 0 0 0 1 0 0 0  PHQ- 9 Score      0     Fall Risk    06/19/2023    3:35 PM 06/17/2023    9:12 AM 06/16/2023    2:34 PM 03/14/2023   10:26 AM 11/20/2022    3:23 PM  Fall Risk  Falls in the past year? 1 1 0 0 0  Number falls in past yr: 0 0 0 0 0  Injury with Fall? 0 0 0 0 0  Risk for fall due to : No Fall Risks  No Fall Risks No Fall Risks No Fall Risks  Follow up Falls prevention discussed  Falls evaluation completed Falls evaluation completed Falls evaluation completed    MEDICARE RISK AT HOME:  Medicare Risk at Home - 06/19/23 1532     Any stairs in or around the home? Yes    If so, are there any without handrails? No    Home free of loose throw rugs in walkways, pet beds, electrical cords, etc? Yes    Adequate lighting in your home to reduce risk of falls? Yes    Life alert? No    Use of a cane, walker or w/c? No    Grab bars in the bathroom? No    Shower chair or bench in shower? No    Elevated toilet seat or a handicapped toilet? No             TIMED UP AND GO:  Was the test performed?  No    Cognitive Function:        06/19/2023    3:38 PM  6CIT Screen  What Year? 0 points  What month? 0 points  What time? 0 points  Count back from 20 0 points  Months in reverse 0 points  Repeat phrase 0 points  Total Score 0 points     Immunizations Immunization History  Administered Date(s) Administered   COVID-19, mRNA, vaccine(Comirnaty)12 years and older 10/22/2022   Fluad Quad(high Dose 65+) 10/02/2019   Influenza-Unspecified 09/02/2014, 10/02/2015, 09/28/2020, 09/28/2021   MMR 07/09/2010   PFIZER Comirnaty(Gray Top)Covid-19 Tri-Sucrose Vaccine 12/02/2019, 01/04/2020, 12/14/2020, 06/01/2021   PFIZER(Purple Top)SARS-COV-2 Vaccination 12/02/2019, 01/04/2020, 12/14/2020   PNEUMOCOCCAL CONJUGATE-20 08/19/2022   Pneumococcal Conjugate-13 12/02/2014   Pneumococcal Polysaccharide-23 07/09/2001, 12/04/2016   Td 07/09/2010   Tdap 08/22/2021   Zoster Recombinat (Shingrix) 06/29/2022   Zoster, Live 10/27/2013    TDAP status: Up to date  Flu Vaccine status: Up to date  Pneumococcal vaccine status: Up to date  Covid-19 vaccine status: Information provided on how to obtain vaccines.   Qualifies for Shingles Vaccine? Yes   Zostavax completed Yes   Shingrix Completed?: Yes  Screening Tests Health Maintenance  Topic Date Due   Zoster Vaccines- Shingrix (2 of 2) 08/24/2022   INFLUENZA VACCINE  07/31/2023   Diabetic kidney evaluation - Urine ACR  10/29/2023   HEMOGLOBIN A1C  12/16/2023   OPHTHALMOLOGY EXAM  01/31/2024   FOOT EXAM  03/13/2024   Diabetic kidney evaluation - eGFR measurement  04/30/2024   Medicare Annual Wellness (AWV)  06/18/2024   MAMMOGRAM  10/15/2024   Colonoscopy  12/07/2024   DTaP/Tdap/Td (3 - Td or Tdap) 08/23/2031   Pneumonia Vaccine 76+ Years old  Completed   DEXA SCAN  Completed   COVID-19 Vaccine  Completed   Hepatitis C Screening  Completed   HPV VACCINES  Aged Out    Health Maintenance  Health Maintenance Due  Topic Date Due   Zoster Vaccines- Shingrix (2 of 2) 08/24/2022    Colorectal cancer screening: Type of screening: Colonoscopy. Completed 12/08/2019. Repeat every 5 years  Mammogram status: Completed 10/15/2022. Repeat every year  Bone Density status: Completed  08/29/21. Results reflect: Bone density results: OSTEOPENIA. Repeat every 2 years.  Lung Cancer Screening: (Low Dose CT  Chest recommended if Age 5-80 years, 20 pack-year currently smoking OR have quit w/in 15years.) does not qualify.   Lung Cancer Screening Referral: n/a  Additional Screening:  Hepatitis C Screening: does not qualify; Completed 10/02/2016  Vision Screening: Recommended annual ophthalmology exams for early detection of glaucoma and other disorders of the eye. Is the patient up to date with their annual eye exam?  Yes  Who is the provider or what is the name of the office in which the patient attends annual eye exams?  If pt is not established with a provider, would they like to be referred to a provider to establish care? No .   Dental Screening: Recommended annual dental exams for proper oral hygiene  Diabetic Foot Exam: Diabetic Foot Exam: Completed 03/14/2023  Community Resource Referral / Chronic Care Management: CRR required this visit?  No   CCM required this visit?  No     Plan:     I have personally reviewed and noted the following in the patient's chart:   Medical and social history Use of alcohol, tobacco or illicit drugs  Current medications and supplements including opioid prescriptions. Patient is not currently taking opioid prescriptions. Functional ability and status Nutritional status Physical activity Advanced directives List of other physicians Hospitalizations, surgeries, and ER visits in previous 12 months Vitals Screenings to include cognitive, depression, and falls Referrals and appointments  In addition, I have reviewed and discussed with patient certain preventive protocols, quality metrics, and best practice recommendations. A written personalized care plan for preventive services as well as general preventive health recommendations were provided to patient.     Jordan Hawks Triston Skare, CMA   06/19/2023   After Visit Summary: (MyChart)  Due to this being a telephonic visit, the after visit summary with patients personalized plan was offered to patient via MyChart   Because this visit was a virtual/telehealth visit,  certain criteria was not obtained, such a blood pressure, CBG if patient is a diabetic, and timed up and go.

## 2023-06-19 NOTE — Patient Instructions (Signed)
Vanessa Higgins , Thank you for taking time to come for your Medicare Wellness Visit. I appreciate your ongoing commitment to your health goals. Please review the following plan we discussed and let me know if I can assist you in the future.   These are the goals we discussed:  Goals       Patient Stated      Goal is to lose ~20lbs and get better control of her diabetes      Weight (lb) < 160 lb (72.6 kg) (pt-stated)      Healthy diet Walk for exercise        This is a list of the screening recommended for you and due dates:  Health Maintenance  Topic Date Due   Zoster (Shingles) Vaccine (2 of 2) 08/24/2022   Flu Shot  07/31/2023   Yearly kidney health urinalysis for diabetes  10/29/2023   Hemoglobin A1C  12/16/2023   Eye exam for diabetics  01/31/2024   Complete foot exam   03/13/2024   Yearly kidney function blood test for diabetes  04/30/2024   Medicare Annual Wellness Visit  06/18/2024   Mammogram  10/15/2024   Colon Cancer Screening  12/07/2024   DTaP/Tdap/Td vaccine (3 - Td or Tdap) 08/23/2031   Pneumonia Vaccine  Completed   DEXA scan (bone density measurement)  Completed   COVID-19 Vaccine  Completed   Hepatitis C Screening  Completed   HPV Vaccine  Aged Out    Advanced directives: Advance directive discussed with you today. Even though you declined this today, please call our office should you change your mind, and we can give you the proper paperwork for you to fill out. Advance care planning is a way to make decisions about medical care that fits your values in case you are ever unable to make these decisions for yourself.  Information on Advanced Care Planning can be found at [facility] Advance Health Care Directives Advance Health Care Directives ([email or web address]    Conditions/risks identified: Aim for 30 minutes of exercise or brisk walking, 6-8 glasses of water, and 5 servings of fruits and vegetables each day.   Next appointment:  VIRTUAL/TELEPHONE APPOINTMENT Follow up in one year for your annual wellness visit  June 22, 2023 at 3:30pm    Preventive Care 65 Years and Older, Female Preventive care refers to lifestyle choices and visits with your health care provider that can promote health and wellness. What does preventive care include? A yearly physical exam. This is also called an annual well check. Dental exams once or twice a year. Routine eye exams. Ask your health care provider how often you should have your eyes checked. Personal lifestyle choices, including: Daily care of your teeth and gums. Regular physical activity. Eating a healthy diet. Avoiding tobacco and drug use. Limiting alcohol use. Practicing safe sex. Taking low-dose aspirin every day. Taking vitamin and mineral supplements as recommended by your health care provider. What happens during an annual well check? The services and screenings done by your health care provider during your annual well check will depend on your age, overall health, lifestyle risk factors, and family history of disease. Counseling  Your health care provider may ask you questions about your: Alcohol use. Tobacco use. Drug use. Emotional well-being. Home and relationship well-being. Sexual activity. Eating habits. History of falls. Memory and ability to understand (cognition). Work and work Astronomer. Reproductive health. Screening  You may have the following tests or measurements: Height, weight, and  BMI. Blood pressure. Lipid and cholesterol levels. These may be checked every 5 years, or more frequently if you are over 34 years old. Skin check. Lung cancer screening. You may have this screening every year starting at age 60 if you have a 30-pack-year history of smoking and currently smoke or have quit within the past 15 years. Fecal occult blood test (FOBT) of the stool. You may have this test every year starting at age 23. Flexible sigmoidoscopy or  colonoscopy. You may have a sigmoidoscopy every 5 years or a colonoscopy every 10 years starting at age 30. Hepatitis C blood test. Hepatitis B blood test. Sexually transmitted disease (STD) testing. Diabetes screening. This is done by checking your blood sugar (glucose) after you have not eaten for a while (fasting). You may have this done every 1-3 years. Bone density scan. This is done to screen for osteoporosis. You may have this done starting at age 62. Mammogram. This may be done every 1-2 years. Talk to your health care provider about how often you should have regular mammograms. Talk with your health care provider about your test results, treatment options, and if necessary, the need for more tests. Vaccines  Your health care provider may recommend certain vaccines, such as: Influenza vaccine. This is recommended every year. Tetanus, diphtheria, and acellular pertussis (Tdap, Td) vaccine. You may need a Td booster every 10 years. Zoster vaccine. You may need this after age 41. Pneumococcal 13-valent conjugate (PCV13) vaccine. One dose is recommended after age 81. Pneumococcal polysaccharide (PPSV23) vaccine. One dose is recommended after age 47. Talk to your health care provider about which screenings and vaccines you need and how often you need them. This information is not intended to replace advice given to you by your health care provider. Make sure you discuss any questions you have with your health care provider. Document Released: 01/12/2016 Document Revised: 09/04/2016 Document Reviewed: 10/17/2015 Elsevier Interactive Patient Education  2017 [employer].  Fall Prevention in the Home Falls can cause injuries. They can happen to people of all ages. There are many things you can do to make your home safe and to help prevent falls. What can I do on the outside of my home? Regularly fix the edges of walkways and driveways and fix any cracks. Remove anything that might make you  trip as you walk through a door, such as a raised step or threshold. Trim any bushes or trees on the path to your home. Use bright outdoor lighting. Clear any walking paths of anything that might make someone trip, such as rocks or tools. Regularly check to see if handrails are loose or broken. Make sure that both sides of any steps have handrails. Any raised decks and porches should have guardrails on the edges. Have any leaves, snow, or ice cleared regularly. Use sand or salt on walking paths during winter. Clean up any spills in your garage right away. This includes oil or grease spills. What can I do in the bathroom? Use night lights. Install grab bars by the toilet and in the tub and shower. Do not use towel bars as grab bars. Use non-skid mats or decals in the tub or shower. If you need to sit down in the shower, use a plastic, non-slip stool. Keep the floor dry. Clean up any water that spills on the floor as soon as it happens. Remove soap buildup in the tub or shower regularly. Attach bath mats securely with double-sided non-slip rug tape. Do not  have throw rugs and other things on the floor that can make you trip. What can I do in the bedroom? Use night lights. Make sure that you have a light by your bed that is easy to reach. Do not use any sheets or blankets that are too big for your bed. They should not hang down onto the floor. Have a firm chair that has side arms. You can use this for support while you get dressed. Do not have throw rugs and other things on the floor that can make you trip. What can I do in the kitchen? Clean up any spills right away. Avoid walking on wet floors. Keep items that you use a lot in easy-to-reach places. If you need to reach something above you, use a strong step stool that has a grab bar. Keep electrical cords out of the way. Do not use floor polish or wax that makes floors slippery. If you must use wax, use non-skid floor wax. Do not have  throw rugs and other things on the floor that can make you trip. What can I do with my stairs? Do not leave any items on the stairs. Make sure that there are handrails on both sides of the stairs and use them. Fix handrails that are broken or loose. Make sure that handrails are as long as the stairways. Check any carpeting to make sure that it is firmly attached to the stairs. Fix any carpet that is loose or worn. Avoid having throw rugs at the top or bottom of the stairs. If you do have throw rugs, attach them to the floor with carpet tape. Make sure that you have a light switch at the top of the stairs and the bottom of the stairs. If you do not have them, ask someone to add them for you. What else can I do to help prevent falls? Wear shoes that: Do not have high heels. Have rubber bottoms. Are comfortable and fit you well. Are closed at the toe. Do not wear sandals. If you use a stepladder: Make sure that it is fully opened. Do not climb a closed stepladder. Make sure that both sides of the stepladder are locked into place. Ask someone to hold it for you, if possible. Clearly mark and make sure that you can see: Any grab bars or handrails. First and last steps. Where the edge of each step is. Use tools that help you move around (mobility aids) if they are needed. These include: Canes. Walkers. Scooters. Crutches. Turn on the lights when you go into a dark area. Replace any light bulbs as soon as they burn out. Set up your furniture so you have a clear path. Avoid moving your furniture around. If any of your floors are uneven, fix them. If there are any pets around you, be aware of where they are. Review your medicines with your doctor. Some medicines can make you feel dizzy. This can increase your chance of falling. Ask your doctor what other things that you can do to help prevent falls. This information is not intended to replace advice given to you by your health care provider.  Make sure you discuss any questions you have with your health care provider. Document Released: 10/12/2009 Document Revised: 05/23/2016 Document Reviewed: 01/20/2015 Elsevier Interactive Patient Education  2017 [employer].

## 2023-06-23 ENCOUNTER — Encounter: Payer: Self-pay | Admitting: Family

## 2023-06-24 ENCOUNTER — Other Ambulatory Visit: Payer: Self-pay

## 2023-06-24 DIAGNOSIS — L989 Disorder of the skin and subcutaneous tissue, unspecified: Secondary | ICD-10-CM

## 2023-06-24 NOTE — Telephone Encounter (Signed)
Checked today via cover my meds, still waiting on a response.

## 2023-06-25 ENCOUNTER — Other Ambulatory Visit
Admission: RE | Admit: 2023-06-25 | Discharge: 2023-06-25 | Disposition: A | Discharge status: Home / Self Care | Payer: PPO | Attending: Family Medicine | Admitting: Family Medicine

## 2023-06-25 ENCOUNTER — Encounter: Payer: Self-pay | Admitting: Family

## 2023-06-25 DIAGNOSIS — E559 Vitamin D deficiency, unspecified: Secondary | ICD-10-CM | POA: Diagnosis not present

## 2023-06-25 DIAGNOSIS — D509 Iron deficiency anemia, unspecified: Secondary | ICD-10-CM | POA: Diagnosis not present

## 2023-06-25 DIAGNOSIS — Z794 Long term (current) use of insulin: Secondary | ICD-10-CM | POA: Insufficient documentation

## 2023-06-25 DIAGNOSIS — E669 Obesity, unspecified: Secondary | ICD-10-CM | POA: Diagnosis not present

## 2023-06-25 DIAGNOSIS — Z6836 Body mass index (BMI) 36.0-36.9, adult: Secondary | ICD-10-CM | POA: Diagnosis not present

## 2023-06-25 DIAGNOSIS — E1165 Type 2 diabetes mellitus with hyperglycemia: Secondary | ICD-10-CM | POA: Diagnosis not present

## 2023-06-25 DIAGNOSIS — I10 Essential (primary) hypertension: Secondary | ICD-10-CM | POA: Insufficient documentation

## 2023-06-25 DIAGNOSIS — R5383 Other fatigue: Secondary | ICD-10-CM | POA: Diagnosis not present

## 2023-06-25 DIAGNOSIS — G4733 Obstructive sleep apnea (adult) (pediatric): Secondary | ICD-10-CM | POA: Insufficient documentation

## 2023-06-25 DIAGNOSIS — R0602 Shortness of breath: Secondary | ICD-10-CM | POA: Insufficient documentation

## 2023-06-25 LAB — VITAMIN D 25 HYDROXY (VIT D DEFICIENCY, FRACTURES): Vit D, 25-Hydroxy: 41.01 ng/mL (ref 30–100)

## 2023-06-25 LAB — FERRITIN: Ferritin: 9 ng/mL — ABNORMAL LOW (ref 11–307)

## 2023-06-25 LAB — IRON AND TIBC
Iron: 57 ug/dL (ref 28–170)
Saturation Ratios: 12 % (ref 10.4–31.8)
TIBC: 475 ug/dL — ABNORMAL HIGH (ref 250–450)
UIBC: 418 ug/dL

## 2023-06-25 LAB — LIPID PANEL
Cholesterol: 176 mg/dL (ref 0–200)
HDL: 56 mg/dL (ref >40)
LDL Cholesterol: 95 mg/dL (ref 0–99)
Total CHOL/HDL Ratio: 3.1 RATIO
Triglycerides: 126 mg/dL (ref <150)
VLDL: 25 mg/dL (ref 0–40)

## 2023-06-25 LAB — TSH: TSH: 0.946 u[IU]/mL (ref 0.350–4.500)

## 2023-06-25 LAB — COMPREHENSIVE METABOLIC PANEL
ALT: 21 U/L (ref 0–44)
AST: 22 U/L (ref 15–41)
Albumin: 4.1 g/dL (ref 3.5–5.0)
Alkaline Phosphatase: 45 U/L (ref 38–126)
Anion gap: 11 (ref 5–15)
BUN: 18 mg/dL (ref 8–23)
CO2: 28 mmol/L (ref 22–32)
Calcium: 9.6 mg/dL (ref 8.9–10.3)
Chloride: 99 mmol/L (ref 98–111)
Creatinine, Ser: 0.8 mg/dL (ref 0.44–1.00)
GFR, Estimated: >60 mL/min (ref >60)
Glucose, Bld: 201 mg/dL — ABNORMAL HIGH (ref 70–99)
Potassium: 4 mmol/L (ref 3.5–5.1)
Sodium: 138 mmol/L (ref 135–145)
Total Bilirubin: 1 mg/dL (ref 0.3–1.2)
Total Protein: 8 g/dL (ref 6.5–8.1)

## 2023-06-25 LAB — T4, FREE: Free T4: 0.71 ng/dL (ref 0.61–1.12)

## 2023-06-25 LAB — RETICULOCYTES
Immature Retic Fract: 24.4 % — ABNORMAL HIGH (ref 2.3–15.9)
RBC.: 4.67 MIL/uL (ref 3.87–5.11)
Retic Count, Absolute: 104.6 10*3/uL (ref 19.0–186.0)
Retic Ct Pct: 2.2 % (ref 0.4–3.1)

## 2023-06-25 LAB — FOLATE: Folate: 31 ng/mL (ref >5.9)

## 2023-06-25 LAB — VITAMIN B12: Vitamin B-12: 313 pg/mL (ref 180–914)

## 2023-06-26 LAB — T3: T3, Total: 139 ng/dL (ref 71–180)

## 2023-07-01 LAB — C-PEPTIDE: C-Peptide: 5 ng/mL — ABNORMAL HIGH (ref 1.1–4.4)

## 2023-07-04 ENCOUNTER — Other Ambulatory Visit: Payer: Self-pay | Admitting: Family

## 2023-07-04 DIAGNOSIS — Z794 Long term (current) use of insulin: Secondary | ICD-10-CM

## 2023-07-07 ENCOUNTER — Telehealth (INDEPENDENT_AMBULATORY_CARE_PROVIDER_SITE_OTHER): Payer: Self-pay | Admitting: Family Medicine

## 2023-07-08 ENCOUNTER — Ambulatory Visit (INDEPENDENT_AMBULATORY_CARE_PROVIDER_SITE_OTHER): Payer: PPO | Admitting: Family Medicine

## 2023-07-08 NOTE — Telephone Encounter (Signed)
Patient stated that she still has not reeceived her  tirzepatide [facility]) 2.5 MG/0.5ML Pen that Dr. Marquis Lunch prescribed to her. Patient has requested a call back. [facility]

## 2023-07-08 NOTE — Telephone Encounter (Signed)
Spoke w/ pt, will check with HTA for authorization determination

## 2023-07-10 ENCOUNTER — Telehealth (INDEPENDENT_AMBULATORY_CARE_PROVIDER_SITE_OTHER): Payer: PPO | Admitting: Family Medicine

## 2023-07-10 ENCOUNTER — Encounter (INDEPENDENT_AMBULATORY_CARE_PROVIDER_SITE_OTHER): Payer: Self-pay | Admitting: Family Medicine

## 2023-07-10 VITALS — BP 114/61 | HR 80 | Temp 98.3°F | Ht 63.0 in | Wt 198.0 lb

## 2023-07-10 DIAGNOSIS — Z6836 Body mass index (BMI) 36.0-36.9, adult: Secondary | ICD-10-CM | POA: Diagnosis not present

## 2023-07-10 DIAGNOSIS — E785 Hyperlipidemia, unspecified: Secondary | ICD-10-CM | POA: Diagnosis not present

## 2023-07-10 DIAGNOSIS — E1169 Type 2 diabetes mellitus with other specified complication: Secondary | ICD-10-CM

## 2023-07-10 DIAGNOSIS — Z794 Long term (current) use of insulin: Secondary | ICD-10-CM

## 2023-07-10 DIAGNOSIS — E1165 Type 2 diabetes mellitus with hyperglycemia: Secondary | ICD-10-CM

## 2023-07-10 DIAGNOSIS — D508 Other iron deficiency anemias: Secondary | ICD-10-CM

## 2023-07-10 DIAGNOSIS — E559 Vitamin D deficiency, unspecified: Secondary | ICD-10-CM

## 2023-07-10 DIAGNOSIS — E669 Obesity, unspecified: Secondary | ICD-10-CM

## 2023-07-10 MED ORDER — VITAMIN D 125 MCG (5000 UT) PO CAPS
ORAL_CAPSULE | ORAL | Status: AC
Start: 2023-07-10 — End: ?

## 2023-07-10 NOTE — Progress Notes (Signed)
TeleHealth Visit:  Due to the COVID-19 pandemic, this visit was completed with telemedicine (audio/video) technology to reduce patient and provider exposure as well as to preserve personal protective equipment.   [name] has verbally consented to this TeleHealth visit. The patient is located at home, the provider is located at the [employer] [facility] office. The participants in this visit include the listed provider and patient. The visit was conducted today via MyChart video.   Chief Complaint: OBESITY Vanessa Higgins is here to discuss her progress with her obesity treatment plan along with follow-up of her obesity related diagnoses. Vanessa Higgins is on the Category 3 Plan with 8 oz of protein and states she is following her eating plan approximately (unknown)% of the time. Vanessa Higgins states she is doing 0 minutes 0 times per week.  Today's visit was #: 2 Starting weight: 199 lbs Starting date: 06/18/2023 Today's weight: 198 lbs Today's date: 07/10/2023 Total lbs lost to date: 1 Total lbs lost since last in-office visit: 1  Interim History: Patient presents for first follow up- initial New Patient appointment was 06/18/23.  She went on a trip to the [employer] and did a Optometrist bus tour.  She has had two deaths of friends this past week and so she was very busy when she got back from her trip.  [employer] just doesn't have enough of the food options from the plan available.  Not sure what food options she should choose if she goes out to eat with her family to an [place] or [employer]. No upcoming planned trips or events so would like to stick with Category meal plan.  Was not able to get [facility]- something on prior authorization is pending from her insurance.  Subjective:   1. Type 2 diabetes mellitus with hyperglycemia, with long-term current use of insulin (HCC) Patient's recent A1c was 9.0, and C-peptide 5.0.  She is on Farxiga, Lantus, and metformin.  2. Other iron deficiency  anemia Patient's recent ferritin was low at 9.  3. Vitamin D deficiency Patient denies nausea, vomiting, or muscle weakness but notes fatigue.  She is on a OTC vitamin D.  4. Hyperlipidemia associated with type 2 diabetes mellitus (HCC) Patient is on statin currently with no side effects mentioned.  Assessment/Plan:   1. Type 2 diabetes mellitus with hyperglycemia, with long-term current use of insulin (HCC) Patient is to start [facility] (pending prior authorization).  Dexcom sensor and transmitter was sent to [facility] and pharmacy, which will contact the patient and help her figure out cost and coverage.  - Continuous Glucose Sensor (DEXCOM G6 SENSOR) MISC; 1 Units by Does not apply route every 14 (fourteen) days.  Dispense: 2 each; Refill: 0 - Continuous Glucose Transmitter (DEXCOM G6 TRANSMITTER) MISC; 1 Units by Does not apply route every 30 (thirty) days.  Dispense: 1 each; Refill: 0  2. Other iron deficiency anemia Patient is to start prenatal vitamins.  3. Vitamin D deficiency Patient agreed to increase OTC vitamin D to 10,000 IU every other day, off setting with 5000 IU.  - Cholecalciferol (VITAMIN D) 125 MCG (5000 UT) CAPS; Take 5,000 Units by mouth every Monday,Wednesday,Friday, and Sunday at 6 PM AND 10,000 Units every Tuesday, Thursday, and Saturday at 6 PM.  4. Hyperlipidemia associated with type 2 diabetes mellitus (HCC) We will repeat labs in 3 months; if LDL continues to be elevated we will increase statin.  5. BMI 36.0-36.9,adult  6. Obesity with starting BMI of 36.3 Vanessa Higgins is currently  in the action stage of change. As such, her goal is to continue with weight loss efforts. She has agreed to the Category 3 Plan with 8 oz of protein.   Exercise goals: No exercise has been prescribed at this time.  Behavioral modification strategies: increasing lean protein intake, meal planning and cooking strategies, keeping healthy foods in the home, and planning for success.  Vanessa Higgins  has agreed to follow-up with our clinic in 2 weeks. She was informed of the importance of frequent follow-up visits to maximize her success with intensive lifestyle modifications for her multiple health conditions.  Objective:   VITALS: Per patient if applicable, see vitals. GENERAL: Alert and in no acute distress. CARDIOPULMONARY: No increased WOB. Speaking in clear sentences.  PSYCH: Pleasant and cooperative. Speech normal rate and rhythm. Affect is appropriate. Insight and judgement are appropriate. Attention is focused, linear, and appropriate.  NEURO: Oriented as arrived to appointment on time with no prompting.   Lab Results  Component Value Date   CREATININE 0.80 06/25/2023   BUN 18 06/25/2023   NA 138 06/25/2023   K 4.0 06/25/2023   CL 99 06/25/2023   CO2 28 06/25/2023   Lab Results  Component Value Date   ALT 21 06/25/2023   AST 22 06/25/2023   ALKPHOS 45 06/25/2023   BILITOT 1.0 06/25/2023   Lab Results  Component Value Date   HGBA1C 9.0 (A) 06/16/2023   HGBA1C 8.5 (A) 03/14/2023   HGBA1C 8.4 (H) 10/28/2022   HGBA1C 6.4 (A) 07/10/2022   HGBA1C 6.8 (H) 03/12/2022   No results found for: "INSULIN" Lab Results  Component Value Date   TSH 0.946 06/25/2023   Lab Results  Component Value Date   CHOL 176 06/25/2023   HDL 56 06/25/2023   LDLCALC 95 06/25/2023   TRIG 126 06/25/2023   CHOLHDL 3.1 06/25/2023   Lab Results  Component Value Date   VD25OH 41.01 06/25/2023   VD25OH 52.55 10/28/2022   VD25OH 71.97 08/15/2021   Lab Results  Component Value Date   WBC 6.9 03/24/2023   HGB 13.5 03/24/2023   HCT 42.8 03/24/2023   MCV 95.5 03/24/2023   PLT 253 03/24/2023   Lab Results  Component Value Date   IRON 57 06/25/2023   TIBC 475 (H) 06/25/2023   FERRITIN 9 (L) 06/25/2023    Attestation Statements:   Reviewed by clinician on day of visit: allergies, medications, problem list, medical history, surgical history, family history, social history, and  previous encounter notes.   I, Burt Knack, am acting as transcriptionist for Reuben Likes, MD.  I have reviewed the above documentation for accuracy and completeness, and I agree with the above. - Reuben Likes, MD

## 2023-07-14 ENCOUNTER — Encounter (INDEPENDENT_AMBULATORY_CARE_PROVIDER_SITE_OTHER): Payer: Self-pay

## 2023-07-15 ENCOUNTER — Encounter (INDEPENDENT_AMBULATORY_CARE_PROVIDER_SITE_OTHER): Payer: Self-pay

## 2023-07-15 ENCOUNTER — Telehealth (INDEPENDENT_AMBULATORY_CARE_PROVIDER_SITE_OTHER): Payer: PPO | Admitting: Psychology

## 2023-07-21 MED ORDER — DEXCOM G6 SENSOR MISC
1.0000 [IU] | 0 refills | Status: DC
Start: 2023-07-21 — End: 2024-04-22

## 2023-07-21 MED ORDER — DEXCOM G6 TRANSMITTER MISC
1.0000 [IU] | 0 refills | Status: DC
Start: 2023-07-21 — End: 2024-04-22

## 2023-07-22 ENCOUNTER — Ambulatory Visit (INDEPENDENT_AMBULATORY_CARE_PROVIDER_SITE_OTHER): Payer: PPO | Admitting: Family Medicine

## 2023-07-23 ENCOUNTER — Encounter (INDEPENDENT_AMBULATORY_CARE_PROVIDER_SITE_OTHER): Payer: Self-pay | Admitting: Family Medicine

## 2023-07-23 ENCOUNTER — Ambulatory Visit (INDEPENDENT_AMBULATORY_CARE_PROVIDER_SITE_OTHER): Payer: PPO | Admitting: Family Medicine

## 2023-07-23 VITALS — BP 110/50 | HR 94 | Temp 98.7°F | Ht 63.0 in | Wt 193.0 lb

## 2023-07-23 DIAGNOSIS — Z794 Long term (current) use of insulin: Secondary | ICD-10-CM | POA: Diagnosis not present

## 2023-07-23 DIAGNOSIS — Z6834 Body mass index (BMI) 34.0-34.9, adult: Secondary | ICD-10-CM

## 2023-07-23 DIAGNOSIS — Z7985 Long-term (current) use of injectable non-insulin antidiabetic drugs: Secondary | ICD-10-CM

## 2023-07-23 DIAGNOSIS — E669 Obesity, unspecified: Secondary | ICD-10-CM

## 2023-07-23 DIAGNOSIS — Z6836 Body mass index (BMI) 36.0-36.9, adult: Secondary | ICD-10-CM

## 2023-07-23 DIAGNOSIS — E1165 Type 2 diabetes mellitus with hyperglycemia: Secondary | ICD-10-CM

## 2023-07-23 DIAGNOSIS — E1159 Type 2 diabetes mellitus with other circulatory complications: Secondary | ICD-10-CM

## 2023-07-23 DIAGNOSIS — I152 Hypertension secondary to endocrine disorders: Secondary | ICD-10-CM | POA: Diagnosis not present

## 2023-07-23 MED ORDER — ONDANSETRON 4 MG PO TBDP
4.0000 mg | ORAL_TABLET | Freq: Four times a day (QID) | ORAL | 0 refills | Status: DC | PRN
Start: 2023-07-23 — End: 2024-06-25

## 2023-07-23 MED ORDER — TELMISARTAN-HCTZ 80-12.5 MG PO TABS
1.0000 | ORAL_TABLET | Freq: Every day | ORAL | 0 refills | Status: DC
Start: 2023-07-23 — End: 2023-09-05

## 2023-07-23 NOTE — Progress Notes (Signed)
Chief Complaint:   OBESITY Vanessa Higgins is here to discuss her progress with her obesity treatment plan along with follow-up of her obesity related diagnoses. Brittnea is on the Category 3 Plan with 8 oz of protein and states she is following her eating plan approximately 75% of the time. Darice states she is walking for 30 minutes 3 times per week.  Today's visit was #: 3 Starting weight: 199 lbs Starting date: 06/18/2023 Today's weight: 193 lbs Today's date: 07/23/2023 Total lbs lost to date: 6 Total lbs lost since last in-office visit: 5  Interim History: Patient started Mounjaro 2.5mg  and started feeling sick with nausea and vomiting and sulphur burps. She was up most of the night last night with diarrhea.  She is getting a good amount of the food in- she isn't getting all the snack calories in because she just isn't hungry.  She is wondering about the meal available at [employer].   Subjective:   1. Type 2 diabetes mellitus with hyperglycemia, with long-term current use of insulin (HCC) Patient has felt nauseous with vomiting and diarrhea after starting Mounjaro.  She was previously on Ozempic but she started at 0.5 mg.  Her fasting blood sugars range as low as 140.  2. Hypertension associated with diabetes (HCC) Patient denies dizziness or lightheadedness.  She is on Micardis HCT and diltiazem.  Assessment/Plan:   1. Type 2 diabetes mellitus with hyperglycemia, with long-term current use of insulin (HCC) Patient agreed to discontinue Mounjaro; and start Ozempic 0.25 mg (8 clicks).  Patient has pen at home; she agreed to start Zofran ODT 4 mg sublingual every 4-6 hours as needed for nausea and vomiting.  - ondansetron (ZOFRAN-ODT) 4 MG disintegrating tablet; Take 1 tablet (4 mg total) by mouth every 6 (six) hours as needed for nausea or vomiting.  Dispense: 20 tablet; Refill: 0  2. Hypertension associated with diabetes [facility]) Patient agreed to decrease Micardis from 80-25 mg to  80-12.5 mg daily, and we will refill for 1 month.  - telmisartan-hydrochlorothiazide (MICARDIS HCT) 80-12.5 MG tablet; Take 1 tablet by mouth daily.  Dispense: 30 tablet; Refill: 0  3. BMI 36.0-36.9,adult  4. Obesity with starting BMI of 36.3 Minta is currently in the action stage of change. As such, her goal is to continue with weight loss efforts. She has agreed to the Category 3 Plan.   Exercise goals: No exercise has been prescribed at this time.  Behavioral modification strategies: increasing lean protein intake, meal planning and cooking strategies, keeping healthy foods in the home, and planning for success.  Vanessa Higgins has agreed to follow-up with our clinic in 2 to 3 weeks. She was informed of the importance of frequent follow-up visits to maximize her success with intensive lifestyle modifications for her multiple health conditions.   Objective:   Blood pressure (!) 110/50, pulse 94, temperature 98.7 F (37.1 C), height 5\' 3"  (1.6 m), weight 193 lb (87.5 kg), SpO2 91%. Body mass index is 34.19 kg/m.  General: Cooperative, alert, well developed, in no acute distress. HEENT: Conjunctivae and lids unremarkable. Cardiovascular: Regular rhythm.  Lungs: Normal work of breathing. Neurologic: No focal deficits.   Lab Results  Component Value Date   CREATININE 0.80 06/25/2023   BUN 18 06/25/2023   NA 138 06/25/2023   K 4.0 06/25/2023   CL 99 06/25/2023   CO2 28 06/25/2023   Lab Results  Component Value Date   ALT 21 06/25/2023   AST 22 06/25/2023  ALKPHOS 45 06/25/2023   BILITOT 1.0 06/25/2023   Lab Results  Component Value Date   HGBA1C 9.0 (A) 06/16/2023   HGBA1C 8.5 (A) 03/14/2023   HGBA1C 8.4 (H) 10/28/2022   HGBA1C 6.4 (A) 07/10/2022   HGBA1C 6.8 (H) 03/12/2022   No results found for: "INSULIN" Lab Results  Component Value Date   TSH 0.946 06/25/2023   Lab Results  Component Value Date   CHOL 176 06/25/2023   HDL 56 06/25/2023   LDLCALC 95 06/25/2023    TRIG 126 06/25/2023   CHOLHDL 3.1 06/25/2023   Lab Results  Component Value Date   VD25OH 41.01 06/25/2023   VD25OH 52.55 10/28/2022   VD25OH 71.97 08/15/2021   Lab Results  Component Value Date   WBC 6.9 03/24/2023   HGB 13.5 03/24/2023   HCT 42.8 03/24/2023   MCV 95.5 03/24/2023   PLT 253 03/24/2023   Lab Results  Component Value Date   IRON 57 06/25/2023   TIBC 475 (H) 06/25/2023   FERRITIN 9 (L) 06/25/2023   Attestation Statements:   Reviewed by clinician on day of visit: allergies, medications, problem list, medical history, surgical history, family history, social history, and previous encounter notes.   I, Burt Knack, am acting as transcriptionist for Reuben Likes, MD.  I have reviewed the above documentation for accuracy and completeness, and I agree with the above. - Reuben Likes, MD

## 2023-07-30 ENCOUNTER — Other Ambulatory Visit: Payer: Self-pay | Admitting: Family

## 2023-07-30 DIAGNOSIS — Z794 Long term (current) use of insulin: Secondary | ICD-10-CM

## 2023-07-30 DIAGNOSIS — J45909 Unspecified asthma, uncomplicated: Secondary | ICD-10-CM

## 2023-08-05 ENCOUNTER — Encounter (INDEPENDENT_AMBULATORY_CARE_PROVIDER_SITE_OTHER): Payer: Self-pay | Admitting: Family Medicine

## 2023-08-05 ENCOUNTER — Ambulatory Visit (INDEPENDENT_AMBULATORY_CARE_PROVIDER_SITE_OTHER): Payer: PPO | Admitting: Family Medicine

## 2023-08-05 VITALS — BP 95/58 | HR 81 | Temp 98.3°F | Ht 63.0 in | Wt 195.0 lb

## 2023-08-05 DIAGNOSIS — E1165 Type 2 diabetes mellitus with hyperglycemia: Secondary | ICD-10-CM

## 2023-08-05 DIAGNOSIS — I152 Hypertension secondary to endocrine disorders: Secondary | ICD-10-CM | POA: Diagnosis not present

## 2023-08-05 DIAGNOSIS — Z794 Long term (current) use of insulin: Secondary | ICD-10-CM | POA: Diagnosis not present

## 2023-08-05 DIAGNOSIS — Z7985 Long-term (current) use of injectable non-insulin antidiabetic drugs: Secondary | ICD-10-CM

## 2023-08-05 DIAGNOSIS — E669 Obesity, unspecified: Secondary | ICD-10-CM | POA: Diagnosis not present

## 2023-08-05 DIAGNOSIS — Z6834 Body mass index (BMI) 34.0-34.9, adult: Secondary | ICD-10-CM | POA: Diagnosis not present

## 2023-08-05 DIAGNOSIS — E1159 Type 2 diabetes mellitus with other circulatory complications: Secondary | ICD-10-CM | POA: Diagnosis not present

## 2023-08-05 NOTE — Progress Notes (Signed)
Chief Complaint:   OBESITY Vanessa Higgins is here to discuss her progress with her obesity treatment plan along with follow-up of her obesity related diagnoses. Vanessa Higgins is on the Category 3 Plan and states she is following her eating plan approximately 50% of the time. Vanessa Higgins states she is walking for 30 minutes 3 times per week.  Today's visit was #: 4 Starting weight: 199 lbs Starting date: 06/18/2023 Today's weight: 195 lbs Today's date: 08/05/2023 Total lbs lost to date: 4 Total lbs lost since last in-office visit: 0  Interim History: Patient started at a low dose Ozempic at last appointment after feeling ill on Mounjaro.  [place] her recent birthday.  No upcoming plans for travel or events.   Subjective:   1. Hypertension associated with diabetes (HCC) Patient's blood pressure is borderline low today.  She denies chest pain, chest pressure, lightheadedness, or dizziness.  She is on Micardis 80-12.5 mg and Cardizem.  2. Type 2 diabetes mellitus with hyperglycemia, with long-term current use of insulin (HCC) Patient's fasting blood sugars range between 120s-150.  She has been inconsistently on her plan.  She has not received her Dexcom yet.  She is on one half dose of Ozempic.  Assessment/Plan:   1. Hypertension associated with diabetes (HCC) We will follow-up on patient's blood pressure at her next appointment; may need to decrease to 40-12.5 mg at her next appointment if her blood pressure stays this low.  2. Type 2 diabetes mellitus with hyperglycemia, with long-term current use of insulin (HCC) We will follow-up at patient's next appointment.  3. BMI 34.0-34.9,adult  4. Obesity with starting BMI of 36.3 Vanessa Higgins is currently in the action stage of change. As such, her goal is to continue with weight loss efforts. She has agreed to the Category 3 Plan.   Exercise goals: All adults should avoid inactivity. Some physical activity is better than none,  and adults who participate in any amount of physical activity gain some health benefits.  Behavioral modification strategies: meal planning and cooking strategies, keeping healthy foods in the home, and planning for success.  Vanessa Higgins has agreed to follow-up with our clinic in 2 to 3 weeks. She was informed of the importance of frequent follow-up visits to maximize her success with intensive lifestyle modifications for her multiple health conditions.   Objective:   Blood pressure (!) 95/58, pulse 81, temperature 98.3 F (36.8 C), height 5\' 3"  (1.6 m), weight 195 lb (88.5 kg), SpO2 94%. Body mass index is 34.54 kg/m.  General: Cooperative, alert, well developed, in no acute distress. HEENT: Conjunctivae and lids unremarkable. Cardiovascular: Regular rhythm.  Lungs: Normal work of breathing. Neurologic: No focal deficits.   Lab Results  Component Value Date   CREATININE 0.80 06/25/2023   BUN 18 06/25/2023   NA 138 06/25/2023   K 4.0 06/25/2023   CL 99 06/25/2023   CO2 28 06/25/2023   Lab Results  Component Value Date   ALT 21 06/25/2023   AST 22 06/25/2023   ALKPHOS 45 06/25/2023   BILITOT 1.0 06/25/2023   Lab Results  Component Value Date   HGBA1C 9.0 (A) 06/16/2023   HGBA1C 8.5 (A) 03/14/2023   HGBA1C 8.4 (H) 10/28/2022   HGBA1C 6.4 (A) 07/10/2022   HGBA1C 6.8 (H) 03/12/2022   No results found for: "INSULIN" Lab Results  Component Value Date   TSH 0.946 06/25/2023   Lab Results  Component Value Date   CHOL  176 06/25/2023   HDL 56 06/25/2023   LDLCALC 95 06/25/2023   TRIG 126 06/25/2023   CHOLHDL 3.1 06/25/2023   Lab Results  Component Value Date   VD25OH 41.01 06/25/2023   VD25OH 52.55 10/28/2022   VD25OH 71.97 08/15/2021   Lab Results  Component Value Date   WBC 6.9 03/24/2023   HGB 13.5 03/24/2023   HCT 42.8 03/24/2023   MCV 95.5 03/24/2023   PLT 253 03/24/2023   Lab Results  Component Value Date   IRON 57 06/25/2023   TIBC 475 (H) 06/25/2023    FERRITIN 9 (L) 06/25/2023   Attestation Statements:   Reviewed by clinician on day of visit: allergies, medications, problem list, medical history, surgical history, family history, social history, and previous encounter notes.   I, Burt Knack, am acting as transcriptionist for Reuben Likes, MD.  I have reviewed the above documentation for accuracy and completeness, and I agree with the above. - Reuben Likes, MD

## 2023-08-07 ENCOUNTER — Encounter: Payer: Self-pay | Admitting: Neurosurgery

## 2023-08-11 ENCOUNTER — Other Ambulatory Visit: Payer: Self-pay | Admitting: Neurosurgery

## 2023-08-11 DIAGNOSIS — I671 Cerebral aneurysm, nonruptured: Secondary | ICD-10-CM

## 2023-08-12 ENCOUNTER — Telehealth (INDEPENDENT_AMBULATORY_CARE_PROVIDER_SITE_OTHER): Payer: PPO | Admitting: Psychology

## 2023-08-14 ENCOUNTER — Ambulatory Visit: Payer: PPO

## 2023-08-14 ENCOUNTER — Ambulatory Visit
Admission: RE | Admit: 2023-08-14 | Discharge: 2023-08-14 | Disposition: A | Discharge status: Home / Self Care | Payer: PPO | Source: Ambulatory Visit | Attending: Neurosurgery | Admitting: Neurosurgery

## 2023-08-14 ENCOUNTER — Encounter (INDEPENDENT_AMBULATORY_CARE_PROVIDER_SITE_OTHER): Payer: Self-pay

## 2023-08-14 DIAGNOSIS — I671 Cerebral aneurysm, nonruptured: Secondary | ICD-10-CM | POA: Diagnosis not present

## 2023-08-18 ENCOUNTER — Other Ambulatory Visit (INDEPENDENT_AMBULATORY_CARE_PROVIDER_SITE_OTHER): Payer: Self-pay | Admitting: Family Medicine

## 2023-08-18 DIAGNOSIS — E1159 Type 2 diabetes mellitus with other circulatory complications: Secondary | ICD-10-CM

## 2023-08-22 ENCOUNTER — Other Ambulatory Visit (INDEPENDENT_AMBULATORY_CARE_PROVIDER_SITE_OTHER): Payer: Self-pay | Admitting: Family Medicine

## 2023-08-22 DIAGNOSIS — E1159 Type 2 diabetes mellitus with other circulatory complications: Secondary | ICD-10-CM

## 2023-08-26 ENCOUNTER — Ambulatory Visit (INDEPENDENT_AMBULATORY_CARE_PROVIDER_SITE_OTHER): Payer: PPO | Admitting: Family Medicine

## 2023-08-26 ENCOUNTER — Encounter (INDEPENDENT_AMBULATORY_CARE_PROVIDER_SITE_OTHER): Payer: Self-pay | Admitting: Family Medicine

## 2023-08-26 VITALS — BP 111/62 | HR 83 | Temp 98.5°F | Ht 63.0 in | Wt 193.0 lb

## 2023-08-26 DIAGNOSIS — E785 Hyperlipidemia, unspecified: Secondary | ICD-10-CM | POA: Diagnosis not present

## 2023-08-26 DIAGNOSIS — Z6834 Body mass index (BMI) 34.0-34.9, adult: Secondary | ICD-10-CM

## 2023-08-26 DIAGNOSIS — Z7984 Long term (current) use of oral hypoglycemic drugs: Secondary | ICD-10-CM | POA: Diagnosis not present

## 2023-08-26 DIAGNOSIS — E1169 Type 2 diabetes mellitus with other specified complication: Secondary | ICD-10-CM | POA: Diagnosis not present

## 2023-08-26 DIAGNOSIS — Z794 Long term (current) use of insulin: Secondary | ICD-10-CM | POA: Diagnosis not present

## 2023-08-26 DIAGNOSIS — E669 Obesity, unspecified: Secondary | ICD-10-CM

## 2023-08-26 DIAGNOSIS — E1165 Type 2 diabetes mellitus with hyperglycemia: Secondary | ICD-10-CM | POA: Diagnosis not present

## 2023-08-26 DIAGNOSIS — Z7985 Long-term (current) use of injectable non-insulin antidiabetic drugs: Secondary | ICD-10-CM | POA: Diagnosis not present

## 2023-08-26 NOTE — Progress Notes (Signed)
Chief Complaint:   OBESITY Vanessa Higgins is here to discuss her progress with her obesity treatment plan along with follow-up of her obesity related diagnoses. Vanessa Higgins is on the Category 3 Plan and states she is following her eating plan approximately 75-80% of the time. Vanessa Higgins states she is walking for 20 minutes 3 times per week.  Today's visit was #: 5 Starting weight: 199 lbs Starting date: 06/18/2023 Today's weight: 193 lbs Today's date: 08/26/2023 Total lbs lost to date: 6 Total lbs lost since last in-office visit: 2  Interim History: Patient voices that her biggest obstacle is eating out frequently.  She is invited out by friends frequently do to being widowed. She has been working hard to getting all her food in and all her snacks in.  She realizes she isn't as hungry and thinks this is due to ozempic.  She is noticing nonscale victories.  Subjective:   1. Type 2 diabetes mellitus with hyperglycemia, with long-term current use of insulin (HCC) Patient's fasting blood sugars range between 106-114 over the last 10 days.  (Significant improvement from initial labs).  She is on Lantus 20 units, Farxiga, and Ozempic.  Her highest postprandial sugar is 168.  2. Hyperlipidemia associated with type 2 diabetes mellitus (HCC) Patient is on Lipitor daily, and her last LDL was 95, HDL 56, and triglycerides 419.  She denies myalgias or transaminitis.  Assessment/Plan:   1. Type 2 diabetes mellitus with hyperglycemia, with long-term current use of insulin (HCC) Patient was instructed to decrease Lantus to 4 units if her fasting blood sugars run below 120 for 3 consecutive days; will continue at 16 units daily until fasting blood sugars are under 120 for 3 consecutive days, then decrease another 4 units.  2. Hyperlipidemia associated with type 2 diabetes mellitus (HCC) Patient will continue Lipitor with no change in dose.  3. BMI 34.0-34.9,adult  4. Obesity with starting BMI of 36.3 Itzelle is  currently in the action stage of change. As such, her goal is to continue with weight loss efforts. She has agreed to the Category 3 Plan.   Exercise goals: All adults should avoid inactivity. Some physical activity is better than none, and adults who participate in any amount of physical activity gain some health benefits.  Behavioral modification strategies: increasing lean protein intake, meal planning and cooking strategies, keeping healthy foods in the home, and planning for success.  Pepper has agreed to follow-up with our clinic in 4 weeks. She was informed of the importance of frequent follow-up visits to maximize her success with intensive lifestyle modifications for her multiple health conditions.   Objective:   Blood pressure 111/62, pulse 83, temperature 98.5 F (36.9 C), height 5\' 3"  (1.6 m), weight 193 lb (87.5 kg), SpO2 94%. Body mass index is 34.19 kg/m.  General: Cooperative, alert, well developed, in no acute distress. HEENT: Conjunctivae and lids unremarkable. Cardiovascular: Regular rhythm.  Lungs: Normal work of breathing. Neurologic: No focal deficits.   Lab Results  Component Value Date   CREATININE 0.80 06/25/2023   BUN 18 06/25/2023   NA 138 06/25/2023   K 4.0 06/25/2023   CL 99 06/25/2023   CO2 28 06/25/2023   Lab Results  Component Value Date   ALT 21 06/25/2023   AST 22 06/25/2023   ALKPHOS 45 06/25/2023   BILITOT 1.0 06/25/2023   Lab Results  Component Value Date   HGBA1C 9.0 (A) 06/16/2023   HGBA1C 8.5 (A) 03/14/2023   HGBA1C 8.4 (  H) 10/28/2022   HGBA1C 6.4 (A) 07/10/2022   HGBA1C 6.8 (H) 03/12/2022   No results found for: "INSULIN" Lab Results  Component Value Date   TSH 0.946 06/25/2023   Lab Results  Component Value Date   CHOL 176 06/25/2023   HDL 56 06/25/2023   LDLCALC 95 06/25/2023   TRIG 126 06/25/2023   CHOLHDL 3.1 06/25/2023   Lab Results  Component Value Date   VD25OH 41.01 06/25/2023   VD25OH 52.55 10/28/2022    VD25OH 71.97 08/15/2021   Lab Results  Component Value Date   WBC 6.9 03/24/2023   HGB 13.5 03/24/2023   HCT 42.8 03/24/2023   MCV 95.5 03/24/2023   PLT 253 03/24/2023   Lab Results  Component Value Date   IRON 57 06/25/2023   TIBC 475 (H) 06/25/2023   FERRITIN 9 (L) 06/25/2023   Attestation Statements:   Reviewed by clinician on day of visit: allergies, medications, problem list, medical history, surgical history, family history, social history, and previous encounter notes.   I, Burt Knack, am acting as transcriptionist for Reuben Likes, MD.  I have reviewed the above documentation for accuracy and completeness, and I agree with the above. - Reuben Likes, MD

## 2023-09-03 ENCOUNTER — Other Ambulatory Visit (INDEPENDENT_AMBULATORY_CARE_PROVIDER_SITE_OTHER): Payer: Self-pay | Admitting: Family Medicine

## 2023-09-03 DIAGNOSIS — E1159 Type 2 diabetes mellitus with other circulatory complications: Secondary | ICD-10-CM

## 2023-09-05 ENCOUNTER — Telehealth: Payer: Self-pay | Admitting: Family

## 2023-09-05 DIAGNOSIS — E1159 Type 2 diabetes mellitus with other circulatory complications: Secondary | ICD-10-CM

## 2023-09-05 MED ORDER — TELMISARTAN-HCTZ 80-12.5 MG PO TABS
1.0000 | ORAL_TABLET | Freq: Every day | ORAL | 1 refills | Status: AC
Start: 2023-09-05 — End: ?

## 2023-09-05 NOTE — Telephone Encounter (Signed)
Prescription Request  09/05/2023  LOV: 06/16/2023  What is the name of the medication or equipment?  telmisartan-hydrochlorothiazide (MICARDIS HCT) 80-12.5 MG table    Have you contacted your pharmacy to request a refill? No   Which pharmacy would you like this sent to?  [id number] [place], [place] - [address] AT [facility] [address] [place] [address] Phone: [phone] Fax: [phone]   Patient notified that their request is being sent to the clinical staff for review and that they should receive a response within 2 business days.   Please advise at [facility] [phone]

## 2023-09-05 NOTE — Telephone Encounter (Signed)
Spoke to pt and informed her that refill was sent in to the pharmacy they will contact her when it is ready for pickup. Pt verbalized understanding

## 2023-09-08 DIAGNOSIS — I671 Cerebral aneurysm, nonruptured: Secondary | ICD-10-CM | POA: Diagnosis not present

## 2023-09-08 DIAGNOSIS — Z6833 Body mass index (BMI) 33.0-33.9, adult: Secondary | ICD-10-CM | POA: Diagnosis not present

## 2023-09-12 ENCOUNTER — Other Ambulatory Visit: Payer: Self-pay

## 2023-09-12 MED ORDER — COVID-19 MRNA VAC-TRIS(PFIZER) 30 MCG/0.3ML IM SUSY
0.3000 mL | PREFILLED_SYRINGE | Freq: Once | INTRAMUSCULAR | 0 refills | Status: AC
  Filled 2023-09-12: qty 0.3, 1d supply, fill #0

## 2023-09-23 ENCOUNTER — Telehealth (INDEPENDENT_AMBULATORY_CARE_PROVIDER_SITE_OTHER): Payer: Self-pay | Admitting: Family Medicine

## 2023-09-23 DIAGNOSIS — L821 Other seborrheic keratosis: Secondary | ICD-10-CM | POA: Diagnosis not present

## 2023-09-23 NOTE — Telephone Encounter (Signed)
I received a phone call today from a representative with [facility] requesting a CMN and progress notes for this patient's Dex Com meter and supplies. Call back number is [phone] Fax is [phone]   Thank you

## 2023-09-24 ENCOUNTER — Ambulatory Visit: Payer: PPO | Admitting: Family

## 2023-09-24 ENCOUNTER — Other Ambulatory Visit: Payer: Self-pay

## 2023-09-24 NOTE — Telephone Encounter (Signed)
Spoke w/ pt. Asked questions on order-CS

## 2023-09-26 ENCOUNTER — Ambulatory Visit: Payer: PPO | Admitting: Family

## 2023-09-28 ENCOUNTER — Other Ambulatory Visit: Payer: Self-pay | Admitting: Family

## 2023-09-28 DIAGNOSIS — I152 Hypertension secondary to endocrine disorders: Secondary | ICD-10-CM

## 2023-09-30 ENCOUNTER — Ambulatory Visit (INDEPENDENT_AMBULATORY_CARE_PROVIDER_SITE_OTHER): Payer: PPO | Admitting: Family Medicine

## 2023-10-06 DIAGNOSIS — E119 Type 2 diabetes mellitus without complications: Secondary | ICD-10-CM | POA: Diagnosis not present

## 2023-10-06 DIAGNOSIS — Z961 Presence of intraocular lens: Secondary | ICD-10-CM | POA: Diagnosis not present

## 2023-10-07 ENCOUNTER — Encounter: Payer: Self-pay | Admitting: Family

## 2023-10-07 ENCOUNTER — Ambulatory Visit (INDEPENDENT_AMBULATORY_CARE_PROVIDER_SITE_OTHER): Payer: PPO | Admitting: Family

## 2023-10-07 VITALS — BP 120/76 | HR 67 | Temp 97.9°F | Ht 63.0 in | Wt 190.1 lb

## 2023-10-07 DIAGNOSIS — I10 Essential (primary) hypertension: Secondary | ICD-10-CM | POA: Diagnosis not present

## 2023-10-07 DIAGNOSIS — Z794 Long term (current) use of insulin: Secondary | ICD-10-CM

## 2023-10-07 DIAGNOSIS — E1165 Type 2 diabetes mellitus with hyperglycemia: Secondary | ICD-10-CM

## 2023-10-07 LAB — POCT GLYCOSYLATED HEMOGLOBIN (HGB A1C): Hemoglobin A1C: 6.9 % — AB (ref 4.0–5.6)

## 2023-10-07 NOTE — Assessment & Plan Note (Signed)
Chronic, stable.  Continue diltiazem 240 mg, telmisartan-hydrochlorothiazide 80-25 mg 

## 2023-10-07 NOTE — Progress Notes (Signed)
Assessment & Plan:  Type 2 diabetes mellitus with hyperglycemia, with long-term current use of insulin (HCC) Assessment & Plan: Lab Results  Component Value Date   HGBA1C 6.9 (A) 10/07/2023   Significant improvement.  Congratulated patient on hard work.  She has retried Ozempic, 8 clicks, as advised by Dr Lawson Radar and tolerating.  Reiterated the importance of decreasing Lantus 20 units every day by 4 units if she has 3 consecutive days and fasting blood glucose less than 130 to avoid hypoglycemia.  Continue Farxiga 10 mg daily, metformin 1000 mg twice daily.  Orders: -     POCT glycosylated hemoglobin (Hb A1C) -     Microalbumin / creatinine urine ratio  Essential hypertension Assessment & Plan: Chronic, stable.  Continue diltiazem 240 mg, telmisartan-hydrochlorothiazide 80-25 mg      Return precautions given.   Risks, benefits, and alternatives of the medications and treatment plan prescribed today were discussed, and patient expressed understanding.   Education regarding symptom management and diagnosis given to patient on AVS either electronically or printed.  No follow-ups on file.  Rennie Plowman, FNP  Subjective:    Patient ID: Vanessa Higgins, female    DOB: 12-14-1951, 72 y.o.   MRN: [id number]  CC: Vanessa Higgins is a 72 y.o. female who presents today for follow up.   HPI: Feels well today No new complaints.    Continues to follow with him healthy weight and wellness.  Decrease Lantus to 4 units if her fasting blood sugars run below 120 for 3 consecutive days.  Compliant with Lantus 20 units daily, Farxiga 10mg ,metformin 1000mg  BID   Dr Lawson Radar advised retrial of Ozempic, giving 8 clicks once per week without nausea, constipation.   Mounjaro caused nausea.   Tdap 08/22/21   Allergies: Sulfa antibiotics, Lisinopril, Other, Ozempic (0.25 or 0.5 mg-dose) [semaglutide(0.25 or 0.5mg -dos)], and Ramipril Current Outpatient Medications on File Prior to Visit   Medication Sig Dispense Refill   albuterol (VENTOLIN HFA) 108 (90 Base) MCG/ACT inhaler TAKE 2 PUFFS BY MOUTH EVERY 6 HOURS AS NEEDED FOR WHEEZE OR SHORTNESS OF BREATH 18 each 2   aspirin EC 81 MG tablet Take 1 tablet (81 mg total) by mouth daily. Swallow whole. 90 tablet 3   atorvastatin (LIPITOR) 20 MG tablet TAKE 1 TABLET BY MOUTH EVERYDAY AT BEDTIME 90 tablet 1   Budeson-Glycopyrrol-Formoterol (BREZTRI AEROSPHERE) 160-9-4.8 MCG/ACT AERO Inhale 2 puffs into the lungs in the morning and at bedtime. 32.1 g 3   Cholecalciferol (VITAMIN D) 125 MCG (5000 UT) CAPS Take 5,000 Units by mouth every Monday,Wednesday,Friday, and Sunday at 6 PM AND 10,000 Units every Tuesday, Thursday, and Saturday at 6 PM.     Continuous Glucose Monitor Sup KIT 1 Units by Does not apply route daily. 1 kit 0   Continuous Glucose Sensor (DEXCOM G6 SENSOR) MISC 1 Units by Does not apply route every 14 (fourteen) days. 2 each 0   Continuous Glucose Transmitter (DEXCOM G6 TRANSMITTER) MISC 1 Units by Does not apply route every 30 (thirty) days. 1 each 0   dapagliflozin propanediol (FARXIGA) 10 MG TABS tablet Take 1 tablet (10 mg total) by mouth daily before breakfast. 90 tablet 3   diltiazem (CARDIZEM CD) 240 MG 24 hr capsule TAKE 1 CAPSULE BY MOUTH EVERY DAY 90 capsule 2   diphenoxylate-atropine (LOMOTIL) 2.5-0.025 MG tablet Take 1 tablet by mouth every 6 (six) hours as needed for diarrhea or loose stools. 30 tablet 1   insulin glargine (LANTUS SOLOSTAR)  100 UNIT/ML Solostar Pen INJECT 6 UNITS INTO THE SKIN EVERY MORNING. (Patient taking differently: Inject 20 Units into the skin every morning.) 15 mL 1   Insulin Pen Needle 31G X 6 MM MISC Use one pen needle daily. 90 each 3   loratadine (CLARITIN) 10 MG tablet Take 10 mg by mouth at bedtime.      metFORMIN (GLUCOPHAGE-XR) 500 MG 24 hr tablet TAKE 2 TABLETS BY MOUTH TWICE A DAY 360 tablet 1   montelukast (SINGULAIR) 10 MG tablet TAKE 1 TABLET BY MOUTH EVERYDAY AT BEDTIME 90  tablet 1   ondansetron (ZOFRAN-ODT) 4 MG disintegrating tablet Take 1 tablet (4 mg total) by mouth every 6 (six) hours as needed for nausea or vomiting. 20 tablet 0   ONETOUCH VERIO test strip USE 1 EACH BY OTHER ROUTE AS NEEDED FOR OTHER. FREESTYLE ELITE 100 strip 3   pantoprazole (PROTONIX) 20 MG tablet Take 1 tablet (20 mg total) by mouth every morning. Take 30 minutes to hour before breakfast 90 tablet 3   Probiotic Product (PROBIOTIC PO) Take 1 capsule by mouth daily.     Semaglutide,0.25 or 0.5MG /DOS, (OZEMPIC, 0.25 OR 0.5 MG/DOSE,) 2 MG/1.5ML SOPN Inject 0.11 mg into the skin once a week. 8 clicks     telmisartan-hydrochlorothiazide (MICARDIS HCT) 80-12.5 MG tablet TAKE 1 TABLET BY MOUTH EVERY DAY 90 tablet 1   venlafaxine XR (EFFEXOR-XR) 150 MG 24 hr capsule TAKE 1 CAPSULE BY MOUTH DAILY WITH BREAKFAST. 90 capsule 1   vitamin B-12 (CYANOCOBALAMIN) 1000 MCG tablet Take 1,000 mcg by mouth at bedtime.     No current facility-administered medications on file prior to visit.    Review of Systems  Constitutional:  Negative for chills and fever.  Respiratory:  Negative for cough.   Cardiovascular:  Negative for chest pain and palpitations.  Gastrointestinal:  Negative for nausea and vomiting.      Objective:    BP 120/76   Pulse 67   Temp 97.9 F (36.6 C) (Oral)   Ht 5\' 3"  (1.6 m)   Wt 190 lb 1.6 oz (86.2 kg)   SpO2 97%   BMI 33.67 kg/m  BP Readings from Last 3 Encounters:  10/07/23 120/76  08/26/23 111/62  08/05/23 (!) 95/58   Wt Readings from Last 3 Encounters:  10/07/23 190 lb 1.6 oz (86.2 kg)  08/26/23 193 lb (87.5 kg)  08/05/23 195 lb (88.5 kg)    Physical Exam Vitals reviewed.  Constitutional:      Appearance: She is well-developed.  Eyes:     Conjunctiva/sclera: Conjunctivae normal.  Cardiovascular:     Rate and Rhythm: Normal rate and regular rhythm.     Pulses: Normal pulses.     Heart sounds: Normal heart sounds.  Pulmonary:     Effort: Pulmonary effort  is normal.     Breath sounds: Normal breath sounds. No wheezing, rhonchi or rales.  Skin:    General: Skin is warm and dry.  Neurological:     Mental Status: She is alert.  Psychiatric:        Speech: Speech normal.        Behavior: Behavior normal.        Thought Content: Thought content normal.

## 2023-10-07 NOTE — Assessment & Plan Note (Signed)
Lab Results  Component Value Date   HGBA1C 6.9 (A) 10/07/2023   Significant improvement.  Congratulated patient on hard work.  She has retried Ozempic, 8 clicks, as advised by Dr Lawson Radar and tolerating.  Reiterated the importance of decreasing Lantus 20 units every day by 4 units if she has 3 consecutive days and fasting blood glucose less than 130 to avoid hypoglycemia.  Continue Farxiga 10 mg daily, metformin 1000 mg twice daily.

## 2023-10-08 ENCOUNTER — Ambulatory Visit (INDEPENDENT_AMBULATORY_CARE_PROVIDER_SITE_OTHER): Payer: PPO | Admitting: Family Medicine

## 2023-10-08 ENCOUNTER — Encounter (INDEPENDENT_AMBULATORY_CARE_PROVIDER_SITE_OTHER): Payer: Self-pay | Admitting: Family Medicine

## 2023-10-08 VITALS — BP 102/61 | HR 75 | Temp 97.7°F | Ht 63.0 in | Wt 189.0 lb

## 2023-10-08 DIAGNOSIS — I152 Hypertension secondary to endocrine disorders: Secondary | ICD-10-CM

## 2023-10-08 DIAGNOSIS — E669 Obesity, unspecified: Secondary | ICD-10-CM | POA: Diagnosis not present

## 2023-10-08 DIAGNOSIS — E1159 Type 2 diabetes mellitus with other circulatory complications: Secondary | ICD-10-CM | POA: Diagnosis not present

## 2023-10-08 DIAGNOSIS — Z794 Long term (current) use of insulin: Secondary | ICD-10-CM | POA: Diagnosis not present

## 2023-10-08 DIAGNOSIS — Z6834 Body mass index (BMI) 34.0-34.9, adult: Secondary | ICD-10-CM

## 2023-10-08 DIAGNOSIS — Z6833 Body mass index (BMI) 33.0-33.9, adult: Secondary | ICD-10-CM | POA: Diagnosis not present

## 2023-10-08 DIAGNOSIS — E1165 Type 2 diabetes mellitus with hyperglycemia: Secondary | ICD-10-CM

## 2023-10-08 LAB — MICROALBUMIN / CREATININE URINE RATIO
Creatinine,U: 82.1 mg/dL
Microalb Creat Ratio: 1.5 mg/g (ref 0.0–30.0)
Microalb, Ur: 1.2 mg/dL (ref 0.0–1.9)

## 2023-10-08 NOTE — Progress Notes (Signed)
Chief Complaint:   OBESITY Vanessa Higgins is here to discuss her progress with her obesity treatment plan along with follow-up of her obesity related diagnoses. Vanessa Higgins is on the Category 3 Plan and states she is following her eating plan approximately 60% of the time. Vanessa Higgins states she is doing yard work, and walking for 30 minutes 1-3 times per week.  Today's visit was #: 6 Starting weight: 199 lbs Starting date: 06/18/2023 Today's weight: 189 lbs Today's date: 10/08/2023 Total lbs lost to date: 10 Total lbs lost since last in-office visit: 4  Interim History: Patient has been trying to stay on the meal plan as much as she can.  She has had more stress recently with concern for her grandson who is working as a Copywriter, advertising. This is a change for her because she is now living alone. She is working toward getting all the food in.  She is getting more protein in. She is now able to get vegetables and some meat in for supper. She is doing some string cheese or peanut butter to help increase total intake.  Subjective:   1. Type 2 diabetes mellitus with hyperglycemia, with long-term current use of insulin (HCC) Patient's blood sugars range between 130-140s.  She is still taking 20 units of Lantus.  Her A1c is 6.9 from 9.0 in 3 months.  Dexcom pending.  2. Hypertension associated with diabetes (HCC) Patient's blood pressure is very well-controlled.  She denies chest pain, chest pressure, or headache.  Assessment/Plan:   1. Type 2 diabetes mellitus with hyperglycemia, with long-term current use of insulin (HCC) Patient will continue her current medications; she is to titrate Lantus as tolerated pending fasting blood sugars.  2. Hypertension associated with diabetes [facility]) Patient will continue her current medications with no change in meds or doses.  She may be able to titrate down depending subsequent blood pressure.  3. BMI 33.0-33.9,adult  4. Obesity with starting BMI of 36.3 Vanessa Higgins is currently in  the action stage of change. As such, her goal is to continue with weight loss efforts. She has agreed to the Category 3 Plan.   Exercise goals: All adults should avoid inactivity. Some physical activity is better than none, and adults who participate in any amount of physical activity gain some health benefits.  Behavioral modification strategies: increasing lean protein intake, meal planning and cooking strategies, keeping healthy foods in the home, and planning for success.  Vanessa Higgins has agreed to follow-up with our clinic in 3 weeks. She was informed of the importance of frequent follow-up visits to maximize her success with intensive lifestyle modifications for her multiple health conditions.   Objective:   Blood pressure 102/61, pulse 75, temperature 97.7 F (36.5 C), height 5\' 3"  (1.6 m), weight 189 lb (85.7 kg), SpO2 95%. Body mass index is 33.48 kg/m.  General: Cooperative, alert, well developed, in no acute distress. HEENT: Conjunctivae and lids unremarkable. Cardiovascular: Regular rhythm.  Lungs: Normal work of breathing. Neurologic: No focal deficits.   Lab Results  Component Value Date   CREATININE 0.80 06/25/2023   BUN 18 06/25/2023   NA 138 06/25/2023   K 4.0 06/25/2023   CL 99 06/25/2023   CO2 28 06/25/2023   Lab Results  Component Value Date   ALT 21 06/25/2023   AST 22 06/25/2023   ALKPHOS 45 06/25/2023   BILITOT 1.0 06/25/2023   Lab Results  Component Value Date   HGBA1C 6.9 (A) 10/07/2023   HGBA1C 9.0 (A) 06/16/2023  HGBA1C 8.5 (A) 03/14/2023   HGBA1C 8.4 (H) 10/28/2022   HGBA1C 6.4 (A) 07/10/2022   No results found for: "INSULIN" Lab Results  Component Value Date   TSH 0.946 06/25/2023   Lab Results  Component Value Date   CHOL 176 06/25/2023   HDL 56 06/25/2023   LDLCALC 95 06/25/2023   TRIG 126 06/25/2023   CHOLHDL 3.1 06/25/2023   Lab Results  Component Value Date   VD25OH 41.01 06/25/2023   VD25OH 52.55 10/28/2022   VD25OH 71.97  08/15/2021   Lab Results  Component Value Date   WBC 6.9 03/24/2023   HGB 13.5 03/24/2023   HCT 42.8 03/24/2023   MCV 95.5 03/24/2023   PLT 253 03/24/2023   Lab Results  Component Value Date   IRON 57 06/25/2023   TIBC 475 (H) 06/25/2023   FERRITIN 9 (L) 06/25/2023   Attestation Statements:   Reviewed by clinician on day of visit: allergies, medications, problem list, medical history, surgical history, family history, social history, and previous encounter notes.   I, Burt Knack, am acting as transcriptionist for Reuben Likes, MD.  I have reviewed the above documentation for accuracy and completeness, and I agree with the above. - Reuben Likes, MD

## 2023-10-12 ENCOUNTER — Other Ambulatory Visit: Payer: Self-pay | Admitting: Family

## 2023-10-12 DIAGNOSIS — Z794 Long term (current) use of insulin: Secondary | ICD-10-CM

## 2023-10-14 ENCOUNTER — Encounter: Payer: Self-pay | Admitting: Family

## 2023-10-21 ENCOUNTER — Ambulatory Visit
Admission: RE | Admit: 2023-10-21 | Discharge: 2023-10-21 | Disposition: A | Discharge status: Home / Self Care | Payer: PPO | Source: Ambulatory Visit | Attending: Family | Admitting: Family

## 2023-10-21 DIAGNOSIS — Z1231 Encounter for screening mammogram for malignant neoplasm of breast: Secondary | ICD-10-CM | POA: Insufficient documentation

## 2023-10-29 ENCOUNTER — Ambulatory Visit (INDEPENDENT_AMBULATORY_CARE_PROVIDER_SITE_OTHER): Payer: PPO | Admitting: Family Medicine

## 2023-11-05 ENCOUNTER — Encounter (INDEPENDENT_AMBULATORY_CARE_PROVIDER_SITE_OTHER): Payer: Self-pay

## 2023-11-05 ENCOUNTER — Ambulatory Visit (INDEPENDENT_AMBULATORY_CARE_PROVIDER_SITE_OTHER): Payer: PPO | Admitting: Family Medicine

## 2023-11-07 ENCOUNTER — Ambulatory Visit (INDEPENDENT_AMBULATORY_CARE_PROVIDER_SITE_OTHER): Payer: PPO | Admitting: Internal Medicine

## 2023-11-07 ENCOUNTER — Encounter: Payer: Self-pay | Admitting: Internal Medicine

## 2023-11-07 VITALS — BP 116/68 | HR 79 | Temp 98.0°F | Ht 63.0 in | Wt 190.2 lb

## 2023-11-07 DIAGNOSIS — R300 Dysuria: Secondary | ICD-10-CM

## 2023-11-07 DIAGNOSIS — B3749 Other urogenital candidiasis: Secondary | ICD-10-CM | POA: Diagnosis not present

## 2023-11-07 LAB — POCT URINALYSIS DIPSTICK
Bilirubin, UA: POSITIVE
Blood, UA: NEGATIVE
Glucose, UA: POSITIVE — AB
Ketones, UA: POSITIVE
Nitrite, UA: POSITIVE
Protein, UA: POSITIVE — AB
Spec Grav, UA: 1.01 (ref 1.010–1.025)
Urobilinogen, UA: 4 U/dL — AB
pH, UA: 5 (ref 5.0–8.0)

## 2023-11-07 MED ORDER — CIPROFLOXACIN HCL 250 MG PO TABS
250.0000 mg | ORAL_TABLET | Freq: Two times a day (BID) | ORAL | 0 refills | Status: AC
Start: 2023-11-07 — End: 2023-11-14

## 2023-11-07 NOTE — Patient Instructions (Signed)
Nice to meet you!  I have called in a 7 day supply of Cipro  . You may start it if your symptoms worsen before the culture is finalized   You can continue the azo if it eases the dysuria   Continue your probiotic

## 2023-11-07 NOTE — Progress Notes (Unsigned)
Subjective:  Patient ID: Vanessa Higgins, female    DOB: 15-Aug-1951  Age: 72 y.o. MRN: [id number]  CC: The primary encounter diagnosis was Dysuria. A diagnosis of Candiduria was also pertinent to this visit.   HPI Vanessa Higgins presents for  Chief Complaint  Patient presents with   Urinary Tract Infection    Since Sunday Patient has had frequency, fullness and discomfort   72 yr old female with type 2 DM presents with 5 day history of pelvic discomfort  and urinary frequency that started on Sunday afternoon .  She took Macrobid and is on Day 5  but still having burning and fullness .  Denies nausea, fevers,  flank and CVA pain.   Last veriifed UTI reviewed.  Outpatient Medications Prior to Visit  Medication Sig Dispense Refill   albuterol (VENTOLIN HFA) 108 (90 Base) MCG/ACT inhaler TAKE 2 PUFFS BY MOUTH EVERY 6 HOURS AS NEEDED FOR WHEEZE OR SHORTNESS OF BREATH 18 each 2   aspirin EC 81 MG tablet Take 1 tablet (81 mg total) by mouth daily. Swallow whole. 90 tablet 3   atorvastatin (LIPITOR) 20 MG tablet TAKE 1 TABLET BY MOUTH EVERYDAY AT BEDTIME 90 tablet 1   Budeson-Glycopyrrol-Formoterol (BREZTRI AEROSPHERE) 160-9-4.8 MCG/ACT AERO Inhale 2 puffs into the lungs in the morning and at bedtime. 32.1 g 3   Cholecalciferol (VITAMIN D) 125 MCG (5000 UT) CAPS Take 5,000 Units by mouth every Monday,Wednesday,Friday, and Sunday at 6 PM AND 10,000 Units every Tuesday, Thursday, and Saturday at 6 PM.     Continuous Glucose Monitor Sup KIT 1 Units by Does not apply route daily. 1 kit 0   Continuous Glucose Sensor (DEXCOM G6 SENSOR) MISC 1 Units by Does not apply route every 14 (fourteen) days. 2 each 0   Continuous Glucose Transmitter (DEXCOM G6 TRANSMITTER) MISC 1 Units by Does not apply route every 30 (thirty) days. 1 each 0   dapagliflozin propanediol (FARXIGA) 10 MG TABS tablet Take 1 tablet (10 mg total) by mouth daily before breakfast. 90 tablet 3   diltiazem (CARDIZEM CD) 240 MG 24 hr capsule  TAKE 1 CAPSULE BY MOUTH EVERY DAY 90 capsule 2   diphenoxylate-atropine (LOMOTIL) 2.5-0.025 MG tablet Take 1 tablet by mouth every 6 (six) hours as needed for diarrhea or loose stools. 30 tablet 1   insulin glargine (LANTUS SOLOSTAR) 100 UNIT/ML Solostar Pen Inject 20 Units into the skin every morning. 3 mL 3   Insulin Pen Needle 31G X 6 MM MISC Use one pen needle daily. 90 each 3   loratadine (CLARITIN) 10 MG tablet Take 10 mg by mouth at bedtime.      metFORMIN (GLUCOPHAGE-XR) 500 MG 24 hr tablet TAKE 2 TABLETS BY MOUTH TWICE A DAY 360 tablet 1   montelukast (SINGULAIR) 10 MG tablet TAKE 1 TABLET BY MOUTH EVERYDAY AT BEDTIME 90 tablet 1   ondansetron (ZOFRAN-ODT) 4 MG disintegrating tablet Take 1 tablet (4 mg total) by mouth every 6 (six) hours as needed for nausea or vomiting. 20 tablet 0   ONETOUCH VERIO test strip USE 1 EACH BY OTHER ROUTE AS NEEDED FOR OTHER. FREESTYLE ELITE 100 strip 3   pantoprazole (PROTONIX) 20 MG tablet Take 1 tablet (20 mg total) by mouth every morning. Take 30 minutes to hour before breakfast 90 tablet 3   Probiotic Product (PROBIOTIC PO) Take 1 capsule by mouth daily.     Semaglutide,0.25 or 0.5MG /DOS, (OZEMPIC, 0.25 OR 0.5 MG/DOSE,) 2 MG/1.5ML SOPN Inject  0.11 mg into the skin once a week. 8 clicks     telmisartan-hydrochlorothiazide (MICARDIS HCT) 80-12.5 MG tablet TAKE 1 TABLET BY MOUTH EVERY DAY 90 tablet 1   venlafaxine XR (EFFEXOR-XR) 150 MG 24 hr capsule TAKE 1 CAPSULE BY MOUTH DAILY WITH BREAKFAST. 90 capsule 1   vitamin B-12 (CYANOCOBALAMIN) 1000 MCG tablet Take 1,000 mcg by mouth at bedtime.     No facility-administered medications prior to visit.    Review of Systems;  Patient denies headache, fevers, malaise, unintentional weight loss, skin rash, eye pain, sinus congestion and sinus pain, sore throat, dysphagia,  hemoptysis , cough, dyspnea, wheezing, chest pain, palpitations, orthopnea, edema, abdominal pain, nausea, melena, diarrhea, constipation,  flank pain, dysuria, hematuria, urinary  Frequency, nocturia, numbness, tingling, seizures,  Focal weakness, Loss of consciousness,  Tremor, insomnia, depression, anxiety, and suicidal ideation.      Objective:  BP 116/68   Pulse 79   Temp 98 F (36.7 C)   Ht 5\' 3"  (1.6 m)   Wt 190 lb 3.2 oz (86.3 kg)   SpO2 92%   BMI 33.69 kg/m   BP Readings from Last 3 Encounters:  11/07/23 116/68  10/08/23 102/61  10/07/23 120/76    Wt Readings from Last 3 Encounters:  11/07/23 190 lb 3.2 oz (86.3 kg)  10/08/23 189 lb (85.7 kg)  10/07/23 190 lb 1.6 oz (86.2 kg)    Physical Exam Vitals reviewed.  Constitutional:      General: She is not in acute distress.    Appearance: Normal appearance. She is normal weight. She is not ill-appearing, toxic-appearing or diaphoretic.  HENT:     Head: Normocephalic.  Eyes:     General: No scleral icterus.       Right eye: No discharge.        Left eye: No discharge.     Conjunctiva/sclera: Conjunctivae normal.  Cardiovascular:     Rate and Rhythm: Normal rate and regular rhythm.     Heart sounds: Normal heart sounds.  Pulmonary:     Effort: Pulmonary effort is normal. No respiratory distress.     Breath sounds: Normal breath sounds.  Musculoskeletal:        General: Normal range of motion.  Skin:    General: Skin is warm and dry.  Neurological:     General: No focal deficit present.     Mental Status: She is alert and oriented to person, place, and time. Mental status is at baseline.  Psychiatric:        Mood and Affect: Mood normal.        Behavior: Behavior normal.        Thought Content: Thought content normal.        Judgment: Judgment normal.    Lab Results  Component Value Date   HGBA1C 6.9 (A) 10/07/2023   HGBA1C 9.0 (A) 06/16/2023   HGBA1C 8.5 (A) 03/14/2023    Lab Results  Component Value Date   CREATININE 0.80 06/25/2023   CREATININE 0.87 05/01/2023   CREATININE 1.09 (H) 03/24/2023    Lab Results  Component Value  Date   WBC 6.9 03/24/2023   HGB 13.5 03/24/2023   HCT 42.8 03/24/2023   PLT 253 03/24/2023   GLUCOSE 201 (H) 06/25/2023   CHOL 176 06/25/2023   TRIG 126 06/25/2023   HDL 56 06/25/2023   LDLCALC 95 06/25/2023   ALT 21 06/25/2023   AST 22 06/25/2023   NA 138 06/25/2023   K 4.0 06/25/2023  CL 99 06/25/2023   CREATININE 0.80 06/25/2023   BUN 18 06/25/2023   CO2 28 06/25/2023   TSH 0.946 06/25/2023   INR 1.07 02/18/2019   HGBA1C 6.9 (A) 10/07/2023   MICROALBUR 1.2 10/07/2023    MM 3D SCREENING MAMMOGRAM BILATERAL BREAST  Result Date: 10/23/2023 CLINICAL DATA:  Screening. EXAM: DIGITAL SCREENING BILATERAL MAMMOGRAM WITH TOMOSYNTHESIS AND CAD TECHNIQUE: Bilateral screening digital craniocaudal and mediolateral oblique mammograms were obtained. Bilateral screening digital breast tomosynthesis was performed. The images were evaluated with computer-aided detection. COMPARISON:  Previous exam(s). ACR Breast Density Category a: The breasts are almost entirely fatty. FINDINGS: There are no findings suspicious for malignancy. IMPRESSION: No mammographic evidence of malignancy. A result letter of this screening mammogram will be mailed directly to the patient. RECOMMENDATION: Screening mammogram in one year. (Code:[id number]) BI-RADS CATEGORY  1: Negative. Electronically Signed   By: Emmaline Kluver M.D.   On: 10/23/2023 12:40    Assessment & Plan:  .Dysuria -     POCT urinalysis dipstick -     Urine Culture -     Urine Microscopic  Candiduria Assessment & Plan: She self treated for presumed UTI with Macrobid but has persistent symptoms.  UA and culture were negative for luekocytes and bacteris but positive for yeast .  Will treat with fluconazole .     Other orders -     Ciprofloxacin HCl; Take 1 tablet (250 mg total) by mouth 2 (two) times daily for 7 days.  Dispense: 14 tablet; Refill: 0 -     Extra Urine Specimen -     Fluconazole; Take 1 tablet (150 mg total) by mouth daily.   Dispense: 2 tablet; Refill: 0     Follow-up: No follow-ups on file.   Sherlene Shams, MD

## 2023-11-08 LAB — URINE CULTURE
MICRO NUMBER:: 15706066
Result:: NO GROWTH
SPECIMEN QUALITY:: ADEQUATE

## 2023-11-08 LAB — URINALYSIS, MICROSCOPIC ONLY
Hyaline Cast: NONE SEEN /[LPF]
WBC, UA: NONE SEEN /[HPF] (ref 0–5)

## 2023-11-08 LAB — EXTRA URINE SPECIMEN

## 2023-11-09 DIAGNOSIS — B3749 Other urogenital candidiasis: Secondary | ICD-10-CM | POA: Insufficient documentation

## 2023-11-09 MED ORDER — FLUCONAZOLE 150 MG PO TABS: 150.0000 mg | ORAL_TABLET | Freq: Every day | ORAL | 0 refills | Status: DC

## 2023-11-09 NOTE — Assessment & Plan Note (Signed)
She self treated for presumed UTI with Macrobid but has persistent symptoms.  UA and culture were negative for luekocytes and bacteris but positive for yeast .  Will treat with fluconazole .

## 2023-11-14 ENCOUNTER — Other Ambulatory Visit: Payer: Self-pay | Admitting: Family

## 2023-11-14 ENCOUNTER — Other Ambulatory Visit: Payer: Self-pay | Admitting: Internal Medicine

## 2023-11-14 DIAGNOSIS — J45909 Unspecified asthma, uncomplicated: Secondary | ICD-10-CM

## 2023-11-22 ENCOUNTER — Other Ambulatory Visit: Payer: Self-pay | Admitting: Family

## 2023-11-22 DIAGNOSIS — Z794 Long term (current) use of insulin: Secondary | ICD-10-CM

## 2023-11-22 DIAGNOSIS — E1169 Type 2 diabetes mellitus with other specified complication: Secondary | ICD-10-CM

## 2023-11-25 ENCOUNTER — Other Ambulatory Visit (INDEPENDENT_AMBULATORY_CARE_PROVIDER_SITE_OTHER): Payer: Self-pay | Admitting: Family Medicine

## 2023-11-25 ENCOUNTER — Other Ambulatory Visit: Payer: Self-pay | Admitting: Family

## 2023-11-25 DIAGNOSIS — E1169 Type 2 diabetes mellitus with other specified complication: Secondary | ICD-10-CM

## 2023-11-25 DIAGNOSIS — E1165 Type 2 diabetes mellitus with hyperglycemia: Secondary | ICD-10-CM

## 2023-12-04 ENCOUNTER — Telehealth: Payer: Self-pay

## 2023-12-04 NOTE — Telephone Encounter (Signed)
Submitted Provider portion by fax for Re-Enroll for 2025 for Farxiga & Breztri Inhaler.

## 2023-12-08 NOTE — Telephone Encounter (Signed)
Pt came by to sign some ppw for patient assistance. Pt states she believes she was told since she wasn't;t able to E sign the ppw online, the ppw would be in office for her to comp. Please advise. Call back # [phone]

## 2023-12-09 ENCOUNTER — Ambulatory Visit: Payer: PPO | Admitting: Pulmonary Disease

## 2023-12-09 ENCOUNTER — Encounter: Payer: Self-pay | Admitting: Pulmonary Disease

## 2023-12-09 VITALS — BP 120/62 | HR 80 | Temp 97.0°F | Ht 63.0 in | Wt 191.8 lb

## 2023-12-09 DIAGNOSIS — J4489 Other specified chronic obstructive pulmonary disease: Secondary | ICD-10-CM

## 2023-12-09 DIAGNOSIS — J3089 Other allergic rhinitis: Secondary | ICD-10-CM

## 2023-12-09 DIAGNOSIS — G4733 Obstructive sleep apnea (adult) (pediatric): Secondary | ICD-10-CM

## 2023-12-09 DIAGNOSIS — J4540 Moderate persistent asthma, uncomplicated: Secondary | ICD-10-CM | POA: Diagnosis not present

## 2023-12-09 LAB — NITRIC OXIDE: Nitric Oxide: 18

## 2023-12-09 NOTE — Progress Notes (Signed)
Subjective:    Patient ID: Vanessa Higgins, female    DOB: May 11, 1951, 72 y.o.   MRN: [id number]  Patient Care Team: Allegra Grana, FNP as PCP - General (Family Medicine) End, Cristal Deer, MD as PCP - Cardiology (Cardiology)  Chief Complaint  Patient presents with   Follow-up    No SOB, wheezing or cough.    BACKGROUND/INTERVAL:[name] is a 72 year old lifelong never smoker who presents for follow-up of asthma with chronic asthmatic bronchitis. This is a scheduled visit.  We last saw her on 25 March 2023.  She has been on [place] and that she is doing well with this medication.  She has not had any asthmatic bronchitis exacerbations since last seen.  She has had some mild over the last few days without infectious symptoms.  This is a scheduled visit.  HPI Discussed the use of AI scribe software for clinical note transcription with the patient, who gave verbal consent to proceed.  History of Present Illness   The patient, an employee in the endoscopy department, presents with recent onset of sneezing and eye irritation, which she attributes to an unidentified exposure outside of work. Despite these symptoms, she reports no impact on her respiratory status. She has been managing well with her asthma, with minimal need for her rescue inhaler, indicating good control of her condition.  She also reports issues with her current CPAP mask, which has been causing skin irritation on her nose and is not fitting well when she sleeps on her side. She expresses interest in trying a different mask style, specifically a nasal cradle mask, to improve comfort and fit during sleep. She has been experiencing difficulties in obtaining a new mask due to issues with the ordering process.  Her OSA/CPAP is followed.  Offered to take over this process for her.  The patient's last pulmonary function test (PFT) was conducted in 2022, and she is due for another one to monitor for any decline in her lung function. She  is up-to-date with her immunizations and denies any smoking history.      DATA 11/01/2021 PFTs: FEV1 1.25 L or 59% predicted, FVC 2.23 L or 79% predicted, FEV1/FVC 56%.  No significant bronchodilator response.  Lung volumes show mild hyperinflation and air trapping.  There is significant reduction in airway resistance postbronchodilator.  Diffusion capacity normal.  Compared to PFTs 05/12/2020, no significant change.  Review of Systems A 10 point review of systems was performed and it is as noted above otherwise negative.   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   Depression, major, single episode, complete remission (HCC) 11/18/2018   HNP (herniated nucleus pulposus), lumbar 02/06/2018   Fatty liver disease, nonalcoholic 10/15/2017   GERD (gastroesophageal reflux disease) 10/15/2017   PVC's (premature ventricular contractions) 08/01/2017   OSA (obstructive sleep apnea) 04/21/2017   Vertigo 03/26/2017   Irregular heart rate 06/05/2016   Vitamin D deficiency 12/02/2014   IDA (iron deficiency anemia) 10/20/2014   Routine general medical examination  at a health care facility 05/03/2013   Diabetes mellitus (HCC) 07/09/2012   Essential hypertension 07/09/2012   Hyperlipidemia LDL goal <70 07/09/2012   Asthma 07/09/2012   Class 1 obesity due to excess calories with body mass index (BMI) of 33.0 to 33.9 in adult 07/09/2012    Social History   Tobacco Use   Smoking status: Never   Smokeless  tobacco: Never  Substance Use Topics   Alcohol use: No    Allergies  Allergen Reactions   Sulfa Antibiotics Hives, Shortness Of Breath and Rash   Lisinopril Cough   Ozempic (0.25 Or 0.5 Mg-Dose) [Semaglutide(0.25 Or 0.5mg -Dos)]     Nausea, vomiting   Ramipril Cough    Current Meds  Medication Sig   albuterol (VENTOLIN HFA) 108 (90 Base) MCG/ACT inhaler TAKE 2 PUFFS BY MOUTH EVERY 6 HOURS AS NEEDED FOR WHEEZE OR SHORTNESS OF BREATH   aspirin EC 81 MG tablet Take 1 tablet (81 mg total) by mouth daily. Swallow whole.   atorvastatin (LIPITOR) 20 MG tablet TAKE 1 TABLET BY MOUTH EVERYDAY AT BEDTIME   BD PEN NEEDLE MICRO U/F 32G X 6 MM MISC USE 1 PEN NEEDLE DAILY   Budeson-Glycopyrrol-Formoterol (BREZTRI AEROSPHERE) 160-9-4.8 MCG/ACT AERO Inhale 2 puffs into the lungs in the morning and at bedtime.   Cholecalciferol (VITAMIN D) 125 MCG (5000 UT) CAPS Take 5,000 Units by mouth every Monday,Wednesday,Friday, and Sunday at 6 PM AND 10,000 Units every Tuesday, Thursday, and Saturday at 6 PM.   Continuous Glucose Monitor Sup KIT 1 Units by Does not apply route daily.   Continuous Glucose Sensor (DEXCOM G6 SENSOR) MISC 1 Units by Does not apply route every 14 (fourteen) days.   Continuous Glucose Transmitter (DEXCOM G6 TRANSMITTER) MISC 1 Units by Does not apply route every 30 (thirty) days.   dapagliflozin propanediol (FARXIGA) 10 MG TABS tablet Take 1 tablet (10 mg total) by mouth daily before breakfast.   diltiazem (CARDIZEM CD) 240 MG 24 hr capsule TAKE 1 CAPSULE BY MOUTH EVERY DAY   diphenoxylate-atropine (LOMOTIL) 2.5-0.025 MG tablet Take 1 tablet by mouth every 6 (six) hours as needed for diarrhea or loose stools.   fluconazole (DIFLUCAN) 150 MG tablet Take 1 tablet (150 mg total) by mouth daily.   insulin glargine (LANTUS SOLOSTAR) 100 UNIT/ML Solostar Pen Inject 20 Units into the skin every morning.   loratadine (CLARITIN) 10 MG tablet Take 10 mg by mouth at bedtime.    metFORMIN  (GLUCOPHAGE-XR) 500 MG 24 hr tablet TAKE 2 TABLETS BY MOUTH TWICE A DAY   montelukast (SINGULAIR) 10 MG tablet TAKE 1 TABLET BY MOUTH EVERYDAY AT BEDTIME   ondansetron (ZOFRAN-ODT) 4 MG disintegrating tablet Take 1 tablet (4 mg total) by mouth every 6 (six) hours as needed for nausea or vomiting.   ONETOUCH VERIO test strip USE 1 EACH BY OTHER ROUTE AS NEEDED FOR OTHER. FREESTYLE ELITE   pantoprazole (PROTONIX) 20 MG tablet Take 1 tablet (20 mg total) by mouth every morning. Take 30 minutes to hour before breakfast   Probiotic Product (PROBIOTIC PO) Take 1 capsule by mouth daily.   Semaglutide,0.25 or 0.5MG /DOS, (OZEMPIC, 0.25 OR 0.5 MG/DOSE,) 2 MG/1.5ML SOPN Inject 0.11 mg into the skin once a week. 8 clicks   telmisartan-hydrochlorothiazide (MICARDIS HCT) 80-12.5 MG tablet TAKE 1 TABLET BY MOUTH EVERY DAY   venlafaxine XR (EFFEXOR-XR) 150 MG 24 hr capsule TAKE 1 CAPSULE BY MOUTH DAILY WITH BREAKFAST.   vitamin B-12 (CYANOCOBALAMIN) 1000 MCG tablet Take 1,000  mcg by mouth at bedtime.    Immunization History  Administered Date(s) Administered   Fluad Quad(high Dose 65+) 10/02/2019   Influenza, High Dose Seasonal PF 10/06/2023   Influenza-Unspecified 09/02/2014, 10/02/2015, 09/28/2020, 09/28/2021   MMR 07/09/2010   PFIZER Comirnaty(Gray Top)Covid-19 Tri-Sucrose Vaccine 12/02/2019, 01/04/2020, 12/14/2020, 06/01/2021   PFIZER(Purple Top)SARS-COV-2 Vaccination 12/02/2019, 01/04/2020, 12/14/2020   PNEUMOCOCCAL CONJUGATE-20 08/19/2022   Pfizer(Comirnaty)Fall Seasonal Vaccine 12 years and older 10/22/2022, 09/12/2023   Pneumococcal Conjugate-13 12/02/2014   Pneumococcal Polysaccharide-23 07/09/2001, 12/04/2016   Td 07/09/2010   Tdap 08/22/2021   Zoster Recombinant(Shingrix) 06/29/2022   Zoster, Live 10/27/2013        Objective:   BP 120/62 (BP Location: Right Arm, Cuff Size: Normal)   Pulse 80   Temp (!) 97 F (36.1 C)   Ht 5\' 3"  (1.6 m)   Wt 191 lb 12.8 oz (87 kg)   SpO2 97%   BMI  33.98 kg/m   SpO2: 97 % O2 Device: None (Room air)  GENERAL: This is a well-developed obese woman in no acute respiratory distress.  She is fully ambulatory. No conversational dyspnea. HEAD: Normocephalic, atraumatic. EYES: Pupils equal, round, reactive to light.  No scleral icterus. MOUTH: Dentition intact, oral mucosa moist.  No thrush. NECK: Supple. No thyromegaly. No nodules. No JVD.  Trachea midline.  No crepitus. PULMONARY: Symmetrical air entry.  Moving air well.  No adventitious sounds. CARDIOVASCULAR: S1 and S2. Regular rate and rhythm.  No rubs murmurs gallops heard. GASTROINTESTINAL: Obese abdomen, soft. MUSCULOSKELETAL: No joint deformity, no clubbing, no edema.  Increased AP diameter. NEUROLOGIC: No focal deficits noted.  Speech is fluent.  Awake, alert, oriented. SKIN: Intact,warm,dry, limited exam: No rashes. PSYCH: Normal mood and behavior.H:  Lab Results  Component Value Date   NITRICOXIDE 18 12/09/2023     Assessment & Plan:     ICD-10-CM   1. Moderate persistent asthma without complication  J45.40 Nitric oxide    Pulmonary Function Test [facility] Only    2. Asthmatic bronchitis , chronic (HCC)  J44.89     3. Perennial allergic rhinitis  J30.89     4. OSA (obstructive sleep apnea)  G47.33       Orders Placed This Encounter  Procedures   Nitric oxide   Pulmonary Function Test [facility] Only    Standing Status:   Future    Expiration Date:   12/08/2024    Full PFT: includes the following: basic spirometry, spirometry pre & post bronchodilator, diffusion capacity (DLCO), lung volumes:   Full PFT    This test can only be performed at:   [facility]   Discussion:    Asthma Asthma is well controlled with minimal use of rescue inhaler. Recent allergen exposure caused sneezing and eye irritation without affecting lung function. Physical exam shows clear lungs. Nitric oxide test for type 2 inflammation was normal. Discussed the importance of monitoring airway  inflammation and the role of nitric oxide testing in detecting subclinical inflammation. - Check nitric oxide levels - Order PFT before next visit - Recommend Zyrtec for allergy symptoms  Obstructive Sleep Apnea Current CPAP mask causing nasal rash and discomfort, especially when moving at night. Sleeps primarily on her side. Discussed alternative CPAP mask options, specifically the [facility] nasal cradle mask, which may be more comfortable and effective. Provided information on nasal cushion, silicone pillows, and full-face masks. Emphasized the importance of finding a comfortable and effective mask to ensure compliance with CPAP therapy. - Discussed information on [employer] CPAP  mask - Recommend visiting [facility] site for mask options - Offer to take over CPAP management if needed  General Health Maintenance Up to date on vaccinations and is a non-smoker. - Continue to avoid smoking  Follow-up - Schedule follow-up appointment in six months - Ensure PFT is completed before next visit.       Advised if symptoms do not improve or worsen, to please contact office for sooner follow up or seek emergency care.    I spent xxx minutes of dedicated to the care of this patient on the date of this encounter to include pre-visit review of records, face-to-face time with the patient discussing conditions above, post visit ordering of testing, clinical documentation with the electronic health record, making appropriate referrals as documented, and communicating necessary findings to members of the patients care team.     C. Danice Goltz, MD Advanced Bronchoscopy PCCM [facility]    *This note was generated using voice recognition software/Dragon and/or AI transcription program.  Despite best efforts to proofread, errors can occur which can change the meaning. Any transcriptional errors that result from this process are unintentional and may not be fully corrected at  the time of dictation.

## 2023-12-09 NOTE — Patient Instructions (Signed)
VISIT SUMMARY:  During today's visit, we discussed your recent symptoms of sneezing and eye irritation, which you believe are due to an unidentified exposure outside of work. We also addressed your asthma management and issues with your current CPAP mask. Additionally, we reviewed your general health maintenance and planned for future follow-ups.  YOUR PLAN:  -ASTHMA: Asthma is a condition where your airways become inflamed and narrow, making it hard to breathe. Your asthma is well controlled with minimal use of your rescue inhaler. We discussed the recent allergen exposure causing sneezing and eye irritation, and I recommended taking Zyrtec for these allergy symptoms. We will check your nitric oxide levels and order a pulmonary function test (PFT) before your next visit to monitor your lung function.  -OBSTRUCTIVE SLEEP APNEA: Obstructive Sleep Apnea is a condition where your breathing stops and starts during sleep due to blocked airways. Your current CPAP mask is causing discomfort and a nasal rash, especially when you sleep on your side. We discussed alternative CPAP mask options, including the [employer] nasal cradle mask, which may be more comfortable for you. I provided information on different mask types and recommended visiting the [place] site for more options. If needed, I can take over the management of your CPAP therapy.  -GENERAL HEALTH MAINTENANCE: You are up to date on your vaccinations and do not smoke. Continue to avoid smoking to maintain your good health.  INSTRUCTIONS:  Please schedule a follow-up appointment in six months and ensure that you complete the pulmonary function test (PFT) before your next visit.

## 2023-12-12 ENCOUNTER — Encounter: Payer: Self-pay | Admitting: Pulmonary Disease

## 2023-12-15 ENCOUNTER — Telehealth: Payer: Self-pay | Admitting: Family

## 2023-12-15 DIAGNOSIS — E1169 Type 2 diabetes mellitus with other specified complication: Secondary | ICD-10-CM

## 2023-12-15 DIAGNOSIS — J45909 Unspecified asthma, uncomplicated: Secondary | ICD-10-CM

## 2023-12-15 NOTE — Telephone Encounter (Signed)
Prescription Request  12/15/2023  LOV: 10/07/2023  What is the name of the medication or equipment? Marcelline Deist and breztri  Have you contacted your pharmacy to request a refill? Yes   Which pharmacy would you like this sent to?  [facility] fax# [phone]  Patient notified that their request is being sent to the clinical staff for review and that they should receive a response within 2 business days.   Please advise at Mobile [phone] (mobile)

## 2023-12-16 NOTE — Telephone Encounter (Signed)
I have pended breztri and farxiga  I do not see pharmacy listed in chart that she mentioned in refill request  Please call pt and add pharmacy  You may sign pended order for breztri and farxiga

## 2023-12-16 NOTE — Telephone Encounter (Signed)
Faxed   on 12/16/23 received confirmation that they received it also

## 2023-12-16 NOTE — Telephone Encounter (Signed)
Signed and put in jenate's seat  Please fax and scan copy to chart

## 2023-12-22 ENCOUNTER — Other Ambulatory Visit: Payer: Self-pay | Admitting: Family

## 2023-12-22 DIAGNOSIS — Z794 Long term (current) use of insulin: Secondary | ICD-10-CM

## 2023-12-29 ENCOUNTER — Telehealth: Payer: Self-pay | Admitting: Family

## 2023-12-29 NOTE — Telephone Encounter (Signed)
close

## 2023-12-30 ENCOUNTER — Other Ambulatory Visit: Payer: Self-pay | Admitting: Pulmonary Disease

## 2023-12-30 ENCOUNTER — Other Ambulatory Visit: Payer: Self-pay | Admitting: Family

## 2024-01-07 ENCOUNTER — Telehealth: Payer: Self-pay

## 2024-01-07 ENCOUNTER — Ambulatory Visit: Payer: PPO | Admitting: Family

## 2024-01-07 ENCOUNTER — Encounter: Payer: Self-pay | Admitting: Family

## 2024-01-07 ENCOUNTER — Other Ambulatory Visit (HOSPITAL_COMMUNITY): Payer: Self-pay

## 2024-01-07 VITALS — BP 126/78 | HR 76 | Temp 98.6°F | Ht 63.0 in | Wt 192.6 lb

## 2024-01-07 DIAGNOSIS — Z794 Long term (current) use of insulin: Secondary | ICD-10-CM

## 2024-01-07 DIAGNOSIS — I10 Essential (primary) hypertension: Secondary | ICD-10-CM

## 2024-01-07 DIAGNOSIS — E1165 Type 2 diabetes mellitus with hyperglycemia: Secondary | ICD-10-CM | POA: Diagnosis not present

## 2024-01-07 DIAGNOSIS — R7309 Other abnormal glucose: Secondary | ICD-10-CM

## 2024-01-07 LAB — COMPREHENSIVE METABOLIC PANEL
ALT: 18 U/L (ref 0–35)
AST: 24 U/L (ref 0–37)
Albumin: 4.2 g/dL (ref 3.5–5.2)
Alkaline Phosphatase: 46 U/L (ref 39–117)
BUN: 15 mg/dL (ref 6–23)
CO2: 34 meq/L — ABNORMAL HIGH (ref 19–32)
Calcium: 9.9 mg/dL (ref 8.4–10.5)
Chloride: 96 meq/L (ref 96–112)
Creatinine, Ser: 0.77 mg/dL (ref 0.40–1.20)
GFR: 75.99 mL/min (ref >60.00)
Glucose, Bld: 151 mg/dL — ABNORMAL HIGH (ref 70–99)
Potassium: 4 meq/L (ref 3.5–5.1)
Sodium: 140 meq/L (ref 135–145)
Total Bilirubin: 0.6 mg/dL (ref 0.2–1.2)
Total Protein: 7.7 g/dL (ref 6.0–8.3)

## 2024-01-07 LAB — POCT GLYCOSYLATED HEMOGLOBIN (HGB A1C): Hemoglobin A1C: 7.3 % — AB (ref 4.0–5.6)

## 2024-01-07 MED ORDER — OZEMPIC (0.25 OR 0.5 MG/DOSE) 2 MG/1.5ML ~~LOC~~ SOPN: 0.2500 mg | PEN_INJECTOR | SUBCUTANEOUS | 3 refills | Status: DC

## 2024-01-07 NOTE — Patient Instructions (Signed)
[email or web address]

## 2024-01-07 NOTE — Assessment & Plan Note (Signed)
 Lab Results  Component Value Date   HGBA1C 7.3 (A) 01/07/2024   Slight increase today .Recent dietary indiscretion. Tolerating Ozempic , 18 clicks ( which is 0.25mg ) , as advised by Dr [place] and tolerating.  Reiterated the importance of decreasing Lantus  20 units every day by 4 units if she has 3 consecutive days and fasting blood glucose less than 130 to avoid hypoglycemia.  Continue Farxiga  10 mg daily, metformin  1000 mg twice daily.

## 2024-01-07 NOTE — Assessment & Plan Note (Signed)
Chronic, stable.  Continue diltiazem 240 mg, telmisartan-hydrochlorothiazide 80-25 mg 

## 2024-01-07 NOTE — Progress Notes (Signed)
 Assessment & Plan:  Type 2 diabetes mellitus with hyperglycemia, with long-term current use of insulin  [facility]) Assessment & Plan: Lab Results  Component Value Date   HGBA1C 7.3 (A) 01/07/2024   Slight increase today .Recent dietary indiscretion. Tolerating Ozempic , 18 clicks ( which is 0.25mg ) , as advised by Dr [place] and tolerating.  Reiterated the importance of decreasing Lantus  20 units every day by 4 units if she has 3 consecutive days and fasting blood glucose less than 130 to avoid hypoglycemia.  Continue Farxiga  10 mg daily, metformin  1000 mg twice daily.  Orders: -     Ozempic  (0.25 or 0.5 MG/DOSE); Inject 0.25 mg into the skin once a week.  Dispense: 3 mL; Refill: 3  Elevated glucose -     POCT glycosylated hemoglobin (Hb A1C)  Essential hypertension Assessment & Plan: Chronic, stable.  Continue diltiazem  240 mg, telmisartan -hydrochlorothiazide 80-25 mg  Orders: -     Comprehensive metabolic panel     Return precautions given.   Risks, benefits, and alternatives of the medications and treatment plan prescribed today were discussed, and patient expressed understanding.   Education regarding symptom management and diagnosis given to patient on AVS either electronically or printed.  No follow-ups on file.  [name], FNP  Subjective:    Patient ID: Vanessa Higgins, female    DOB: July 18, 1951, 73 y.o.   MRN: [id number]  CC: Vanessa Higgins is a 73 y.o. female who presents today for follow up.   HPI: Feels well today.  No new complaints.  Endorses dietary indiscretion over the holidays.  No hypoglycemic episodes FBG 140-150  Compliant with 18 clicks of ozempic  weekly. Tolerating ozempic  without nausea and vomiting.  She continues to follow with [facility].   She is walking more lately; 3 days per week, 20 minutes.     Allergies: Sulfa antibiotics, Lisinopril, Ozempic  (0.25 or 0.5 mg-dose) [semaglutide (0.25 or 0.5mg -dos)], and  Ramipril  Current Outpatient Medications on File Prior to Visit  Medication Sig Dispense Refill   albuterol  (VENTOLIN  HFA) 108 (90 Base) MCG/ACT inhaler TAKE 2 PUFFS BY MOUTH EVERY 6 HOURS AS NEEDED FOR WHEEZE OR SHORTNESS OF BREATH 18 each 11   aspirin  EC 81 MG tablet Take 1 tablet (81 mg total) by mouth daily. Swallow whole. 90 tablet 3   atorvastatin  (LIPITOR) 20 MG tablet TAKE 1 TABLET BY MOUTH EVERYDAY AT BEDTIME 90 tablet 1   BD PEN NEEDLE MICRO U/F 32G X 6 MM MISC USE 1 PEN NEEDLE DAILY 100 each 3   Budeson-Glycopyrrol-Formoterol  (BREZTRI  AEROSPHERE) 160-9-4.8 MCG/ACT AERO Inhale 2 puffs into the lungs in the morning and at bedtime. 32.1 g 3   Cholecalciferol  (VITAMIN D ) 125 MCG (5000 UT) CAPS Take 5,000 Units by mouth every Monday,Wednesday,Friday, and Sunday at 6 PM AND 10,000 Units every Tuesday, Thursday, and Saturday at 6 PM.     dapagliflozin  propanediol (FARXIGA ) 10 MG TABS tablet Take 1 tablet (10 mg total) by mouth daily before breakfast. 90 tablet 3   diltiazem  (CARDIZEM  CD) 240 MG 24 hr capsule TAKE 1 CAPSULE BY MOUTH EVERY DAY 90 capsule 2   diphenoxylate -atropine  (LOMOTIL ) 2.5-0.025 MG tablet Take 1 tablet by mouth every 6 (six) hours as needed for diarrhea or loose stools. 30 tablet 1   fluconazole  (DIFLUCAN ) 150 MG tablet Take 1 tablet (150 mg total) by mouth daily. 2 tablet 0   insulin  glargine (LANTUS  SOLOSTAR) 100 UNIT/ML Solostar Pen INJECT 20 UNITS INTO THE SKIN EVERY MORNING.  15 mL 3   loratadine  (CLARITIN ) 10 MG tablet Take 10 mg by mouth at bedtime.      metFORMIN  (GLUCOPHAGE -XR) 500 MG 24 hr tablet TAKE 2 TABLETS BY MOUTH TWICE A DAY 360 tablet 1   montelukast  (SINGULAIR ) 10 MG tablet TAKE 1 TABLET BY MOUTH EVERYDAY AT BEDTIME 90 tablet 1   ondansetron  (ZOFRAN -ODT) 4 MG disintegrating tablet Take 1 tablet (4 mg total) by mouth every 6 (six) hours as needed for nausea or vomiting. 20 tablet 0   ONETOUCH VERIO test strip USE 1 EACH BY OTHER ROUTE AS NEEDED FOR OTHER.  FREESTYLE ELITE 100 strip 3   pantoprazole  (PROTONIX ) 20 MG tablet Take 1 tablet (20 mg total) by mouth every morning. Take 30 minutes to hour before breakfast 90 tablet 3   Probiotic Product (PROBIOTIC PO) Take 1 capsule by mouth daily.     telmisartan -hydrochlorothiazide (MICARDIS  HCT) 80-12.5 MG tablet TAKE 1 TABLET BY MOUTH EVERY DAY 90 tablet 1   venlafaxine  XR (EFFEXOR -XR) 150 MG 24 hr capsule TAKE 1 CAPSULE BY MOUTH DAILY WITH BREAKFAST. 90 capsule 1   vitamin B-12 (CYANOCOBALAMIN ) 1000 MCG tablet Take 1,000 mcg by mouth at bedtime.     Continuous Glucose Monitor Sup KIT 1 Units by Does not apply route daily. (Patient not taking: Reported on 01/07/2024) 1 kit 0   Continuous Glucose Sensor (DEXCOM G6 SENSOR) MISC 1 Units by Does not apply route every 14 (fourteen) days. (Patient not taking: Reported on 01/07/2024) 2 each 0   Continuous Glucose Transmitter (DEXCOM G6 TRANSMITTER) MISC 1 Units by Does not apply route every 30 (thirty) days. (Patient not taking: Reported on 01/07/2024) 1 each 0   No current facility-administered medications on file prior to visit.    Review of Systems  Constitutional:  Negative for chills and fever.  Respiratory:  Negative for cough.   Cardiovascular:  Negative for chest pain and palpitations.  Gastrointestinal:  Negative for nausea and vomiting.      Objective:    BP 126/78   Pulse 76   Temp 98.6 F (37 C) (Oral)   Ht 5' 3 (1.6 m)   Wt 192 lb 9.6 oz (87.4 kg)   SpO2 95%   BMI 34.12 kg/m  BP Readings from Last 3 Encounters:  01/07/24 126/78  12/09/23 120/62  11/07/23 116/68   Wt Readings from Last 3 Encounters:  01/07/24 192 lb 9.6 oz (87.4 kg)  12/09/23 191 lb 12.8 oz (87 kg)  11/07/23 190 lb 3.2 oz (86.3 kg)    Physical Exam Vitals reviewed.  Constitutional:      Appearance: She is well-developed.  Eyes:     Conjunctiva/sclera: Conjunctivae normal.  Cardiovascular:     Rate and Rhythm: Normal rate and regular rhythm.     Pulses:  Normal pulses.     Heart sounds: Normal heart sounds.  Pulmonary:     Effort: Pulmonary effort is normal.     Breath sounds: Normal breath sounds. No wheezing, rhonchi or rales.  Skin:    General: Skin is warm and dry.  Neurological:     Mental Status: She is alert.  Psychiatric:        Speech: Speech normal.        Behavior: Behavior normal.        Thought Content: Thought content normal.

## 2024-01-07 NOTE — Telephone Encounter (Signed)
 Spoke to patient and she confirmed verifying spelling of her Name to [place] and completing APPS for [employer]. Sent Prov. Portion to office for both medications.

## 2024-01-09 DIAGNOSIS — G4733 Obstructive sleep apnea (adult) (pediatric): Secondary | ICD-10-CM | POA: Diagnosis not present

## 2024-01-12 ENCOUNTER — Other Ambulatory Visit (HOSPITAL_COMMUNITY): Payer: Self-pay

## 2024-01-12 ENCOUNTER — Telehealth: Payer: Self-pay | Admitting: Pharmacist

## 2024-01-12 ENCOUNTER — Telehealth: Payer: Self-pay

## 2024-01-12 NOTE — Telephone Encounter (Signed)
 Pt should be eligible for Ozempic patient assistance through [employer].  Received signed provider portion, sent full application and insurance card to [employer]

## 2024-01-12 NOTE — Telephone Encounter (Signed)
 Pharmacy Patient Advocate Encounter   Received notification from CoverMyMeds that prior authorization for Ozempic  (0.25 or 0.5 MG/DOSE) 2MG /3ML pen-injectors is required/requested.   Insurance verification completed.   The patient is insured through [facility] ADVANTAGE/RX ADVANCE .   Per test claim: PA required; PA submitted to above mentioned insurance via CoverMyMeds Key/confirmation #/EOC [id number] Status is pending

## 2024-01-12 NOTE — Telephone Encounter (Signed)
 LVM to inform pt that her Ozempic has been approved

## 2024-01-12 NOTE — Telephone Encounter (Signed)
 Pharmacy Patient Advocate Encounter  Received notification from [facility] ADVANTAGE/RX ADVANCE that Prior Authorization for Ozempic  (0.25 or 0.5 MG/DOSE) 2MG /3ML pen-injectorshas been APPROVED from 01/11/2025 to 01/11/2025   PA #/Case ID/Reference #: [id number]

## 2024-01-12 NOTE — Telephone Encounter (Signed)
 Pt has been notified.

## 2024-01-14 ENCOUNTER — Telehealth: Payer: Self-pay

## 2024-01-14 NOTE — Telephone Encounter (Signed)
 Pt informed that her Ozempic  was approved through pt assistance

## 2024-01-14 NOTE — Telephone Encounter (Signed)
 Copied from CRM [id number]. Topic: General - Call Back - No Documentation >> Jan 14, 2024  4:26 PM Jayson Michael wrote: Reason for CRM: Pt returning missed call- couldn't find anything in notes   callback [phone]

## 2024-01-15 DIAGNOSIS — E1159 Type 2 diabetes mellitus with other circulatory complications: Secondary | ICD-10-CM | POA: Diagnosis not present

## 2024-01-15 DIAGNOSIS — I152 Hypertension secondary to endocrine disorders: Secondary | ICD-10-CM | POA: Diagnosis not present

## 2024-01-15 DIAGNOSIS — Z96653 Presence of artificial knee joint, bilateral: Secondary | ICD-10-CM | POA: Diagnosis not present

## 2024-01-21 NOTE — Telephone Encounter (Signed)
PAP: Patient assistance application Breztri for has been approved by PAP Companies: [place] from 12-31-2023 to 12-29-2024. Medication should be delivered to PAP Delivery: Home For further shipping updates, please contact [employer] ([employer]) at [phone] Pt ID is: not provided

## 2024-01-22 ENCOUNTER — Telehealth: Payer: Self-pay

## 2024-01-22 NOTE — Telephone Encounter (Signed)
PAP: Patient assistance application Breztri for has been approved by PAP Companies: [place] from 12-25-2023 to 12-29-2024. Medication should be delivered to PAP Delivery: Home For further shipping updates, please contact [employer] ([employer]) at [phone] Pt ID is: not provided

## 2024-01-29 NOTE — Progress Notes (Signed)
Pharmacy Medication Assistance Program Note    01/29/2024  Patient ID: Vanessa Higgins, female   DOB: 18-Apr-1951, 73 y.o.   MRN: [id number]     12/04/2023 01/29/2024  Outreach Medication One  Initial Outreach Date (Medication One) 11/25/2023   Manufacturer Medication One Astra Zeneca Astra Zeneca  [facility] Marcelline Deist Farxiga  Dose of Farxiga 10 mg daily 10mg   Type of Forensic scientist Assistance  Date Application Sent to Patient 12/04/2023   Application Items Requested  Application;Proof of Income  Date Application Sent to Prescriber 12/04/2023   Name of Prescriber Rennie Plowman   Patient Assistance Determination  Approved  Approval Start Date  12/31/2023  Approval End Date  12/29/2024     Signature    01/29/2024  Patient ID: Vanessa Higgins, female  DOB: 1951-04-23, 73 y.o.  MRN:  [id number]     12/04/2023 01/29/2024  Outreach Medication Two  Initial Outreach Date (Medication Two) 11/25/2023   Manufacturer Medication Two Astra Zeneca Astra Zeneca  [facility] Bretztri Bretztri  Dose of Breztri 2 puffs twice daily   Type of Assistance Manufacturer Assistance   Date Application Sent to Patient 12/04/2023   Date Application Sent to Prescriber 12/04/2023   Name of Prescriber Rennie Plowman   Date Application Received From Patient 12/04/2023   Patient Assistance Determination  Approved  Approval Start Date  12/31/2023

## 2024-02-02 NOTE — Progress Notes (Signed)
Pharmacy Medication Assistance Program Note    02/02/2024  Patient ID: Vanessa Higgins, female  DOB: 1951/05/12, 73 y.o.  MRN:  [id number]     02/02/2024  Outreach Medication Three  Initial Outreach Date (Medication Three) 01/12/2024  Manufacturer Medication Three Novo Nordisk  [facility] Ozempic  Dose of Ozempic 0.25 mg/ dose  Type of Audiological scientist Items Requested Application  Date Application Sent to Prescriber 01/12/2024  Name of Prescriber Rennie Plowman  Method Application Sent to Manufacturer Fax  Patient Assistance Determination Approved  Approval Start Date 01/29/2024  Approval End Date 01/23/2025  Patient Notification Method MyChart

## 2024-02-06 NOTE — Progress Notes (Signed)
 Pharmacy Medication Assistance Program Note    02/06/2024  Patient ID: Vanessa Higgins, female   DOB: Apr 12, 1951, 73 y.o.   MRN: [id number]     12/04/2023 01/29/2024  Outreach Medication One  Initial Outreach Date (Medication One) 11/25/2023   Manufacturer Medication One Astra Zeneca Astra Zeneca  [facility] Farxiga  Farxiga   Dose of Farxiga  10 mg daily 10mg   Type of Forensic Scientist Assistance  Date Application Sent to Patient 12/04/2023   Application Items Requested  Application;Proof of Income  Date Application Sent to Prescriber 12/04/2023   Name of Prescriber [name]   Patient Assistance Determination  Approved  Approval Start Date  12/31/2023  Approval End Date  12/29/2024     Signature    02/06/2024  Patient ID: Vanessa Higgins, female  DOB: [birth year], 73 y.o.  MRN:  [id number]     12/04/2023 01/29/2024  Outreach Medication Two  Initial Outreach Date (Medication Two) 11/25/2023   Manufacturer Medication Two Astra Zeneca Astra Zeneca  [facility] Bretztri Bretztri  Dose of Breztri  2 puffs twice daily   Type of Assistance Manufacturer Assistance   Date Application Sent to Patient 12/04/2023   Date Application Sent to Prescriber 12/04/2023   Name of Prescriber [name]   Date Application Received From Patient 12/04/2023   Patient Assistance Determination  Approved  Approval Start Date  12/31/2023   Enrolled until 12/29/2024     02/06/2024  Patient ID: Vanessa Higgins, female  DOB: [birth year], 73 y.o.  MRN:  [id number]     02/02/2024  Outreach Medication Three  Initial Outreach Date (Medication Three) 01/12/2024  Manufacturer Medication Three Novo Nordisk  [facility] Ozempic   Dose of Ozempic  0.25 mg/ dose  Type of Audiological Scientist Items Requested Application  Date Application Sent to Prescriber 01/12/2024  Name of Prescriber [name]  Method Application Sent to Manufacturer Fax   Patient Assistance Determination Approved  Approval Start Date 01/29/2024  Approval End Date 01/23/2025  Patient Notification Method MyChart

## 2024-02-10 ENCOUNTER — Telehealth: Payer: Self-pay

## 2024-02-10 NOTE — Telephone Encounter (Signed)
Spoke to pt and informed her that her pt Assistance medication came in Ozempic (1 box) came in and she can pick up anytime between 9-5 pm M-F

## 2024-02-17 NOTE — Telephone Encounter (Signed)
 Pt picked up medication.

## 2024-02-18 ENCOUNTER — Other Ambulatory Visit: Payer: Self-pay | Admitting: Family

## 2024-02-18 DIAGNOSIS — J45909 Unspecified asthma, uncomplicated: Secondary | ICD-10-CM

## 2024-02-18 DIAGNOSIS — E1169 Type 2 diabetes mellitus with other specified complication: Secondary | ICD-10-CM

## 2024-02-18 MED ORDER — DAPAGLIFLOZIN PROPANEDIOL 10 MG PO TABS: 10.0000 mg | ORAL_TABLET | Freq: Every day | ORAL | 3 refills | Status: DC

## 2024-02-18 MED ORDER — BREZTRI AEROSPHERE 160-9-4.8 MCG/ACT IN AERO: 2.0000 | INHALATION_SPRAY | Freq: Two times a day (BID) | RESPIRATORY_TRACT | 3 refills | Status: DC

## 2024-03-02 ENCOUNTER — Other Ambulatory Visit: Payer: Self-pay | Admitting: Family

## 2024-03-02 DIAGNOSIS — E1169 Type 2 diabetes mellitus with other specified complication: Secondary | ICD-10-CM

## 2024-03-02 DIAGNOSIS — I152 Hypertension secondary to endocrine disorders: Secondary | ICD-10-CM

## 2024-03-04 NOTE — Telephone Encounter (Signed)
 Vanessa Higgins, I received a note for missing information from [place] [employer] [place]  It is not clear what medication this for.Please let me know if I need to refill a medication; I am having a hard time telling from the chart

## 2024-04-08 ENCOUNTER — Ambulatory Visit: Payer: PPO | Admitting: Family

## 2024-04-22 ENCOUNTER — Ambulatory Visit: Admitting: Family

## 2024-04-22 VITALS — BP 112/58 | HR 85 | Temp 97.6°F | Ht 63.0 in | Wt 192.4 lb

## 2024-04-22 DIAGNOSIS — E1165 Type 2 diabetes mellitus with hyperglycemia: Secondary | ICD-10-CM | POA: Diagnosis not present

## 2024-04-22 DIAGNOSIS — Z794 Long term (current) use of insulin: Secondary | ICD-10-CM | POA: Diagnosis not present

## 2024-04-22 DIAGNOSIS — I10 Essential (primary) hypertension: Secondary | ICD-10-CM

## 2024-04-22 DIAGNOSIS — R7309 Other abnormal glucose: Secondary | ICD-10-CM

## 2024-04-22 LAB — POCT GLYCOSYLATED HEMOGLOBIN (HGB A1C): Hemoglobin A1C: 7.6 % — AB (ref 4.0–5.6)

## 2024-04-22 NOTE — Patient Instructions (Signed)
  For improved glycemic control, increase Lantus  to 22 units every day .  Very nice seeing you !

## 2024-04-22 NOTE — Assessment & Plan Note (Signed)
Chronic, stable.  Continue diltiazem 240 mg, telmisartan-hydrochlorothiazide 80-25 mg 

## 2024-04-22 NOTE — Progress Notes (Signed)
 Assessment & Plan:  Elevated glucose -     POCT glycosylated hemoglobin (Hb A1C)  Type 2 diabetes mellitus with hyperglycemia, with long-term current use of insulin  (HCC) Assessment & Plan: Increased from prior. Discussed dietary indiscretion. Tolerating Ozempic , 18 clicks ( which is 0.25mg ).  For improved glycemic control, increase Lantus  to 22 units every day .  Continue Farxiga  10 mg daily, metformin  1000 mg twice daily.   Essential hypertension Assessment & Plan: Chronic, stable.  Continue diltiazem  240 mg, telmisartan -hydrochlorothiazide 80-25 mg      Return precautions given.   Risks, benefits, and alternatives of the medications and treatment plan prescribed today were discussed, and patient expressed understanding.   Education regarding symptom management and diagnosis given to patient on AVS either electronically or printed.  Return in about 3 months (around 07/22/2024).  Bascom Bossier, FNP  Subjective:    Patient ID: Vanessa Higgins, female    DOB: 09-Jun-1951, 73 y.o.   MRN: [id number]  CC: Vanessa Higgins is a 73 y.o. female who presents today forfollow up.   HPI: Feels well today No new complaints  Postprandial after lunch 170-180. FBG 130-150.    Compliant with Ozempic , 18 clicks ( which is 0.25mg ) ; she is tolerating with very occasional nausea.   Compliant with Lantus  20 ,Farxiga  10 mg daily, metformin  1000 mg twice daily.   She endorses dietary indiscretion.    Allergies: Sulfa antibiotics, Lisinopril, Ozempic  (0.25 or 0.5 mg-dose) [semaglutide (0.25 or 0.5mg -dos)], and Ramipril  Current Outpatient Medications on File Prior to Visit  Medication Sig Dispense Refill   albuterol  (VENTOLIN  HFA) 108 (90 Base) MCG/ACT inhaler TAKE 2 PUFFS BY MOUTH EVERY 6 HOURS AS NEEDED FOR WHEEZE OR SHORTNESS OF BREATH 18 each 11   aspirin  EC 81 MG tablet Take 1 tablet (81 mg total) by mouth daily. Swallow whole. 90 tablet 3   atorvastatin  (LIPITOR) 20 MG tablet TAKE 1  TABLET BY MOUTH EVERYDAY AT BEDTIME 90 tablet 1   BD PEN NEEDLE MICRO U/F 32G X 6 MM MISC USE 1 PEN NEEDLE DAILY 100 each 3   Budeson-Glycopyrrol-Formoterol  (BREZTRI  AEROSPHERE) 160-9-4.8 MCG/ACT AERO Inhale 2 puffs into the lungs in the morning and at bedtime. 32.1 g 3   Cholecalciferol  (VITAMIN D ) 125 MCG (5000 UT) CAPS Take 5,000 Units by mouth every Monday,Wednesday,Friday, and Sunday at 6 PM AND 10,000 Units every Tuesday, Thursday, and Saturday at 6 PM.     dapagliflozin  propanediol (FARXIGA ) 10 MG TABS tablet Take 1 tablet (10 mg total) by mouth daily before breakfast. 90 tablet 3   diltiazem  (CARDIZEM  CD) 240 MG 24 hr capsule TAKE 1 CAPSULE BY MOUTH EVERY DAY 90 capsule 2   diphenoxylate -atropine  (LOMOTIL ) 2.5-0.025 MG tablet Take 1 tablet by mouth every 6 (six) hours as needed for diarrhea or loose stools. 30 tablet 1   fluconazole  (DIFLUCAN ) 150 MG tablet Take 1 tablet (150 mg total) by mouth daily. 2 tablet 0   insulin  glargine (LANTUS  SOLOSTAR) 100 UNIT/ML Solostar Pen INJECT 20 UNITS INTO THE SKIN EVERY MORNING. 15 mL 3   loratadine  (CLARITIN ) 10 MG tablet Take 10 mg by mouth at bedtime.      metFORMIN  (GLUCOPHAGE -XR) 500 MG 24 hr tablet TAKE 2 TABLETS BY MOUTH TWICE A DAY 360 tablet 1   montelukast  (SINGULAIR ) 10 MG tablet TAKE 1 TABLET BY MOUTH EVERYDAY AT BEDTIME 90 tablet 1   ondansetron  (ZOFRAN -ODT) 4 MG disintegrating tablet Take 1 tablet (4 mg total) by mouth every 6 (six)  hours as needed for nausea or vomiting. 20 tablet 0   ONETOUCH VERIO test strip USE 1 EACH BY OTHER ROUTE AS NEEDED FOR OTHER. FREESTYLE ELITE 100 strip 3   pantoprazole  (PROTONIX ) 20 MG tablet Take 1 tablet (20 mg total) by mouth every morning. Take 30 minutes to hour before breakfast 90 tablet 3   Probiotic Product (PROBIOTIC PO) Take 1 capsule by mouth daily.     Semaglutide ,0.25 or 0.5MG /DOS, (OZEMPIC , 0.25 OR 0.5 MG/DOSE,) 2 MG/1.5ML SOPN Inject 0.25 mg into the skin once a week. 3 mL 3    telmisartan -hydrochlorothiazide (MICARDIS  HCT) 80-12.5 MG tablet TAKE 1 TABLET BY MOUTH EVERY DAY 90 tablet 1   venlafaxine  XR (EFFEXOR -XR) 150 MG 24 hr capsule TAKE 1 CAPSULE BY MOUTH DAILY WITH BREAKFAST. 90 capsule 1   vitamin B-12 (CYANOCOBALAMIN ) 1000 MCG tablet Take 1,000 mcg by mouth at bedtime.     No current facility-administered medications on file prior to visit.    Review of Systems  Constitutional:  Negative for chills and fever.  Respiratory:  Negative for cough.   Cardiovascular:  Negative for chest pain and palpitations.  Gastrointestinal:  Positive for nausea (occassional). Negative for vomiting.      Objective:    BP (!) 112/58   Pulse 85   Temp 97.6 F (36.4 C)   Ht 5\' 3"  (1.6 m)   Wt 192 lb 6.4 oz (87.3 kg)   SpO2 94%   BMI 34.08 kg/m  BP Readings from Last 3 Encounters:  04/22/24 (!) 112/58  01/07/24 126/78  12/09/23 120/62   Wt Readings from Last 3 Encounters:  04/22/24 192 lb 6.4 oz (87.3 kg)  01/07/24 192 lb 9.6 oz (87.4 kg)  12/09/23 191 lb 12.8 oz (87 kg)    Physical Exam Vitals reviewed.  Constitutional:      Appearance: She is well-developed.  Eyes:     Conjunctiva/sclera: Conjunctivae normal.  Cardiovascular:     Rate and Rhythm: Normal rate and regular rhythm.     Pulses: Normal pulses.     Heart sounds: Normal heart sounds.  Pulmonary:     Effort: Pulmonary effort is normal.     Breath sounds: Normal breath sounds. No wheezing, rhonchi or rales.  Skin:    General: Skin is warm and dry.  Neurological:     Mental Status: She is alert.  Psychiatric:        Speech: Speech normal.        Behavior: Behavior normal.        Thought Content: Thought content normal.

## 2024-04-22 NOTE — Assessment & Plan Note (Signed)
 Increased from prior. Discussed dietary indiscretion. Tolerating Ozempic , 18 clicks ( which is 0.25mg ).  For improved glycemic control, increase Lantus  to 22 units every day .  Continue Farxiga  10 mg daily, metformin  1000 mg twice daily.

## 2024-05-25 ENCOUNTER — Telehealth: Payer: Self-pay | Admitting: Pulmonary Disease

## 2024-05-25 NOTE — Telephone Encounter (Signed)
 Patient needs PFT before 06/10/2024.

## 2024-06-02 ENCOUNTER — Other Ambulatory Visit: Payer: Self-pay

## 2024-06-02 ENCOUNTER — Ambulatory Visit: Admitting: Pulmonary Disease

## 2024-06-02 DIAGNOSIS — J4540 Moderate persistent asthma, uncomplicated: Secondary | ICD-10-CM | POA: Diagnosis not present

## 2024-06-02 LAB — PULMONARY FUNCTION TEST
DL/VA % pred: 89 %
DL/VA: 3.74 ml/min/mmHg/L
DLCO unc % pred: 100 %
DLCO unc: 18.32 ml/min/mmHg
FEF 25-75 Post: 0.79 L/s
FEF 25-75 Pre: 0.41 L/s
FEF2575-%Change-Post: 94 %
FEF2575-%Pred-Post: 47 %
FEF2575-%Pred-Pre: 24 %
FEV1-%Change-Post: 28 %
FEV1-%Pred-Post: 64 %
FEV1-%Pred-Pre: 50 %
FEV1-Post: 1.33 L
FEV1-Pre: 1.03 L
FEV1FVC-%Change-Post: 8 %
FEV1FVC-%Pred-Pre: 65 %
FEV6-%Change-Post: 18 %
FEV6-%Pred-Post: 93 %
FEV6-%Pred-Pre: 78 %
FEV6-Post: 2.42 L
FEV6-Pre: 2.03 L
FEV6FVC-%Change-Post: 0 %
FEV6FVC-%Pred-Post: 102 %
FEV6FVC-%Pred-Pre: 102 %
FVC-%Change-Post: 18 %
FVC-%Pred-Post: 90 %
FVC-%Pred-Pre: 76 %
FVC-Post: 2.48 L
FVC-Pre: 2.1 L
Post FEV1/FVC ratio: 53 %
Post FEV6/FVC ratio: 97 %
Pre FEV1/FVC ratio: 49 %
Pre FEV6/FVC Ratio: 97 %
RV % pred: 180 %
RV: 4 L
TLC % pred: 135 %
TLC: 6.62 L

## 2024-06-02 NOTE — Progress Notes (Signed)
 Full PFT completed today ? ?

## 2024-06-02 NOTE — Patient Instructions (Signed)
 Full PFT completed today ? ?

## 2024-06-05 ENCOUNTER — Other Ambulatory Visit: Payer: Self-pay | Admitting: Family

## 2024-06-05 DIAGNOSIS — K219 Gastro-esophageal reflux disease without esophagitis: Secondary | ICD-10-CM

## 2024-06-05 DIAGNOSIS — J45909 Unspecified asthma, uncomplicated: Secondary | ICD-10-CM

## 2024-06-07 ENCOUNTER — Ambulatory Visit: Attending: Internal Medicine | Admitting: Internal Medicine

## 2024-06-07 ENCOUNTER — Encounter: Payer: Self-pay | Admitting: Internal Medicine

## 2024-06-07 VITALS — BP 112/58 | HR 75 | Ht 63.0 in | Wt 197.0 lb

## 2024-06-07 DIAGNOSIS — E785 Hyperlipidemia, unspecified: Secondary | ICD-10-CM

## 2024-06-07 DIAGNOSIS — I351 Nonrheumatic aortic (valve) insufficiency: Secondary | ICD-10-CM | POA: Diagnosis not present

## 2024-06-07 DIAGNOSIS — E1169 Type 2 diabetes mellitus with other specified complication: Secondary | ICD-10-CM | POA: Diagnosis not present

## 2024-06-07 DIAGNOSIS — I493 Ventricular premature depolarization: Secondary | ICD-10-CM | POA: Diagnosis not present

## 2024-06-07 DIAGNOSIS — I10 Essential (primary) hypertension: Secondary | ICD-10-CM

## 2024-06-07 DIAGNOSIS — I340 Nonrheumatic mitral (valve) insufficiency: Secondary | ICD-10-CM | POA: Diagnosis not present

## 2024-06-07 DIAGNOSIS — I2510 Atherosclerotic heart disease of native coronary artery without angina pectoris: Secondary | ICD-10-CM

## 2024-06-07 NOTE — Patient Instructions (Addendum)
 Medication Instructions:  Your physician recommends that you continue on your current medications as directed. Please refer to the Current Medication list given to you today.   *If you need a refill on your cardiac medications before your next appointment, please call your pharmacy*  Lab Work: No labs ordered today   Testing/Procedures: No test ordered today   Follow-Up: At [facility], you and your health needs are our priority.  As part of our continuing mission to provide you with exceptional heart care, our providers are all part of one team.  This team includes your primary Cardiologist (physician) and Advanced Practice Providers or APPs (Physician Assistants and Nurse Practitioners) who all work together to provide you with the care you need, when you need it.  Your next appointment:   1 year(s)  Provider:   You may see Yvonne Kendall, MD or one of the following Advanced Practice Providers on your designated Care Team:   Nicolasa Ducking, NP Ames Dura, PA-C Eula Listen, PA-C Cadence Grayson, PA-C Charlsie Quest, NP Carlos Levering, NP    We recommend signing up for the patient portal called "MyChart".  Sign up information is provided on this After Visit Summary.  MyChart is used to connect with patients for Virtual Visits (Telemedicine).  Patients are able to view lab/test results, encounter notes, upcoming appointments, etc.  Non-urgent messages can be sent to your provider as well.   To learn more about what you can do with MyChart, go to [email or web address].

## 2024-06-07 NOTE — Progress Notes (Signed)
 Cardiology Office Note:  .   Date:  06/07/2024  ID:  Vanessa Higgins, DOB Nov 03, 1951, MRN [id number] PCP: Calista Catching, FNP  [facility] Providers Cardiologist:  Sammy Crisp, MD     History of Present Illness: Vanessa Higgins is a 73 y.o. female nonobstructive coronary artery disease by CTA in 03/2023, frequent PVCs, mitral regurgitation, aortic insufficiency, right MCA aneurysm, hypertension, hyperlipidemia, type 2 diabetes mellitus, asthma, obstructive sleep apnea on CPAP, and GERD, who presents for follow-up of nonobstructive coronary artery disease, PVCs, and mitral/aortic regurgitation.  She was last seen in our office a year ago for follow-up of preceding chest pain that led to ED visit in 02/2023.  Subsequent coronary CTA showed nonobstructive CAD with 25 to 49% stenosis in the proximal LAD and minimal luminal irregularities in the LCx.  She was chest pain-free at the time of her last visit; no further testing or intervention was pursued.  Today, Vanessa Higgins reports that she has been feeling well without chest pain, shortness of breath, lightheadedness, or edema.  She has rare skipped beats that are self-limited and without associated symptoms.  She notes that her home blood pressures are always less than 130 mmHg systolic, with diastolic readings typically in the 50s or 60s.  She does not exercise regularly but stays quite active at work and at home.  She notes that her blood sugars are little bit better than in the past.  She is on a low-dose of semaglutide  but has not tolerated further escalation due to GI side effects.  ROS: See HPI  Studies Reviewed: Aaron Aas   EKG Interpretation Date/Time:  Monday June 07 2024 14:28:41 EDT Ventricular Rate:  75 PR Interval:  140 QRS Duration:  82 QT Interval:  408 QTC Calculation: 455 R Axis:   -21  Text Interpretation: Normal sinus rhythm Low voltage QRS Cannot rule out Anterior infarct (cited on or before 15-Jun-2012) Abnormal ECG When  compared with ECG of 22-Apr-2023 No significant change was found Confirmed by Kentarius Partington, Veryl Gottron [phone]) on 06/07/2024 6:57:58 PM    Coronary CTA (05/15/2023): Mild, nonobstructive CAD with 25-49% stenosis in the proximal LAD.  Nondominant LCx with less than 25% proximal stenosis.  RCA normal.  Coronary calcium  score 615 (91st percentile).  No significant extracardiac findings.  Risk Assessment/Calculations:             Physical Exam:   VS:  BP (!) 112/58 (BP Location: Left Arm, Patient Position: Sitting, Cuff Size: Large)   Pulse 75   Ht 5\' 3"  (1.6 m)   Wt 197 lb (89.4 kg)   SpO2 92%   BMI 34.90 kg/m    Wt Readings from Last 3 Encounters:  06/07/24 197 lb (89.4 kg)  06/02/24 191 lb 6.4 oz (86.8 kg)  04/22/24 192 lb 6.4 oz (87.3 kg)    General:  NAD. Neck: No JVD or HJR. Lungs: Clear to auscultation bilaterally without wheezes or crackles. Heart: Regular rate and rhythm without murmurs, rubs, or gallops. Abdomen: Soft, nontender, nondistended. Extremities: No lower extremity edema.  ASSESSMENT AND PLAN: .    Coronary artery disease: Vanessa Higgins has been without further chest pain in the setting of mild, nonobstructive CAD based on coronary CTA last year.  Continue secondary prevention with aspirin  and atorvastatin .  LDL reasonable at 95 on last check a year ago.  Repeat labs scheduled through Vanessa Higgins's office later this month.  Aortic and mitral valve regurgitation: No signs or symptoms of  heart failure.  MR and AI noted to be mild on echo last year.  Continue clinical follow-up with plans to repeat echocardiogram in about 2 years.  Frequent PVCs: Rare palpitations reported.  ECG today without ectopy.  Given lack of symptoms and normal LVEF, no further testing/intervention is indicated at this time.  Hypertension: Blood pressure well-controlled today and at home.  No medication changes.  Hyperlipidemia associated with type 2 diabetes mellitus: LDL reasonable on last check at  95 in 05/2023.  Continue moderate intensity atorvastatin  for now with repeat lipid panel scheduled through Vanessa Higgins's office later this month.  Ongoing management of DM per Vanessa Higgins.    Dispo: Return to clinic in 1 year.  Signed, Sammy Crisp, MD

## 2024-06-10 ENCOUNTER — Ambulatory Visit: Payer: PPO | Admitting: Pulmonary Disease

## 2024-06-10 ENCOUNTER — Encounter: Payer: Self-pay | Admitting: Pulmonary Disease

## 2024-06-10 VITALS — BP 112/64 | HR 75 | Temp 98.4°F | Ht 63.0 in | Wt 192.8 lb

## 2024-06-10 DIAGNOSIS — G4733 Obstructive sleep apnea (adult) (pediatric): Secondary | ICD-10-CM

## 2024-06-10 DIAGNOSIS — J3089 Other allergic rhinitis: Secondary | ICD-10-CM | POA: Diagnosis not present

## 2024-06-10 DIAGNOSIS — J4540 Moderate persistent asthma, uncomplicated: Secondary | ICD-10-CM | POA: Diagnosis not present

## 2024-06-10 DIAGNOSIS — J4489 Other specified chronic obstructive pulmonary disease: Secondary | ICD-10-CM

## 2024-06-10 NOTE — Patient Instructions (Signed)
 Continue using Trelegy for now.  We discussed your breathing test today.  We are trying to procure a download from your CPAP.  We will see you in follow-up in 4 months time call sooner should any new problems arise.

## 2024-06-10 NOTE — Progress Notes (Signed)
 Subjective:    Patient ID: Vanessa Higgins, female    DOB: 06-24-1951, 73 y.o.   MRN: [id number]  Patient Care Team: Dineen Rollene MATSU, FNP as PCP - General (Family Medicine) End, Lonni, MD as PCP - Cardiology (Cardiology)  Chief Complaint  Patient presents with   Follow-up    Shortness of breath on exertion. No cough or wheezing.     BACKGROUND/INTERVAL:Vanessa Higgins is a 73 year old lifelong never smoker who presents for follow-up of asthma with chronic asthmatic bronchitis. This is a scheduled visit.  We last saw her on 09 December 2023. She has been on Breztri  and that she is doing well with this medication.  She has not had any asthmatic bronchitis exacerbations since last seen.    HPI Discussed the use of AI scribe software for clinical note transcription with the patient, who gave verbal consent to proceed.  History of Present Illness   The patient presents for follow-up of asthma, asthmatic bronchitis, and obstructive sleep apnea.  Breathing is well-controlled with the current medication regimen, including Breztri , which t she uses as prescribed. No recent exacerbations or decline in lung function have been noted.  Current PFTs performed on 02 June 2024 shows severe obstructive airways disease with a very significant bronchodilator response (28% net change on FEV1) this shows some modest decline from prior PFTs of 2023.  She has a history of obstructive sleep apnea and uses a CPAP machine daily without any issues. Equipment updates are regularly provided by [facility].  Compliance download shows 80% usage of over 4 hours.  AHI 2.8 on therapy.  Patient is on CPAP at 9 cm H2O.  Socially, she lives with their husband and they have raised their daughter's twins. They have a black lab, which is their grandson's pet. Their grandson previously had allergies, particularly to cats, but currently experiences minimal asthma-related issues.  She has not noticed increased asthma exacerbation  with the black lab in the home.  No recent problems with their breathing or issues with their CPAP machine.      DATA 11/01/2021 PFTs: FEV1 1.25 L or 59% predicted, FVC 2.23 L or 79% predicted, FEV1/FVC 56%.  No significant bronchodilator response.  Lung volumes show mild hyperinflation and air trapping.  There is significant reduction in airway resistance postbronchodilator.  Diffusion capacity normal.  Compared to PFTs 05/12/2020, no significant change. 06/02/2024 PFTs: FEV1 1.03 L or 50% predicted, FVC 2.10 L or 76% predicted, FEV1/FVC 49%, there is a dramatic bronchodilator response with FEV1 improving to 1.33 L or 64% predicted for a 28% net change.  Lung volumes show hyperinflation and air trapping.  Diffusion capacity normal.  Consistent with severe obstructive airways disease of the asthmatic type.  Since prior study bronchodilator response is now noted to be significant.  Review of Systems A 10 point review of systems was performed and it is as noted above otherwise negative.   Patient Active Problem List   Diagnosis Date Noted   Aortic valve regurgitation 06/07/2024   Mitral valve insufficiency 06/07/2024   Candiduria 11/09/2023   CAD (coronary artery disease) 06/16/2023   Cerebral aneurysm 08/02/2022   Headache 07/10/2022   Palpitations 03/07/2022   Anxiety and depression    Osteopenia 08/31/2021   Lower respiratory tract infection due to 2019 novel coronavirus 08/2020   Hyperlipidemia associated with type 2 diabetes mellitus (HCC) 07/14/2020   Low serum vitamin B12 07/14/2020   Shortness of breath 05/05/2020   Left foot pain 09/01/2019   H/O  adenomatous polyp of colon 03/03/2019   Total knee replacement status 03/03/2019   Depression, major, single episode, complete remission (HCC) 11/18/2018   HNP (herniated nucleus pulposus), lumbar 02/06/2018   Fatty liver disease, nonalcoholic 10/15/2017   GERD (gastroesophageal reflux disease) 10/15/2017   PVC's (premature  ventricular contractions) 08/01/2017   OSA (obstructive sleep apnea) 04/21/2017   Vertigo 03/26/2017   Irregular heart rate 06/05/2016   Vitamin D  deficiency 12/02/2014   IDA (iron deficiency anemia) 10/20/2014   Routine general medical examination at a health care facility 05/03/2013   Diabetes mellitus (HCC) 07/09/2012   Essential hypertension 07/09/2012   Hyperlipidemia LDL goal <70 07/09/2012   Asthma 07/09/2012   Class 1 obesity due to excess calories with body mass index (BMI) of 33.0 to 33.9 in adult 07/09/2012    Social History   Tobacco Use   Smoking status: Never   Smokeless tobacco: Never  Substance Use Topics   Alcohol use: No    Allergies  Allergen Reactions   Sulfa Antibiotics Hives, Shortness Of Breath and Rash   Lisinopril Cough   Ozempic  (0.25 Or 0.5 Mg-Dose) [Semaglutide (0.25 Or 0.5mg -Dos)]     Nausea, vomiting   Ramipril  Cough    Current Meds  Medication Sig   albuterol  (VENTOLIN  HFA) 108 (90 Base) MCG/ACT inhaler TAKE 2 PUFFS BY MOUTH EVERY 6 HOURS AS NEEDED FOR WHEEZE OR SHORTNESS OF BREATH   aspirin  EC 81 MG tablet Take 1 tablet (81 mg total) by mouth daily. Swallow whole.   atorvastatin  (LIPITOR) 20 MG tablet TAKE 1 TABLET BY MOUTH EVERYDAY AT BEDTIME   BD PEN NEEDLE MICRO U/F 32G X 6 MM MISC USE 1 PEN NEEDLE DAILY   Budeson-Glycopyrrol-Formoterol  (BREZTRI  AEROSPHERE) 160-9-4.8 MCG/ACT AERO Inhale 2 puffs into the lungs in the morning and at bedtime.   Cholecalciferol  (VITAMIN D ) 125 MCG (5000 UT) CAPS Take 5,000 Units by mouth every Monday,Wednesday,Friday, and Sunday at 6 PM AND 10,000 Units every Tuesday, Thursday, and Saturday at 6 PM.   dapagliflozin  propanediol (FARXIGA ) 10 MG TABS tablet Take 1 tablet (10 mg total) by mouth daily before breakfast.   diltiazem  (CARDIZEM  CD) 240 MG 24 hr capsule TAKE 1 CAPSULE BY MOUTH EVERY DAY   diphenoxylate -atropine  (LOMOTIL ) 2.5-0.025 MG tablet Take 1 tablet by mouth every 6 (six) hours as needed for diarrhea  or loose stools.   fluconazole  (DIFLUCAN ) 150 MG tablet Take 1 tablet (150 mg total) by mouth daily.   insulin  glargine (LANTUS  SOLOSTAR) 100 UNIT/ML Solostar Pen INJECT 20 UNITS INTO THE SKIN EVERY MORNING.   loratadine  (CLARITIN ) 10 MG tablet Take 10 mg by mouth at bedtime.    metFORMIN  (GLUCOPHAGE -XR) 500 MG 24 hr tablet TAKE 2 TABLETS BY MOUTH TWICE A DAY   montelukast  (SINGULAIR ) 10 MG tablet TAKE 1 TABLET BY MOUTH EVERYDAY AT BEDTIME   ondansetron  (ZOFRAN -ODT) 4 MG disintegrating tablet Take 1 tablet (4 mg total) by mouth every 6 (six) hours as needed for nausea or vomiting.   ONETOUCH VERIO test strip USE 1 EACH BY OTHER ROUTE AS NEEDED FOR OTHER. FREESTYLE ELITE   pantoprazole  (PROTONIX ) 20 MG tablet TAKE 1 TABLET (20 MG TOTAL) BY MOUTH EVERY MORNING. TAKE 30 MINUTES TO HOUR BEFORE BREAKFAST   Probiotic Product (PROBIOTIC PO) Take 1 capsule by mouth daily.   Semaglutide ,0.25 or 0.5MG /DOS, (OZEMPIC , 0.25 OR 0.5 MG/DOSE,) 2 MG/1.5ML SOPN Inject 0.25 mg into the skin once a week.   telmisartan -hydrochlorothiazide (MICARDIS  HCT) 80-12.5 MG tablet TAKE 1 TABLET BY MOUTH  EVERY DAY   venlafaxine  XR (EFFEXOR -XR) 150 MG 24 hr capsule TAKE 1 CAPSULE BY MOUTH DAILY WITH BREAKFAST.   vitamin B-12 (CYANOCOBALAMIN ) 1000 MCG tablet Take 1,000 mcg by mouth at bedtime.    Immunization History  Administered Date(s) Administered   Fluad Quad(high Dose 65+) 10/02/2019   Influenza, High Dose Seasonal PF 10/06/2023   Influenza-Unspecified 09/02/2014, 10/02/2015, 09/28/2020, 09/28/2021   MMR 07/09/2010   PFIZER Comirnaty (Gray Top)Covid-19 Tri-Sucrose Vaccine 12/02/2019, 01/04/2020, 12/14/2020, 06/01/2021   PFIZER(Purple Top)SARS-COV-2 Vaccination 12/02/2019, 01/04/2020, 12/14/2020   PNEUMOCOCCAL CONJUGATE-20 08/19/2022   Pfizer(Comirnaty )Fall Seasonal Vaccine 12 years and older 10/22/2022, 09/12/2023   Pneumococcal Conjugate-13 12/02/2014   Pneumococcal Polysaccharide-23 07/09/2001, 12/04/2016   Td  07/09/2010   Tdap 08/22/2021   Zoster Recombinant(Shingrix ) 06/29/2022   Zoster, Live 10/27/2013        Objective:     BP 112/64 (BP Location: Left Arm, Patient Position: Sitting, Cuff Size: Normal)   Pulse 75   Temp 98.4 F (36.9 C) (Oral)   Ht 5' 3 (1.6 m)   Wt 192 lb 12.8 oz (87.5 kg)   SpO2 94%   BMI 34.15 kg/m   SpO2: 94 %  GENERAL: This is a well-developed obese woman in no acute respiratory distress.  She is fully ambulatory. No conversational dyspnea. HEAD: Normocephalic, atraumatic. EYES: Pupils equal, round, reactive to light.  No scleral icterus. MOUTH: Dentition intact, oral mucosa moist.  No thrush. NECK: Supple. No thyromegaly. No nodules. No JVD.  Trachea midline.  No crepitus. PULMONARY: Symmetrical air entry.  Moving air well.  No adventitious sounds. CARDIOVASCULAR: S1 and S2. Regular rate and rhythm.  No rubs murmurs gallops heard. GASTROINTESTINAL: Obese abdomen, soft. MUSCULOSKELETAL: No joint deformity, no clubbing, no edema.  Increased AP diameter. NEUROLOGIC: No focal deficits noted.  Speech is fluent.  Awake, alert, oriented. SKIN: Intact,warm,dry, limited exam: No rashes. PSYCH: Normal mood and behavior.       Assessment & Plan:     ICD-10-CM   1. Moderate persistent asthma without complication  J45.40     2. Asthmatic bronchitis , chronic (HCC)  J44.89     3. Perennial allergic rhinitis  J30.89     4. OSA (obstructive sleep apnea)  G47.33      Discussion:    Asthma with asthmatic bronchitis Asthma and asthmatic bronchitis are well-controlled with Breztri . Lung function shows a 28% improvement post-inhaler use, though air trapping is present.  Considering use of Biologics - Continue Breztri  - Consider shot for enhanced asthma control  Obstructive sleep apnea CPAP is used daily without issues. Equipment is managed by [facility], but lacks regular follow-up by a sleep specialist. Referral to a sleep specialist will be made if  issues arise. - Obtain CPAP usage data from [facility] - Refer to sleep specialist if issues arise      Advised if symptoms do not improve or worsen, to please contact office for sooner follow up or seek emergency care.    I spent 32 minutes of dedicated to the care of this patient on the date of this encounter to include pre-visit review of records, face-to-face time with the patient discussing conditions above, post visit ordering of testing, clinical documentation with the electronic health record, making appropriate referrals as documented, and communicating necessary findings to members of the patients care team.     C. Leita Sanders, MD Advanced Bronchoscopy PCCM [facility]    *This note was generated using voice recognition software/Dragon and/or AI transcription program.  Despite best efforts to proofread, errors can occur which can change the meaning. Any transcriptional errors that result from this process are unintentional and may not be fully corrected at the time of dictation.

## 2024-06-16 ENCOUNTER — Telehealth: Payer: Self-pay

## 2024-06-16 NOTE — Telephone Encounter (Signed)
 Spoke to pt informed her  that we have not received her medication as of yet, did provide pt with the # to pt assistance Novo Nordisk at [phone] pt will call and let us  know whether it has been shipped out

## 2024-06-16 NOTE — Telephone Encounter (Signed)
 Copied from CRM [id number]. Topic: Clinical - Medication Question >> Jun 16, 2024  2:11 PM Baldo Levan wrote: Reason for CRM: Patient is calling in stating she is almost out of her medication and is wondering if she has any Semaglutide ,0.25 or 0.5MG /DOS, (OZEMPIC , 0.25 OR 0.5 MG/DOSE,) 2 MG/1.5ML SOPN [[id number]] in the office ready for her to pick up. Please contact the patient with any update and she stated its okay to leave a detailed voicemail.

## 2024-06-16 NOTE — Telephone Encounter (Signed)
 As discussed Please check to see if shipped here If not, please ask pt to call below  Your approval for patient assistance enrollment 2025 for Ozempic  by Novo Nordisk has been approved until 01/23/2025. Below is information for shipping and any other questions you may have for the manufacturer.   PAP: Patient assistance application for Ozempic  has been approved by PAP Companies: NovoNord[employer] from 01-29-2024 to 01-23-2025. Medication should be delivered to PAP Delivery: Provider's office. For further shipping updates, please contact Nov[employer] at [phone]. Patient ID is: [id number]

## 2024-06-21 ENCOUNTER — Encounter: Payer: Self-pay | Admitting: Pulmonary Disease

## 2024-06-21 ENCOUNTER — Telehealth: Payer: Self-pay | Admitting: Family

## 2024-06-21 NOTE — Telephone Encounter (Signed)
    04/22/2024    2:52 PM 04/22/2024    2:50 PM 01/07/2024    9:03 AM  Depression screen PHQ 2/9  Decreased Interest 0 0 0  Down, Depressed, Hopeless 0 0 0  PHQ - 2 Score 0 0 0   Completed medicare form

## 2024-06-23 ENCOUNTER — Ambulatory Visit: Payer: PPO | Admitting: *Deleted

## 2024-06-23 ENCOUNTER — Ambulatory Visit: Payer: PPO | Admitting: Dermatology

## 2024-06-23 VITALS — Ht 63.0 in | Wt 194.0 lb

## 2024-06-23 DIAGNOSIS — Z0000 Encounter for general adult medical examination without abnormal findings: Secondary | ICD-10-CM | POA: Diagnosis not present

## 2024-06-23 NOTE — Patient Instructions (Addendum)
 Vanessa Higgins , Thank you for taking time out of your busy schedule to complete your Annual Wellness Visit with me. I enjoyed our conversation and look forward to speaking with you again next year. I, as well as your care team,  appreciate your ongoing commitment to your health goals. Please review the following plan we discussed and let me know if I can assist you in the future. Your Game plan/ To Do List    Referrals: If you haven't heard from the office you've been referred to, please reach out to them at the phone provided.  Remember to update your flu and covid vaccines annually Follow up Visits: Next Medicare AWV with our clinical staff: 06/28/25 @ 3:40   Have you seen your provider in the last 6 months (3 months if uncontrolled diabetes)? Yes Next Office Visit with your provider: 07/14/24  Clinician Recommendations:  Aim for 30 minutes of exercise or brisk walking, 6-8 glasses of water, and 5 servings of fruits and vegetables each day.       This is a list of the screening recommended for you and due dates:  Health Maintenance  Topic Date Due   Eye exam for diabetics  01/31/2024   COVID-19 Vaccine (10 - 2024-25 season) 03/11/2024   Flu Shot  07/30/2024   Yearly kidney health urinalysis for diabetes  10/06/2024   Hemoglobin A1C  10/22/2024   Colon Cancer Screening  12/07/2024   Yearly kidney function blood test for diabetes  01/06/2025   Complete foot exam   04/22/2025   Medicare Annual Wellness Visit  06/23/2025   Mammogram  10/20/2025   DTaP/Tdap/Td vaccine (3 - Td or Tdap) 08/23/2031   Pneumococcal Vaccine for age over 26  Completed   DEXA scan (bone density measurement)  Completed   Hepatitis C Screening  Completed   Zoster (Shingles) Vaccine  Completed   Hepatitis B Vaccine  Aged Out   HPV Vaccine  Aged Out   Meningitis B Vaccine  Aged Out    Advanced directives: (Copy Requested) Please bring a copy of your health care power of attorney and living will to the office to be  added to your chart at your convenience. You can mail to [facility] [address] [place], [place] [address] or email to [email or web address] Advance Care Planning is important because it:  [x]  Makes sure you receive the medical care that is consistent with your values, goals, and preferences  [x]  It provides guidance to your family and loved ones and reduces their decisional burden about whether or not they are making the right decisions based on your wishes.

## 2024-06-23 NOTE — Progress Notes (Signed)
 Subjective:   Vanessa Higgins is a 73 y.o. who presents for a Medicare Wellness preventive visit.  As a reminder, Annual Wellness Visits don't include a physical exam, and some assessments may be limited, especially if this visit is performed virtually. We may recommend an in-person follow-up visit with your provider if needed.  Visit Complete: Virtual I connected with  Vanessa Higgins on 06/23/24 by a audio enabled telemedicine application and verified that I am speaking with the correct person using two identifiers.  Patient Location: Home  Provider Location: Home Office  I discussed the limitations of evaluation and management by telemedicine. The patient expressed understanding and agreed to proceed.  Vital Signs: Because this visit was a virtual/telehealth visit, some criteria may be missing or patient reported. Any vitals not documented were not able to be obtained and vitals that have been documented are patient reported.  VideoDeclined- This patient declined Librarian, academic. Therefore the visit was completed with audio only.  Persons Participating in Visit: Patient.  AWV Questionnaire: Yes: Patient Medicare AWV questionnaire was completed by the patient on 06/22/24; I have confirmed that all information answered by patient is correct and no changes since this date.  Cardiac Risk Factors include: advanced age (>40men, >28 women);diabetes mellitus;hypertension;dyslipidemia;obesity (BMI >30kg/m2)     Objective:    Today's Vitals   06/23/24 1548  Weight: 194 lb (88 kg)  Height: 5' 3 (1.6 m)   Body mass index is 34.37 kg/m.     06/23/2024    3:59 PM 06/19/2023    3:35 PM 03/05/2023    6:36 AM 01/07/2022    9:15 AM 11/01/2020    1:52 PM 09/18/2020    3:40 PM 06/28/2019    9:36 AM  Advanced Directives  Does Patient Have a Medical Advance Directive? Yes No Yes Yes Yes Yes Yes  Type of Estate agent of [place];Living will   Healthcare Power of [place];Living will Healthcare Power of [place];Living will Healthcare Power of [place];Living will  Healthcare Power of [place];Living will  Does patient want to make changes to medical advance directive?   No - Patient declined No - Patient declined No - Patient declined  No - Patient declined   Copy of Healthcare Power of Attorney in Chart? No - copy requested  Yes - validated most recent copy scanned in chart (See row information) Yes - validated most recent copy scanned in chart (See row information) Yes - validated most recent copy scanned in chart (See row information)    Would patient like information on creating a medical advance directive?  No - Patient declined          Data saved with a previous flowsheet row definition    Current Medications (verified) Outpatient Encounter Medications as of 06/23/2024  Medication Sig   albuterol  (VENTOLIN  HFA) 108 (90 Base) MCG/ACT inhaler TAKE 2 PUFFS BY MOUTH EVERY 6 HOURS AS NEEDED FOR WHEEZE OR SHORTNESS OF BREATH   aspirin  EC 81 MG tablet Take 1 tablet (81 mg total) by mouth daily. Swallow whole.   atorvastatin  (LIPITOR) 20 MG tablet TAKE 1 TABLET BY MOUTH EVERYDAY AT BEDTIME   BD PEN NEEDLE MICRO U/F 32G X 6 MM MISC USE 1 PEN NEEDLE DAILY   Budeson-Glycopyrrol-Formoterol  (BREZTRI  AEROSPHERE) 160-9-4.8 MCG/ACT AERO Inhale 2 puffs into the lungs in the morning and at bedtime.   Cholecalciferol  (VITAMIN D ) 125 MCG (5000 UT) CAPS Take 5,000 Units by mouth every Monday,Wednesday,Friday, and Sunday at  6 PM AND 10,000 Units every Tuesday, Thursday, and Saturday at 6 PM.   dapagliflozin  propanediol (FARXIGA ) 10 MG TABS tablet Take 1 tablet (10 mg total) by mouth daily before breakfast.   diltiazem  (CARDIZEM  CD) 240 MG 24 hr capsule TAKE 1 CAPSULE BY MOUTH EVERY DAY   diphenoxylate -atropine  (LOMOTIL ) 2.5-0.025 MG tablet Take 1 tablet by mouth every 6 (six) hours as needed for diarrhea or loose stools.   insulin  glargine (LANTUS   SOLOSTAR) 100 UNIT/ML Solostar Pen INJECT 20 UNITS INTO THE SKIN EVERY MORNING.   loratadine  (CLARITIN ) 10 MG tablet Take 10 mg by mouth at bedtime.    metFORMIN  (GLUCOPHAGE -XR) 500 MG 24 hr tablet TAKE 2 TABLETS BY MOUTH TWICE A DAY   montelukast  (SINGULAIR ) 10 MG tablet TAKE 1 TABLET BY MOUTH EVERYDAY AT BEDTIME   ondansetron  (ZOFRAN -ODT) 4 MG disintegrating tablet Take 1 tablet (4 mg total) by mouth every 6 (six) hours as needed for nausea or vomiting.   ONETOUCH VERIO test strip USE 1 EACH BY OTHER ROUTE AS NEEDED FOR OTHER. FREESTYLE ELITE   pantoprazole  (PROTONIX ) 20 MG tablet TAKE 1 TABLET (20 MG TOTAL) BY MOUTH EVERY MORNING. TAKE 30 MINUTES TO HOUR BEFORE BREAKFAST   Probiotic Product (PROBIOTIC PO) Take 1 capsule by mouth daily.   Semaglutide ,0.25 or 0.5MG /DOS, (OZEMPIC , 0.25 OR 0.5 MG/DOSE,) 2 MG/1.5ML SOPN Inject 0.25 mg into the skin once a week.   telmisartan -hydrochlorothiazide (MICARDIS  HCT) 80-12.5 MG tablet TAKE 1 TABLET BY MOUTH EVERY DAY   venlafaxine  XR (EFFEXOR -XR) 150 MG 24 hr capsule TAKE 1 CAPSULE BY MOUTH DAILY WITH BREAKFAST.   vitamin B-12 (CYANOCOBALAMIN ) 1000 MCG tablet Take 1,000 mcg by mouth at bedtime.   fluconazole  (DIFLUCAN ) 150 MG tablet Take 1 tablet (150 mg total) by mouth daily. (Patient not taking: Reported on 06/23/2024)   No facility-administered encounter medications on file as of 06/23/2024.    Allergies (verified) Sulfa antibiotics, Lisinopril, Ozempic  (0.25 or 0.5 mg-dose) [semaglutide (0.25 or 0.5mg -dos)], and Ramipril    History: Past Medical History:  Diagnosis Date   Allergy    hay fever   Anemia    Ankle fracture, left 04/08/2014   Anxiety and depression    Arthritis    Asthma    well controlled    Back pain    Blood transfusion abn reaction or complication, no procedure mishap    Blood transfusion without reported diagnosis    Cerebral aneurysm    Chest pain    COPD (chronic obstructive pulmonary disease) (HCC)    Coronary artery  disease    COVID-19 virus infection 08/2020   Depression    Diabetes mellitus    Disc displacement, lumbar    Dysrhythmia    pvc's   Fatty liver    GERD (gastroesophageal reflux disease)    Headache(784.0)    History of hiatal hernia    small   Hyperlipidemia    Hypertension    Joint pain    Low vitamin B12 level    Low vitamin D  level    Migraine    Mitral valve prolapse    Obstructive sleep apnea    CPAP   Osteoarthritis    Palpitations    PONV (postoperative nausea and vomiting)    h/o in the past   Sleep apnea 04/06/2017   uses CPAP   SOB (shortness of breath)    UTI (lower urinary tract infection)    Vertigo    1 episode, several years ago   Vitamin B 12 deficiency  Vitamin D  deficiency    Wears glasses    Past Surgical History:  Procedure Laterality Date   APPENDECTOMY  July 03, 1976   CATARACT EXTRACTION W/PHACO Right 02/19/2023   Procedure: CATARACT EXTRACTION PHACO AND INTRAOCULAR LENS PLACEMENT (IOC) RIGHT DIABETIC  7.13  00:46.7;  Surgeon: Mittie Gaskin, MD;  Location: [facility];  Service: Ophthalmology;  Laterality: Right;  Diabetic   CATARACT EXTRACTION W/PHACO Left 03/05/2023   Procedure: CATARACT EXTRACTION PHACO AND INTRAOCULAR LENS PLACEMENT (IOC) LEFT DIABETIC  6.03  00:36.4;  Surgeon: Mittie Gaskin, MD;  Location: [facility];  Service: Ophthalmology;  Laterality: Left;  Diabetic   COLONOSCOPY  last 11/17/2014   INGUINAL HERNIA REPAIR Right 11/01/2020   Procedure: HERNIA REPAIR INGUINAL ADULT;  Surgeon: Dessa Reyes ORN, MD;  Location: [facility];  Service: General;  Laterality: Right;   JOINT REPLACEMENT     KNEE ARTHROPLASTY Right 03/03/2019   Procedure: COMPUTER ASSISTED TOTAL KNEE ARTHROPLASTY-RIGHT;  Surgeon: Mardee Lynwood SQUIBB, MD;  Location: [facility];  Service: Orthopedics;  Laterality: Right;   LUMBAR LAMINECTOMY/DECOMPRESSION MICRODISCECTOMY Right 02/06/2018   Procedure: Lumbar two Hemilaminectomy for Discectomy;  Surgeon:  Gillie Duncans, MD;  Location: [facility];  Service: Neurosurgery;  Laterality: Right;   ORIF ANKLE FRACTURE Left 04/08/2014   Procedure: OPEN REDUCTION INTERNAL FIXATION (ORIF) ANKLE FRACTURE LEFT ANKLE FRACTURE OPEN TREATMENT BIMALLEOLAR ANKLE INCLUDES INTERNAL FIXATION;  Surgeon: Fonda SQUIBB Olmsted, MD;  Location: [facility];  Service: Orthopedics;  Laterality: Left;   PAROTIDECTOMY  1980   left   POLYPECTOMY     TONSILLECTOMY AND ADENOIDECTOMY     as a child   TOTAL KNEE ARTHROPLASTY Bilateral    left   TUBAL LIGATION  03-Jul-1976   Family History  Problem Relation Age of Onset   Sudden death Mother    Cancer Mother 72       pancreatic   Heart disease Father 55   Sudden death Father    Cancer Sister 65       brain   Cancer Brother        colon   Colon cancer Brother 50   Stroke Maternal Grandmother    Heart disease Maternal Grandfather    Aneurysm Paternal Aunt    Breast cancer Neg Hx    Colon polyps Neg Hx    Esophageal cancer Neg Hx    Rectal cancer Neg Hx    Stomach cancer Neg Hx    Thyroid  cancer Neg Hx    Social History   Socioeconomic History   Marital status: Widowed    Spouse name: Not on file   Number of children: Not on file   Years of education: Not on file   Highest education level: Associate degree: occupational, Scientist, product/process development, or vocational program  Occupational History   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 Husband deceased 07-03-2008.      Work - [employer] endoscopy- [employer]   Diet - healthy   Exercise - gym 2 x per week   Social Drivers of Health   Financial Resource Strain: Low Risk  (06/22/2024)   Overall  Financial  Resource Strain (CARDIA)    Difficulty of Paying Living Expenses: Not hard at all  Food Insecurity: No Food Insecurity (06/22/2024)   Hunger Vital Sign    Worried About Running Out of Food in the Last Year: Never true    Ran Out of Food in the Last Year: Never true  Transportation Needs: No Transportation Needs (06/22/2024)   PRAPARE - Administrator, Civil Service (Medical): No    Lack of Transportation (Non-Medical): No  Physical Activity: Insufficiently Active (06/22/2024)   Exercise Vital Sign    Days of Exercise per Week: 2 days    Minutes of Exercise per Session: 20 min  Stress: No Stress Concern Present (06/22/2024)   [employer] of Occupational Health - Occupational Stress Questionnaire    Feeling of Stress: Not at all  Social Connections: Moderately Integrated (06/22/2024)   Social Connection and Isolation Panel    Frequency of Communication with Friends and Family: More than three times a week    Frequency of Social Gatherings with Friends and Family: More than three times a week    Attends Religious Services: More than 4 times per year    Active Member of [employer] or Organizations: Yes    Attends Banker Meetings: 1 to 4 times per year    Marital Status: Widowed    Tobacco Counseling Counseling given: Not Answered    Clinical Intake:  Pre-visit preparation completed: Yes  Pain : No/denies pain     BMI - recorded: 34.37 Nutritional Status: BMI > 30  Obese Nutritional Risks: None Diabetes: Yes CBG done?: Yes (per patient 2 hours after lunch 160)  Lab Results  Component Value Date   HGBA1C 7.6 (A) 04/22/2024   HGBA1C 7.3 (A) 01/07/2024   HGBA1C 6.9 (A) 10/07/2023     How often do you need to have someone help you when you read instructions, pamphlets, or other written materials from your doctor or pharmacy?: 1 - Never  Interpreter Needed?: No  Information entered by :: R. Ethelyne Erich LPN   Activities of Daily Living     06/22/2024     9:17 AM  In your present state of health, do you have any difficulty performing the following activities:  Hearing? 0  Vision? 0  Difficulty concentrating or making decisions? 0  Walking or climbing stairs? 0  Dressing or bathing? 0  Doing errands, shopping? 0  Preparing Food and eating ? N  Using the Toilet? N  In the past six months, have you accidently leaked urine? Y  Do you have problems with loss of bowel control? Y  Managing your Medications? N  Managing your Finances? N  Housekeeping or managing your Housekeeping? N    Patient Care Team: Dineen Rollene MATSU, FNP as PCP - General (Family Medicine) End, Lonni, MD as PCP - Cardiology (Cardiology)  I have updated your Care Teams any recent Medical Services you may have received from other providers in the past year.     Assessment:   This is a routine wellness examination for [place].  Hearing/Vision screen Hearing Screening - Comments:: No issues Vision Screening - Comments:: No correction   Goals Addressed             This Visit's Progress    Patient Stated       Wants to exercise more       Depression Screen     06/23/2024    3:56 PM 04/22/2024    2:52  PM 04/22/2024    2:50 PM 01/07/2024    9:03 AM 11/07/2023    4:30 PM 10/07/2023    9:49 AM 06/19/2023    3:33 PM  PHQ 2/9 Scores  PHQ - 2 Score 0 0 0 0 0 0 0  PHQ- 9 Score 0    0      Fall Risk     06/22/2024    9:17 AM 04/22/2024    2:50 PM 04/22/2024    2:49 PM 01/07/2024    9:03 AM 11/07/2023    4:30 PM  Fall Risk   Falls in the past year? 1 0 0 0 0  Number falls in past yr: 0 0 0 0 0  Injury with Fall? 0 0 0 0 0  Risk for fall due to : History of fall(s);Impaired balance/gait No Fall Risks No Fall Risks No Fall Risks No Fall Risks  Follow up Falls evaluation completed;Falls prevention discussed Falls evaluation completed Falls evaluation completed Falls evaluation completed Falls evaluation completed    MEDICARE RISK AT HOME:  Medicare Risk at  Home Any stairs in or around the home?: (Patient-Rptd) Yes If so, are there any without handrails?: (Patient-Rptd) No Home free of loose throw rugs in walkways, pet beds, electrical cords, etc?: (Patient-Rptd) Yes Adequate lighting in your home to reduce risk of falls?: (Patient-Rptd) Yes Life alert?: (Patient-Rptd) No Use of a cane, walker or w/c?: (Patient-Rptd) No Grab bars in the bathroom?: (Patient-Rptd) Yes Shower chair or bench in shower?: (Patient-Rptd) No Elevated toilet seat or a handicapped toilet?: (Patient-Rptd) No  TIMED UP AND GO:  Was the test performed?  No  Cognitive Function: 6CIT completed        06/23/2024    4:00 PM 06/19/2023    3:38 PM  6CIT Screen  What Year? 0 points 0 points  What month? 0 points 0 points  What time? 0 points 0 points  Count back from 20 0 points 0 points  Months in reverse 0 points 0 points  Repeat phrase 0 points 0 points  Total Score 0 points 0 points    Immunizations Immunization History  Administered Date(s) Administered   Fluad Quad(high Dose 65+) 10/02/2019   Influenza, High Dose Seasonal PF 10/06/2023   Influenza-Unspecified 09/02/2014, 10/02/2015, 09/28/2020, 09/28/2021   MMR 07/09/2010   PFIZER Comirnaty (Gray Top)Covid-19 Tri-Sucrose Vaccine 12/02/2019, 01/04/2020, 12/14/2020, 06/01/2021   PFIZER(Purple Top)SARS-COV-2 Vaccination 12/02/2019, 01/04/2020, 12/14/2020   PNEUMOCOCCAL CONJUGATE-20 08/19/2022   Pfizer(Comirnaty )Fall Seasonal Vaccine 12 years and older 10/22/2022, 09/12/2023   Pneumococcal Conjugate-13 12/02/2014   Pneumococcal Polysaccharide-23 07/09/2001, 12/04/2016   Td 07/09/2010   Tdap 08/22/2021   Zoster Recombinant(Shingrix ) 06/29/2022   Zoster, Live 10/27/2013    Screening Tests Health Maintenance  Topic Date Due   Zoster Vaccines- Shingrix  (2 of 2) 08/24/2022   OPHTHALMOLOGY EXAM  01/31/2024   COVID-19 Vaccine (10 - 2024-25 season) 03/11/2024   Medicare Annual Wellness (AWV)  06/18/2024    INFLUENZA VACCINE  07/30/2024   Diabetic kidney evaluation - Urine ACR  10/06/2024   HEMOGLOBIN A1C  10/22/2024   Colonoscopy  12/07/2024   Diabetic kidney evaluation - eGFR measurement  01/06/2025   FOOT EXAM  04/22/2025   MAMMOGRAM  10/20/2025   DTaP/Tdap/Td (3 - Td or Tdap) 08/23/2031   Pneumococcal Vaccine: 50+ Years  Completed   DEXA SCAN  Completed   Hepatitis C Screening  Completed   Hepatitis B Vaccines  Aged Out   HPV VACCINES  Aged Out  Meningococcal B Vaccine  Aged Out    Health Maintenance  Health Maintenance Due  Topic Date Due   Zoster Vaccines- Shingrix  (2 of 2) 08/24/2022   OPHTHALMOLOGY EXAM  01/31/2024   COVID-19 Vaccine (10 - 2024-25 season) 03/11/2024   Medicare Annual Wellness (AWV)  06/18/2024   Health Maintenance Items Addressed: Discussed the need to have flu and covid vaccines annually  Additional Screening:  Vision Screening: Recommended annual ophthalmology exams for early detection of glaucoma and other disorders of the eye.Up to date [facility]   Will request last diabetic eye exam office notes Would you like a referral to an eye doctor? No    Dental Screening: Recommended annual dental exams for proper oral hygiene  Community Resource Referral / Chronic Care Management: CRR required this visit?  No   CCM required this visit?  No   Plan:    I have personally reviewed and noted the following in the patient's chart:   Medical and social history Use of alcohol, tobacco or illicit drugs  Current medications and supplements including opioid prescriptions. Patient is not currently taking opioid prescriptions. Functional ability and status Nutritional status Physical activity Advanced directives List of other physicians Hospitalizations, surgeries, and ER visits in previous 12 months Vitals Screenings to include cognitive, depression, and falls Referrals and appointments  In addition, I have reviewed and discussed with patient  certain preventive protocols, quality metrics, and best practice recommendations. A written personalized care plan for preventive services as well as general preventive health recommendations were provided to patient.   Angeline Fredericks, LPN   [id number]   After Visit Summary: (MyChart) Due to this being a telephonic visit, the after visit summary with patients personalized plan was offered to patient via MyChart   Notes: Nothing significant to report at this time.

## 2024-06-25 ENCOUNTER — Emergency Department
Admission: EM | Admit: 2024-06-25 | Discharge: 2024-06-25 | Disposition: A | Discharge status: Home / Self Care | Attending: Emergency Medicine | Admitting: Emergency Medicine

## 2024-06-25 ENCOUNTER — Other Ambulatory Visit: Payer: Self-pay

## 2024-06-25 ENCOUNTER — Emergency Department

## 2024-06-25 DIAGNOSIS — K838 Other specified diseases of biliary tract: Secondary | ICD-10-CM | POA: Diagnosis not present

## 2024-06-25 DIAGNOSIS — E119 Type 2 diabetes mellitus without complications: Secondary | ICD-10-CM | POA: Insufficient documentation

## 2024-06-25 DIAGNOSIS — K8020 Calculus of gallbladder without cholecystitis without obstruction: Secondary | ICD-10-CM | POA: Insufficient documentation

## 2024-06-25 DIAGNOSIS — K828 Other specified diseases of gallbladder: Secondary | ICD-10-CM | POA: Diagnosis not present

## 2024-06-25 DIAGNOSIS — I2510 Atherosclerotic heart disease of native coronary artery without angina pectoris: Secondary | ICD-10-CM | POA: Diagnosis not present

## 2024-06-25 DIAGNOSIS — R1011 Right upper quadrant pain: Secondary | ICD-10-CM | POA: Diagnosis not present

## 2024-06-25 DIAGNOSIS — I10 Essential (primary) hypertension: Secondary | ICD-10-CM | POA: Insufficient documentation

## 2024-06-25 DIAGNOSIS — R1013 Epigastric pain: Secondary | ICD-10-CM | POA: Diagnosis not present

## 2024-06-25 LAB — CBC
HCT: 41.8 % (ref 36.0–46.0)
Hemoglobin: 12.8 g/dL (ref 12.0–15.0)
MCH: 28.8 pg (ref 26.0–34.0)
MCHC: 30.6 g/dL (ref 30.0–36.0)
MCV: 93.9 fL (ref 80.0–100.0)
Platelets: 293 10*3/uL (ref 150–400)
RBC: 4.45 MIL/uL (ref 3.87–5.11)
RDW: 15 % (ref 11.5–15.5)
WBC: 9.8 10*3/uL (ref 4.0–10.5)
nRBC: 0 % (ref 0.0–0.2)

## 2024-06-25 LAB — URINALYSIS, ROUTINE W REFLEX MICROSCOPIC
Bilirubin Urine: NEGATIVE
Glucose, UA: >=500 mg/dL — AB
Hgb urine dipstick: NEGATIVE
Ketones, ur: 5 mg/dL — AB
Leukocytes,Ua: NEGATIVE
Nitrite: POSITIVE — AB
Protein, ur: NEGATIVE mg/dL
Specific Gravity, Urine: 1.027 (ref 1.005–1.030)
pH: 5 (ref 5.0–8.0)

## 2024-06-25 LAB — COMPREHENSIVE METABOLIC PANEL WITH GFR
ALT: 25 U/L (ref 0–44)
AST: 32 U/L (ref 15–41)
Albumin: 3.8 g/dL (ref 3.5–5.0)
Alkaline Phosphatase: 42 U/L (ref 38–126)
Anion gap: 12 (ref 5–15)
BUN: 17 mg/dL (ref 8–23)
CO2: 22 mmol/L (ref 22–32)
Calcium: 9.5 mg/dL (ref 8.9–10.3)
Chloride: 104 mmol/L (ref 98–111)
Creatinine, Ser: 0.74 mg/dL (ref 0.44–1.00)
GFR, Estimated: >60 mL/min (ref >60)
Glucose, Bld: 225 mg/dL — ABNORMAL HIGH (ref 70–99)
Potassium: 4 mmol/L (ref 3.5–5.1)
Sodium: 138 mmol/L (ref 135–145)
Total Bilirubin: 0.9 mg/dL (ref 0.0–1.2)
Total Protein: 7.3 g/dL (ref 6.5–8.1)

## 2024-06-25 LAB — TROPONIN I (HIGH SENSITIVITY): Troponin I (High Sensitivity): 4 ng/L (ref <18)

## 2024-06-25 LAB — LIPASE, BLOOD: Lipase: 41 U/L (ref 11–51)

## 2024-06-25 MED ORDER — SODIUM CHLORIDE 0.9 % IV BOLUS
500.0000 mL | Freq: Once | INTRAVENOUS | Status: AC
  Administered 2024-06-25: 500 mL via INTRAVENOUS

## 2024-06-25 MED ORDER — ONDANSETRON HCL 4 MG/2ML IJ SOLN
4.0000 mg | Freq: Once | INTRAMUSCULAR | Status: AC
  Administered 2024-06-25: 4 mg via INTRAVENOUS
  Filled 2024-06-25: qty 2

## 2024-06-25 MED ORDER — ONDANSETRON 4 MG PO TBDP
4.0000 mg | ORAL_TABLET | Freq: Three times a day (TID) | ORAL | 0 refills | Status: AC | PRN
Start: 2024-06-25 — End: ?

## 2024-06-25 MED ORDER — OXYCODONE-ACETAMINOPHEN 5-325 MG PO TABS: 1.0000 | ORAL_TABLET | ORAL | 0 refills | Status: AC | PRN

## 2024-06-25 MED ORDER — MORPHINE SULFATE (PF) 4 MG/ML IV SOLN
4.0000 mg | Freq: Once | INTRAVENOUS | Status: AC
  Administered 2024-06-25: 4 mg via INTRAVENOUS
  Filled 2024-06-25: qty 1

## 2024-06-25 NOTE — Discharge Instructions (Addendum)
 You are seen in the emergency department for right upper abdominal pain.  Your lab work was overall normal.  Your heart enzymes were normal, do not believe you are having a heart attack today.  You had an ultrasound of your gallbladder that showed gallstones.  Your pain improved after some pain medication.  Your gallbladder did not appear infected today.  You are given information for a gallbladder eating plan.  Avoid fatty foods.  You can take Tylenol  for pain control.  If you are still having severe pain you were given a prescription for narcotic pain medication.  You are also given a prescription for nausea medication.  If your pain returns you can try 1 pain medication if that does not improve your pain return to the emergency department for reevaluation.  If you develop fever return to the ER.  You are given information to follow-up with general surgery, call to make an appointment next week.  Call your primary care physician for close follow-up.  You were given a prescription for narcotic pain medications.  Take only if in severe pain.  These are very addictive medications.  These medications can make you constipated.  If you need to take more than 1-2 doses, start a stool softner.  If you become constipated, take 1 capfull of MiraLAX, can repeat untill having regular bowel movements.  Keep this medication out of reach of any children.  zofran  (ondansetron ) - nausea medication, take 1 tablet every 8 hours as needed for nausea/vomiting.

## 2024-06-25 NOTE — ED Provider Notes (Signed)
 [facility] Provider Note    Event Date/Time   First MD Initiated Contact with Patient 06/25/24 0809     (approximate)   History   Abdominal Pain   HPI  Vanessa Higgins is a 73 y.o. female past medical history significant for nonobstructive coronary artery disease, frequent PVCs, mitral regurgitation, aortic insufficiency, right MCA aneurysm, hypertension, hyperlipidemia, diabetes, OSA, GERD, who presents to the emergency department with right-sided abdominal pain.  Woke up from sleep with right upper abdominal pain associated with nausea and vomiting.  Does endorse ongoing diarrhea but states that this is not new for her.  Denies any melena or blood in her stool.  Prior tubal ligation.  No prior cholecystectomy.  Denies significant alcohol use.  Denies any chest pain or shortness of breath.  Denies burning with urination, urinary urgency or frequency.     Physical Exam   Triage Vital Signs: ED Triage Vitals  Encounter Vitals Group     BP 06/25/24 0728 (!) 153/70     Girls Systolic BP Percentile --      Girls Diastolic BP Percentile --      Boys Systolic BP Percentile --      Boys Diastolic BP Percentile --      Pulse Rate 06/25/24 0728 65     Resp 06/25/24 0728 18     Temp 06/25/24 0728 98.4 F (36.9 C)     Temp Source 06/25/24 0728 Oral     SpO2 06/25/24 0728 100 %     Weight 06/25/24 0731 194 lb (88 kg)     Height 06/25/24 0731 5' 3 (1.6 m)     Head Circumference --      Peak Flow --      Pain Score 06/25/24 0731 8     Pain Loc --      Pain Education --      Exclude from Growth Chart --     Most recent vital signs: Vitals:   06/25/24 1130 06/25/24 1200  BP: (!) 120/54 (!) 126/53  Pulse: 68 64  Resp: 16 16  Temp: 98.4 F (36.9 C)   SpO2: 97% 94%    Physical Exam Constitutional:      Appearance: She is well-developed.  HENT:     Head: Atraumatic.   Eyes:     Conjunctiva/sclera: Conjunctivae normal.    Cardiovascular:      Rate and Rhythm: Regular rhythm.  Pulmonary:     Effort: No respiratory distress.  Abdominal:     General: There is no distension.     Tenderness: There is abdominal tenderness in the right upper quadrant. Negative signs include Rovsing's sign and McBurney's sign.   Musculoskeletal:        General: Normal range of motion.     Cervical back: Normal range of motion.   Skin:    General: Skin is warm.   Neurological:     Mental Status: She is alert. Mental status is at baseline.     IMPRESSION / MDM / ASSESSMENT AND PLAN / ED COURSE  I reviewed the triage vital signs and the nursing notes.  Differential diagnosis including acute cholecystitis, symptomatic cholelithiasis, acute pancreatitis, acute appendicitis or other acute intra-abdominal pathology, ACS, anemia, acid reflux/GERD  Given IV fluids, antiemetics and pain medication  EKG  I, Clotilda Punter, the attending physician, personally viewed and interpreted this ECG.  EKG showed normal sinus rhythm.  Low voltage.  T waves that is inverted to  the septal leads.  No significant ST elevation or depression.  No findings of acute ischemia or dysrhythmia.  No significant change compared to prior EKG.  No tachycardic or bradycardic dysrhythmias while on cardiac telemetry.  RADIOLOGY I independently reviewed imaging, my interpretation of imaging: Right upper quadrant ultrasound -   LABS (all labs ordered are listed, but only abnormal results are displayed) Labs interpreted as -    Labs Reviewed  COMPREHENSIVE METABOLIC PANEL WITH GFR - Abnormal; Notable for the following components:      Result Value   Glucose, Bld 225 (*)    All other components within normal limits  LIPASE, BLOOD  CBC  URINALYSIS, ROUTINE W REFLEX MICROSCOPIC  TROPONIN I (HIGH SENSITIVITY)     MDM  Lab work with no leukocytosis.  Creatinine at baseline with no significant electrolyte abnormality.  Normal LFTs.  Troponin negative have a low suspicion  for ACS.  Clinical Course as of 06/25/24 1337  Fri Jun 25, 2024  1236 Patient is seen she is feeling much better continues to have some pressure to her upper abdomen.  Have a low suspicion for acute cholecystitis.  Does have gallstones with some sludge but no obvious findings of an infectious process.  No leukocytosis and T. bili with normal LFTs.  Will attempt p.o. challenge.  Will reevaluate. [SM]  1337 On reevaluation patient is feeling much better and able to tolerate p.o. without significant pain.  Discussed the patient's case with general surgery Dr. Cesar, will have the patient follow-up as an outpatient.  Given pain medication.  Discussed return precautions to the emergency department if she had return of her symptoms.  Discussed a gallbladder eating plan.  Given information to call general surgery for close follow-up.  Discussed close follow-up with her primary care provider. [SM]    Clinical Course User Index [SM] Suzanne Kirsch, MD     PROCEDURES:  Critical Care performed: No  Procedures  Patient's presentation is most consistent with acute presentation with potential threat to life or bodily function.   MEDICATIONS ORDERED IN ED: Medications  ondansetron  (ZOFRAN ) injection 4 mg (4 mg Intravenous Given 06/25/24 0850)  morphine  (PF) 4 MG/ML injection 4 mg (4 mg Intravenous Given 06/25/24 0853)  sodium chloride  0.9 % bolus 500 mL (0 mLs Intravenous Stopped 06/25/24 1213)    FINAL CLINICAL IMPRESSION(S) / ED DIAGNOSES   Final diagnoses:  Calculus of gallbladder without cholecystitis without obstruction     Rx / DC Orders   ED Discharge Orders          Ordered    oxyCODONE -acetaminophen  (PERCOCET) 5-325 MG tablet  Every 4 hours PRN        06/25/24 1334    ondansetron  (ZOFRAN -ODT) 4 MG disintegrating tablet  Every 8 hours PRN        06/25/24 1334             Note:  This document was prepared using Dragon voice recognition software and may include unintentional  dictation errors.   Suzanne Kirsch, MD 06/25/24 [phone]

## 2024-06-25 NOTE — ED Triage Notes (Signed)
 Pt to ED via POV from home. Pt reports RUQ pain that started at 0430 and has been constant. Pt also endorses nausea. Pt reports hx of appendectomy. No CP, SOB, V/D or urinary symptoms.

## 2024-06-29 ENCOUNTER — Ambulatory Visit: Payer: Self-pay | Admitting: General Surgery

## 2024-06-29 DIAGNOSIS — K8020 Calculus of gallbladder without cholecystitis without obstruction: Secondary | ICD-10-CM | POA: Diagnosis not present

## 2024-06-29 NOTE — H&P (View-Only) (Signed)
 History of Present Illness Vanessa Higgins is a 73 year old female who presents with abdominal pain.  She experienced severe right upper quadrant pain on June 25, 2024, which woke her up at 4:30 AM. The pain was described as feeling like a 'balloon' and was severe enough to prevent her from bending over. She sought care in the emergency room where an abdominal ultrasound revealed gallstones and sludge in the gallbladder, with thickening of the gallbladder wall. The common bile duct diameter was noted to be 5 mm, and her total bilirubin was 0.9 mg/dL. Her white blood cell count was 9.8 x 10^9/L.  The pain was initially continuous and did not subside until she received pain medication in the emergency room. Since then, she has experienced only occasional, less severe pain. No previous similar episodes and no association of the pain with eating. No history of gallbladder issues prior to this event.  Her past surgical history includes a hernia repair and a tubal ligation. Her daughter had a cholecystectomy in her thirties due to severe inflammation.      PAST MEDICAL HISTORY:  Past Medical History:  Diagnosis Date   Asthma (HHS-HCC) 07/09/2012   Benign neoplasm of colon    Borderline systolic HTN 06/24/2014   Chronic obstructive asthma, unspecified (CMS/HHS-HCC)    Colitis, unspecified    Diabetes (CMS/HHS-HCC) 06/24/2014   Essential hypertension 07/09/2012   Fatty infiltration of liver 09/22/2014   Gastroesophageal reflux disease without esophagitis 02/01/2015   GERD (gastroesophageal reflux disease)    H/O adenomatous polyp of colon    History of migraine headaches    HTN (hypertension)    IDA (iron deficiency anemia) 10/20/2014   Iron deficiency anemia, unspecified    Obesity 06/24/2014   OSA (obstructive sleep apnea) 04/21/2017   Last Assessment & Plan:  compliant.   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 Mardee   OPEN REDUCTION ANKLE FRACTURE Left 04/07/2014   COLONOSCOPY  11/17/2014   04/26/2011; PH Adenomatous Polyp: CBF 10/2019 Recall ltr mailed    EGD  11/17/2014   No repeat per RTE   Right total knee arthroplasty using computer-assisted navigation  03/03/2019   Dr Mardee   INGUINAL HERNIA REPAIR Right 11/02/2020   Medium Ultra Pro mesh.   APPENDECTOMY     FLEXIBLE SIGMOIDOSCOPY  1980s   Colitis   LAPAROSCOPIC TUBAL LIGATION     PARTIAL THYROIDECTOMY Left    POLYPECTOMY           MEDICATIONS:  Outpatient Encounter Medications as of 06/29/2024  Medication Sig Dispense Refill   albuterol  sulfate 90 mcg/actuation AePB Inhale 2 puffs using inhaler as needed     aspirin  81 MG EC tablet Take by mouth     atorvastatin  (LIPITOR) 20 MG tablet Take 20 mg by mouth nightly     b complex vitamins tablet Take 1 tablet by mouth once daily       budesonide-glycopyrrolate -formoterol  (BREZTRI  AEROSPHERE) 160-9-4.8 mcg/actuation inhaler Inhale 2 inhalations into the lungs  cholecalciferol  (VITAMIN D3) 1,000 unit tablet Take by mouth     cyanocobalamin  (VITAMIN B12) 1000 MCG tablet Take 1,000 mcg by mouth nightly     dapagliflozin  propanediol (FARXIGA ) 10 mg tablet Take 10 mg by mouth every morning before breakfast     dilTIAZem  (CARDIZEM  CD) 240 MG CD capsule Take 240 mg by mouth once daily     diphenoxylate -atropine  (LOMOTIL ) 2.5-0.025 mg tablet Take by mouth     LANTUS  SOLOSTAR U-100 INSULIN  pen injector (concentration 100 units/mL) INJECT 20 UNITS INTO THE SKIN EVERY MORNING.     loratadine  (CLARITIN ) 10 mg tablet Take 10 mg by mouth once  daily.     metFORMIN  (GLUCOPHAGE ) 1000 MG tablet Take 1,000 mg by mouth 2 (two) times daily with meals    3   mometasone  (NASONEX ) 50 mcg/actuation nasal spray Place 2 sprays into both nostrils as needed.     montelukast  (SINGULAIR ) 10 mg tablet Take 10 mg by mouth once daily.      ondansetron  (ZOFRAN -ODT) 4 MG disintegrating tablet Take 4 mg by mouth every 8 (eight) hours as needed     pantoprazole  (PROTONIX ) 20 MG DR tablet Take 20 mg by mouth once daily     telmisartan -hydrochlorothiazide (MICARDIS  HCT) 80-25 mg tablet Take 1 tablet by mouth once daily     venlafaxine  (EFFEXOR -XR) 150 MG XR capsule Take 150 mg by mouth every morning        verapamil  (VERELAN  PM) 300 mg ER capsule Take 300 mg by mouth nightly.     estradioL  (ESTRACE ) 0.01 % (0.1 mg/gram) vaginal cream PLACE 0.5G NIGHTLY FOR TWO WEEKS THEN TWICE A WEEK AFTER (Patient not taking: Reported on 06/29/2024)     omeprazole  (PRILOSEC) 20 MG DR capsule Take 20 mg by mouth once daily (Patient not taking: Reported on 06/29/2024)     semaglutide  (OZEMPIC  SUBQ) Inject subcutaneously Unsure of dose (Patient not taking: Reported on 06/29/2024)     No facility-administered encounter medications on file as of 06/29/2024.     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   Social Drivers of Health   Financial Resource Strain: Low Risk  (06/29/2024)   Overall Financial Resource Strain (CARDIA)     Difficulty of Paying Living Expenses: Not hard at all  Food Insecurity: No Food Insecurity (06/29/2024)   Hunger Vital Sign    Worried About Running Out of Food in the Last Year: Never true    Ran Out of Food in the Last Year: Never true  Transportation Needs: No Transportation Needs (06/29/2024)   PRAPARE - Administrator, Civil Service (Medical): No    Lack of Transportation (Non-Medical): No    FAMILY HISTORY:  Family History  Problem Relation Name Age of Onset   Pancreatic cancer Mother     Myocardial Infarction (Heart attack) Father     Brain cancer Sister  Colon cancer Brother     No Known Problems Son     Inflammatory bowel disease Daughter       GENERAL REVIEW OF SYSTEMS:   General ROS: negative for - chills, fatigue, fever, weight gain or weight loss Allergy and Immunology ROS: negative for - hives  Hematological and Lymphatic ROS: negative for - bleeding problems or bruising, negative for palpable nodes Endocrine ROS: negative for - heat or cold intolerance, hair changes Respiratory ROS: negative for - cough, shortness of breath or wheezing Cardiovascular ROS: no chest pain or palpitations GI ROS: negative for nausea, vomiting, positive for abdominal pain, diarrhea Musculoskeletal ROS: negative for - joint swelling or muscle pain Neurological ROS: negative for - confusion, syncope Dermatological ROS: negative for pruritus and rash  PHYSICAL EXAM:  Vitals:   06/29/24 1055  BP: 119/77  Pulse: 82  .  Ht:160 cm (5' 2.99) Wt:87.1 kg (192 lb 0.3 oz) ADJ:Anib surface area is 1.97 meters squared. Body mass index is 34.02 kg/m.SABRA   GENERAL: Alert, active, oriented x3  HEENT: Pupils equal reactive to light. Extraocular movements are intact. Sclera clear. Palpebral conjunctiva normal red color.Pharynx clear.  NECK: Supple with no palpable mass and no adenopathy.  LUNGS: Sound clear with no rales rhonchi or wheezes.  HEART: Regular rhythm S1 and S2  without murmur.  ABDOMEN: Soft and depressible, nontender with no palpable mass, no hepatomegaly.   EXTREMITIES: Well-developed well-nourished symmetrical with no dependent edema.  NEUROLOGICAL: Awake alert oriented, facial expression symmetrical, moving all extremities.   Results LABS Total bilirubin: 0.9 mg/dL (93/72/7974) WBC: 9.8 k89^6/O (06/25/2024)  RADIOLOGY Abdominal ultrasound: Cholelithiasis with sludge, gallbladder wall thickening, common bile duct diameter 5 mm (06/25/2024)    Assessment & Plan Cholelithiasis with cholecystitis   She presents with acute cholelithiasis and cholecystitis, experiencing classic right upper quadrant pain that disrupted her sleep. An ultrasound confirmed gallstones, sludge, and gallbladder wall thickening, indicating inflammation. The pain required an emergency room visit and was alleviated with medication. No previous episodes or significant dietary triggers were identified. Due to the risk of recurrent episodes and potential complications from gallstones obstructing the bile duct, surgical intervention is recommended. Laparoscopic cholecystectomy, a minimally invasive procedure with same-day discharge, was explained. Potential post-operative changes in bowel habits, such as transient diarrhea, and dietary recommendations during recovery were discussed. Informed consent was obtained, covering risks like bile leak, injury to common bile duct, injury to adjacent organs, transient diarrhea and benefits of preventing future episodes. Stop aspirin  5 days before surgery. Advise on post-operative dietary modifications, avoiding fried and greasy foods during recovery. Educate on post-operative care, including incision care and activity restrictions. Plan a follow-up appointment two weeks post-surgery.   Cholelithiasis without cholecystitis [K80.20]          Patient verbalized understanding, all questions were answered, and were agreeable with the plan outlined  above.   Lucas Sjogren, MD  Electronically signed by Lucas Sjogren, MD

## 2024-06-29 NOTE — H&P (Signed)
 History of Present Illness Vanessa Higgins is a 73 year old female who presents with abdominal pain.  She experienced severe right upper quadrant pain on June 25, 2024, which woke her up at 4:30 AM. The pain was described as feeling like a 'balloon' and was severe enough to prevent her from bending over. She sought care in the emergency room where an abdominal ultrasound revealed gallstones and sludge in the gallbladder, with thickening of the gallbladder wall. The common bile duct diameter was noted to be 5 mm, and her total bilirubin was 0.9 mg/dL. Her white blood cell count was 9.8 x 10^9/L.  The pain was initially continuous and did not subside until she received pain medication in the emergency room. Since then, she has experienced only occasional, less severe pain. No previous similar episodes and no association of the pain with eating. No history of gallbladder issues prior to this event.  Her past surgical history includes a hernia repair and a tubal ligation. Her daughter had a cholecystectomy in her thirties due to severe inflammation.      PAST MEDICAL HISTORY:  Past Medical History:  Diagnosis Date   Asthma (HHS-HCC) 07/09/2012   Benign neoplasm of colon    Borderline systolic HTN 06/24/2014   Chronic obstructive asthma, unspecified (CMS/HHS-HCC)    Colitis, unspecified    Diabetes (CMS/HHS-HCC) 06/24/2014   Essential hypertension 07/09/2012   Fatty infiltration of liver 09/22/2014   Gastroesophageal reflux disease without esophagitis 02/01/2015   GERD (gastroesophageal reflux disease)    H/O adenomatous polyp of colon    History of migraine headaches    HTN (hypertension)    IDA (iron deficiency anemia) 10/20/2014   Iron deficiency anemia, unspecified    Obesity 06/24/2014   OSA (obstructive sleep apnea) 04/21/2017   Last Assessment & Plan:  compliant.   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 Mardee   OPEN REDUCTION ANKLE FRACTURE Left 04/07/2014   COLONOSCOPY  11/17/2014   04/26/2011; PH Adenomatous Polyp: CBF 10/2019 Recall ltr mailed    EGD  11/17/2014   No repeat per RTE   Right total knee arthroplasty using computer-assisted navigation  03/03/2019   Dr Mardee   INGUINAL HERNIA REPAIR Right 11/02/2020   Medium Ultra Pro mesh.   APPENDECTOMY     FLEXIBLE SIGMOIDOSCOPY  1980s   Colitis   LAPAROSCOPIC TUBAL LIGATION     PARTIAL THYROIDECTOMY Left    POLYPECTOMY           MEDICATIONS:  Outpatient Encounter Medications as of 06/29/2024  Medication Sig Dispense Refill   albuterol  sulfate 90 mcg/actuation AePB Inhale 2 puffs using inhaler as needed     aspirin  81 MG EC tablet Take by mouth     atorvastatin  (LIPITOR) 20 MG tablet Take 20 mg by mouth nightly     b complex vitamins tablet Take 1 tablet by mouth once daily       budesonide-glycopyrrolate -formoterol  (BREZTRI  AEROSPHERE) 160-9-4.8 mcg/actuation inhaler Inhale 2 inhalations into the lungs  cholecalciferol  (VITAMIN D3) 1,000 unit tablet Take by mouth     cyanocobalamin  (VITAMIN B12) 1000 MCG tablet Take 1,000 mcg by mouth nightly     dapagliflozin  propanediol (FARXIGA ) 10 mg tablet Take 10 mg by mouth every morning before breakfast     dilTIAZem  (CARDIZEM  CD) 240 MG CD capsule Take 240 mg by mouth once daily     diphenoxylate -atropine  (LOMOTIL ) 2.5-0.025 mg tablet Take by mouth     LANTUS  SOLOSTAR U-100 INSULIN  pen injector (concentration 100 units/mL) INJECT 20 UNITS INTO THE SKIN EVERY MORNING.     loratadine  (CLARITIN ) 10 mg tablet Take 10 mg by mouth once  daily.     metFORMIN  (GLUCOPHAGE ) 1000 MG tablet Take 1,000 mg by mouth 2 (two) times daily with meals    3   mometasone  (NASONEX ) 50 mcg/actuation nasal spray Place 2 sprays into both nostrils as needed.     montelukast  (SINGULAIR ) 10 mg tablet Take 10 mg by mouth once daily.      ondansetron  (ZOFRAN -ODT) 4 MG disintegrating tablet Take 4 mg by mouth every 8 (eight) hours as needed     pantoprazole  (PROTONIX ) 20 MG DR tablet Take 20 mg by mouth once daily     telmisartan -hydrochlorothiazide (MICARDIS  HCT) 80-25 mg tablet Take 1 tablet by mouth once daily     venlafaxine  (EFFEXOR -XR) 150 MG XR capsule Take 150 mg by mouth every morning        verapamil  (VERELAN  PM) 300 mg ER capsule Take 300 mg by mouth nightly.     estradioL  (ESTRACE ) 0.01 % (0.1 mg/gram) vaginal cream PLACE 0.5G NIGHTLY FOR TWO WEEKS THEN TWICE A WEEK AFTER (Patient not taking: Reported on 06/29/2024)     omeprazole  (PRILOSEC) 20 MG DR capsule Take 20 mg by mouth once daily (Patient not taking: Reported on 06/29/2024)     semaglutide  (OZEMPIC  SUBQ) Inject subcutaneously Unsure of dose (Patient not taking: Reported on 06/29/2024)     No facility-administered encounter medications on file as of 06/29/2024.     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   Social Drivers of Health   Financial Resource Strain: Low Risk  (06/29/2024)   Overall Financial Resource Strain (CARDIA)     Difficulty of Paying Living Expenses: Not hard at all  Food Insecurity: No Food Insecurity (06/29/2024)   Hunger Vital Sign    Worried About Running Out of Food in the Last Year: Never true    Ran Out of Food in the Last Year: Never true  Transportation Needs: No Transportation Needs (06/29/2024)   PRAPARE - Administrator, Civil Service (Medical): No    Lack of Transportation (Non-Medical): No    FAMILY HISTORY:  Family History  Problem Relation Name Age of Onset   Pancreatic cancer Mother     Myocardial Infarction (Heart attack) Father     Brain cancer Sister  Colon cancer Brother     No Known Problems Son     Inflammatory bowel disease Daughter       GENERAL REVIEW OF SYSTEMS:   General ROS: negative for - chills, fatigue, fever, weight gain or weight loss Allergy and Immunology ROS: negative for - hives  Hematological and Lymphatic ROS: negative for - bleeding problems or bruising, negative for palpable nodes Endocrine ROS: negative for - heat or cold intolerance, hair changes Respiratory ROS: negative for - cough, shortness of breath or wheezing Cardiovascular ROS: no chest pain or palpitations GI ROS: negative for nausea, vomiting, positive for abdominal pain, diarrhea Musculoskeletal ROS: negative for - joint swelling or muscle pain Neurological ROS: negative for - confusion, syncope Dermatological ROS: negative for pruritus and rash  PHYSICAL EXAM:  Vitals:   06/29/24 1055  BP: 119/77  Pulse: 82  .  Ht:160 cm (5' 2.99) Wt:87.1 kg (192 lb 0.3 oz) ADJ:Anib surface area is 1.97 meters squared. Body mass index is 34.02 kg/m.SABRA   GENERAL: Alert, active, oriented x3  HEENT: Pupils equal reactive to light. Extraocular movements are intact. Sclera clear. Palpebral conjunctiva normal red color.Pharynx clear.  NECK: Supple with no palpable mass and no adenopathy.  LUNGS: Sound clear with no rales rhonchi or wheezes.  HEART: Regular rhythm S1 and S2  without murmur.  ABDOMEN: Soft and depressible, nontender with no palpable mass, no hepatomegaly.   EXTREMITIES: Well-developed well-nourished symmetrical with no dependent edema.  NEUROLOGICAL: Awake alert oriented, facial expression symmetrical, moving all extremities.   Results LABS Total bilirubin: 0.9 mg/dL (93/72/7974) WBC: 9.8 k89^6/O (06/25/2024)  RADIOLOGY Abdominal ultrasound: Cholelithiasis with sludge, gallbladder wall thickening, common bile duct diameter 5 mm (06/25/2024)    Assessment & Plan Cholelithiasis with cholecystitis   She presents with acute cholelithiasis and cholecystitis, experiencing classic right upper quadrant pain that disrupted her sleep. An ultrasound confirmed gallstones, sludge, and gallbladder wall thickening, indicating inflammation. The pain required an emergency room visit and was alleviated with medication. No previous episodes or significant dietary triggers were identified. Due to the risk of recurrent episodes and potential complications from gallstones obstructing the bile duct, surgical intervention is recommended. Laparoscopic cholecystectomy, a minimally invasive procedure with same-day discharge, was explained. Potential post-operative changes in bowel habits, such as transient diarrhea, and dietary recommendations during recovery were discussed. Informed consent was obtained, covering risks like bile leak, injury to common bile duct, injury to adjacent organs, transient diarrhea and benefits of preventing future episodes. Stop aspirin  5 days before surgery. Advise on post-operative dietary modifications, avoiding fried and greasy foods during recovery. Educate on post-operative care, including incision care and activity restrictions. Plan a follow-up appointment two weeks post-surgery.   Cholelithiasis without cholecystitis [K80.20]          Patient verbalized understanding, all questions were answered, and were agreeable with the plan outlined  above.   Lucas Sjogren, MD  Electronically signed by Lucas Sjogren, MD

## 2024-07-01 ENCOUNTER — Encounter
Admission: RE | Admit: 2024-07-01 | Discharge: 2024-07-01 | Disposition: A | Discharge status: Home / Self Care | Source: Ambulatory Visit | Attending: General Surgery | Admitting: General Surgery

## 2024-07-01 ENCOUNTER — Other Ambulatory Visit: Payer: Self-pay

## 2024-07-01 DIAGNOSIS — Z01812 Encounter for preprocedural laboratory examination: Secondary | ICD-10-CM

## 2024-07-01 DIAGNOSIS — K8020 Calculus of gallbladder without cholecystitis without obstruction: Secondary | ICD-10-CM

## 2024-07-01 DIAGNOSIS — Z794 Long term (current) use of insulin: Secondary | ICD-10-CM

## 2024-07-01 HISTORY — DX: Calculus of gallbladder without cholecystitis without obstruction: K80.20

## 2024-07-01 NOTE — Patient Instructions (Addendum)
 Your procedure is scheduled on: 07/05/2024  Report to the Registration Desk on the 1st floor of the [facility]. To find out your arrival time, please call [phone] between 1PM - 3PM on: 07/01/2024  If your arrival time is 6:00 am, do not arrive before that time as the [facility] entrance doors do not open until 6:00 am.  REMEMBER: Instructions that are not followed completely may result in serious medical risk, up to and including death; or upon the discretion of your surgeon and anesthesiologist your surgery may need to be rescheduled.  Do not eat food or drink anything after midnight the night before surgery.  No gum chewing or hard candies.   One week prior to surgery: Stop Anti-inflammatories (NSAIDS) such as Advil, Aleve, Ibuprofen, Motrin, Naproxen, Naprosyn and Aspirin  based products such as Excedrin, Goody's Powder, BC Powder. Stop ANY OVER THE COUNTER supplements until after surgery.  You may however, continue to take Tylenol  if needed for pain up until the day of surgery.    Stop Farxiga  three days prior to surgery. Last dose is Thursday, July 02/2024  Take Half dose only of Lantus  the night before surgery. Take 10 units only.  Stop metformin  two days prior to surgery. Last dose is July 02, 2024   Please call your PCP or cardiologist regarding Aspirin .  DO not administer Semaglutide ,0.25 or 0.5MG  7 days prior to surgery. Last dose should be June 30, Monday  Continue taking all of your other prescription medications up until the day of surgery.  ON THE DAY OF SURGERY ONLY TAKE THESE MEDICATIONS WITH SIPS OF WATER:  diltiazem  (CARDIZEM  CD) pantoprazole  (PROTONIX ) venlafaxine  XR (EFFEXOR -XR)   Use inhalers on the day of surgery and bring to the hospital.  No Alcohol for 24 hours before or after surgery.  No Smoking including e-cigarettes for 24 hours before surgery.  No chewable tobacco products for at least 6 hours before surgery.  No nicotine patches on the  day of surgery.  Do not use any recreational drugs for at least a week (preferably 2 weeks) before your surgery.  Please be advised that the combination of cocaine and anesthesia may have negative outcomes, up to and including death. If you test positive for cocaine, your surgery will be cancelled.  On the morning of surgery brush your teeth with toothpaste and water, you may rinse your mouth with mouthwash if you wish. Do not swallow any toothpaste or mouthwash.  Use CHG Soap or wipes as directed on instruction sheet.   Do not wear jewelry, make-up, hairpins, clips or nail polish.  Do not wear lotions, powders, or perfumes.   Do not shave body hair from the neck down 48 hours before surgery.  Contact lenses, hearing aids and dentures may not be worn into surgery.  Do not bring valuables to the hospital. [facility] is not responsible for any missing/lost belongings or valuables.    Notify your doctor if there is any change in your medical condition (cold, fever, infection).  Wear comfortable clothing (specific to your surgery type) to the hospital.  After surgery, you can help prevent lung complications by doing breathing exercises.  Take deep breaths and cough every 1-2 hours. Your doctor may order a device called an Incentive Spirometer to help you take deep breaths. When coughing or sneezing, hold a pillow firmly against your incision with both hands. This is called "splinting." Doing this helps protect your incision. It also decreases belly discomfort.  If you are  being admitted to the hospital overnight, leave your suitcase in the car. After surgery it may be brought to your room.   If you are being discharged the day of surgery, you will not be allowed to drive home. You will need a responsible individual to drive you home and stay with you for 24 hours after surgery.     Please call the Pre-admissions Testing Dept. at [phone] if you have any questions about  these instructions.  Surgery Visitation Policy:  Patients having surgery or a procedure may have two visitors.  Children under the age of 52 must have an adult with them who is not the patient.  Inpatient Visitation:    Visiting hours are 7 a.m. to 8 p.m. Up to four visitors are allowed at one time in a patient room. The visitors may rotate out with other people during the day.  One visitor age 21 or older may stay with the patient overnight and must be in the room by 8 p.m.   Merchandiser, retail to address health-related social needs:  [email or web address]      Preparing for Surgery with CHLORHEXIDINE  GLUCONATE (CHG) Soap  Chlorhexidine  Gluconate (CHG) Soap  o An antiseptic cleaner that kills germs and bonds with the skin to continue killing germs even after washing  o Used for showering the night before surgery and morning of surgery  Before surgery, you can play an important role by reducing the number of germs on your skin.  CHG (Chlorhexidine  gluconate) soap is an antiseptic cleanser which kills germs and bonds with the skin to continue killing germs even after washing.  Please do not use if you have an allergy to CHG or antibacterial soaps. If your skin becomes reddened/irritated stop using the CHG.  1. Shower the NIGHT BEFORE SURGERY and the MORNING OF SURGERY with CHG soap.  2. If you choose to wash your hair, wash your hair first as usual with your normal shampoo.  3. After shampooing, rinse your hair and body thoroughly to remove the shampoo.  4. Use CHG as you would any other liquid soap. You can apply CHG directly to the skin and wash gently with a scrungie or a clean washcloth.  5. Apply the CHG soap to your body only from the neck down. Do not use on open wounds or open sores. Avoid contact with your eyes, ears, mouth, and genitals (private parts). Wash face and genitals (private parts) with your normal soap.  6. Wash thoroughly, paying  special attention to the area where your surgery will be performed.  7. Thoroughly rinse your body with warm water.  8. Do not shower/wash with your normal soap after using and rinsing off the CHG soap.  9. Pat yourself dry with a clean towel.  10. Wear clean pajamas to bed the night before surgery.  12. Place clean sheets on your bed the night of your first shower and do not sleep with pets.  13. Shower again with the CHG soap on the day of surgery prior to arriving at the hospital.  14. Do not apply any deodorants/lotions/powders.  15. Please wear clean clothes to the hospital.

## 2024-07-05 ENCOUNTER — Other Ambulatory Visit

## 2024-07-05 ENCOUNTER — Other Ambulatory Visit: Payer: Self-pay

## 2024-07-05 ENCOUNTER — Ambulatory Visit: Payer: Self-pay | Admitting: Urgent Care

## 2024-07-05 ENCOUNTER — Encounter
Admission: RE | Disposition: A | Discharge status: Home / Self Care | Payer: Self-pay | Source: Home / Self Care | Attending: General Surgery

## 2024-07-05 ENCOUNTER — Ambulatory Visit
Admission: RE | Admit: 2024-07-05 | Discharge: 2024-07-05 | Disposition: A | Discharge status: Home / Self Care | Attending: General Surgery | Admitting: General Surgery

## 2024-07-05 ENCOUNTER — Encounter: Payer: Self-pay | Admitting: General Surgery

## 2024-07-05 DIAGNOSIS — J4489 Other specified chronic obstructive pulmonary disease: Secondary | ICD-10-CM | POA: Diagnosis not present

## 2024-07-05 DIAGNOSIS — K219 Gastro-esophageal reflux disease without esophagitis: Secondary | ICD-10-CM | POA: Insufficient documentation

## 2024-07-05 DIAGNOSIS — I2510 Atherosclerotic heart disease of native coronary artery without angina pectoris: Secondary | ICD-10-CM | POA: Diagnosis not present

## 2024-07-05 DIAGNOSIS — E119 Type 2 diabetes mellitus without complications: Secondary | ICD-10-CM | POA: Insufficient documentation

## 2024-07-05 DIAGNOSIS — K760 Fatty (change of) liver, not elsewhere classified: Secondary | ICD-10-CM | POA: Insufficient documentation

## 2024-07-05 DIAGNOSIS — K8010 Calculus of gallbladder with chronic cholecystitis without obstruction: Secondary | ICD-10-CM | POA: Diagnosis not present

## 2024-07-05 DIAGNOSIS — K8020 Calculus of gallbladder without cholecystitis without obstruction: Secondary | ICD-10-CM

## 2024-07-05 DIAGNOSIS — E669 Obesity, unspecified: Secondary | ICD-10-CM | POA: Diagnosis not present

## 2024-07-05 DIAGNOSIS — I10 Essential (primary) hypertension: Secondary | ICD-10-CM | POA: Insufficient documentation

## 2024-07-05 DIAGNOSIS — G4733 Obstructive sleep apnea (adult) (pediatric): Secondary | ICD-10-CM | POA: Insufficient documentation

## 2024-07-05 DIAGNOSIS — Z01812 Encounter for preprocedural laboratory examination: Secondary | ICD-10-CM

## 2024-07-05 DIAGNOSIS — E1165 Type 2 diabetes mellitus with hyperglycemia: Secondary | ICD-10-CM

## 2024-07-05 DIAGNOSIS — Z6834 Body mass index (BMI) 34.0-34.9, adult: Secondary | ICD-10-CM | POA: Diagnosis not present

## 2024-07-05 HISTORY — PX: INDOCYANINE GREEN FLUORESCENCE IMAGING (ICG): SHX7595

## 2024-07-05 LAB — GLUCOSE, CAPILLARY
Glucose-Capillary: 221 mg/dL — ABNORMAL HIGH (ref 70–99)
Glucose-Capillary: 265 mg/dL — ABNORMAL HIGH (ref 70–99)

## 2024-07-05 SURGERY — CHOLECYSTECTOMY, ROBOT-ASSISTED, LAPAROSCOPIC
Anesthesia: General | Site: Abdomen

## 2024-07-05 MED ORDER — FENTANYL CITRATE (PF) 100 MCG/2ML IJ SOLN
INTRAMUSCULAR | Status: DC | PRN
  Administered 2024-07-05: 50 ug via INTRAVENOUS

## 2024-07-05 MED ORDER — ACETAMINOPHEN 10 MG/ML IV SOLN
INTRAVENOUS | Status: DC | PRN
Start: 2024-07-05 — End: 2024-07-05
  Administered 2024-07-05: 1000 mg via INTRAVENOUS

## 2024-07-05 MED ORDER — DEXAMETHASONE SODIUM PHOSPHATE 10 MG/ML IJ SOLN
INTRAMUSCULAR | Status: AC
  Filled 2024-07-05: qty 1

## 2024-07-05 MED ORDER — OXYCODONE HCL 5 MG PO TABS
ORAL_TABLET | ORAL | Status: AC
  Filled 2024-07-05: qty 1

## 2024-07-05 MED ORDER — CEFAZOLIN SODIUM-DEXTROSE 2-4 GM/100ML-% IV SOLN
2.0000 g | INTRAVENOUS | Status: AC
  Administered 2024-07-05: 2 g via INTRAVENOUS

## 2024-07-05 MED ORDER — OXYCODONE HCL 5 MG PO TABS
5.0000 mg | ORAL_TABLET | Freq: Once | ORAL | Status: AC | PRN
  Administered 2024-07-05: 5 mg via ORAL

## 2024-07-05 MED ORDER — SUGAMMADEX SODIUM 200 MG/2ML IV SOLN
INTRAVENOUS | Status: DC | PRN
  Administered 2024-07-05: 200 mg via INTRAVENOUS

## 2024-07-05 MED ORDER — INSULIN ASPART 100 UNIT/ML IJ SOLN
INTRAMUSCULAR | Status: AC
Start: 2024-07-05 — End: 2024-07-05
  Filled 2024-07-05: qty 1

## 2024-07-05 MED ORDER — CHLORHEXIDINE GLUCONATE 0.12 % MT SOLN
OROMUCOSAL | Status: AC
Start: 2024-07-05 — End: 2024-07-05
  Filled 2024-07-05: qty 15

## 2024-07-05 MED ORDER — PROPOFOL 1000 MG/100ML IV EMUL
INTRAVENOUS | Status: AC
Start: 2024-07-05 — End: 2024-07-05
  Filled 2024-07-05: qty 100

## 2024-07-05 MED ORDER — ORAL CARE MOUTH RINSE: 15.0000 mL | Freq: Once | OROMUCOSAL | Status: AC

## 2024-07-05 MED ORDER — SODIUM CHLORIDE 0.9 % IV SOLN: INTRAVENOUS | Status: DC

## 2024-07-05 MED ORDER — LACTATED RINGERS IV SOLN: INTRAVENOUS | Status: DC | PRN

## 2024-07-05 MED ORDER — OXYCODONE HCL 5 MG/5ML PO SOLN: 5.0000 mg | Freq: Once | ORAL | Status: AC | PRN

## 2024-07-05 MED ORDER — LIDOCAINE HCL (CARDIAC) PF 100 MG/5ML IV SOSY
PREFILLED_SYRINGE | INTRAVENOUS | Status: DC | PRN
  Administered 2024-07-05: 100 mg via INTRAVENOUS

## 2024-07-05 MED ORDER — BUPIVACAINE-EPINEPHRINE 0.25% -1:200000 IJ SOLN
INTRAMUSCULAR | Status: DC | PRN
  Administered 2024-07-05: 30 mL

## 2024-07-05 MED ORDER — ONDANSETRON HCL 4 MG/2ML IJ SOLN
INTRAMUSCULAR | Status: DC | PRN
Start: 2024-07-05 — End: 2024-07-05
  Administered 2024-07-05: 4 mg via INTRAVENOUS

## 2024-07-05 MED ORDER — MIDAZOLAM HCL 2 MG/2ML IJ SOLN
INTRAMUSCULAR | Status: DC | PRN
  Administered 2024-07-05: 1 mg via INTRAVENOUS

## 2024-07-05 MED ORDER — INDOCYANINE GREEN 25 MG IV SOLR
1.2500 mg | Freq: Once | INTRAVENOUS | Status: AC
Start: 2024-07-05 — End: 2024-07-05
  Administered 2024-07-05: 1.25 mg via INTRAVENOUS

## 2024-07-05 MED ORDER — INSULIN ASPART 100 UNIT/ML IJ SOLN
5.0000 [IU] | Freq: Once | INTRAMUSCULAR | Status: AC
  Administered 2024-07-05: 5 [IU] via SUBCUTANEOUS

## 2024-07-05 MED ORDER — EPHEDRINE SULFATE-NACL 50-0.9 MG/10ML-% IV SOSY
PREFILLED_SYRINGE | INTRAVENOUS | Status: DC | PRN
  Administered 2024-07-05: 10 mg via INTRAVENOUS
  Administered 2024-07-05: 7.5 mg via INTRAVENOUS

## 2024-07-05 MED ORDER — MIDAZOLAM HCL 2 MG/2ML IJ SOLN
INTRAMUSCULAR | Status: AC
Start: 2024-07-05 — End: 2024-07-05
  Filled 2024-07-05: qty 2

## 2024-07-05 MED ORDER — CHLORHEXIDINE GLUCONATE 0.12 % MT SOLN
15.0000 mL | Freq: Once | OROMUCOSAL | Status: AC
  Administered 2024-07-05: 15 mL via OROMUCOSAL

## 2024-07-05 MED ORDER — PROPOFOL 10 MG/ML IV BOLUS
INTRAVENOUS | Status: DC | PRN
  Administered 2024-07-05: 150 ug/kg/min via INTRAVENOUS
  Administered 2024-07-05: 150 mg via INTRAVENOUS

## 2024-07-05 MED ORDER — FENTANYL CITRATE (PF) 100 MCG/2ML IJ SOLN
25.0000 ug | INTRAMUSCULAR | Status: DC | PRN
  Administered 2024-07-05 (×2): 50 ug via INTRAVENOUS

## 2024-07-05 MED ORDER — ROCURONIUM BROMIDE 100 MG/10ML IV SOLN
INTRAVENOUS | Status: DC | PRN
  Administered 2024-07-05: 50 mg via INTRAVENOUS

## 2024-07-05 MED ORDER — PROPOFOL 1000 MG/100ML IV EMUL
INTRAVENOUS | Status: AC
  Filled 2024-07-05: qty 100

## 2024-07-05 MED ORDER — ONDANSETRON HCL 4 MG/2ML IJ SOLN
INTRAMUSCULAR | Status: AC
  Filled 2024-07-05: qty 2

## 2024-07-05 MED ORDER — PHENYLEPHRINE 80 MCG/ML (10ML) SYRINGE FOR IV PUSH (FOR BLOOD PRESSURE SUPPORT)
PREFILLED_SYRINGE | INTRAVENOUS | Status: AC
  Filled 2024-07-05: qty 10

## 2024-07-05 MED ORDER — HYDROCODONE-ACETAMINOPHEN 5-325 MG PO TABS: 1.0000 | ORAL_TABLET | Freq: Four times a day (QID) | ORAL | 0 refills | Status: AC | PRN

## 2024-07-05 MED ORDER — ACETAMINOPHEN 10 MG/ML IV SOLN
INTRAVENOUS | Status: AC
  Filled 2024-07-05: qty 100

## 2024-07-05 MED ORDER — DEXAMETHASONE SODIUM PHOSPHATE 10 MG/ML IJ SOLN
INTRAMUSCULAR | Status: DC | PRN
  Administered 2024-07-05: 10 mg via INTRAVENOUS

## 2024-07-05 MED ORDER — FENTANYL CITRATE (PF) 100 MCG/2ML IJ SOLN
INTRAMUSCULAR | Status: AC
  Filled 2024-07-05: qty 2

## 2024-07-05 MED ORDER — LIDOCAINE HCL (PF) 2 % IJ SOLN
INTRAMUSCULAR | Status: AC
  Filled 2024-07-05: qty 5

## 2024-07-05 MED ORDER — BUPIVACAINE-EPINEPHRINE (PF) 0.25% -1:200000 IJ SOLN
INTRAMUSCULAR | Status: AC
  Filled 2024-07-05: qty 30

## 2024-07-05 MED ORDER — PHENYLEPHRINE 80 MCG/ML (10ML) SYRINGE FOR IV PUSH (FOR BLOOD PRESSURE SUPPORT)
PREFILLED_SYRINGE | INTRAVENOUS | Status: DC | PRN
  Administered 2024-07-05: 80 ug via INTRAVENOUS

## 2024-07-05 MED ORDER — CEFAZOLIN SODIUM-DEXTROSE 2-4 GM/100ML-% IV SOLN
INTRAVENOUS | Status: AC
Start: 2024-07-05 — End: 2024-07-05
  Filled 2024-07-05: qty 100

## 2024-07-05 SURGICAL SUPPLY — 43 items
BAG PRESSURE INF REUSE 1000 (BAG) IMPLANT
CANNULA REDUCER 12-8 DVNC XI (CANNULA) ×2 IMPLANT
CATH REDDICK CHOLANGI 4FR 50CM (CATHETERS) IMPLANT
CAUTERY HOOK MNPLR 1.6 DVNC XI (INSTRUMENTS) ×2 IMPLANT
CLIP LIGATING HEM O LOK PURPLE (MISCELLANEOUS) IMPLANT
CLIP LIGATING HEMO O LOK GREEN (MISCELLANEOUS) ×2 IMPLANT
DERMABOND ADVANCED .7 DNX12 (GAUZE/BANDAGES/DRESSINGS) ×2 IMPLANT
DRAPE ARM DVNC X/XI (DISPOSABLE) ×8 IMPLANT
DRAPE C-ARM XRAY 36X54 (DRAPES) IMPLANT
DRAPE COLUMN DVNC XI (DISPOSABLE) ×2 IMPLANT
ELECTRODE REM PT RTRN 9FT ADLT (ELECTROSURGICAL) ×2 IMPLANT
FORCEPS BPLR 8 MD DVNC XI (FORCEP) IMPLANT
FORCEPS BPLR FENES DVNC XI (FORCEP) ×2 IMPLANT
FORCEPS PROGRASP DVNC XI (FORCEP) ×2 IMPLANT
GLOVE BIO SURGEON STRL SZ 6.5 (GLOVE) ×4 IMPLANT
GLOVE BIOGEL PI IND STRL 6.5 (GLOVE) ×4 IMPLANT
GLOVE SURG SYN 6.5 ES PF (GLOVE) IMPLANT
GLOVE SURG SYN 6.5 PF PI (GLOVE) ×4 IMPLANT
GOWN STRL REUS W/ TWL LRG LVL3 (GOWN DISPOSABLE) ×8 IMPLANT
GRASPER SUT TROCAR 14GX15 (MISCELLANEOUS) ×2 IMPLANT
IRRIGATOR SUCT 8 DISP DVNC XI (IRRIGATION / IRRIGATOR) IMPLANT
IV CATH ANGIO 12GX3 LT BLUE (NEEDLE) IMPLANT
IV NS 1000ML BAXH (IV SOLUTION) IMPLANT
KIT PINK PAD W/HEAD ARM REST (MISCELLANEOUS) ×2 IMPLANT
LABEL OR SOLS (LABEL) ×2 IMPLANT
MANIFOLD NEPTUNE II (INSTRUMENTS) IMPLANT
NDL HYPO 22X1.5 SAFETY MO (MISCELLANEOUS) ×2 IMPLANT
NDL INSUFFLATION 14GA 120MM (NEEDLE) ×2 IMPLANT
NEEDLE HYPO 22X1.5 SAFETY MO (MISCELLANEOUS) ×2 IMPLANT
NEEDLE INSUFFLATION 14GA 120MM (NEEDLE) ×2 IMPLANT
NS IRRIG 500ML POUR BTL (IV SOLUTION) ×2 IMPLANT
OBTURATOR OPTICALSTD 8 DVNC (TROCAR) ×2 IMPLANT
PACK LAP CHOLECYSTECTOMY (MISCELLANEOUS) ×2 IMPLANT
SEAL UNIV 5-12 XI (MISCELLANEOUS) ×8 IMPLANT
SET TUBE SMOKE EVAC HIGH FLOW (TUBING) ×2 IMPLANT
SOLUTION ELECTROSURG ANTI STCK (MISCELLANEOUS) ×2 IMPLANT
SPIKE FLUID TRANSFER (MISCELLANEOUS) ×4 IMPLANT
SPONGE T-LAP 4X18 ~~LOC~~+RFID (SPONGE) IMPLANT
SUT VICRYL 0 UR6 27IN ABS (SUTURE) ×2 IMPLANT
SUTURE MNCRL 4-0 27XMF (SUTURE) ×2 IMPLANT
SYSTEM BAG RETRIEVAL 10MM (BASKET) ×2 IMPLANT
TRAP FLUID SMOKE EVACUATOR (MISCELLANEOUS) IMPLANT
WATER STERILE IRR 500ML POUR (IV SOLUTION) ×2 IMPLANT

## 2024-07-05 NOTE — Interval H&P Note (Signed)
 History and Physical Interval Note:  07/05/2024 11:26 AM  Vanessa Higgins  has presented today for surgery, with the diagnosis of K80.20 cholelithiasis w/o cholecystitis.  The various methods of treatment have been discussed with the patient and family. After consideration of risks, benefits and other options for treatment, the patient has consented to  Procedure(s): CHOLECYSTECTOMY, ROBOT-ASSISTED, LAPAROSCOPIC (N/A) as a surgical intervention.  The patient's history has been reviewed, patient examined, no change in status, stable for surgery.  I have reviewed the patient's chart and labs.  Questions were answered to the patient's satisfaction.     Lucas Sjogren

## 2024-07-05 NOTE — Transfer of Care (Signed)
 Immediate Anesthesia Transfer of Care Note  Patient: Vanessa Higgins  Procedure(s) Performed: CHOLECYSTECTOMY, ROBOT-ASSISTED, LAPAROSCOPIC (Abdomen) INDOCYANINE GREEN  FLUORESCENCE IMAGING (ICG)  Patient Location: PACU  Anesthesia Type:General  Level of Consciousness: drowsy and patient cooperative  Airway & Oxygen Therapy: Patient Spontanous Breathing  Post-op Assessment: Report given to RN and Post -op Vital signs reviewed and stable  Post vital signs: Reviewed and stable  Last Vitals:  Vitals Value Taken Time  BP    Temp    Pulse 56 07/05/24 12:44  Resp    SpO2 90 % 07/05/24 12:44  Vitals shown include unfiled device data.  Last Pain:  Vitals:   07/05/24 0953  TempSrc: Temporal  PainSc: 0-No pain         Complications: There were no known notable events for this encounter.

## 2024-07-05 NOTE — Anesthesia Procedure Notes (Signed)
 Procedure Name: Intubation Date/Time: 07/05/2024 11:37 AM  Performed by: Kearney Rosina SAILOR, RNPre-anesthesia Checklist: Patient identified, Emergency Drugs available, Suction available and Patient being monitored Patient Re-evaluated:Patient Re-evaluated prior to induction Oxygen Delivery Method: Circle system utilized Preoxygenation: Pre-oxygenation with 100% oxygen Induction Type: IV induction Ventilation: Mask ventilation without difficulty Laryngoscope Size: McGrath and 3 Grade View: Grade I Tube type: Oral Number of attempts: 1 Airway Equipment and Method: Stylet Placement Confirmation: ETT inserted through vocal cords under direct vision, positive ETCO2 and breath sounds checked- equal and bilateral Secured at: 21 cm Tube secured with: Tape Dental Injury: Teeth and Oropharynx as per pre-operative assessment  Comments: Atraumatic intubation

## 2024-07-05 NOTE — Discharge Instructions (Signed)

## 2024-07-05 NOTE — Op Note (Signed)
 Preoperative diagnosis: Cholelithiasis  Postoperative diagnosis: Same  Procedure: Robotic Assisted Laparoscopic Cholecystectomy.   Anesthesia: GETA   Surgeon: Dr. Cesar Coe  Wound Classification: Clean Contaminated  Indications: Patient is a 73 y.o. female developed right upper quadrant pain and on workup was found to have cholelithiasis with a normal common duct. Robotic Assisted Laparoscopic cholecystectomy was elected.  Findings:  Critical view of safety achieved Cystic duct and artery identified, ligated and divided Adequate hemostasis    Description of procedure: The patient was placed on the operating table in the supine position. General anesthesia was induced. A time-out was completed verifying correct patient, procedure, site, positioning, and implant(s) and/or special equipment prior to beginning this procedure. An orogastric tube was placed. The abdomen was prepped and draped in the usual sterile fashion.  An incision was made in a natural skin line below the umbilicus.  The fascia was elevated and the Veress needle inserted. Proper position was confirmed by aspiration and saline meniscus test.  The abdomen was insufflated with carbon dioxide to a pressure of 15 mmHg. The patient tolerated insufflation well. A 8-mm trocar was then inserted in optiview fashion.  The laparoscope was inserted and the abdomen inspected. No injuries from initial trocar placement were noted. Additional trocars were then inserted in the following locations: an 8-mm trocar in the left lateral abdomen, and another two 8-mm trocars to the right side of the abdomen 5 cm appart. The umbilical trocar was changed to a 12 mm trocar all under direct visualization. The abdomen was inspected and no abnormalities were found. The table was placed in the reverse Trendelenburg position with the right side up. The robotic arms were docked and target anatomy identified. Instrument inserted under direct visualization.   Filmy adhesions between the gallbladder and omentum, duodenum and transverse colon were lysed with electrocautery. The dome of the gallbladder was grasped with a prograsp and retracted over the dome of the liver. The infundibulum was also grasped with an atraumatic grasper and retracted toward the right lower quadrant. This maneuver exposed Calot's triangle. The peritoneum overlying the gallbladder infundibulum was then incised and the cystic duct and cystic artery identified and circumferentially dissected. Critical view of safety reviewed before ligating any structure. Firefly images taken to visualize biliary ducts. The cystic duct and cystic artery were then doubly clipped and divided close to the gallbladder.  The gallbladder was then dissected from its peritoneal attachments by electrocautery. Hemostasis was checked and the gallbladder and contained stones were removed using an endoscopic retrieval bag. The gallbladder was passed off the table as a specimen. There was no evidence of bleeding from the gallbladder fossa or cystic artery or leakage of the bile from the cystic duct stump. Secondary trocars were removed under direct vision. No bleeding was noted. The robotic arms were undoked. The scope was withdrawn and the umbilical trocar removed. The abdomen was allowed to collapse. The fascia of the 12mm trocar sites was closed with figure-of-eight 0 vicryl sutures. The skin was closed with subcuticular sutures of 4-0 monocryl and topical skin adhesive. The orogastric tube was removed.  The patient tolerated the procedure well and was taken to the postanesthesia care unit in stable condition.   Specimen: Gallbladder  Complications: None  EBL: 5 mL

## 2024-07-05 NOTE — Anesthesia Preprocedure Evaluation (Signed)
 Anesthesia Evaluation  Patient identified by MRN, date of birth, ID band Patient awake    Reviewed: Allergy & Precautions, NPO status , Patient's Chart, lab work & pertinent test results  History of Anesthesia Complications (+) PONV and history of anesthetic complications  Airway Mallampati: III  TM Distance: >3 FB Neck ROM: full    Dental  (+) Chipped   Pulmonary shortness of breath, asthma , sleep apnea , COPD,  COPD inhaler   Pulmonary exam normal        Cardiovascular hypertension, + CAD  Normal cardiovascular exam+ dysrhythmias (frequent PVCs) + Valvular Problems/Murmurs MVP, MR and AI      Neuro/Psych  Headaches PSYCHIATRIC DISORDERS Anxiety Depression       GI/Hepatic Neg liver ROS, hiatal hernia,GERD  ,,  Endo/Other  negative endocrine ROSdiabetes    Renal/GU      Musculoskeletal  (+) Arthritis ,    Abdominal   Peds  Hematology  (+) Blood dyscrasia, anemia   Anesthesia Other Findings Past Medical History: No date: Allergy     Comment:  hay fever No date: Anemia 04/08/2014: Ankle fracture, left No date: Anxiety and depression No date: Arthritis No date: Asthma     Comment:  well controlled  No date: Back pain No date: Blood transfusion abn reaction or complication, no procedure  mishap No date: Blood transfusion without reported diagnosis No date: Cerebral aneurysm No date: Chest pain No date: Cholelithiasis without cholecystitis No date: COPD (chronic obstructive pulmonary disease) (HCC) No date: Coronary artery disease 08/2020: COVID-19 virus infection No date: Depression No date: Diabetes mellitus 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, lumbar No date: Dysrhythmia     Comment:  pvc's No date: Fatty liver No date: GERD (gastroesophageal reflux disease) No date: Headache(784.0) No date: History of hiatal hernia     Comment:  small No date: Hyperlipidemia No date: Hypertension No date: Joint  pain No date: Low vitamin B12 level No date: Low vitamin D  level No date: Migraine No date: Mitral valve prolapse No date: Obstructive sleep apnea     Comment:  CPAP No date: Osteoarthritis No date: Palpitations No date: PONV (postoperative nausea and vomiting)     Comment:  h/o in the past 04/06/2017: Sleep apnea     Comment:  uses CPAP No date: SOB (shortness of breath) No date: UTI (lower urinary tract infection) No date: Vertigo     Comment:  1 episode, several years ago No date: Vitamin B 12 deficiency No date: Vitamin D  deficiency No date: Wears glasses  Past Surgical History: 1977: APPENDECTOMY 02/19/2023: CATARACT EXTRACTION W/PHACO; Right     Comment:  Procedure: CATARACT EXTRACTION PHACO AND INTRAOCULAR               LENS PLACEMENT (IOC) RIGHT DIABETIC  7.13  00:46.7;                Surgeon: Mittie Gaskin, MD;  Location: [facility]               [facility]ERY [facility];  Service: Ophthalmology;  Laterality:               Right;  Diabetic 03/05/2023: CATARACT EXTRACTION W/PHACO; Left     Comment:  Procedure: CATARACT EXTRACTION PHACO AND INTRAOCULAR               LENS PLACEMENT (IOC) LEFT DIABETIC  6.03  00:36.4;                Surgeon: Mittie Gaskin, MD;  Location: [facility]               [facility];  Service: Ophthalmology;  Laterality: Left;              Diabetic last 11/17/2014: COLONOSCOPY 11/01/2020: INGUINAL HERNIA REPAIR; Right     Comment:  Procedure: HERNIA REPAIR INGUINAL ADULT;  Surgeon:               Dessa Reyes ORN, MD;  Location: [facility] OR[facility];  Service:               General;  Laterality: Right; No date: JOINT REPLACEMENT 03/03/2019: KNEE ARTHROPLASTY; Right     Comment:  Procedure: COMPUTER ASSISTED TOTAL KNEE               ARTHROPLASTY-RIGHT;  Surgeon: Mardee Lynwood SQUIBB, MD;                Location: [facility];  Service: Orthopedics;  Laterality:               Right; 02/06/2018: LUMBAR LAMINECTOMY/DECOMPRESSION MICRODISCECTOMY; Right     Comment:  Procedure:  Lumbar two Hemilaminectomy for Discectomy;                Surgeon: Gillie Duncans, MD;  Location: [facility];  Service:               Neurosurgery;  Laterality: Right; 04/08/2014: ORIF ANKLE FRACTURE; Left     Comment:  Procedure: OPEN REDUCTION INTERNAL FIXATION (ORIF) ANKLE              FRACTURE LEFT ANKLE FRACTURE OPEN TREATMENT BIMALLEOLAR               ANKLE INCLUDES INTERNAL FIXATION;  Surgeon: Fonda SQUIBB Olmsted, MD;  Location: [facility];                Service: Orthopedics;  Laterality: Left; 1980: PAROTIDECTOMY     Comment:  left No date: POLYPECTOMY No date: TONSILLECTOMY AND ADENOIDECTOMY     Comment:  as a child No date: TOTAL KNEE ARTHROPLASTY; Bilateral     Comment:  left 05/1976: TUBAL LIGATION     Reproductive/Obstetrics negative OB ROS                              Anesthesia Physical Anesthesia Plan  ASA: 3  Anesthesia Plan: General ETT   Post-op Pain Management: Toradol  IV (intra-op)* and Ofirmev  IV (intra-op)*   Induction: Intravenous  PONV Risk Score and Plan: 4 or greater and Ondansetron , Treatment may vary due to age or medical condition, Propofol  infusion, TIVA and Dexamethasone   Airway Management Planned: Oral ETT  Additional Equipment:   Intra-op Plan:   Post-operative Plan: Extubation in OR  Informed Consent: I have reviewed the patients History and Physical, chart, labs and discussed the procedure including the risks, benefits and alternatives for the proposed anesthesia with the patient or authorized representative who has indicated his/her understanding and acceptance.     Dental Advisory Given  Plan Discussed with: Anesthesiologist, CRNA and Surgeon  Anesthesia Plan Comments: (Patient consented for risks of anesthesia including but not limited to:  - adverse reactions to medications - damage to eyes, teeth, lips or other oral mucosa - nerve damage due to positioning  - sore throat or  hoarseness - Damage to heart, brain, nerves, lungs,  other parts of body or loss of life  Patient voiced understanding and assent.)         Anesthesia Quick Evaluation

## 2024-07-06 ENCOUNTER — Encounter: Payer: Self-pay | Admitting: General Surgery

## 2024-07-06 NOTE — Anesthesia Postprocedure Evaluation (Signed)
 Anesthesia Post Note  Patient: Vanessa Higgins  Procedure(s) Performed: CHOLECYSTECTOMY, ROBOT-ASSISTED, LAPAROSCOPIC (Abdomen) INDOCYANINE GREEN  FLUORESCENCE IMAGING (ICG)  Patient location during evaluation: PACU Anesthesia Type: General Level of consciousness: awake and alert Pain management: pain level controlled Vital Signs Assessment: post-procedure vital signs reviewed and stable Respiratory status: spontaneous breathing, nonlabored ventilation, respiratory function stable and patient connected to nasal cannula oxygen Cardiovascular status: blood pressure returned to baseline and stable Postop Assessment: no apparent nausea or vomiting Anesthetic complications: no   There were no known notable events for this encounter.   Last Vitals:  Vitals:   07/05/24 1400 07/05/24 1417  BP: 136/60 (!) 114/53  Pulse: (!) 58 (!) 55  Resp: 14 17  Temp: (!) 36.1 C (!) 36.3 C  SpO2: 92% 94%    Last Pain:  Vitals:   07/05/24 1417  TempSrc: Temporal  PainSc: 5                  Vanessa Higgins

## 2024-07-08 LAB — SURGICAL PATHOLOGY

## 2024-07-14 ENCOUNTER — Ambulatory Visit: Admitting: Family

## 2024-07-19 ENCOUNTER — Other Ambulatory Visit: Payer: Self-pay | Admitting: Family

## 2024-07-19 ENCOUNTER — Other Ambulatory Visit: Payer: Self-pay | Admitting: Internal Medicine

## 2024-07-19 DIAGNOSIS — E1169 Type 2 diabetes mellitus with other specified complication: Secondary | ICD-10-CM

## 2024-07-19 DIAGNOSIS — E1159 Type 2 diabetes mellitus with other circulatory complications: Secondary | ICD-10-CM

## 2024-08-03 ENCOUNTER — Encounter: Payer: Self-pay | Admitting: Family

## 2024-08-03 ENCOUNTER — Ambulatory Visit: Admitting: Family

## 2024-08-03 VITALS — BP 120/66 | HR 77 | Temp 98.0°F | Ht 63.0 in | Wt 188.8 lb

## 2024-08-03 DIAGNOSIS — Z8639 Personal history of other endocrine, nutritional and metabolic disease: Secondary | ICD-10-CM | POA: Diagnosis not present

## 2024-08-03 DIAGNOSIS — Z794 Long term (current) use of insulin: Secondary | ICD-10-CM

## 2024-08-03 DIAGNOSIS — Z1231 Encounter for screening mammogram for malignant neoplasm of breast: Secondary | ICD-10-CM | POA: Diagnosis not present

## 2024-08-03 DIAGNOSIS — E538 Deficiency of other specified B group vitamins: Secondary | ICD-10-CM | POA: Diagnosis not present

## 2024-08-03 DIAGNOSIS — I10 Essential (primary) hypertension: Secondary | ICD-10-CM

## 2024-08-03 DIAGNOSIS — E1169 Type 2 diabetes mellitus with other specified complication: Secondary | ICD-10-CM | POA: Diagnosis not present

## 2024-08-03 DIAGNOSIS — Z780 Asymptomatic menopausal state: Secondary | ICD-10-CM

## 2024-08-03 LAB — B12 AND FOLATE PANEL
Folate: 18.9 ng/mL (ref >5.9)
Vitamin B-12: 191 pg/mL — ABNORMAL LOW (ref 211–911)

## 2024-08-03 LAB — POCT GLYCOSYLATED HEMOGLOBIN (HGB A1C): Hemoglobin A1C: 7.6 % — AB (ref 4.0–5.6)

## 2024-08-03 LAB — BASIC METABOLIC PANEL WITH GFR
BUN: 18 mg/dL (ref 6–23)
CO2: 29 meq/L (ref 19–32)
Calcium: 9.6 mg/dL (ref 8.4–10.5)
Chloride: 101 meq/L (ref 96–112)
Creatinine, Ser: 0.98 mg/dL (ref 0.40–1.20)
GFR: 56.66 mL/min — ABNORMAL LOW (ref >60.00)
Glucose, Bld: 97 mg/dL (ref 70–99)
Potassium: 3.8 meq/L (ref 3.5–5.1)
Sodium: 139 meq/L (ref 135–145)

## 2024-08-03 LAB — VITAMIN D 25 HYDROXY (VIT D DEFICIENCY, FRACTURES): VITD: 47.42 ng/mL (ref 30.00–100.00)

## 2024-08-03 MED ORDER — ONETOUCH VERIO VI STRP: ORAL_STRIP | 3 refills | Status: DC

## 2024-08-03 MED ORDER — LANTUS SOLOSTAR 100 UNIT/ML ~~LOC~~ SOPN: 22.0000 [IU] | PEN_INJECTOR | SUBCUTANEOUS | 3 refills | Status: DC

## 2024-08-03 NOTE — Progress Notes (Unsigned)
 Assessment & Plan:  Type 2 diabetes mellitus with other specified complication, with long-term current use of insulin  (HCC) Assessment & Plan: Chronic, stable. Tolerating Ozempic , 18 clicks ( which is 0.25mg ).  Continue Ozempic  28 clicks, Lantus  to 22 units every day,Farxiga  10 mg daily, metformin  1000 mg twice daily.  Orders: -     Lantus  SoloStar; Inject 22 Units into the skin every morning.  Dispense: 15 mL; Refill: 3 -     OneTouch Verio; Use as instructed  Dispense: 100 strip; Refill: 3 -     POCT glycosylated hemoglobin (Hb A1C) -     Basic metabolic panel with GFR -     A87 and Folate Panel  Asymptomatic postmenopausal state -     DG Bone Density; Future  Encounter for screening mammogram for malignant neoplasm of breast -     3D Screening Mammogram, Left and Right; Future  History of vitamin D  deficiency -     VITAMIN D  25 Hydroxy (Vit-D Deficiency, Fractures)  Low serum vitamin B12 -     B12 and Folate Panel  Essential hypertension Assessment & Plan: Chronic, stable.  Continue diltiazem  240 mg, telmisartan -hydrochlorothiazide 80-25 mg      Return precautions given.   Risks, benefits, and alternatives of the medications and treatment plan prescribed today were discussed, and patient expressed understanding.   Education regarding symptom management and diagnosis given to patient on AVS either electronically or printed.  Return in about 3 months (around 11/03/2024).  [name], FNP  Subjective:    Patient ID: Vanessa Higgins, female    DOB: 04-Apr-1951, 73 y.o.   MRN: [id number]  CC: Vanessa Higgins is a 73 y.o. female who presents today for follow up.   HPI: Follow up DM  FBG 144, 134, 139, post prandial 158, 161.  She is tolerating Ozempic , 28 clicks ( which is halfway between 0.25mg  and 0.5mg ). Compliant Lantus  to 22 units every day,Farxiga  10 mg daily, metformin  1000 mg twice daily. Denies hypoglycemic episode    S/p cholecystectomy  06/2024  Allergies: Sulfa antibiotics, Lisinopril, Ozempic  (0.25 or 0.5 mg-dose) [semaglutide (0.25 or 0.5mg -dos)], and Ramipril  Current Outpatient Medications on File Prior to Visit  Medication Sig Dispense Refill   albuterol  (VENTOLIN  HFA) 108 (90 Base) MCG/ACT inhaler TAKE 2 PUFFS BY MOUTH EVERY 6 HOURS AS NEEDED FOR WHEEZE OR SHORTNESS OF BREATH 18 each 11   aspirin  EC 81 MG tablet Take 1 tablet (81 mg total) by mouth daily. Swallow whole. 90 tablet 3   atorvastatin  (LIPITOR) 20 MG tablet TAKE 1 TABLET BY MOUTH EVERYDAY AT BEDTIME 90 tablet 3   BD PEN NEEDLE MICRO U/F 32G X 6 MM MISC USE 1 PEN NEEDLE DAILY 100 each 3   Budeson-Glycopyrrol-Formoterol  (BREZTRI  AEROSPHERE) 160-9-4.8 MCG/ACT AERO Inhale 2 puffs into the lungs in the morning and at bedtime. 32.1 g 3   Cholecalciferol  (VITAMIN D ) 125 MCG (5000 UT) CAPS Take 5,000 Units by mouth every Monday,Wednesday,Friday, and Sunday at 6 PM AND 10,000 Units every Tuesday, Thursday, and Saturday at 6 PM.     dapagliflozin  propanediol (FARXIGA ) 10 MG TABS tablet Take 1 tablet (10 mg total) by mouth daily before breakfast. 90 tablet 3   diltiazem  (CARDIZEM  CD) 240 MG 24 hr capsule TAKE 1 CAPSULE BY MOUTH EVERY DAY 90 capsule 3   diphenoxylate -atropine  (LOMOTIL ) 2.5-0.025 MG tablet Take 1 tablet by mouth every 6 (six) hours as needed for diarrhea or loose stools. 30 tablet 1   loratadine  (CLARITIN )  10 MG tablet Take 10 mg by mouth at bedtime.      metFORMIN  (GLUCOPHAGE -XR) 500 MG 24 hr tablet TAKE 2 TABLETS BY MOUTH TWICE A DAY 360 tablet 3   montelukast  (SINGULAIR ) 10 MG tablet TAKE 1 TABLET BY MOUTH EVERYDAY AT BEDTIME 90 tablet 1   ondansetron  (ZOFRAN -ODT) 4 MG disintegrating tablet Take 1 tablet (4 mg total) by mouth every 8 (eight) hours as needed for nausea or vomiting. 30 tablet 0   OZEMPIC , 0.25 OR 0.5 MG/DOSE, 2 MG/3ML SOPN SMARTSIG:0.25 Milligram(s) SUB-Q Once a Week     pantoprazole  (PROTONIX ) 20 MG tablet TAKE 1 TABLET (20 MG TOTAL) BY  MOUTH EVERY MORNING. TAKE 30 MINUTES TO HOUR BEFORE BREAKFAST 90 tablet 3   Probiotic Product (PROBIOTIC PO) Take 1 capsule by mouth daily.     Semaglutide ,0.25 or 0.5MG /DOS, (OZEMPIC , 0.25 OR 0.5 MG/DOSE,) 2 MG/1.5ML SOPN Inject 0.25 mg into the skin once a week. 3 mL 3   telmisartan -hydrochlorothiazide (MICARDIS  HCT) 80-12.5 MG tablet TAKE 1 TABLET BY MOUTH EVERY DAY 90 tablet 3   venlafaxine  XR (EFFEXOR -XR) 150 MG 24 hr capsule TAKE 1 CAPSULE BY MOUTH DAILY WITH BREAKFAST. 90 capsule 3   vitamin B-12 (CYANOCOBALAMIN ) 1000 MCG tablet Take 1,000 mcg by mouth at bedtime.     No current facility-administered medications on file prior to visit.    Review of Systems  Constitutional:  Negative for chills and fever.  Respiratory:  Negative for cough.   Cardiovascular:  Negative for chest pain and palpitations.  Gastrointestinal:  Negative for nausea and vomiting.      Objective:    BP 120/66   Pulse 77   Temp 98 F (36.7 C)   Ht 5' 3 (1.6 m)   Wt 188 lb 12.8 oz (85.6 kg)   SpO2 96%   BMI 33.44 kg/m  BP Readings from Last 3 Encounters:  08/03/24 120/66  07/05/24 (!) 114/53  06/25/24 (!) 128/56   Wt Readings from Last 3 Encounters:  08/03/24 188 lb 12.8 oz (85.6 kg)  06/25/24 194 lb (88 kg)  06/23/24 194 lb (88 kg)    Physical Exam Vitals reviewed.  Constitutional:      Appearance: She is well-developed.  Eyes:     Conjunctiva/sclera: Conjunctivae normal.  Cardiovascular:     Rate and Rhythm: Normal rate and regular rhythm.     Pulses: Normal pulses.     Heart sounds: Normal heart sounds.  Pulmonary:     Effort: Pulmonary effort is normal.     Breath sounds: Normal breath sounds. No wheezing, rhonchi or rales.  Skin:    General: Skin is warm and dry.  Neurological:     Mental Status: She is alert.  Psychiatric:        Speech: Speech normal.        Behavior: Behavior normal.        Thought Content: Thought content normal.

## 2024-08-03 NOTE — Telephone Encounter (Signed)
 Call [employer] at [phone].   Pt has not been getting ozempic  for months.  She only received - delivery is to our office- one shipment in January 2025.

## 2024-08-04 NOTE — Assessment & Plan Note (Signed)
Chronic, stable.  Continue diltiazem 240 mg, telmisartan-hydrochlorothiazide 80-25 mg 

## 2024-08-04 NOTE — Assessment & Plan Note (Addendum)
 Chronic, stable. Discussed dietary discretion. FBG reflect improvement and we opted not to escalate regimen today.  Continue Ozempic  28 clicks, Lantus  to 22 units every day,Farxiga  10 mg daily, metformin  1000 mg twice daily.

## 2024-08-05 ENCOUNTER — Telehealth: Payer: Self-pay

## 2024-08-05 NOTE — Telephone Encounter (Signed)
 Reorder form completed and faxed to Provider for review and signature. Will forward to [employer] when received back.

## 2024-08-06 ENCOUNTER — Other Ambulatory Visit: Payer: Self-pay

## 2024-08-06 DIAGNOSIS — Z794 Long term (current) use of insulin: Secondary | ICD-10-CM

## 2024-08-06 MED ORDER — ONETOUCH VERIO VI STRP: ORAL_STRIP | 3 refills | Status: AC

## 2024-08-06 NOTE — Telephone Encounter (Signed)
 Spoke to pt she has been made aware, she also asked if we could send in test strips, sent in to pharmacy

## 2024-08-09 NOTE — Telephone Encounter (Signed)
 Faxed completed refill/ reorder form to [facility] [facility] for Ozempic .

## 2024-08-10 ENCOUNTER — Ambulatory Visit: Payer: Self-pay | Admitting: Family

## 2024-08-10 DIAGNOSIS — R899 Unspecified abnormal finding in specimens from other organs, systems and tissues: Secondary | ICD-10-CM

## 2024-08-10 DIAGNOSIS — E538 Deficiency of other specified B group vitamins: Secondary | ICD-10-CM

## 2024-08-12 ENCOUNTER — Other Ambulatory Visit (HOSPITAL_COMMUNITY): Payer: Self-pay | Admitting: Neurosurgery

## 2024-08-12 DIAGNOSIS — G8929 Other chronic pain: Secondary | ICD-10-CM | POA: Diagnosis not present

## 2024-08-12 DIAGNOSIS — M25512 Pain in left shoulder: Secondary | ICD-10-CM | POA: Diagnosis not present

## 2024-08-12 DIAGNOSIS — I671 Cerebral aneurysm, nonruptured: Secondary | ICD-10-CM

## 2024-08-24 ENCOUNTER — Telehealth: Payer: Self-pay

## 2024-08-24 DIAGNOSIS — M25812 Other specified joint disorders, left shoulder: Secondary | ICD-10-CM | POA: Diagnosis not present

## 2024-08-24 NOTE — Telephone Encounter (Signed)
 LVM to call back to inform pt that her Ozempic    (4 BOXES) from pt assistance is here and can be picked up anytime between 9-5 pm M-F

## 2024-08-25 ENCOUNTER — Other Ambulatory Visit: Payer: Self-pay | Admitting: Pharmacist

## 2024-08-25 DIAGNOSIS — J45909 Unspecified asthma, uncomplicated: Secondary | ICD-10-CM

## 2024-08-25 DIAGNOSIS — E1169 Type 2 diabetes mellitus with other specified complication: Secondary | ICD-10-CM

## 2024-08-25 NOTE — Progress Notes (Unsigned)
 Patient Assistance Program (PAP)   Fax received per Medication Access team from [employer] program.  Requested item(s): Breztri , Farxiga   Refill pended to PCP for Breztri  and Farxiga  to [facility] pharmacy ([employer])

## 2024-08-26 MED ORDER — DAPAGLIFLOZIN PROPANEDIOL 10 MG PO TABS
10.0000 mg | ORAL_TABLET | Freq: Every day | ORAL | 3 refills | Status: AC
Start: 2024-08-26 — End: ?

## 2024-08-26 MED ORDER — BREZTRI AEROSPHERE 160-9-4.8 MCG/ACT IN AERO: 2.0000 | INHALATION_SPRAY | Freq: Two times a day (BID) | RESPIRATORY_TRACT | 3 refills | Status: AC

## 2024-08-31 DIAGNOSIS — M25812 Other specified joint disorders, left shoulder: Secondary | ICD-10-CM | POA: Diagnosis not present

## 2024-08-31 NOTE — Telephone Encounter (Signed)
Pt picked up meds today

## 2024-09-01 ENCOUNTER — Ambulatory Visit: Admitting: Internal Medicine

## 2024-09-06 ENCOUNTER — Ambulatory Visit (HOSPITAL_COMMUNITY)

## 2024-09-06 ENCOUNTER — Encounter (HOSPITAL_COMMUNITY): Payer: Self-pay

## 2024-09-17 DIAGNOSIS — M25812 Other specified joint disorders, left shoulder: Secondary | ICD-10-CM | POA: Diagnosis not present

## 2024-09-21 ENCOUNTER — Ambulatory Visit
Admission: RE | Admit: 2024-09-21 | Discharge: 2024-09-21 | Disposition: A | Discharge status: Home / Self Care | Source: Ambulatory Visit | Attending: Neurosurgery | Admitting: Neurosurgery

## 2024-09-21 DIAGNOSIS — I6521 Occlusion and stenosis of right carotid artery: Secondary | ICD-10-CM | POA: Diagnosis not present

## 2024-09-21 DIAGNOSIS — I671 Cerebral aneurysm, nonruptured: Secondary | ICD-10-CM | POA: Diagnosis not present

## 2024-09-21 DIAGNOSIS — R9082 White matter disease, unspecified: Secondary | ICD-10-CM | POA: Diagnosis not present

## 2024-09-21 DIAGNOSIS — I672 Cerebral atherosclerosis: Secondary | ICD-10-CM | POA: Diagnosis not present

## 2024-09-21 MED ORDER — IOHEXOL 350 MG/ML SOLN
75.0000 mL | Freq: Once | INTRAVENOUS | Status: AC | PRN
  Administered 2024-09-21: 75 mL via INTRAVENOUS

## 2024-10-05 ENCOUNTER — Encounter: Payer: Self-pay | Admitting: Pharmacist

## 2024-10-06 DIAGNOSIS — E119 Type 2 diabetes mellitus without complications: Secondary | ICD-10-CM | POA: Diagnosis not present

## 2024-10-06 DIAGNOSIS — Z961 Presence of intraocular lens: Secondary | ICD-10-CM | POA: Diagnosis not present

## 2024-10-06 LAB — OPHTHALMOLOGY REPORT-SCANNED

## 2024-10-11 ENCOUNTER — Ambulatory Visit: Admitting: Pulmonary Disease

## 2024-10-12 ENCOUNTER — Other Ambulatory Visit

## 2024-10-13 ENCOUNTER — Encounter: Payer: Self-pay | Admitting: Gastroenterology

## 2024-10-13 ENCOUNTER — Other Ambulatory Visit (INDEPENDENT_AMBULATORY_CARE_PROVIDER_SITE_OTHER)

## 2024-10-13 DIAGNOSIS — E538 Deficiency of other specified B group vitamins: Secondary | ICD-10-CM | POA: Diagnosis not present

## 2024-10-13 DIAGNOSIS — R899 Unspecified abnormal finding in specimens from other organs, systems and tissues: Secondary | ICD-10-CM

## 2024-10-13 NOTE — Addendum Note (Signed)
 Addended by: TANDA HARVEY D on: 10/13/2024 02:30 PM   Modules accepted: Orders

## 2024-10-13 NOTE — Addendum Note (Signed)
 Addended by: TANDA HARVEY D on: 10/13/2024 02:26 PM   Modules accepted: Orders

## 2024-10-14 LAB — INTRINSIC FACTOR ANTIBODIES: Intrinsic Factor Abs, Serum: 1 [AU]/ml (ref 0.0–1.1)

## 2024-10-17 LAB — METHYLMALONIC ACID, SERUM: Methylmalonic Acid, Quant: 168 nmol/L (ref 69–390)

## 2024-10-17 LAB — HOMOCYSTEINE: Homocysteine: 11.9 umol/L (ref <=13.4)

## 2024-10-19 LAB — BASIC METABOLIC PANEL WITH GFR
BUN/Creatinine Ratio: 19 (ref 12–28)
BUN: 18 mg/dL (ref 8–27)
CO2: 26 mmol/L (ref 20–29)
Calcium: 10.3 mg/dL (ref 8.7–10.3)
Chloride: 94 mmol/L — ABNORMAL LOW (ref 96–106)
Creatinine, Ser: 0.94 mg/dL (ref 0.57–1.00)
Glucose: 107 mg/dL — ABNORMAL HIGH (ref 70–99)
Potassium: 3.9 mmol/L (ref 3.5–5.2)
Sodium: 138 mmol/L (ref 134–144)
eGFR: 63 mL/min/1.73 (ref >59)

## 2024-10-19 LAB — CELIAC DISEASE AB SCREEN W/RFX
Antigliadin Abs, IgA: 9 U (ref 0–19)
IgA/Immunoglobulin A, Serum: 346 mg/dL (ref 64–422)
Transglutaminase IgA: <2 U/mL (ref 0–3)

## 2024-10-19 LAB — B12 AND FOLATE PANEL
Folate: >20 ng/mL (ref >3.0)
Vitamin B-12: 482 pg/mL (ref 232–1245)

## 2024-10-21 ENCOUNTER — Ambulatory Visit: Payer: Self-pay | Admitting: Family

## 2024-10-21 DIAGNOSIS — E538 Deficiency of other specified B group vitamins: Secondary | ICD-10-CM

## 2024-10-25 ENCOUNTER — Ambulatory Visit
Admission: RE | Admit: 2024-10-25 | Discharge: 2024-10-25 | Disposition: A | Source: Ambulatory Visit | Attending: Family

## 2024-10-25 ENCOUNTER — Ambulatory Visit
Admission: RE | Admit: 2024-10-25 | Discharge: 2024-10-25 | Disposition: A | Discharge status: Home / Self Care | Source: Ambulatory Visit | Attending: Family | Admitting: Family

## 2024-10-25 DIAGNOSIS — Z780 Asymptomatic menopausal state: Secondary | ICD-10-CM | POA: Diagnosis not present

## 2024-10-25 DIAGNOSIS — Z1231 Encounter for screening mammogram for malignant neoplasm of breast: Secondary | ICD-10-CM | POA: Insufficient documentation

## 2024-10-26 DIAGNOSIS — Z6832 Body mass index (BMI) 32.0-32.9, adult: Secondary | ICD-10-CM | POA: Diagnosis not present

## 2024-10-26 DIAGNOSIS — I671 Cerebral aneurysm, nonruptured: Secondary | ICD-10-CM | POA: Diagnosis not present

## 2024-10-30 ENCOUNTER — Other Ambulatory Visit: Payer: Self-pay | Admitting: Family

## 2024-10-30 DIAGNOSIS — Z794 Long term (current) use of insulin: Secondary | ICD-10-CM

## 2024-11-01 NOTE — Telephone Encounter (Signed)
 LVM and my chart message

## 2024-11-02 ENCOUNTER — Telehealth: Payer: Self-pay

## 2024-11-02 NOTE — Telephone Encounter (Signed)
 PAP: Patient assistance application for Breztri  and Farxiga  through [employer] ([employer]) has been mailed to pt's home address on file. Provider portion of application will be faxed to provider's office.

## 2024-11-04 ENCOUNTER — Encounter: Payer: Self-pay | Admitting: Family

## 2024-11-04 ENCOUNTER — Ambulatory Visit (INDEPENDENT_AMBULATORY_CARE_PROVIDER_SITE_OTHER): Admitting: Family

## 2024-11-04 VITALS — BP 120/70 | HR 78 | Temp 98.1°F | Ht 63.0 in | Wt 187.0 lb

## 2024-11-04 DIAGNOSIS — E785 Hyperlipidemia, unspecified: Secondary | ICD-10-CM

## 2024-11-04 DIAGNOSIS — Z794 Long term (current) use of insulin: Secondary | ICD-10-CM | POA: Diagnosis not present

## 2024-11-04 DIAGNOSIS — E1165 Type 2 diabetes mellitus with hyperglycemia: Secondary | ICD-10-CM | POA: Diagnosis not present

## 2024-11-04 DIAGNOSIS — E1169 Type 2 diabetes mellitus with other specified complication: Secondary | ICD-10-CM

## 2024-11-04 DIAGNOSIS — I10 Essential (primary) hypertension: Secondary | ICD-10-CM | POA: Diagnosis not present

## 2024-11-04 MED ORDER — LANTUS SOLOSTAR 100 UNIT/ML ~~LOC~~ SOPN
20.0000 [IU] | PEN_INJECTOR | SUBCUTANEOUS | Status: DC
Start: 2024-11-04 — End: 2024-11-16

## 2024-11-04 MED ORDER — OZEMPIC (0.25 OR 0.5 MG/DOSE) 2 MG/1.5ML ~~LOC~~ SOPN: 1.0000 mg | PEN_INJECTOR | SUBCUTANEOUS | 3 refills | Status: DC

## 2024-11-04 NOTE — Telephone Encounter (Signed)
 Completed and given to jenate

## 2024-11-04 NOTE — Telephone Encounter (Signed)
 Faxed on 11/04/24

## 2024-11-04 NOTE — Assessment & Plan Note (Signed)
 Anticipate stable.  Pending A1c.  Continue ozempic  1mg , lantus  20 units, farxiga  10mg .

## 2024-11-04 NOTE — Assessment & Plan Note (Signed)
 Chronic, stable.  Continue diltiazem  240 mg as managed by Dr End.  Due to episodic dizziness and low end BP readings in addition to weight loss, advised to decrease telmisartan -hydrochlorothiazide  80-12.5 mg by half.  She will monitor blood pressure at home

## 2024-11-04 NOTE — Patient Instructions (Addendum)
 Trial HALF tablet of micardis  blood pressure medication Monitor for elevated blood pressure and dizziness  Nice to see you!

## 2024-11-04 NOTE — Progress Notes (Signed)
 Assessment & Plan:  Type 2 diabetes mellitus with hyperglycemia, with long-term current use of insulin  (HCC) -     Ozempic  (0.25 or 0.5 MG/DOSE); Inject 1 mg into the skin once a week.  Dispense: 3 mL; Refill: 3 -     Hemoglobin A1c; Future -     Lipid panel; Future -     Microalbumin / creatinine urine ratio; Future  Essential hypertension Assessment & Plan: Chronic, stable.  Continue diltiazem  240 mg as managed by Dr End.  Due to episodic dizziness and low end BP readings in addition to weight loss, advised to decrease telmisartan -hydrochlorothiazide  80-12.5 mg by half.  She will monitor blood pressure at home  Orders: -     TSH; Future  Type 2 diabetes mellitus with other specified complication, with long-term current use of insulin  (HCC) -     Lantus  SoloStar; Inject 20 Units into the skin every morning.  Hyperlipidemia associated with type 2 diabetes mellitus (HCC) Assessment & Plan: Anticipate stable.  Pending A1c.  Continue ozempic  1mg , lantus  20 units, farxiga  10mg .       Return precautions given.   Risks, benefits, and alternatives of the medications and treatment plan prescribed today were discussed, and patient expressed understanding.   Education regarding symptom management and diagnosis given to patient on AVS either electronically or printed.  Return in about 3 months (around 02/04/2025).  Vanessa Northern, FNP  Subjective:    Patient ID: Vanessa Higgins, female    DOB: 07/14/1951, 73 y.o.   MRN: [id number]  CC: Vanessa Higgins is a 73 y.o. female who presents today for follow up.   HPI: Feels well today Stopped metformin  due to diarrhea, since resolved.   Compliant ozempic  1mg , lantus  20 units, farxiga  10mg . Tolerating well.   FBG 149, 157, 10, 160.   BP at home 115/54, 109/64, 10/60.  Very occasional dizziness. Denies syncope, palpitations, CP  Continues to follow with neurology, Dr. Cabbell for surveillance of cerebral aneurysm; last seen  10/26/2024  She is planning on colonoscopy, not EGD, with Dr Charlanne.  Allergies: Sulfa antibiotics, Lisinopril, Metformin  and related, Ozempic  (0.25 or 0.5 mg-dose) [semaglutide (0.25 or 0.5mg -dos)], and Ramipril  Current Outpatient Medications on File Prior to Visit  Medication Sig Dispense Refill   albuterol  (VENTOLIN  HFA) 108 (90 Base) MCG/ACT inhaler TAKE 2 PUFFS BY MOUTH EVERY 6 HOURS AS NEEDED FOR WHEEZE OR SHORTNESS OF BREATH 18 each 11   aspirin  EC 81 MG tablet Take 1 tablet (81 mg total) by mouth daily. Swallow whole. 90 tablet 3   atorvastatin  (LIPITOR) 20 MG tablet TAKE 1 TABLET BY MOUTH EVERYDAY AT BEDTIME 90 tablet 3   BD PEN NEEDLE MICRO U/F 32G X 6 MM MISC USE 1 PEN NEEDLE DAILY 100 each 3   budesonide-glycopyrrolate -formoterol  (BREZTRI  AEROSPHERE) 160-9-4.8 MCG/ACT AERO inhaler Inhale 2 puffs into the lungs in the morning and at bedtime. 3 each 3   Cholecalciferol  (VITAMIN D ) 125 MCG (5000 UT) CAPS Take 5,000 Units by mouth every Monday,Wednesday,Friday, and Sunday at 6 PM AND 10,000 Units every Tuesday, Thursday, and Saturday at 6 PM.     dapagliflozin  propanediol (FARXIGA ) 10 MG TABS tablet Take 1 tablet (10 mg total) by mouth daily before breakfast. 90 tablet 3   diltiazem  (CARDIZEM  CD) 240 MG 24 hr capsule TAKE 1 CAPSULE BY MOUTH EVERY DAY 90 capsule 3   diphenoxylate -atropine  (LOMOTIL ) 2.5-0.025 MG tablet Take 1 tablet by mouth every 6 (six) hours as needed for diarrhea or  loose stools. 30 tablet 1   glucose blood (ONETOUCH VERIO) test strip Use as instructed 100 strip 3   loratadine  (CLARITIN ) 10 MG tablet Take 10 mg by mouth at bedtime.      montelukast  (SINGULAIR ) 10 MG tablet TAKE 1 TABLET BY MOUTH EVERYDAY AT BEDTIME 90 tablet 1   ondansetron  (ZOFRAN -ODT) 4 MG disintegrating tablet Take 1 tablet (4 mg total) by mouth every 8 (eight) hours as needed for nausea or vomiting. 30 tablet 0   pantoprazole  (PROTONIX ) 20 MG tablet TAKE 1 TABLET (20 MG TOTAL) BY MOUTH EVERY MORNING.  TAKE 30 MINUTES TO HOUR BEFORE BREAKFAST 90 tablet 3   Probiotic Product (PROBIOTIC PO) Take 1 capsule by mouth daily.     telmisartan -hydrochlorothiazide (MICARDIS  HCT) 80-12.5 MG tablet TAKE 1 TABLET BY MOUTH EVERY DAY 90 tablet 3   venlafaxine  XR (EFFEXOR -XR) 150 MG 24 hr capsule TAKE 1 CAPSULE BY MOUTH DAILY WITH BREAKFAST. 90 capsule 3   vitamin B-12 (CYANOCOBALAMIN ) 1000 MCG tablet Take 1,000 mcg by mouth at bedtime.     No current facility-administered medications on file prior to visit.    Review of Systems  Constitutional:  Negative for chills and fever.  Respiratory:  Negative for cough.   Cardiovascular:  Negative for chest pain and palpitations.  Gastrointestinal:  Negative for nausea and vomiting.  Neurological:  Negative for syncope.      Objective:    BP 120/70   Pulse 78   Temp 98.1 F (36.7 C) (Oral)   Ht 5' 3 (1.6 m)   Wt 187 lb (84.8 kg)   SpO2 96%   BMI 33.13 kg/m  BP Readings from Last 3 Encounters:  11/04/24 120/70  08/03/24 120/66  07/05/24 (!) 114/53   Wt Readings from Last 3 Encounters:  11/04/24 187 lb (84.8 kg)  08/03/24 188 lb 12.8 oz (85.6 kg)  06/25/24 194 lb (88 kg)    Physical Exam Vitals reviewed.  Constitutional:      Appearance: She is well-developed.  Eyes:     Conjunctiva/sclera: Conjunctivae normal.  Cardiovascular:     Rate and Rhythm: Normal rate and regular rhythm.     Pulses: Normal pulses.     Heart sounds: Normal heart sounds.  Pulmonary:     Effort: Pulmonary effort is normal.     Breath sounds: Normal breath sounds. No wheezing, rhonchi or rales.  Skin:    General: Skin is warm and dry.  Neurological:     Mental Status: She is alert.  Psychiatric:        Speech: Speech normal.        Behavior: Behavior normal.        Thought Content: Thought content normal.

## 2024-11-05 NOTE — Telephone Encounter (Signed)
 Received Provider portion ([employer]) for Breztri  & Farxiga  PAP application.

## 2024-11-09 ENCOUNTER — Encounter: Payer: Self-pay | Admitting: Pharmacist

## 2024-11-09 ENCOUNTER — Other Ambulatory Visit
Admission: RE | Admit: 2024-11-09 | Discharge: 2024-11-09 | Disposition: A | Discharge status: Home / Self Care | Attending: Family | Admitting: Family

## 2024-11-09 DIAGNOSIS — Z794 Long term (current) use of insulin: Secondary | ICD-10-CM | POA: Insufficient documentation

## 2024-11-09 DIAGNOSIS — E1165 Type 2 diabetes mellitus with hyperglycemia: Secondary | ICD-10-CM | POA: Insufficient documentation

## 2024-11-09 DIAGNOSIS — I10 Essential (primary) hypertension: Secondary | ICD-10-CM | POA: Diagnosis not present

## 2024-11-09 LAB — HEMOGLOBIN A1C
Hgb A1c MFr Bld: 7.4 % — ABNORMAL HIGH (ref 4.8–5.6)
Mean Plasma Glucose: 165.68 mg/dL

## 2024-11-09 LAB — LIPID PANEL
Cholesterol: 144 mg/dL (ref 0–200)
HDL: 53 mg/dL (ref >40)
LDL Cholesterol: 75 mg/dL (ref 0–99)
Total CHOL/HDL Ratio: 2.7 ratio
Triglycerides: 83 mg/dL (ref <150)
VLDL: 17 mg/dL (ref 0–40)

## 2024-11-09 LAB — TSH: TSH: 0.573 u[IU]/mL (ref 0.350–4.500)

## 2024-11-09 NOTE — Progress Notes (Signed)
 Pharmacy Quality Measure Review  This patient is appearing on a report for being at risk of failing the adherence measure for hypertension (ACEi/ARB) medications this calendar year.   Medication: telmisartan /hydrochlorothiazide  Last fill date: 06/29/24 for 90 day supply  Contacted pharmacy to facilitate refills.

## 2024-11-10 LAB — MICROALBUMIN / CREATININE URINE RATIO
Creatinine, Urine: 105.5 mg/dL
Microalb Creat Ratio: 49 mg/g{creat} — ABNORMAL HIGH (ref 0–29)
Microalb, Ur: 51.3 ug/mL — ABNORMAL HIGH

## 2024-11-13 ENCOUNTER — Ambulatory Visit: Payer: Self-pay | Admitting: Family

## 2024-11-13 DIAGNOSIS — R7309 Other abnormal glucose: Secondary | ICD-10-CM

## 2024-11-16 ENCOUNTER — Other Ambulatory Visit: Payer: Self-pay | Admitting: Family

## 2024-11-16 DIAGNOSIS — Z794 Long term (current) use of insulin: Secondary | ICD-10-CM

## 2024-11-16 DIAGNOSIS — E1169 Type 2 diabetes mellitus with other specified complication: Secondary | ICD-10-CM

## 2024-11-16 MED ORDER — FREESTYLE LIBRE 3 PLUS SENSOR MISC
3 refills | Status: AC
Start: 2024-11-16 — End: ?

## 2024-11-16 MED ORDER — LANTUS SOLOSTAR 100 UNIT/ML ~~LOC~~ SOPN: 22.0000 [IU] | PEN_INJECTOR | SUBCUTANEOUS | Status: AC

## 2024-11-16 NOTE — Telephone Encounter (Signed)
 Spoke to pt she states that she has cbg machine to check her sugars at home, but would like me to order the [place] sensor I have ordered labs and scheduled pt for repeat A1C and micro

## 2024-11-18 ENCOUNTER — Other Ambulatory Visit (INDEPENDENT_AMBULATORY_CARE_PROVIDER_SITE_OTHER)

## 2024-11-18 DIAGNOSIS — R7309 Other abnormal glucose: Secondary | ICD-10-CM

## 2024-11-19 ENCOUNTER — Encounter: Payer: Self-pay | Admitting: Gastroenterology

## 2024-11-19 ENCOUNTER — Ambulatory Visit

## 2024-11-19 VITALS — Ht 63.0 in | Wt 192.8 lb

## 2024-11-19 DIAGNOSIS — Z860100 Personal history of colon polyps, unspecified: Secondary | ICD-10-CM

## 2024-11-19 DIAGNOSIS — Z800 Family history of malignant neoplasm of digestive organs: Secondary | ICD-10-CM

## 2024-11-19 LAB — MICROALBUMIN / CREATININE URINE RATIO
Creatinine,U: 78.4 mg/dL
Microalb Creat Ratio: 33.5 mg/g — ABNORMAL HIGH (ref 0.0–30.0)
Microalb, Ur: 2.6 mg/dL — ABNORMAL HIGH (ref 0.0–1.9)

## 2024-11-19 LAB — HEMOGLOBIN A1C: Hgb A1c MFr Bld: 7.7 % — ABNORMAL HIGH (ref 4.6–6.5)

## 2024-11-19 MED ORDER — NA SULFATE-K SULFATE-MG SULF 17.5-3.13-1.6 GM/177ML PO SOLN: 1.0000 | Freq: Once | ORAL | 0 refills | Status: AC

## 2024-11-19 NOTE — Progress Notes (Signed)
 No egg or soy allergy known to patient  No issues known to pt with past sedation with any surgeries or procedures Patient denies ever being told they had issues or difficulty with intubation  No FH of Malignant Hyperthermia  Pt is on diet pills-Semaglutide  for weight loss and hold instructions provided  Pt is not on  home 02  Pt is not on blood thinners  Pt denies issues with constipation  No A fib or A flutter Have any cardiac testing pending--No Pt can ambulate  Pt denies use of chewing tobacco Discussed diabetic I weight loss medication holds Discussed NSAID holds Checked BMI Pt instructed to use Single[employer] or GoodRx for a price reduction on prep  Patient's chart reviewed by Norleen Schillings CNRA prior to previsit and patient appropriate for the [facility].  Pre visit completed and red dot placed by patient's name on their procedure day (on provider's schedule).

## 2024-11-23 ENCOUNTER — Encounter: Payer: Self-pay | Admitting: Pulmonary Disease

## 2024-11-23 ENCOUNTER — Ambulatory Visit: Admitting: Pulmonary Disease

## 2024-11-23 VITALS — BP 130/76 | HR 73 | Temp 97.6°F | Ht 63.0 in | Wt 192.8 lb

## 2024-11-23 DIAGNOSIS — J4489 Other specified chronic obstructive pulmonary disease: Secondary | ICD-10-CM

## 2024-11-23 DIAGNOSIS — J3089 Other allergic rhinitis: Secondary | ICD-10-CM

## 2024-11-23 DIAGNOSIS — J4540 Moderate persistent asthma, uncomplicated: Secondary | ICD-10-CM

## 2024-11-23 DIAGNOSIS — G4733 Obstructive sleep apnea (adult) (pediatric): Secondary | ICD-10-CM | POA: Diagnosis not present

## 2024-11-23 NOTE — Progress Notes (Unsigned)
 Subjective:    Patient ID: Vanessa Higgins, female    DOB: [birth year], 73 y.o.   MRN: [id number]  Patient Care Team: Dineen Rollene MATSU, FNP as PCP - General (Family Medicine) End, Lonni, MD as PCP - Cardiology (Cardiology) Pa, [facility])  Chief Complaint  Patient presents with   Asthma    Shortness of breath on exertion.     BACKGROUND/INTERVAL:Vanessa Higgins is a 73 year old lifelong never smoker who presents for follow-up of asthma with chronic asthmatic bronchitis. This is a scheduled visit.  We last saw her on 10 June 2024. She has been on Breztri  and that she is doing well with this medication.  She has not had any asthmatic bronchitis exacerbations since last seen.    HPI Discussed the use of AI scribe software for clinical note transcription with the patient, who gave verbal consent to proceed.  History of Present Illness   Vanessa Higgins is a 73 year old female who presents for follow-up on asthma/asthmatic bronchitis and OSA reporting CPAP mask issues.  She experiences discomfort from her current CPAP mask, which rubs her nose. Her current mask covers both her nose and mouth, and she is interested in trying a different style that might be more comfortable, such as a nasal pillow. She uses her CPAP every night and takes it with her when traveling.  She is currently using a nasal mask provided by [employer], who frequently contacts her for reorders. She is due to reorder supplies and is exploring options that might prevent the irritation on her nose.  She has a significant indentation over the bridge of her nose from fullface mask.  She reports her breathing is good overall and her cough is okay. She uses her CPAP consistently and reports sleeping well with it.   She is compliant with Breztri  and notes control of her asthmatic bronchitic symptoms with this medication.  DATA 11/01/2021 PFTs: FEV1 1.25 L or 59% predicted, FVC 2.23 L or 79% predicted, FEV1/FVC  56%.  No significant bronchodilator response.  Lung volumes show mild hyperinflation and air trapping.  There is significant reduction in airway resistance postbronchodilator.  Diffusion capacity normal.  Compared to PFTs 05/12/2020, no significant change. 06/02/2024 PFTs: FEV1 1.03 L or 50% predicted, FVC 2.10 L or 76% predicted, FEV1/FVC 49%, there is a dramatic bronchodilator response with FEV1 improving to 1.33 L or 64% predicted for a 28% net change.  Lung volumes show hyperinflation and air trapping.  Diffusion capacity normal.  Consistent with severe obstructive airways disease of the asthmatic type.  Since prior study bronchodilator response is now noted to be significant.  Review of Systems A 10 point review of systems was performed and it is as noted above otherwise negative.   Patient Active Problem List   Diagnosis Date Noted   Aortic valve regurgitation 06/07/2024   Mitral valve insufficiency 06/07/2024   Candiduria 11/09/2023   CAD (coronary artery disease) 06/16/2023   Cerebral aneurysm 08/02/2022   Headache 07/10/2022   Palpitations 03/07/2022   Anxiety and depression    Osteopenia 08/31/2021   Lower respiratory tract infection due to 2019 novel coronavirus 08/2020   Hyperlipidemia associated with type 2 diabetes mellitus (HCC) 07/14/2020   Low serum vitamin B12 07/14/2020   Shortness of breath 05/05/2020   Left foot pain 09/01/2019   H/O adenomatous polyp of colon 03/03/2019   Total knee replacement status 03/03/2019   Depression, major, single episode, complete remission 11/18/2018   HNP (herniated  nucleus pulposus), lumbar 02/06/2018   Fatty liver disease, nonalcoholic 10/15/2017   GERD (gastroesophageal reflux disease) 10/15/2017   PVC's (premature ventricular contractions) 08/01/2017   OSA (obstructive sleep apnea) 04/21/2017   Vertigo 03/26/2017   Irregular heart rate 06/05/2016   Vitamin D  deficiency 12/02/2014   IDA (iron deficiency anemia) 10/20/2014    Routine general medical examination at a health care facility 05/03/2013   Diabetes mellitus (HCC) 07/09/2012   Essential hypertension 07/09/2012   Hyperlipidemia LDL goal <70 07/09/2012   Asthma 07/09/2012   Class 1 obesity due to excess calories with body mass index (BMI) of 33.0 to 33.9 in adult 07/09/2012    Social History   Tobacco Use   Smoking status: Never   Smokeless tobacco: Never  Substance Use Topics   Alcohol use: No    Allergies  Allergen Reactions   Sulfa Antibiotics Hives, Shortness Of Breath and Rash   Lisinopril Cough   Metformin  And Related Diarrhea    Stopped in 2025   Ramipril  Cough   Ozempic  (0.25 Or 0.5 Mg-Dose) [Semaglutide (0.25 Or 0.5mg -Dos)] Nausea And Vomiting    Nausea, vomiting on high doses    Current Meds  Medication Sig   albuterol  (VENTOLIN  HFA) 108 (90 Base) MCG/ACT inhaler TAKE 2 PUFFS BY MOUTH EVERY 6 HOURS AS NEEDED FOR WHEEZE OR SHORTNESS OF BREATH   aspirin  EC 81 MG tablet Take 1 tablet (81 mg total) by mouth daily. Swallow whole.   atorvastatin  (LIPITOR) 20 MG tablet TAKE 1 TABLET BY MOUTH EVERYDAY AT BEDTIME   BD PEN NEEDLE MICRO U/F 32G X 6 MM MISC USE 1 PEN NEEDLE DAILY   budesonide-glycopyrrolate -formoterol  (BREZTRI  AEROSPHERE) 160-9-4.8 MCG/ACT AERO inhaler Inhale 2 puffs into the lungs in the morning and at bedtime.   Cholecalciferol  (VITAMIN D ) 125 MCG (5000 UT) CAPS Take 5,000 Units by mouth every Monday,Wednesday,Friday, and Sunday at 6 PM AND 10,000 Units every Tuesday, Thursday, and Saturday at 6 PM.   Continuous Glucose Sensor (FREESTYLE LIBRE 3 PLUS SENSOR) MISC Change sensor every 15 days.   dapagliflozin  propanediol (FARXIGA ) 10 MG TABS tablet Take 1 tablet (10 mg total) by mouth daily before breakfast.   diltiazem  (CARDIZEM  CD) 240 MG 24 hr capsule TAKE 1 CAPSULE BY MOUTH EVERY DAY   diphenoxylate -atropine  (LOMOTIL ) 2.5-0.025 MG tablet Take 1 tablet by mouth every 6 (six) hours as needed for diarrhea or loose stools.    glucose blood (ONETOUCH VERIO) test strip Use as instructed   insulin  glargine (LANTUS  SOLOSTAR) 100 UNIT/ML Solostar Pen Inject 22 Units into the skin every morning.   loratadine  (CLARITIN ) 10 MG tablet Take 10 mg by mouth at bedtime.    montelukast  (SINGULAIR ) 10 MG tablet TAKE 1 TABLET BY MOUTH EVERYDAY AT BEDTIME   ondansetron  (ZOFRAN -ODT) 4 MG disintegrating tablet Take 1 tablet (4 mg total) by mouth every 8 (eight) hours as needed for nausea or vomiting.   pantoprazole  (PROTONIX ) 20 MG tablet TAKE 1 TABLET (20 MG TOTAL) BY MOUTH EVERY MORNING. TAKE 30 MINUTES TO HOUR BEFORE BREAKFAST   Probiotic Product (PROBIOTIC PO) Take 1 capsule by mouth daily.   Semaglutide ,0.25 or 0.5MG /DOS, (OZEMPIC , 0.25 OR 0.5 MG/DOSE,) 2 MG/1.5ML SOPN Inject 1 mg into the skin once a week.   telmisartan -hydrochlorothiazide (MICARDIS  HCT) 80-12.5 MG tablet TAKE 1 TABLET BY MOUTH EVERY DAY   venlafaxine  XR (EFFEXOR -XR) 150 MG 24 hr capsule TAKE 1 CAPSULE BY MOUTH DAILY WITH BREAKFAST.   vitamin B-12 (CYANOCOBALAMIN ) 1000 MCG tablet Take 1,000 mcg by  mouth at bedtime.    Immunization History  Administered Date(s) Administered   Fluad Quad(high Dose 65+) 10/02/2019   INFLUENZA, HIGH DOSE SEASONAL PF 10/06/2023, 10/08/2024   Influenza-Unspecified 09/02/2014, 10/02/2015, 09/28/2020, 09/28/2021   MMR 07/09/2010   PFIZER Comirnaty (Gray Top)Covid-19 Tri-Sucrose Vaccine 12/02/2019, 01/04/2020, 12/14/2020, 06/01/2021   PFIZER(Purple Top)SARS-COV-2 Vaccination 12/02/2019, 12/21/2019, 01/04/2020, 01/11/2020, 12/14/2020   PNEUMOCOCCAL CONJUGATE-20 08/19/2022   Pfizer Covid-19 Vaccine Bivalent Booster 64yrs & up 11/28/2021   Pfizer(Comirnaty )Fall Seasonal Vaccine 12 years and older 10/22/2022, 09/12/2023   Pneumococcal Conjugate-13 12/02/2014   Pneumococcal Polysaccharide-23 07/09/2001, 10/31/2006, 12/04/2016   Respiratory Syncytial Virus Vaccine,Recomb Aduvanted(Arexvy) 11/01/2022   Td 07/09/2010   Tdap 11/09/2009,  08/22/2021   Zoster Recombinant(Shingrix ) 03/23/2022, 06/29/2022   Zoster, Live 10/27/2013        Objective:     Vitals:   11/23/24 1413  BP: 130/76  Pulse: 73  Temp: 97.6 F (36.4 C)  Height: 5' 3 (1.6 m)  Weight: 192 lb 12.8 oz (87.5 kg)  SpO2: 95%  TempSrc: Temporal  BMI (Calculated): 34.16     SpO2: 95 %  GENERAL: This is a well-developed obese woman in no acute respiratory distress.  She is fully ambulatory. No conversational dyspnea. HEAD: Normocephalic, atraumatic. EYES: Pupils equal, round, reactive to light.  No scleral icterus. MOUTH: Dentition intact, oral mucosa moist.  No thrush. NECK: Supple. No thyromegaly. No nodules. No JVD.  Trachea midline.  No crepitus. PULMONARY: Symmetrical air entry.  Moving air well.  No adventitious sounds. CARDIOVASCULAR: S1 and S2. Regular rate and rhythm.  No rubs murmurs gallops heard. GASTROINTESTINAL: Obese abdomen, soft. MUSCULOSKELETAL: No joint deformity, no clubbing, no edema.  Increased AP diameter. NEUROLOGIC: No focal deficits noted.  Speech is fluent.  Awake, alert, oriented. SKIN: Intact,warm,dry, limited exam: No rashes.  Irritation from CPAP mask over bridge of nose skin. PSYCH: Normal mood and behavior.   Assessment & Plan:     ICD-10-CM   1. Moderate persistent asthma without complication  J45.40     2. Asthmatic bronchitis , chronic (HCC)  J44.89     3. Perennial allergic rhinitis  J30.89     4. OSA (obstructive sleep apnea)  G47.33      Discussion:    Moderate persistent asthma/asthmatic bronchitis Asthma is well-managed. - Continue Breztri  2 puffs twice a day. - Continue as needed albuterol .  Obstructive sleep apnea Managed with CPAP therapy. Current nasal mask causes nasal irritation. She is compliant with nightly use and travel use. - Provided sample of Airfit F30i fullface/hybrid mask to alleviate nasal irritation. - Instructed on mask cleaning and maintenance. - Will schedule follow-up  in three months to assess mask effectiveness and comfort. - Will order mask through [employer] if satisfactory.  - Patient was fitted during the visit and was pleased with the mask    Advised if symptoms do not improve or worsen, to please contact office for sooner follow up or seek emergency care.    I spent 36 minutes of dedicated to the care of this patient on the date of this encounter to include pre-visit review of records, face-to-face time with the patient discussing conditions above, post visit ordering of testing, clinical documentation with the electronic health record, making appropriate referrals as documented, and communicating necessary findings to members of the patients care team.     C. Leita Sanders, MD Advanced Bronchoscopy PCCM [facility]    *This note was generated using voice recognition software/Dragon and/or AI transcription program.  Despite best  efforts to proofread, errors can occur which can change the meaning. Any transcriptional errors that result from this process are unintentional and may not be fully corrected at the time of dictation.

## 2024-11-23 NOTE — Patient Instructions (Signed)
 VISIT SUMMARY:  Today, you were seen for issues related to your CPAP mask. You mentioned that your current mask causes discomfort by rubbing your nose, and you are interested in trying a different style. You also reported that your breathing and cough are good, and you are using your CPAP consistently and sleeping well with it.  YOUR PLAN:  -MODERATE PERSISTENT ASTHMA: Your asthma is well-managed, which means your symptoms are under control and not causing significant problems in your daily life.  -OBSTRUCTIVE SLEEP APNEA: Obstructive sleep apnea is a condition where your breathing stops and starts during sleep. It is currently managed with CPAP therapy. To address the nasal irritation caused by your current mask, you were provided with a sample of the Airfit F30i hybrid mask in M size. You were also instructed on how to clean and maintain the mask. We will follow up in three months to see if the new mask is effective and comfortable for you. If it works well, we will order it through [employer]  INSTRUCTIONS:  We will schedule a follow-up appointment in three months to assess the effectiveness and comfort of the new CPAP mask.

## 2024-11-24 ENCOUNTER — Encounter: Payer: Self-pay | Admitting: Pulmonary Disease

## 2024-11-29 NOTE — Telephone Encounter (Signed)
 Reached out to Patient regarding PAP application for Breztri  & Farxiga - had to leave HIPAA compliant v/m for return call to me for any assistance.

## 2024-12-01 ENCOUNTER — Ambulatory Visit: Payer: Self-pay | Admitting: Family

## 2024-12-01 NOTE — Telephone Encounter (Signed)
 PAP: Patient assistance application for Breztri  and Farxiga  has been approved by PAP Companies: [employer] from 12/30/2024 to 12/29/2025. Medication should be delivered to PAP Delivery: Home. For further shipping updates, please contact [employer] ([employer]) at [phone]. Patient ID is: [id number]

## 2024-12-03 ENCOUNTER — Ambulatory Visit: Admitting: Gastroenterology

## 2024-12-03 ENCOUNTER — Encounter: Payer: Self-pay | Admitting: Gastroenterology

## 2024-12-03 VITALS — BP 145/68 | HR 72 | Temp 97.7°F | Resp 15 | Ht 63.0 in | Wt 192.8 lb

## 2024-12-03 DIAGNOSIS — J45909 Unspecified asthma, uncomplicated: Secondary | ICD-10-CM | POA: Diagnosis not present

## 2024-12-03 DIAGNOSIS — Z800 Family history of malignant neoplasm of digestive organs: Secondary | ICD-10-CM | POA: Diagnosis not present

## 2024-12-03 DIAGNOSIS — Z860100 Personal history of colon polyps, unspecified: Secondary | ICD-10-CM | POA: Diagnosis not present

## 2024-12-03 DIAGNOSIS — D123 Benign neoplasm of transverse colon: Secondary | ICD-10-CM

## 2024-12-03 DIAGNOSIS — I10 Essential (primary) hypertension: Secondary | ICD-10-CM | POA: Diagnosis not present

## 2024-12-03 DIAGNOSIS — J449 Chronic obstructive pulmonary disease, unspecified: Secondary | ICD-10-CM | POA: Diagnosis not present

## 2024-12-03 DIAGNOSIS — K640 First degree hemorrhoids: Secondary | ICD-10-CM | POA: Diagnosis not present

## 2024-12-03 DIAGNOSIS — D122 Benign neoplasm of ascending colon: Secondary | ICD-10-CM | POA: Diagnosis not present

## 2024-12-03 DIAGNOSIS — K5730 Diverticulosis of large intestine without perforation or abscess without bleeding: Secondary | ICD-10-CM | POA: Diagnosis not present

## 2024-12-03 DIAGNOSIS — Z1211 Encounter for screening for malignant neoplasm of colon: Secondary | ICD-10-CM | POA: Diagnosis not present

## 2024-12-03 DIAGNOSIS — I2510 Atherosclerotic heart disease of native coronary artery without angina pectoris: Secondary | ICD-10-CM | POA: Diagnosis not present

## 2024-12-03 DIAGNOSIS — G4733 Obstructive sleep apnea (adult) (pediatric): Secondary | ICD-10-CM | POA: Diagnosis not present

## 2024-12-03 MED ORDER — SODIUM CHLORIDE 0.9 % IV SOLN: 500.0000 mL | Freq: Once | INTRAVENOUS | Status: AC

## 2024-12-03 NOTE — Patient Instructions (Signed)
 YOU HAD AN ENDOSCOPIC PROCEDURE TODAY AT THE  ENDOSCOPY CENTER:   Refer to the procedure report that was given to you for any specific questions about what was found during the examination.  If the procedure report does not answer your questions, please call your gastroenterologist to clarify.  If you requested that your care partner not be given the details of your procedure findings, then the procedure report has been included in a sealed envelope for you to review at your convenience later.  YOU SHOULD EXPECT: Some feelings of bloating in the abdomen. Passage of more gas than usual.  Walking can help get rid of the air that was put into your GI tract during the procedure and reduce the bloating. If you had a lower endoscopy (such as a colonoscopy or flexible sigmoidoscopy) you may notice spotting of blood in your stool or on the toilet paper. If you underwent a bowel prep for your procedure, you may not have a normal bowel movement for a few days.  Please Note:  You might notice some irritation and congestion in your nose or some drainage.  This is from the oxygen used during your procedure.  There is no need for concern and it should clear up in a day or so.  SYMPTOMS TO REPORT IMMEDIATELY:  Following lower endoscopy (colonoscopy or flexible sigmoidoscopy):  Excessive amounts of blood in the stool  Significant tenderness or worsening of abdominal pains  Swelling of the abdomen that is new, acute  Fever of 100F or higher   For urgent or emergent issues, a gastroenterologist can be reached at any hour by calling [phone]. Do not use MyChart messaging for urgent concerns.    DIET:  We do recommend a small meal at first, but then you may proceed to your regular diet.  Drink plenty of fluids but you should avoid alcoholic beverages for 24 hours.  MEDICATIONS: Continue present medications.  Educational handouts given to patient: Polyps, Diverticulosis, Hemorrhoids.  FOLLOW UP:  Repeat colonoscopy is not recommended for surveillance.  Thank you for allowing us  to provide for your healthcare needs today.  ACTIVITY:  You should plan to take it easy for the rest of today and you should NOT DRIVE or use heavy machinery until tomorrow (because of the sedation medicines used during the test).    FOLLOW UP: Our staff will call the number listed on your records the next business day following your procedure.  We will call around 7:15- 8:00 am to check on you and address any questions or concerns that you may have regarding the information given to you following your procedure. If we do not reach you, we will leave a message.     If any biopsies were taken you will be contacted by phone or by letter within the next 1-3 weeks.  Please call us  at [phone] if you have not heard about the biopsies in 3 weeks.    SIGNATURES/CONFIDENTIALITY: You and/or your care partner have signed paperwork which will be entered into your electronic medical record.  These signatures attest to the fact that that the information above on your After Visit Summary has been reviewed and is understood.  Full responsibility of the confidentiality of this discharge information lies with you and/or your care-partner.

## 2024-12-03 NOTE — Progress Notes (Signed)
Pt. states no medical or surgical changes since previsit or office visit. 

## 2024-12-03 NOTE — Op Note (Signed)
 [facility] Patient Name: Vanessa Higgins Procedure Date: 12/03/2024 8:52 AM MRN: [id number] Endoscopist: Lynnie Bring , MD, [id number] Age: 73 Referring MD:  Date of Birth: 05-Apr-1951 Gender: Female Account #: [id number] Procedure:                Colonoscopy Indications:              High risk colon cancer surveillance: Personal                            history of colonic polyps Medicines:                Monitored Anesthesia Care Procedure:                Pre-Anesthesia Assessment:                           - Prior to the procedure, a History and Physical                            was performed, and patient medications and                            allergies were reviewed. The patient's tolerance of                            previous anesthesia was also reviewed. The risks                            and benefits of the procedure and the sedation                            options and risks were discussed with the patient.                            All questions were answered, and informed consent                            was obtained. Prior Anticoagulants: The patient has                            taken no anticoagulant or antiplatelet agents. ASA                            Grade Assessment: II - A patient with mild systemic                            disease. After reviewing the risks and benefits,                            the patient was deemed in satisfactory condition to                            undergo the procedure.  After obtaining informed consent, the colonoscope                            was passed under direct vision. Throughout the                            procedure, the patient's blood pressure, pulse, and                            oxygen saturations were monitored continuously. The                            Olympus Scope SN: [id number] was introduced through                            the anus and advanced to the 2 cm into  the ileum.                            The colonoscopy was performed without difficulty.                            The patient tolerated the procedure well. The                            quality of the bowel preparation was excellent. The                            terminal ileum, ileocecal valve, appendiceal                            orifice, and rectum were photographed. Scope In: 9:13:11 AM Scope Out: 9:29:11 AM Scope Withdrawal Time: 0 hours 9 minutes 6 seconds  Total Procedure Duration: 0 hours 16 minutes 0 seconds  Findings:                 Two sessile polyps were found in the proximal                            transverse colon and mid ascending colon. The                            polyps were 4 to 5 mm in size. These polyps were                            removed with a cold snare. Resection and retrieval                            were complete.                           A tattoo was seen in the mid transverse colon. The                            tattoo site  appeared normal. This area was                            carefully examined.                           Multiple medium-mouthed diverticula were found in                            the sigmoid colon. Rare diverticula in the                            ascending colon and few in the descending colon.                           Non-bleeding internal hemorrhoids were found during                            retroflexion. The hemorrhoids were small and Grade                            I (internal hemorrhoids that do not prolapse).                           The terminal ileum appeared normal.                           Retroflexion in the right colon was performed.                           The exam was otherwise without abnormality on                            direct and retroflexion views. Complications:            No immediate complications. Estimated Blood Loss:     Estimated blood loss: none. Impression:               -  Two 4 to 5 mm polyps in the proximal transverse                            colon and in the mid ascending colon, removed with                            a cold snare. Resected and retrieved.                           - A tattoo was seen in the mid transverse colon.                            The tattoo site appeared normal.                           - Moderate predominantly sigmoid diverticulosis.                           -  Non-bleeding internal hemorrhoids.                           - The examined portion of the ileum was normal.                           - The examination was otherwise normal on direct                            and retroflexion views. Recommendation:           - Patient has a contact number available for                            emergencies. The signs and symptoms of potential                            delayed complications were discussed with the                            patient. Return to normal activities tomorrow.                            Written discharge instructions were provided to the                            patient.                           - Resume previous diet.                           - Continue present medications.                           - Await pathology results.                           - Repeat colonoscopy is not recommended for                            surveillance.                           - The findings and recommendations were discussed                            with the patient's family. Lynnie Bring, MD 12/03/2024 9:34:44 AM This report has been signed electronically.

## 2024-12-03 NOTE — Progress Notes (Signed)
 Called to room to assist during endoscopic procedure.  Patient ID and intended procedure confirmed with present staff. Received instructions for my participation in the procedure from the performing physician.

## 2024-12-03 NOTE — Progress Notes (Signed)
 Report to PACU, RN, vss, BBS= Clear.

## 2024-12-03 NOTE — Progress Notes (Signed)
 [facility] History and Physical   Primary Care Physician:  Dineen Rollene MATSU, FNP   Reason for Procedure:    history of colon polyps  Plan:      Colonoscopy    The patient was provided an opportunity to ask questions and all were answered. The patient agreed with the plan.   HPI: Vanessa Higgins is a 73 y.o. female   diarrhea has resolved ever since she has stopped metformin .  Past Medical History:  Diagnosis Date   Allergy    hay fever   Anemia    Ankle fracture, left 04/08/2014   Anxiety and depression    Arthritis    Asthma    well controlled    Back pain    Blood transfusion abn reaction or complication, no procedure mishap    Blood transfusion without reported diagnosis    Cerebral aneurysm    Chest pain    Cholelithiasis without cholecystitis    COPD (chronic obstructive pulmonary disease) (HCC)    Coronary artery disease    COVID-19 virus infection 08/2020   Depression    Diabetes mellitus    Disc displacement, lumbar    Dysrhythmia    pvc's   Fatty liver    GERD (gastroesophageal reflux disease)    Headache(784.0)    History of hiatal hernia    small   Hyperlipidemia    Hypertension    Joint pain    Low vitamin B12 level    Low vitamin D  level    Migraine    Mitral valve prolapse    Obstructive sleep apnea    CPAP   Osteoarthritis    Palpitations    PONV (postoperative nausea and vomiting)    h/o in the past   Sleep apnea 04/06/2017   uses CPAP   SOB (shortness of breath)    UTI (lower urinary tract infection)    Vertigo    1 episode, several years ago   Vitamin B 12 deficiency    Vitamin D  deficiency    Wears glasses     Past Surgical History:  Procedure Laterality Date   APPENDECTOMY  1977   CATARACT EXTRACTION W/PHACO Right 02/19/2023   Procedure: CATARACT EXTRACTION PHACO AND INTRAOCULAR LENS PLACEMENT (IOC) RIGHT DIABETIC  7.13  00:46.7;  Surgeon: Mittie Gaskin, MD;  Location: [facility];  Service:  Ophthalmology;  Laterality: Right;  Diabetic   CATARACT EXTRACTION W/PHACO Left 03/05/2023   Procedure: CATARACT EXTRACTION PHACO AND INTRAOCULAR LENS PLACEMENT (IOC) LEFT DIABETIC  6.03  00:36.4;  Surgeon: Mittie Gaskin, MD;  Location: [facility];  Service: Ophthalmology;  Laterality: Left;  Diabetic   CHOLECYSTECTOMY     06/2024   COLONOSCOPY  last 11/17/2014   INDOCYANINE GREEN  FLUORESCENCE IMAGING (ICG)  07/05/2024   Procedure: INDOCYANINE GREEN  FLUORESCENCE IMAGING (ICG);  Surgeon: Rodolph Romano, MD;  Location: [facility];  Service: General;;   INGUINAL HERNIA REPAIR Right 11/01/2020   Procedure: HERNIA REPAIR INGUINAL ADULT;  Surgeon: Dessa Reyes ORN, MD;  Location: [facility];  Service: General;  Laterality: Right;   JOINT REPLACEMENT     KNEE ARTHROPLASTY Right 03/03/2019   Procedure: COMPUTER ASSISTED TOTAL KNEE ARTHROPLASTY-RIGHT;  Surgeon: Mardee Lynwood SQUIBB, MD;  Location: [facility];  Service: Orthopedics;  Laterality: Right;   LUMBAR LAMINECTOMY/DECOMPRESSION MICRODISCECTOMY Right 02/06/2018   Procedure: Lumbar two Hemilaminectomy for Discectomy;  Surgeon: Gillie Duncans, MD;  Location: [facility];  Service: Neurosurgery;  Laterality: Right;   ORIF ANKLE FRACTURE Left 04/08/2014  Procedure: OPEN REDUCTION INTERNAL FIXATION (ORIF) ANKLE FRACTURE LEFT ANKLE FRACTURE OPEN TREATMENT BIMALLEOLAR ANKLE INCLUDES INTERNAL FIXATION;  Surgeon: Fonda SHAUNNA Olmsted, MD;  Location: [facility];  Service: Orthopedics;  Laterality: Left;   PAROTIDECTOMY  1980   left   POLYPECTOMY     TONSILLECTOMY AND ADENOIDECTOMY     as a child   TOTAL KNEE ARTHROPLASTY Bilateral    left   TUBAL LIGATION  05/1976    Prior to Admission medications   Medication Sig Start Date End Date Taking? Authorizing Provider  atorvastatin  (LIPITOR) 20 MG tablet TAKE 1 TABLET BY MOUTH EVERYDAY AT BEDTIME 07/25/24  Yes Arnett, Rollene MATSU, FNP  budesonide-glycopyrrolate -formoterol  (BREZTRI   AEROSPHERE) 160-9-4.8 MCG/ACT AERO inhaler Inhale 2 puffs into the lungs in the morning and at bedtime. 08/26/24  Yes Dineen Rollene MATSU, FNP  dapagliflozin  propanediol (FARXIGA ) 10 MG TABS tablet Take 1 tablet (10 mg total) by mouth daily before breakfast. 08/26/24  Yes Arnett, Rollene MATSU, FNP  diltiazem  (CARDIZEM  CD) 240 MG 24 hr capsule TAKE 1 CAPSULE BY MOUTH EVERY DAY 07/21/24  Yes End, Lonni, MD  insulin  glargine (LANTUS  SOLOSTAR) 100 UNIT/ML Solostar Pen Inject 22 Units into the skin every morning. 11/16/24  Yes Arnett, Rollene MATSU, FNP  loratadine  (CLARITIN ) 10 MG tablet Take 10 mg by mouth at bedtime.    Yes [provider]  montelukast  (SINGULAIR ) 10 MG tablet TAKE 1 TABLET BY MOUTH EVERYDAY AT BEDTIME 06/07/24  Yes Arnett, Rollene MATSU, FNP  ondansetron  (ZOFRAN -ODT) 4 MG disintegrating tablet Take 1 tablet (4 mg total) by mouth every 8 (eight) hours as needed for nausea or vomiting. 06/25/24  Yes Mumma, Clotilda, MD  pantoprazole  (PROTONIX ) 20 MG tablet TAKE 1 TABLET (20 MG TOTAL) BY MOUTH EVERY MORNING. TAKE 30 MINUTES TO HOUR BEFORE BREAKFAST 06/07/24  Yes Dineen Rollene MATSU, FNP  Probiotic Product (PROBIOTIC PO) Take 1 capsule by mouth daily.   Yes [provider]  telmisartan -hydrochlorothiazide (MICARDIS  HCT) 80-12.5 MG tablet TAKE 1 TABLET BY MOUTH EVERY DAY 07/25/24  Yes Dineen Rollene MATSU, FNP  venlafaxine  XR (EFFEXOR -XR) 150 MG 24 hr capsule TAKE 1 CAPSULE BY MOUTH DAILY WITH BREAKFAST. 07/25/24 07/25/25 Yes Dineen Rollene MATSU, FNP  albuterol  (VENTOLIN  HFA) 108 (90 Base) MCG/ACT inhaler TAKE 2 PUFFS BY MOUTH EVERY 6 HOURS AS NEEDED FOR WHEEZE OR SHORTNESS OF BREATH 12/30/23   Tamea Dedra CROME, MD  aspirin  EC 81 MG tablet Take 1 tablet (81 mg total) by mouth daily. Swallow whole. 05/16/23   Furth, Cadence H, PA-C  BD PEN NEEDLE MICRO U/F 32G X 6 MM MISC USE 1 PEN NEEDLE DAILY 11/24/23   Dineen Rollene MATSU, FNP  Cholecalciferol  (VITAMIN D ) 125 MCG (5000 UT) CAPS Take 5,000  Units by mouth every Monday,Wednesday,Friday, and Sunday at 6 PM AND 10,000 Units every Tuesday, Thursday, and Saturday at 6 PM. 07/10/23   [place] Adelita PENNER, MD  Continuous Glucose Sensor (FREESTYLE LIBRE 3 PLUS SENSOR) MISC Change sensor every 15 days. 11/16/24   Dineen Rollene MATSU, FNP  diphenoxylate -atropine  (LOMOTIL ) 2.5-0.025 MG tablet Take 1 tablet by mouth every 6 (six) hours as needed for diarrhea or loose stools. 11/30/21   Charlanne Groom, MD  glucose blood [facility] VERIO) test strip Use as instructed 08/06/24   Dineen Rollene MATSU, FNP  Semaglutide ,0.25 or 0.5MG /DOS, (OZEMPIC , 0.25 OR 0.5 MG/DOSE,) 2 MG/1.5ML SOPN Inject 1 mg into the skin once a week. 11/04/24   Dineen Rollene MATSU, FNP  vitamin B-12 (CYANOCOBALAMIN ) 1000 MCG  tablet Take 1,000 mcg by mouth at bedtime.    [provider]    Current Outpatient Medications  Medication Sig Dispense Refill   atorvastatin  (LIPITOR) 20 MG tablet TAKE 1 TABLET BY MOUTH EVERYDAY AT BEDTIME 90 tablet 3   budesonide-glycopyrrolate -formoterol  (BREZTRI  AEROSPHERE) 160-9-4.8 MCG/ACT AERO inhaler Inhale 2 puffs into the lungs in the morning and at bedtime. 3 each 3   dapagliflozin  propanediol (FARXIGA ) 10 MG TABS tablet Take 1 tablet (10 mg total) by mouth daily before breakfast. 90 tablet 3   diltiazem  (CARDIZEM  CD) 240 MG 24 hr capsule TAKE 1 CAPSULE BY MOUTH EVERY DAY 90 capsule 3   insulin  glargine (LANTUS  SOLOSTAR) 100 UNIT/ML Solostar Pen Inject 22 Units into the skin every morning.     loratadine  (CLARITIN ) 10 MG tablet Take 10 mg by mouth at bedtime.      montelukast  (SINGULAIR ) 10 MG tablet TAKE 1 TABLET BY MOUTH EVERYDAY AT BEDTIME 90 tablet 1   ondansetron  (ZOFRAN -ODT) 4 MG disintegrating tablet Take 1 tablet (4 mg total) by mouth every 8 (eight) hours as needed for nausea or vomiting. 30 tablet 0   pantoprazole  (PROTONIX ) 20 MG tablet TAKE 1 TABLET (20 MG TOTAL) BY MOUTH EVERY MORNING. TAKE 30 MINUTES TO HOUR BEFORE BREAKFAST 90  tablet 3   Probiotic Product (PROBIOTIC PO) Take 1 capsule by mouth daily.     telmisartan -hydrochlorothiazide (MICARDIS  HCT) 80-12.5 MG tablet TAKE 1 TABLET BY MOUTH EVERY DAY 90 tablet 3   venlafaxine  XR (EFFEXOR -XR) 150 MG 24 hr capsule TAKE 1 CAPSULE BY MOUTH DAILY WITH BREAKFAST. 90 capsule 3   albuterol  (VENTOLIN  HFA) 108 (90 Base) MCG/ACT inhaler TAKE 2 PUFFS BY MOUTH EVERY 6 HOURS AS NEEDED FOR WHEEZE OR SHORTNESS OF BREATH 18 each 11   aspirin  EC 81 MG tablet Take 1 tablet (81 mg total) by mouth daily. Swallow whole. 90 tablet 3   BD PEN NEEDLE MICRO U/F 32G X 6 MM MISC USE 1 PEN NEEDLE DAILY 100 each 3   Cholecalciferol  (VITAMIN D ) 125 MCG (5000 UT) CAPS Take 5,000 Units by mouth every Monday,Wednesday,Friday, and Sunday at 6 PM AND 10,000 Units every Tuesday, Thursday, and Saturday at 6 PM.     Continuous Glucose Sensor (FREESTYLE LIBRE 3 PLUS SENSOR) MISC Change sensor every 15 days. 6 each 3   diphenoxylate -atropine  (LOMOTIL ) 2.5-0.025 MG tablet Take 1 tablet by mouth every 6 (six) hours as needed for diarrhea or loose stools. 30 tablet 1   glucose blood (ONETOUCH VERIO) test strip Use as instructed 100 strip 3   Semaglutide ,0.25 or 0.5MG /DOS, (OZEMPIC , 0.25 OR 0.5 MG/DOSE,) 2 MG/1.5ML SOPN Inject 1 mg into the skin once a week. 3 mL 3   vitamin B-12 (CYANOCOBALAMIN ) 1000 MCG tablet Take 1,000 mcg by mouth at bedtime.     Current Facility-Administered Medications  Medication Dose Route Frequency Provider Last Rate Last Admin   0.9 %  sodium chloride  infusion  500 mL Intravenous Once Charlanne Groom, MD        Allergies as of 12/03/2024 - Review Complete 12/03/2024  Allergen Reaction Noted   Sulfa antibiotics Hives, Shortness Of Breath, and Rash 07/09/2012   Lisinopril Cough 05/10/2020   Metformin  and related Diarrhea 11/04/2024   Ramipril  Cough 08/28/2016   Ozempic  (0.25 or 0.5 mg-dose) [semaglutide (0.25 or 0.5mg -dos)] Nausea And Vomiting 06/16/2023    Family History  Problem  Relation Age of Onset   Sudden death Mother    Cancer Mother 86  pancreatic   Heart disease Father 66   Sudden death Father    Cancer Sister 63       brain   Cancer Brother        colon   Colon cancer Brother 50   Stroke Maternal Grandmother    Heart disease Maternal Grandfather    Aneurysm Paternal Aunt    Breast cancer Neg Hx    Colon polyps Neg Hx    Esophageal cancer Neg Hx    Rectal cancer Neg Hx    Stomach cancer Neg Hx    Thyroid  cancer Neg Hx     Social History   Socioeconomic History   Marital status: Widowed    Spouse name: Not on file   Number of children: Not on file   Years of education: Not on file   Highest education level: Associate degree: occupational, scientist, product/process development, or vocational program  Occupational History   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 Husband deceased 2008-12-06.      Work - [employer] endoscopy- [employer]   Diet - healthy   Exercise - gym 2 x per week   Social Drivers of Health   Financial Resource Strain: Low Risk  (11/02/2024)   Overall Financial Resource Strain (CARDIA)    Difficulty of Paying Living Expenses: Not very hard  Food Insecurity: No Food Insecurity (11/02/2024)   Hunger Vital Sign    Worried About Running Out of Food in the Last Year: Never true    Ran Out of Food in the Last Year: Never true  Transportation Needs: No Transportation Needs (11/02/2024)   PRAPARE - Administrator, Civil Service (Medical): No    Lack of Transportation (Non-Medical): No  Physical Activity: Insufficiently Active (11/02/2024)   Exercise Vital Sign    Days of Exercise per Week: 2 days    Minutes of Exercise  per Session: 20 min  Stress: No Stress Concern Present (11/02/2024)   [employer] of Occupational Health - Occupational Stress Questionnaire    Feeling of Stress: Not at all  Social Connections: Moderately Integrated (11/02/2024)   Social Connection and Isolation Panel    Frequency of Communication with Friends and Family: More than three times a week    Frequency of Social Gatherings with Friends and Family: Three times a week    Attends Religious Services: More than 4 times per year    Active Member of Clubs or Organizations: Yes    Attends Banker Meetings: 1 to 4 times per year    Marital Status: Widowed  Intimate Partner Violence: Not At Risk (06/23/2024)   Humiliation, Afraid, Rape, and Kick questionnaire    Fear of Current or Ex-Partner: No    Emotionally Abused: No    Physically Abused: No    Sexually Abused: No    Review of Systems: Positive for none All other review of systems negative except as mentioned in the HPI.  Physical Exam: Vital signs in last 24 hours: @VSRANGES @   General:   Alert,  Well-developed, well-nourished, pleasant and cooperative in NAD Lungs:  Clear throughout to auscultation.  Heart:  Regular rate and rhythm; no murmurs, clicks, rubs,  or gallops. Abdomen:  Soft, nontender and nondistended. Normal bowel sounds.   Neuro/Psych:  Alert and cooperative. Normal mood and affect. A and O x 3    No significant changes were identified.  The patient continues to be an appropriate candidate for the planned procedure and anesthesia.   Anselm Bring, MD. [facility] 12/03/2024 3:14 PM@

## 2024-12-04 ENCOUNTER — Other Ambulatory Visit: Payer: Self-pay | Admitting: Family

## 2024-12-04 DIAGNOSIS — J45909 Unspecified asthma, uncomplicated: Secondary | ICD-10-CM

## 2024-12-06 ENCOUNTER — Telehealth: Payer: Self-pay

## 2024-12-06 NOTE — Telephone Encounter (Signed)
 Left message on follow up call.

## 2024-12-07 LAB — SURGICAL PATHOLOGY

## 2024-12-11 ENCOUNTER — Ambulatory Visit: Payer: Self-pay | Admitting: Gastroenterology

## 2024-12-27 ENCOUNTER — Telehealth: Payer: Self-pay

## 2024-12-27 DIAGNOSIS — Z794 Long term (current) use of insulin: Secondary | ICD-10-CM

## 2024-12-27 NOTE — Telephone Encounter (Signed)
 Copied from CRM #[id number]. Topic: Clinical - Prescription Issue >> Dec 27, 2024  3:59 PM Vanessa Higgins wrote: Reason for CRM: Patient called regarding the Semaglutide ,0.25 or 0.5MG /DOS, (OZEMPIC , 0.25 OR 0.5 MG/DOSE,) 2 MG/1.5ML SOPN  - she said she needs the 1MG  sent to her pharmacy on file. She is due for the injection Saturday.

## 2024-12-28 MED ORDER — OZEMPIC (0.25 OR 0.5 MG/DOSE) 2 MG/1.5ML ~~LOC~~ SOPN: 1.0000 mg | PEN_INJECTOR | SUBCUTANEOUS | 3 refills | Status: DC

## 2024-12-28 NOTE — Addendum Note (Signed)
 Addended by: Amarylis Rovito on: 12/28/2024 08:22 AM   Modules accepted: Orders

## 2024-12-31 ENCOUNTER — Other Ambulatory Visit: Payer: Self-pay | Admitting: Family

## 2024-12-31 DIAGNOSIS — E1165 Type 2 diabetes mellitus with hyperglycemia: Secondary | ICD-10-CM

## 2025-01-03 ENCOUNTER — Other Ambulatory Visit: Payer: Self-pay | Admitting: Family

## 2025-01-03 MED ORDER — SEMAGLUTIDE (1 MG/DOSE) 4 MG/3ML ~~LOC~~ SOPN: 1.0000 mg | PEN_INJECTOR | SUBCUTANEOUS | 3 refills | Status: AC

## 2025-01-10 ENCOUNTER — Ambulatory Visit: Payer: Self-pay

## 2025-01-10 NOTE — Telephone Encounter (Signed)
 FYI Only or Action Required?: Action required by provider: clinical question for provider.  Patient was last seen in primary care on 11/04/2024 by Dineen Rollene MATSU, FNP.  Called Nurse Triage reporting Blood Sugar Problem.  Symptoms began several weeks ago.  Interventions attempted: Prescription medications: Lantus .  Symptoms are: unchanged.  Triage Disposition: Home Care  Patient/caregiver understands and will follow disposition?: Yes  Reason for Disposition  Blood glucose 70-240 mg/dL (3.9 -86.6 mmol/L)  Answer Assessment - Initial Assessment Questions Patient is calling in because she has noticed her blood sugars have been running around 200 even without eating. She mentions recent medication changes and is just inquiring on whether she should come in for an appt. She is not currently experiencing any symptoms. Home care advised per triage. Patient states it is best to reach her at her cell phone if needed.   1. BLOOD GLUCOSE: What is your blood glucose level?      Currently 191  2. ONSET: When did you check the blood glucose?     During Triage call  3. USUAL RANGE: What is your glucose level usually? (e.g., usual fasting morning value, usual evening value)     Recently has been around 200  4. KETONES: Do you check for ketones (urine or blood test strips)? If Yes, ask: What does the test show now?      No  5. TYPE 1 or 2:  Do you know what type of diabetes you have?  (e.g., Type 1, Type 2, Gestational; doesn't know)      Type 2  6. INSULIN : Do you take insulin ? What type of insulin (s) do you use? What is the mode of delivery? (syringe, pen; injection or pump)?      Yes, Lantus  pen  7. DIABETES PILLS: Do you take any pills for your diabetes? If Yes, ask: Have you missed taking any pills recently?     States that she used to take Metformin  but discontinued in September  8. OTHER SYMPTOMS: Do you have any symptoms? (e.g., fever, frequent urination,  difficulty breathing, dizziness, weakness, vomiting)     Denies any symptoms  9. PREGNANCY: Is there any chance you are pregnant? When was your last menstrual period?     NA  Protocols used: Diabetes - High Blood Sugar-A-AH  Copied from CRM #[id number]. Topic: Clinical - Red Word Triage >> Jan 10, 2025  4:58 PM Roselie BROCKS wrote: Red Word that prompted transfer to Nurse Triage: Patient states she is having trouble with her blood sugar staying high, its around 200 but she feels bad.

## 2025-01-11 NOTE — Telephone Encounter (Signed)
 LVM on pt cellphone to call back to office to get her scheduled for appt . Ok to schedule when pt calls back and document

## 2025-01-12 NOTE — Telephone Encounter (Signed)
 Pt has appt scheduled for 01/18/25

## 2025-01-18 ENCOUNTER — Encounter: Payer: Self-pay | Admitting: Family

## 2025-01-18 ENCOUNTER — Ambulatory Visit (INDEPENDENT_AMBULATORY_CARE_PROVIDER_SITE_OTHER): Admitting: Family

## 2025-01-18 VITALS — BP 128/68 | HR 86 | Temp 97.4°F | Ht 63.0 in | Wt 188.2 lb

## 2025-01-18 DIAGNOSIS — E1169 Type 2 diabetes mellitus with other specified complication: Secondary | ICD-10-CM

## 2025-01-18 DIAGNOSIS — E1165 Type 2 diabetes mellitus with hyperglycemia: Secondary | ICD-10-CM

## 2025-01-18 DIAGNOSIS — Z794 Long term (current) use of insulin: Secondary | ICD-10-CM

## 2025-01-18 DIAGNOSIS — E785 Hyperlipidemia, unspecified: Secondary | ICD-10-CM | POA: Diagnosis not present

## 2025-01-18 MED ORDER — METFORMIN HCL ER 500 MG PO TB24: 500.0000 mg | ORAL_TABLET | Freq: Two times a day (BID) | ORAL | 2 refills | Status: AC

## 2025-01-18 NOTE — Assessment & Plan Note (Addendum)
 Reviewed CGM data GMI 7.6 Average glucose 179   Continue ozempic  1mg , Lantus  22 units daily, Farxiga  10 mg.  Resume meformin 500mg  BID by starting every day to monitor for GI upset. She will let me know how she is doing and share CGM data with me as needed.  Counseled on signs and symptoms of hypoglycemia.

## 2025-01-18 NOTE — Progress Notes (Signed)
 "  Assessment & Plan:  Type 2 diabetes mellitus with hyperglycemia, with long-term current use of insulin  (HCC) -     metFORMIN  HCl ER; Take 1 tablet (500 mg total) by mouth 2 (two) times daily.  Dispense: 180 tablet; Refill: 2 -     Amb Referral to Nutrition and Diabetic Education  Hyperlipidemia associated with type 2 diabetes mellitus (HCC) Assessment & Plan: Reviewed CGM data GMI 7.6 Average glucose 179   Continue ozempic  1mg , Lantus  22 units daily, Farxiga  10 mg.  Resume meformin 500mg  BID by starting every day to monitor for GI upset. She will let me know how she is doing and share CGM data with me as needed.  Counseled on signs and symptoms of hypoglycemia.         Return precautions given.   Risks, benefits, and alternatives of the medications and treatment plan prescribed today were discussed, and patient expressed understanding.   Education regarding symptom management and diagnosis given to patient on AVS either electronically or printed.  Return in about 6 weeks (around 03/01/2025).  [name], FNP  Subjective:    Patient ID: Vanessa Higgins, female    DOB: Aug 27, 1951, 74 y.o.   MRN: [id number]  CC: Vanessa Higgins is a 74 y.o. female who presents today for follow up.   HPI: HPI Discussed the use of AI scribe software for clinical note transcription with the patient, who gave verbal consent to proceed.  History of Present Illness   Vanessa Higgins is a 74 year old female with diabetes who presents with elevated blood sugar levels.  She has been experiencing elevated blood sugar levels in the last couple weeks, with two readings reaching up to 300 mg/dL. Her continuous glucose monitor shows spikes over 250 mg/dL with a meal .  She is not waiting 2 hours after manage to check her blood sugar.  Fasting blood sugars in the morning are typically between 150-170 mg/dL, with occasional readings of 140 mg/dL.  She experienced a low blood sugar reading of 60 mg/dL during  the night, which was the first low she has experienced. This occurred around 2 AM, and she was alerted by the monitor's alarm. She does not recall any specific dietary intake that might have contributed to this low.   She has been off metformin  due to gastrointestinal side effects, specifically diarrhea, which worsened after her gallbladder removal. She is currently on Ozempic  1 mg, Lantus  22 units, and Farxiga  10 mg.  Denies blurry vision. She has been trying to manage her diet, avoiding sweets, and notes a specific incident where a small bowl of potato soup caused a significant spike in her blood sugar.    Last seen by nephrology 01/05/2025 for hypertension, proteinuria, CKD Allergies: Sulfa antibiotics, Lisinopril, Metformin  and related, Ramipril , and Ozempic  (0.25 or 0.5 mg-dose) [semaglutide (0.25 or 0.5mg -dos)] Medications Ordered Prior to Encounter[1]  Review of Systems  Constitutional:  Negative for chills and fever.  Respiratory:  Negative for cough.   Cardiovascular:  Negative for chest pain and palpitations.  Gastrointestinal:  Negative for nausea and vomiting.      Objective:    BP 128/68   Pulse 86   Temp (!) 97.4 F (36.3 C) (Oral)   Ht 5' 3 (1.6 m)   Wt 188 lb 3.2 oz (85.4 kg)   SpO2 95%   BMI 33.34 kg/m  BP Readings from Last 3 Encounters:  01/18/25 128/68  12/03/24 (!) 145/68  11/23/24 130/76   Wt  Readings from Last 3 Encounters:  01/18/25 188 lb 3.2 oz (85.4 kg)  12/03/24 192 lb 12.8 oz (87.5 kg)  11/23/24 192 lb 12.8 oz (87.5 kg)    Physical Exam Vitals reviewed.  Constitutional:      Appearance: She is well-developed.  Eyes:     Conjunctiva/sclera: Conjunctivae normal.  Cardiovascular:     Rate and Rhythm: Normal rate and regular rhythm.     Pulses: Normal pulses.     Heart sounds: Normal heart sounds.  Pulmonary:     Effort: Pulmonary effort is normal.     Breath sounds: Normal breath sounds. No wheezing, rhonchi or rales.  Skin:    General:  Skin is warm and dry.  Neurological:     Mental Status: She is alert.  Psychiatric:        Speech: Speech normal.        Behavior: Behavior normal.        Thought Content: Thought content normal.            [1]  Current Outpatient Medications on File Prior to Visit  Medication Sig Dispense Refill   albuterol  (VENTOLIN  HFA) 108 (90 Base) MCG/ACT inhaler TAKE 2 PUFFS BY MOUTH EVERY 6 HOURS AS NEEDED FOR WHEEZE OR SHORTNESS OF BREATH 18 each 11   aspirin  EC 81 MG tablet Take 1 tablet (81 mg total) by mouth daily. Swallow whole. 90 tablet 3   atorvastatin  (LIPITOR) 20 MG tablet TAKE 1 TABLET BY MOUTH EVERYDAY AT BEDTIME 90 tablet 3   BD PEN NEEDLE MICRO U/F 32G X 6 MM MISC USE 1 PEN NEEDLE DAILY 100 each 3   budesonide-glycopyrrolate -formoterol  (BREZTRI  AEROSPHERE) 160-9-4.8 MCG/ACT AERO inhaler Inhale 2 puffs into the lungs in the morning and at bedtime. 3 each 3   Cholecalciferol  (VITAMIN D ) 125 MCG (5000 UT) CAPS Take 5,000 Units by mouth every Monday,Wednesday,Friday, and Sunday at 6 PM AND 10,000 Units every Tuesday, Thursday, and Saturday at 6 PM.     Continuous Glucose Sensor (FREESTYLE LIBRE 3 PLUS SENSOR) MISC Change sensor every 15 days. 6 each 3   dapagliflozin  propanediol (FARXIGA ) 10 MG TABS tablet Take 1 tablet (10 mg total) by mouth daily before breakfast. 90 tablet 3   diltiazem  (CARDIZEM  CD) 240 MG 24 hr capsule TAKE 1 CAPSULE BY MOUTH EVERY DAY 90 capsule 3   diphenoxylate -atropine  (LOMOTIL ) 2.5-0.025 MG tablet Take 1 tablet by mouth every 6 (six) hours as needed for diarrhea or loose stools. 30 tablet 1   glucose blood (ONETOUCH VERIO) test strip Use as instructed 100 strip 3   insulin  glargine (LANTUS  SOLOSTAR) 100 UNIT/ML Solostar Pen Inject 22 Units into the skin every morning.     loratadine  (CLARITIN ) 10 MG tablet Take 10 mg by mouth at bedtime.      montelukast  (SINGULAIR ) 10 MG tablet TAKE 1 TABLET BY MOUTH EVERYDAY AT BEDTIME 90 tablet 3   ondansetron   (ZOFRAN -ODT) 4 MG disintegrating tablet Take 1 tablet (4 mg total) by mouth every 8 (eight) hours as needed for nausea or vomiting. 30 tablet 0   pantoprazole  (PROTONIX ) 20 MG tablet TAKE 1 TABLET (20 MG TOTAL) BY MOUTH EVERY MORNING. TAKE 30 MINUTES TO HOUR BEFORE BREAKFAST 90 tablet 3   Probiotic Product (PROBIOTIC PO) Take 1 capsule by mouth daily.     Semaglutide , 1 MG/DOSE, 4 MG/3ML SOPN Inject 1 mg as directed once a week. 3 mL 3   telmisartan -hydrochlorothiazide (MICARDIS  HCT) 80-12.5 MG tablet TAKE 1 TABLET BY  MOUTH EVERY DAY 90 tablet 3   venlafaxine  XR (EFFEXOR -XR) 150 MG 24 hr capsule TAKE 1 CAPSULE BY MOUTH DAILY WITH BREAKFAST. 90 capsule 3   vitamin B-12 (CYANOCOBALAMIN ) 1000 MCG tablet Take 1,000 mcg by mouth at bedtime.     Current Facility-Administered Medications on File Prior to Visit  Medication Dose Route Frequency Provider Last Rate Last Admin   0.9 %  sodium chloride  infusion  500 mL Intravenous Once Charlanne Groom, MD       "

## 2025-01-18 NOTE — Patient Instructions (Addendum)
 Referral to nutrition  Restart metformin  500mg  once daily  for 3-7 days  Then increase to metformin  500mg  daily twice daily.   Goal of fasting blood sugar is between 70-120. If in this range, we are reaching our target a1c ( goal 6.5%)   Please check fasting blood sugar in the morning time once or twice a week.  You may also check if you feel like you are having a low episode or particularly high episode of blood sugar.  If blood sugars increase, I may advise you to check blood sugar after your largest meal.  You specifically do this TWO hours after largest meal with the goal of being less than 180.  If blood sugar is checked sooner than 2 hours after largest meal, and it will be  expected to be elevated. You must wait 2 hours.   If your blood sugar is less than 180 hours after your largest meal, again we are reaching our target a1c goal   Call [facility] clinic if: BG < 70 or > 300.   If you have any symptoms of low blood sugar ( sweating, shakiness, lightheaded, dizzy) that you notify me. If you have a low, please drink a glass of orange juice and recheck blood sugar every 5 minutes until you dont feel symptomatic AND blood sugar is above 80.

## 2025-01-26 ENCOUNTER — Telehealth: Payer: Self-pay

## 2025-01-26 NOTE — Telephone Encounter (Signed)
 Copied from CRM #[id number]. Topic: Clinical - Prescription Issue >> Jan 26, 2025  1:58 PM Burnard DEL wrote: Reason for CRM: [facility] called in stating that there was no directions on prescription for budesonide-glycopyrrolate -formoterol  (BREZTRI  AEROSPHERE) 160-9-4.8 MCG/ACT AERO inhaler that they received on 11/04/2024.

## 2025-01-28 NOTE — Telephone Encounter (Signed)
 Called pt son answered and stated she wasn't available. I stated ill call back in the afternoon

## 2025-01-28 NOTE — Telephone Encounter (Signed)
 Called pt she is aware of the directions. Pt also stated things are going well now that she is on metformin 

## 2025-02-09 ENCOUNTER — Encounter: Admitting: Dietician

## 2025-02-24 ENCOUNTER — Ambulatory Visit: Admitting: Pulmonary Disease

## 2025-03-10 ENCOUNTER — Ambulatory Visit: Admitting: Family

## 2025-06-28 ENCOUNTER — Ambulatory Visit
